# Patient Record
Sex: Female | Born: 1975 | Hispanic: No | Smoking: Former smoker
Health system: Southern US, Community
[De-identification: ages and names within clinical notes are randomized; demographics above are authoritative.]

## PROBLEM LIST (undated history)

## (undated) DIAGNOSIS — R87619 Unspecified abnormal cytological findings in specimens from cervix uteri: Secondary | ICD-10-CM

## (undated) DIAGNOSIS — Q068 Other specified congenital malformations of spinal cord: Secondary | ICD-10-CM

## (undated) DIAGNOSIS — R87611 Atypical squamous cells cannot exclude high grade squamous intraepithelial lesion on cytologic smear of cervix (ASC-H): Secondary | ICD-10-CM | POA: Insufficient documentation

## (undated) DIAGNOSIS — B977 Papillomavirus as the cause of diseases classified elsewhere: Secondary | ICD-10-CM

## (undated) DIAGNOSIS — D649 Anemia, unspecified: Secondary | ICD-10-CM

## (undated) DIAGNOSIS — C539 Malignant neoplasm of cervix uteri, unspecified: Secondary | ICD-10-CM

## (undated) DIAGNOSIS — B009 Herpesviral infection, unspecified: Secondary | ICD-10-CM

## (undated) DIAGNOSIS — Z87440 Personal history of urinary (tract) infections: Secondary | ICD-10-CM

## (undated) DIAGNOSIS — E538 Deficiency of other specified B group vitamins: Secondary | ICD-10-CM

## (undated) DIAGNOSIS — Q057 Lumbar spina bifida without hydrocephalus: Secondary | ICD-10-CM

## (undated) HISTORY — DX: Herpesviral infection, unspecified: B00.9

## (undated) HISTORY — DX: Deficiency of other specified B group vitamins: E53.8

## (undated) HISTORY — DX: Malignant neoplasm of cervix uteri, unspecified: C53.9

## (undated) HISTORY — PX: BLADDER SURGERY: SHX569

## (undated) HISTORY — PX: EXPLORATORY LAPAROTOMY: SUR591

## (undated) HISTORY — PX: LEEP: SHX91

## (undated) HISTORY — DX: Unspecified abnormal cytological findings in specimens from cervix uteri: R87.619

## (undated) HISTORY — DX: Other specified congenital malformations of spinal cord: Q06.8

## (undated) HISTORY — DX: Papillomavirus as the cause of diseases classified elsewhere: B97.7

## (undated) HISTORY — PX: COLOSTOMY: SHX63

## (undated) HISTORY — DX: Personal history of urinary (tract) infections: Z87.440

## (undated) HISTORY — DX: Lumbar spina bifida without hydrocephalus: Q05.7

## (undated) HISTORY — PX: BACK SURGERY: SHX140

## (undated) HISTORY — PX: OTHER SURGICAL HISTORY: SHX169

## (undated) HISTORY — PX: TUBAL LIGATION: SHX77

## (undated) HISTORY — DX: Anemia, unspecified: D64.9

---

## 2001-02-23 ENCOUNTER — Other Ambulatory Visit: Admission: RE | Admit: 2001-02-23 | Discharge: 2001-02-23 | Payer: Self-pay

## 2001-02-23 ENCOUNTER — Other Ambulatory Visit: Admission: RE | Admit: 2001-02-23 | Discharge: 2001-02-23 | Payer: Self-pay | Admitting: *Deleted

## 2007-01-23 ENCOUNTER — Emergency Department: Payer: Self-pay

## 2007-03-15 ENCOUNTER — Encounter: Payer: Self-pay | Admitting: Maternal & Fetal Medicine

## 2007-04-14 ENCOUNTER — Encounter: Payer: Self-pay | Admitting: Maternal & Fetal Medicine

## 2007-04-15 ENCOUNTER — Encounter: Payer: Self-pay | Admitting: Maternal & Fetal Medicine

## 2007-08-09 ENCOUNTER — Encounter: Payer: Self-pay | Admitting: Maternal & Fetal Medicine

## 2007-09-01 ENCOUNTER — Inpatient Hospital Stay: Payer: Self-pay

## 2008-12-17 ENCOUNTER — Emergency Department (HOSPITAL_COMMUNITY): Admission: EM | Admit: 2008-12-17 | Discharge: 2008-12-18 | Payer: Self-pay | Admitting: Emergency Medicine

## 2009-01-10 ENCOUNTER — Other Ambulatory Visit: Admission: RE | Admit: 2009-01-10 | Discharge: 2009-01-10 | Payer: Self-pay | Admitting: Obstetrics and Gynecology

## 2009-05-08 ENCOUNTER — Emergency Department (HOSPITAL_COMMUNITY): Admission: EM | Admit: 2009-05-08 | Discharge: 2009-05-08 | Payer: Self-pay | Admitting: Emergency Medicine

## 2009-07-16 ENCOUNTER — Emergency Department (HOSPITAL_COMMUNITY): Admission: EM | Admit: 2009-07-16 | Discharge: 2009-07-16 | Payer: Self-pay | Admitting: Emergency Medicine

## 2009-07-23 ENCOUNTER — Emergency Department (HOSPITAL_COMMUNITY): Admission: EM | Admit: 2009-07-23 | Discharge: 2009-07-23 | Payer: Self-pay | Admitting: Emergency Medicine

## 2009-08-17 ENCOUNTER — Ambulatory Visit (HOSPITAL_COMMUNITY): Admission: RE | Admit: 2009-08-17 | Discharge: 2009-08-17 | Payer: Self-pay | Admitting: Obstetrics and Gynecology

## 2009-11-12 ENCOUNTER — Emergency Department (HOSPITAL_COMMUNITY): Admission: EM | Admit: 2009-11-12 | Discharge: 2009-11-13 | Payer: Self-pay | Admitting: Emergency Medicine

## 2009-11-13 ENCOUNTER — Ambulatory Visit (HOSPITAL_COMMUNITY): Admission: RE | Admit: 2009-11-13 | Discharge: 2009-11-13 | Payer: Self-pay | Admitting: Emergency Medicine

## 2010-01-28 ENCOUNTER — Ambulatory Visit: Payer: Self-pay | Admitting: Orthopedic Surgery

## 2010-01-28 DIAGNOSIS — M549 Dorsalgia, unspecified: Secondary | ICD-10-CM | POA: Insufficient documentation

## 2010-01-30 ENCOUNTER — Encounter: Payer: Self-pay | Admitting: Orthopedic Surgery

## 2010-02-06 ENCOUNTER — Telehealth: Payer: Self-pay | Admitting: Orthopedic Surgery

## 2010-02-06 ENCOUNTER — Encounter (HOSPITAL_COMMUNITY): Admission: RE | Admit: 2010-02-06 | Discharge: 2010-03-08 | Payer: Self-pay | Admitting: Orthopedic Surgery

## 2010-02-19 ENCOUNTER — Encounter: Payer: Self-pay | Admitting: Orthopedic Surgery

## 2010-03-12 ENCOUNTER — Encounter (HOSPITAL_COMMUNITY)
Admission: RE | Admit: 2010-03-12 | Discharge: 2010-04-11 | Payer: Self-pay | Source: Home / Self Care | Attending: Orthopedic Surgery | Admitting: Orthopedic Surgery

## 2010-03-13 ENCOUNTER — Ambulatory Visit: Payer: Self-pay | Admitting: Orthopedic Surgery

## 2010-03-29 ENCOUNTER — Telehealth: Payer: Self-pay | Admitting: Orthopedic Surgery

## 2010-04-18 ENCOUNTER — Encounter: Payer: Self-pay | Admitting: Orthopedic Surgery

## 2010-04-23 ENCOUNTER — Ambulatory Visit
Admission: RE | Admit: 2010-04-23 | Discharge: 2010-04-23 | Payer: Self-pay | Source: Home / Self Care | Attending: Orthopedic Surgery | Admitting: Orthopedic Surgery

## 2010-04-23 ENCOUNTER — Emergency Department (HOSPITAL_COMMUNITY)
Admission: EM | Admit: 2010-04-23 | Discharge: 2010-04-23 | Payer: Self-pay | Source: Home / Self Care | Admitting: Emergency Medicine

## 2010-04-23 DIAGNOSIS — M5126 Other intervertebral disc displacement, lumbar region: Secondary | ICD-10-CM | POA: Insufficient documentation

## 2010-04-29 ENCOUNTER — Encounter (INDEPENDENT_AMBULATORY_CARE_PROVIDER_SITE_OTHER): Payer: Self-pay | Admitting: *Deleted

## 2010-04-29 LAB — URINALYSIS, ROUTINE W REFLEX MICROSCOPIC
Bilirubin Urine: NEGATIVE
Hgb urine dipstick: NEGATIVE
Ketones, ur: NEGATIVE mg/dL
Nitrite: NEGATIVE
Protein, ur: NEGATIVE mg/dL
Specific Gravity, Urine: 1.01 (ref 1.005–1.030)
Urine Glucose, Fasting: NEGATIVE mg/dL
pH: 6.5 (ref 5.0–8.0)

## 2010-05-02 ENCOUNTER — Ambulatory Visit (HOSPITAL_COMMUNITY)
Admission: RE | Admit: 2010-05-02 | Discharge: 2010-05-02 | Payer: Self-pay | Source: Home / Self Care | Attending: Orthopedic Surgery | Admitting: Orthopedic Surgery

## 2010-05-08 ENCOUNTER — Ambulatory Visit
Admission: RE | Admit: 2010-05-08 | Discharge: 2010-05-08 | Payer: Self-pay | Source: Home / Self Care | Attending: Orthopedic Surgery | Admitting: Orthopedic Surgery

## 2010-05-08 DIAGNOSIS — Q068 Other specified congenital malformations of spinal cord: Secondary | ICD-10-CM | POA: Insufficient documentation

## 2010-05-08 DIAGNOSIS — Q079 Congenital malformation of nervous system, unspecified: Secondary | ICD-10-CM | POA: Insufficient documentation

## 2010-05-13 ENCOUNTER — Encounter: Payer: Self-pay | Admitting: Orthopedic Surgery

## 2010-05-14 NOTE — Letter (Signed)
Summary: History form  History form   Imported By: Jacklynn Ganong 01/30/2010 08:35:55  _____________________________________________________________________  External Attachment:    Type:   Image     Comment:   External Document

## 2010-05-14 NOTE — Medication Information (Signed)
Summary: Tax adviser   Imported By: Cammie Sickle 01/29/2010 18:41:37  _____________________________________________________________________  External Attachment:    Type:   Image     Comment:   External Document

## 2010-05-14 NOTE — Miscellaneous (Signed)
Summary: Physical therapy order  Physical therapy order   Imported By: Cammie Sickle 01/29/2010 18:42:21  _____________________________________________________________________  External Attachment:    Type:   Image     Comment:   External Document

## 2010-05-14 NOTE — Assessment & Plan Note (Signed)
Summary: RE-CK LOW BACK/FOL'G PT/CA MCD/CAF   Visit Type:  Follow-up  CC:  back pain.  History of Present Illness: I saw Melissa Pruitt in the office today for a followup visit.  She is a 35 years old woman with the complaint of:  back pain.  Medications: Naproxen.  Still in PT.  c/o occasional bilateral leg pain with a tingling feeling in the feet   with Neg SLR's and normal reflexes   I will like her to finish her therapy. I saw the notes today. She is making some improvement, but is unclear to me at this time. I should continues to have a tingling feeling with normal neurologic exam.  If no improvement when she comes back next time. We will get an MRI. I did start her on some tramadol again do not feel narcotics necessary  Allergies: No Known Drug Allergies   Medications Added to Medication List This Visit: 1)  Tramadol Hcl 50 Mg Tabs (Tramadol hcl) .Marland Kitchen.. 1 by mouth q 4 as needed pain  Other Orders: Est. Patient Level II (30865)  Patient Instructions: 1)  Finish PT and return in 1 month  2)  Please schedule a follow-up appointment in 1 month. Prescriptions: TRAMADOL HCL 50 MG TABS (TRAMADOL HCL) 1 by mouth q 4 as needed pain  #60 x 5   Entered and Authorized by:   Fuller Canada MD   Signed by:   Fuller Canada MD on 03/13/2010   Method used:   Handwritten   RxID:   [id number]    Orders Added: 1)  Est. Patient Level II [40102]

## 2010-05-14 NOTE — Assessment & Plan Note (Signed)
Summary: [facility] ER FOL/UP/RT SIDE LOW BACK/NO XRAYS @AP /US,CT/MEDICAID/CAF   Visit Type:  new patient  CC:  low back pain.  History of Present Illness: this is the first visit to our office for the above named patient who she presented to the ER complaining of 2-1/2 years of back pain with bilateral leg pain radiating down into her feet.  She was able to tolerate things until she had her child and then things got worse.  During her pregnancy her pain increased but it was not reduced to the pregnancy itself.  However after the pregnancy was over she still had the symptoms and they worsen.  She now complains of 9/10 sharp dull throbbing stabbing burning constant pain which is worse with walking, bending over, lying down, driving sitting or standing.  Her pain is associated with numbness tingling a catching sensation when she flexes her back and occasional swelling.  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    Allergies (verified): No Known Drug Allergies  Past History:  Past Surgical History: Back surgery Bladder Colostomy Tubes tied Removal of cervical cancer  Review of Systems Constitutional:  Denies weight loss, weight gain, fever, chills, and fatigue. Cardiovascular:  Denies chest pain, palpitations, fainting, and murmurs. Respiratory:  Denies short of breath, wheezing, couch, tightness, pain on inspiration, and snoring . Gastrointestinal:  Denies heartburn, nausea, vomiting, diarrhea, constipation, and blood in your stools. Genitourinary:  Complains of flank pain; denies frequency, urgency, difficulty urinating, painful urination, and bleeding  in urine; right side. Neurologic:  Complains of numbness, tingling, and unsteady gait; denies dizziness, tremors, and seizure. Musculoskeletal:  Complains of instability and muscle pain; denies joint pain, swelling, stiffness, redness, and heat. Endocrine:  Denies excessive thirst, exessive urination, and heat or cold intolerance. Psychiatric:  Denies nervousness, depression, anxiety, and hallucinations. Skin:  Denies changes in the skin, poor healing, rash, itching, and redness. HEENT:  Denies blurred or double vision, eye pain, redness, and watering. Immunology:  Complains of seasonal allergies; denies sinus problems and allergic to bee stings; lactose intolerant. Hemoatologic:  Denies easy bleeding and brusing.  Physical Exam  Additional Exam:  GEN: well developed, well nourished, normal grooming and hygiene, no deformity and extremely small body habitus  Vital signs are normal.  CDV: pulses are normal, no edema, no erythema. no tenderness  Lymph: normal lymph nodes   Skin: no rashes, skin lesions or open sores   NEURO: normal coordination, reflexes, sensation.   Psyche: awake, alert and oriented. Mood normal   Gait: normal  She has a transverse incision in the lower back with a tender area where the incision is.  She has normal hip flexion bilaterally knee flexion ankle flexion and she has normal stability to her lower extremities.  Motor exam normal.  I do not feel a step-off or defect at the incision but it is tender.  When we tried to do straight leg raises she had pain in her back but not in her legs and it wasn't radicular   Impression & Recommendations:  Problem # 1:  BACK PAIN (ICD-724.5) Assessment New  patient status  post some type of meningocele type surgery is a 77-month-old child presents with bilateral leg pain numbness catching.  X-rays were taken I do not see a defect.  I think she should see a neurosurgeon so we will do the therapy start her on  anti-inflammatories and then get the MRI per Medicaid protocol.  AP lateral and spot films of the lumbar spine show some abnormality to the sagittal alignment of the spine but nothing that I would say scoliosis there is some suggestion that some type of surgery may have been done at L5-S1 but it does not appear to be a defect or spina bifida.  Strongly advised not to give this patient pain medication with narcotics.  Orders: New Patient Level III (16109) Lumbosacral Spine ,2/3 views (72100)  Patient Instructions: 1)  Physical Therapy  2)  return 6 weeks  3)  Nsaids x 6 weeks    Orders Added: 1)  New Patient Level III [60454] 2)  Lumbosacral Spine ,2/3 views [72100]

## 2010-05-14 NOTE — Progress Notes (Signed)
Summary: wants prescription for mild pain medicine  Phone Note Call from Patient   Summary of Call: Melissa Pruitt (20-Jul-1975) has ben taking Naproxen since  01/28/10 and it has not helped her pain.  Had PT evaluation today and is in alot of pain now.  Can you prescribe a mild pain medicine.  Uses  [facility] in [place]? Her # [phone] Initial call taken by: Jacklynn Ganong,  February 06, 2010 12:31 PM  Follow-up for Phone Call        try tylenol 1000 mg three times a day  Follow-up by: Fuller Canada MD,  February 07, 2010 8:22 AM  Additional Follow-up for Phone Call Additional follow up Details #1::        Advised the patient of doctor's reply Additional Follow-up by: Jacklynn Ganong,  February 07, 2010 8:28 AM

## 2010-05-14 NOTE — Miscellaneous (Signed)
Summary: PT progress note  PT progress note   Imported By: Jacklynn Ganong 03/14/2010 08:41:24  _____________________________________________________________________  External Attachment:    Type:   Image     Comment:   External Document

## 2010-05-14 NOTE — Miscellaneous (Signed)
Summary: PT initial evaluation  PT initial evaluation   Imported By: Jacklynn Ganong 02/19/2010 15:28:46  _____________________________________________________________________  External Attachment:    Type:   Image     Comment:   External Document

## 2010-05-16 NOTE — Assessment & Plan Note (Signed)
Summary: 1 mth follow-up after PT/medicaid/wkj   Visit Type:  Follow-up  CC:  back pain recheck.  History of Present Illness: I saw Melissa Pruitt in the office today for a followup visit.  She is a 35 years old woman with the complaint of:  back pain.  Medications: Naproxen, Robaxin.  Today is a one month recheck after therapy.  Therapy helped her side area, she can stand up straight now.  The muscle relaxer helps some.  Still has pain down her right leg.   Has left leg tingling also in the thigh area.    Allergies (verified): 1)  ! Ultram  Past History:  Past Surgical History: Last updated: 01/28/2010 Back surgery Bladder Colostomy Tubes tied Removal of cervical cancer  Review of Systems      See HPI   Impression & Recommendations:  Problem # 1:  BACK PAIN (ICD-724.5)  Her updated medication list for this problem includes:    Tramadol Hcl 50 Mg Tabs (Tramadol hcl) .Marland Kitchen... 1 by mouth q 4 as needed pain    Robaxin 500 Mg Tabs (Methocarbamol) .Marland Kitchen... 1 by mouth q 6 hrs as needed    Flexeril 10 Mg Tabs (Cyclobenzaprine hcl) .Marland Kitchen... 1 by mouth q 4 as needed pain  Orders: Est. Patient Level III (04540)  Problem # 2:  H N P-LUMBAR (ICD-722.10) Assessment: New  recommend MRI to evaluate persistent tingling and numbness in her legs with history of congenital spinal abnormality and failure of resolution with physical therapy, anti-inflammatories, and muscle relaxers  Orders: Est. Patient Level III (98119)  Medications Added to Medication List This Visit: 1)  Flexeril 10 Mg Tabs (Cyclobenzaprine hcl) .Marland Kitchen.. 1 by mouth q 4 as needed pain  Patient Instructions: 1)  MRI L SPINE  2)  CONTINUE MEDS AS NEEDED  3)  RETURN AFTER MRI  Prescriptions: FLEXERIL 10 MG TABS (CYCLOBENZAPRINE HCL) 1 by mouth Q 4 as needed PAIN  #60 x 1   Entered and Authorized by:   Fuller Canada MD   Signed by:   Fuller Canada MD on 04/23/2010   Method used:   Print then Give to Patient   RxID:   [id number]    Orders Added: 1)  Est. Patient Level III [84696]

## 2010-05-16 NOTE — Miscellaneous (Signed)
Summary: MRI appt 05/02/10  reg 830am aph results appt 05/08/10 3pm  Clinical Lists Changes        Precert number [id number] expires 05/29/10 for [place] medicaid, fu here for results on 05/08/10 at 3pm, advised to bring disc.[email or web address]

## 2010-05-16 NOTE — Miscellaneous (Signed)
Summary: PT discharge summary  PT discharge summary   Imported By: Jacklynn Ganong 04/18/2010 15:20:02  _____________________________________________________________________  External Attachment:    Type:   Image     Comment:   External Document

## 2010-05-16 NOTE — Progress Notes (Signed)
Summary: [facility] to [facility]  Phone Note Call from Patient   Caller: Patient Summary of Call: Patient stopped in office fol'g physical therapy & relayed that therapist mentioned that they need to a myofacial release each time at therapy, and mentioned she may want to request from Dr Romeo Apple a mild muscle relaxer.  Patient said had tried Tramadol and felt it was too strong and caused side effects.  Please advise re: mild muscle relaxer.  Her pharmacy is [facility] in [place].  Her ph # is [phone].  Initial call taken by: Cammie Sickle,  March 29, 2010 9:55 AM  Follow-up for Phone Call        [facility] to [facility] in [place] Follow-up by: Ether Griffins,  March 29, 2010 9:54 AM    New/Updated Medications: ROBAXIN 500 MG TABS (METHOCARBAMOL) 1 by mouth q 6 hrs as needed Prescriptions: ROBAXIN 500 MG TABS (METHOCARBAMOL) 1 by mouth q 6 hrs as needed  #60 x 1   Entered by:   Ether Griffins   Authorized by:   Fuller Canada MD   Signed by:   Ether Griffins on 03/29/2010   Method used:   Faxed to ...       [facility] (retail)       [address]       [place], [place]  [address]       Ph: [phone]       Fax: [phone]   RxID:   [id number]

## 2010-05-16 NOTE — Assessment & Plan Note (Signed)
Summary: mri results L spine [facility]/to bring [email or web address]   Visit Type:  Follow-up  CC:  back pain with tingling in her legs.  History of Present Illness: I saw Melissa Pruitt in the office today for a followup visit.  She is a 35 years old woman with the complaint of:  back pain and tingling in both legs   ROS neuro symptoms in the feet with tingling and spasms   Medications: Tramadol Hcl 50 Mg Tabs (Tramadol hcl) .Marland Kitchen... 1 by mouth q 4 as needed pain    Robaxin 500 Mg Tabs (Methocarbamol) .Marland Kitchen... 1 by mouth q 6 hrs as needed    Flexeril 10 Mg Tabs (Cyclobenzaprine hcl) .Marland Kitchen... 1 by mouth q 4 as needed pain  MRI results:  IMPRESSION:   1.  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 2.  Negative for degenerative disc disease.  Central canal and foramina are widely patent all levels.   Read By:  Charyl Dancer,  M.D.     Complaints: She states that she is still hurting, but the Flexeril helps better the Robaxin.  Allergies: 1)  ! 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   She continues to have symptoms of numbness and tingling in both legs and feet. Despite cord signal normal.  I think at minimum she should have a neurosurgical consult to see if the tethered cord and spinal dysraphism is a cause of her symptoms.  She is advised to continue Naprosyn and Flexeril.  Orders: Est. 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 Patient Level III (60454)  Other Orders: Neurosurgeon Referral (Neurosurgeon)  Patient Instructions: 1)  Neurosurgeon referral   Orders Added: 1)  Neurosurgeon Referral [Neurosurgeon] 2)  Est. Patient Level III [09811]

## 2010-05-22 NOTE — Miscellaneous (Signed)
Summary: [facility] referral  Clinical Lists Changes  Orders: Added new Referral order of Orthopedic Surgeon Referral (Ortho Surgeon) - Signed

## 2010-05-27 ENCOUNTER — Telehealth: Payer: Self-pay | Admitting: Orthopedic Surgery

## 2010-06-05 NOTE — Progress Notes (Signed)
Summary: Referrasl to [facility] for her back.  Phone Note Outgoing Call   Call placed by: Waldon Reining,  May 27, 2010 12:30 PM Call placed to: Specialist Action Taken: Information Sent Summary of Call: I faxed a referral for this patient to [facility] to be seen for her back because [facility] would not see her because of her Spina Bifida.

## 2010-06-12 ENCOUNTER — Other Ambulatory Visit: Payer: Self-pay | Admitting: Obstetrics and Gynecology

## 2010-06-12 ENCOUNTER — Other Ambulatory Visit (HOSPITAL_COMMUNITY)
Admission: RE | Admit: 2010-06-12 | Discharge: 2010-06-12 | Disposition: A | Discharge status: Home / Self Care | Payer: Medicaid Other | Source: Ambulatory Visit | Attending: Obstetrics and Gynecology | Admitting: Obstetrics and Gynecology

## 2010-06-12 DIAGNOSIS — Z01419 Encounter for gynecological examination (general) (routine) without abnormal findings: Secondary | ICD-10-CM | POA: Insufficient documentation

## 2010-06-19 ENCOUNTER — Encounter: Payer: Self-pay | Admitting: Orthopedic Surgery

## 2010-06-27 ENCOUNTER — Encounter: Payer: Self-pay | Admitting: Orthopedic Surgery

## 2010-06-28 LAB — URINALYSIS, ROUTINE W REFLEX MICROSCOPIC
Bilirubin Urine: NEGATIVE
Glucose, UA: NEGATIVE mg/dL
Hgb urine dipstick: NEGATIVE
Ketones, ur: NEGATIVE mg/dL
Nitrite: NEGATIVE
Protein, ur: NEGATIVE mg/dL
Specific Gravity, Urine: 1.01 (ref 1.005–1.030)
Urobilinogen, UA: 0.2 mg/dL (ref 0.0–1.0)
pH: 6 (ref 5.0–8.0)

## 2010-06-28 LAB — CBC
HCT: 36.6 % (ref 36.0–46.0)
Hemoglobin: 12.5 g/dL (ref 12.0–15.0)
MCH: 32.3 pg (ref 26.0–34.0)
MCHC: 34.2 g/dL (ref 30.0–36.0)
MCV: 94.4 fL (ref 78.0–100.0)
Platelets: 179 10*3/uL (ref 150–400)
RBC: 3.88 MIL/uL (ref 3.87–5.11)
RDW: 13.4 % (ref 11.5–15.5)
WBC: 7.7 10*3/uL (ref 4.0–10.5)

## 2010-06-28 LAB — LIPASE, BLOOD: Lipase: 40 U/L (ref 11–59)

## 2010-06-28 LAB — URINE CULTURE
Colony Count: NO GROWTH
Culture  Setup Time: 201108021944
Culture: NO GROWTH

## 2010-06-28 LAB — DIFFERENTIAL
Basophils Absolute: 0.1 10*3/uL (ref 0.0–0.1)
Basophils Relative: 1 % (ref 0–1)
Eosinophils Absolute: 0.1 10*3/uL (ref 0.0–0.7)
Eosinophils Relative: 2 % (ref 0–5)
Lymphocytes Relative: 43 % (ref 12–46)
Lymphs Abs: 3.3 10*3/uL (ref 0.7–4.0)
Monocytes Absolute: 0.4 10*3/uL (ref 0.1–1.0)
Monocytes Relative: 5 % (ref 3–12)
Neutro Abs: 3.8 10*3/uL (ref 1.7–7.7)
Neutrophils Relative %: 49 % (ref 43–77)

## 2010-06-28 LAB — COMPREHENSIVE METABOLIC PANEL
ALT: 12 U/L (ref 0–35)
AST: 14 U/L (ref 0–37)
Albumin: 4.3 g/dL (ref 3.5–5.2)
Alkaline Phosphatase: 31 U/L — ABNORMAL LOW (ref 39–117)
BUN: 6 mg/dL (ref 6–23)
CO2: 29 mEq/L (ref 19–32)
Calcium: 9.3 mg/dL (ref 8.4–10.5)
Chloride: 103 mEq/L (ref 96–112)
Creatinine, Ser: 0.61 mg/dL (ref 0.4–1.2)
GFR calc Af Amer: >60 mL/min (ref >60)
GFR calc non Af Amer: >60 mL/min (ref >60)
Glucose, Bld: 93 mg/dL (ref 70–99)
Potassium: 3.3 mEq/L — ABNORMAL LOW (ref 3.5–5.1)
Sodium: 137 mEq/L (ref 135–145)
Total Bilirubin: 0.8 mg/dL (ref 0.3–1.2)
Total Protein: 6.4 g/dL (ref 6.0–8.3)

## 2010-06-28 LAB — POCT PREGNANCY, URINE: Preg Test, Ur: NEGATIVE

## 2010-06-30 LAB — URINALYSIS, ROUTINE W REFLEX MICROSCOPIC
Bilirubin Urine: NEGATIVE
Glucose, UA: NEGATIVE mg/dL
Hgb urine dipstick: NEGATIVE
Ketones, ur: NEGATIVE mg/dL
Nitrite: POSITIVE — AB
Protein, ur: NEGATIVE mg/dL
Specific Gravity, Urine: 1.01 (ref 1.005–1.030)
Urobilinogen, UA: 0.2 mg/dL (ref 0.0–1.0)
pH: 8 (ref 5.0–8.0)

## 2010-06-30 LAB — URINE MICROSCOPIC-ADD ON

## 2010-06-30 LAB — PREGNANCY, URINE: Preg Test, Ur: NEGATIVE

## 2010-06-30 LAB — RAPID STREP SCREEN (MED CTR MEBANE ONLY): Streptococcus, Group A Screen (Direct): NEGATIVE

## 2010-07-02 LAB — CBC
HCT: 37.2 % (ref 36.0–46.0)
Hemoglobin: 13.1 g/dL (ref 12.0–15.0)
MCHC: 35.1 g/dL (ref 30.0–36.0)
MCV: 92.3 fL (ref 78.0–100.0)
Platelets: 195 10*3/uL (ref 150–400)
RBC: 4.03 MIL/uL (ref 3.87–5.11)
RDW: 13.3 % (ref 11.5–15.5)
WBC: 8.8 10*3/uL (ref 4.0–10.5)

## 2010-07-02 LAB — BASIC METABOLIC PANEL
BUN: 8 mg/dL (ref 6–23)
CO2: 27 mEq/L (ref 19–32)
Calcium: 9.3 mg/dL (ref 8.4–10.5)
Chloride: 101 mEq/L (ref 96–112)
Creatinine, Ser: 0.68 mg/dL (ref 0.4–1.2)
GFR calc Af Amer: >60 mL/min (ref >60)
GFR calc non Af Amer: >60 mL/min (ref >60)
Glucose, Bld: 134 mg/dL — ABNORMAL HIGH (ref 70–99)
Potassium: 4.1 mEq/L (ref 3.5–5.1)
Sodium: 135 mEq/L (ref 135–145)

## 2010-07-02 LAB — HCG, QUANTITATIVE, PREGNANCY: hCG, Beta Chain, Quant, S: <2 m[IU]/mL (ref <5)

## 2010-07-02 NOTE — Letter (Signed)
Summary: Medical records request [facility]  Medical records request [facility]   Imported By: Cammie Sickle 06/26/2010 18:46:36  _____________________________________________________________________  External Attachment:    Type:   Image     Comment:   External Document

## 2010-07-11 NOTE — Letter (Signed)
Summary: Medical record request Disability determination  Medical record request Disability determination   Imported By: Cammie Sickle 07/01/2010 22:08:11  _____________________________________________________________________  External Attachment:    Type:   Image     Comment:   External Document

## 2010-07-19 LAB — CBC
MCHC: 35 g/dL (ref 30.0–36.0)
MCV: 93.8 fL (ref 78.0–100.0)
Platelets: 243 10*3/uL (ref 150–400)
RBC: 3.82 MIL/uL — ABNORMAL LOW (ref 3.87–5.11)
RDW: 13 % (ref 11.5–15.5)

## 2010-07-19 LAB — COMPREHENSIVE METABOLIC PANEL
AST: 15 U/L (ref 0–37)
CO2: 29 mEq/L (ref 19–32)
Calcium: 9 mg/dL (ref 8.4–10.5)
Creatinine, Ser: 0.6 mg/dL (ref 0.4–1.2)
GFR calc Af Amer: >60 mL/min (ref >60)
GFR calc non Af Amer: >60 mL/min (ref >60)
Sodium: 136 mEq/L (ref 135–145)
Total Protein: 6.6 g/dL (ref 6.0–8.3)

## 2010-07-19 LAB — URINALYSIS, ROUTINE W REFLEX MICROSCOPIC
Bilirubin Urine: NEGATIVE
Glucose, UA: NEGATIVE mg/dL
Hgb urine dipstick: NEGATIVE
Nitrite: POSITIVE — AB
Protein, ur: NEGATIVE mg/dL
Specific Gravity, Urine: 1.01 (ref 1.005–1.030)
Urobilinogen, UA: 0.2 mg/dL (ref 0.0–1.0)
pH: 7.5 (ref 5.0–8.0)

## 2010-07-19 LAB — DIFFERENTIAL
Eosinophils Relative: 1 % (ref 0–5)
Lymphocytes Relative: 25 % (ref 12–46)
Lymphs Abs: 2 10*3/uL (ref 0.7–4.0)
Monocytes Relative: 4 % (ref 3–12)
Neutrophils Relative %: 70 % (ref 43–77)

## 2010-07-19 LAB — URINE MICROSCOPIC-ADD ON

## 2010-07-19 LAB — URINE CULTURE: Colony Count: >=100000

## 2010-07-19 LAB — LIPASE, BLOOD: Lipase: 35 U/L (ref 11–59)

## 2010-07-19 LAB — PREGNANCY, URINE: Preg Test, Ur: NEGATIVE

## 2010-07-25 ENCOUNTER — Other Ambulatory Visit (HOSPITAL_COMMUNITY): Payer: Self-pay | Admitting: "Endocrinology

## 2010-07-25 DIAGNOSIS — R52 Pain, unspecified: Secondary | ICD-10-CM

## 2010-07-25 DIAGNOSIS — R51 Headache: Secondary | ICD-10-CM

## 2010-07-25 DIAGNOSIS — C539 Malignant neoplasm of cervix uteri, unspecified: Secondary | ICD-10-CM

## 2010-07-29 ENCOUNTER — Ambulatory Visit (HOSPITAL_COMMUNITY): Admission: RE | Admit: 2010-07-29 | Payer: Medicaid Other | Source: Ambulatory Visit

## 2010-07-29 ENCOUNTER — Ambulatory Visit (HOSPITAL_COMMUNITY): Payer: Medicaid Other

## 2010-07-30 ENCOUNTER — Other Ambulatory Visit (HOSPITAL_COMMUNITY): Payer: Self-pay | Admitting: "Endocrinology

## 2010-07-30 DIAGNOSIS — R51 Headache: Secondary | ICD-10-CM

## 2010-08-01 ENCOUNTER — Ambulatory Visit (HOSPITAL_COMMUNITY): Payer: Medicaid Other

## 2010-08-01 ENCOUNTER — Ambulatory Visit (HOSPITAL_COMMUNITY): Admission: RE | Admit: 2010-08-01 | Payer: Medicaid Other | Source: Ambulatory Visit

## 2010-08-08 ENCOUNTER — Ambulatory Visit (HOSPITAL_COMMUNITY)
Admission: RE | Admit: 2010-08-08 | Discharge: 2010-08-08 | Disposition: A | Discharge status: Home / Self Care | Payer: Medicaid Other | Source: Ambulatory Visit | Attending: "Endocrinology | Admitting: "Endocrinology

## 2010-08-08 ENCOUNTER — Encounter (HOSPITAL_COMMUNITY): Payer: Self-pay

## 2010-08-08 DIAGNOSIS — M542 Cervicalgia: Secondary | ICD-10-CM | POA: Insufficient documentation

## 2010-08-08 DIAGNOSIS — R51 Headache: Secondary | ICD-10-CM

## 2010-08-08 DIAGNOSIS — R1319 Other dysphagia: Secondary | ICD-10-CM | POA: Insufficient documentation

## 2010-08-08 MED ORDER — IOHEXOL 300 MG/ML  SOLN
75.0000 mL | Freq: Once | INTRAMUSCULAR | Status: AC | PRN
  Administered 2010-08-08: 75 mL via INTRAVENOUS

## 2010-12-11 ENCOUNTER — Ambulatory Visit: Payer: Medicaid Other | Admitting: Gastroenterology

## 2010-12-18 ENCOUNTER — Ambulatory Visit (INDEPENDENT_AMBULATORY_CARE_PROVIDER_SITE_OTHER): Payer: Medicaid Other | Admitting: Gastroenterology

## 2010-12-18 ENCOUNTER — Encounter: Payer: Self-pay | Admitting: Gastroenterology

## 2010-12-18 VITALS — BP 107/60 | HR 76 | Temp 97.7°F | Ht 64.0 in | Wt 99.0 lb

## 2010-12-18 DIAGNOSIS — Q057 Lumbar spina bifida without hydrocephalus: Secondary | ICD-10-CM

## 2010-12-18 NOTE — Patient Instructions (Signed)
We will hold off on any invasive procedures for now.  If you have any changes in your condition, please notify our office. If you notice difficulty swallowing, significant change in bowel habits, rectal bleeding, etc., please contact us.  Otherwise, continue with the plan of seeing the specialists at [facility]. It was great to meet you, and we look forward to working with you in the future if needed.

## 2010-12-18 NOTE — Progress Notes (Signed)
Primary Care Physician:  Purcell Nails, MD Primary Gastroenterologist:  Dr. Darrick Penna  Chief Complaint  Patient presents with  . Diarrhea    problems with spine that is causes the diarrhea    HPI:   Ms. Melissa Pruitt is a 35 year old female who is quite aware of her medical history and seems very knowledgeable about her current bowel functioning. 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 She also had a flex sig as a child due to bowel habits, had what sounds like a perforation requiring exploratory laparotomy, colostomy placement, and later colostomy reversal. Due to her history, she has no sphincter control reportedly. She also has difficulty urinating and will need to eventually proceed with self-catheterizations in the future. She has an appointment at [facility] on the 28th of this month. She has been seen at [facility] in the past. She has undergone core training in the past.  She presents today at the request of her PCP due to dysphagia and loose stools. However, after discussion with patient, she only has loose stools if she ingests milk products or greasy foods. Her baseline BMs vary depending on her diet. However, she will drink coffee to facilitate a bowel movement. She denies any melena or brbpr. She denies any reflux symptoms currently; she only had reflux while pregnant. She stated that she ate too fast a few times while getting the girls ready for school and had difficulty swallowing. This has not been a trend, and she denies any odynophagia. She also denies any abdominal pain, nausea or vomiting.   She does state she is OCD and counts steps in her house. She also counts the number of times she chews. She states her PCP was concerned about her weight, but she appears healthy and not cachectic. As mentioned earlier, she is quite knowledgeable about her health and has a large 3-ring-binder with her containing all of her medical records from birth.     In am: smoothie with organic pb, homemade almond milk, cup of coffee. Will drink more coffee if needed, makes go to bathroom.  11am: [employer]ashi strawberry fiber bar  Lunch: can of tuna, strained black beans, tomatoes diced, celery, carrots, raspberry dressing  3pm: bagel  Supper: chicken, [place], or fish               Salad               Bread, alternating vegetables  Past Medical History  Diagnosis Date  . Cervical cancer     removed in July 35  . 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sacro/coccygeal mass excision    Past Surgical History  Procedure Date  . Back surgery   . Bladder surgery   . Colostomy     after flex sig as 35 year old.   . Tubal ligation   . Removal of cervical cancer   . Colostomy reversal   . Exploratory laparotomy     with colostomy placement    Current Outpatient Prescriptions  Medication Sig Dispense Refill  . cyclobenzaprine (FLEXERIL) 10 MG tablet Take 10 mg by mouth every 4 (four) hours as needed. 1 by mouth q4 hours as needed for pain       . diclofenac sodium (VOLTAREN) 1 % GEL Apply 1 application topically.        Marland Kitchen HYDROcodone-acetaminophen (VICODIN) 5-500 MG per tablet Take 1 tablet by mouth every 6 (six) hours as needed.        Marland Kitchen  lidocaine (LIDODERM) 5 % Place 1 patch onto the skin daily. Remove & Discard patch within 12 hours or as directed by MD       . loratadine (CLARITIN) 10 MG tablet Take 10 mg by mouth daily.        . naproxen (NAPROSYN) 500 MG tablet Take 500 mg by mouth 2 (two) times daily with a meal.        . methocarbamol (ROBAXIN) 500 MG tablet Take 500 mg by mouth every 6 (six) hours. 1 by mouth q6 hours as needed       . traMADol (ULTRAM) 50 MG tablet Take 50 mg by mouth every 4 (four) hours. 1 by mouth q4 as needed pain         Allergies as of 12/18/2010 - Review Complete 12/18/2010  Allergen Reaction Noted  . Tramadol hcl      Family History  Problem Relation Age of Onset  . Colon cancer Neg Hx     History    Social History  . Marital Status: Legally Separated    Spouse Name: N/A    Number of Children: 3  . Years of Education: N/A   Occupational History  . disability Other   Social History Main Topics  . Smoking status: Current Some Day Smoker    Types: Cigarettes  . Smokeless tobacco: Not on file   Comment: 1-3 cigarettes  . Alcohol Use: Yes     a beer here and there  . Drug Use: No  . Sexually Active: Not on file   Other Topics Concern  . Not on file   Social History Narrative  . No narrative on file    Review of Systems: Gen: Denies any fever, chills, fatigue, weight loss, lack of appetite.  CV: Denies chest pain, heart palpitations, peripheral edema, syncope.  Resp: Denies shortness of breath at rest or with exertion. Denies wheezing or cough.  GI: Denies dysphagia or odynophagia. Denies jaundice, hematemesis, fecal incontinence. GU : Denies urinary burning, urinary frequency, urinary hesitancy MS: Denies joint pain, muscle weakness, cramps. Does experience limitation of movement in right leg as well as neuropathy. Derm: Denies rash, itching, dry skin Psych: Denies depression, anxiety, memory loss, and confusion Heme: Denies bruising, bleeding, and enlarged lymph nodes.  Physical Exam: BP 107/60  Pulse 76  Temp(Src) 97.7 F (36.5 C) (Temporal)  Ht 5\' 4"  (1.626 m)  Wt 99 lb (44.906 kg)  BMI 16.99 kg/m2 General:   Alert and oriented. Pleasant and cooperative. Well-nourished and well-developed.  Head:  Normocephalic and atraumatic. Eyes:  Without icterus, sclera clear and conjunctiva pink.  Ears:  Normal auditory acuity. Nose:  No deformity, discharge,  or lesions. Mouth:  No deformity or lesions, oral mucosa pink.  Neck:  Supple, without mass or thyromegaly. Lungs:  Clear to auscultation bilaterally. No wheezes, rales, or rhonchi. No distress.  Heart:  S1, S2 present without murmurs appreciated.  Abdomen:  +BS, soft, non-tender and non-distended. No HSM noted.  No guarding or rebound. Small umbilical hernia noted. No masses appreciated.  Rectal:  Deferred  Msk:  Symmetrical without gross deformities. Normal posture. Right leg weakness, uses cane.  Extremities:  Without clubbing or edema. Neurologic:  Alert and  oriented x4;  grossly normal neurologically. Skin:  Intact without significant lesions or rashes. Cervical Nodes:  No significant cervical adenopathy. Psych:  Alert and cooperative. Normal mood and affect.

## 2010-12-23 DIAGNOSIS — Q057 Lumbar spina bifida without hydrocephalus: Secondary | ICD-10-CM | POA: Insufficient documentation

## 2010-12-23 NOTE — Progress Notes (Signed)
Cc to PCP 

## 2010-12-23 NOTE — Assessment & Plan Note (Signed)
34 year old female with remote history of surgery secondary to above. Residual deficits due to nerve dysfunction remain. She will be seeing the spina bifida clinic in [place] on the 28th. This is an excellent option for her at this time. She is quite aware of the foods/diet that causes loose stools or constipation. As mentioned above in the HPI, she is eating healthy foods and following a strict diet to ensure adequate bowel function. She has had no melena or brbpr. Denies abdominal pain, N/V. At this time, no need for colonoscopy. I have encouraged her to contact our office if she experiences any issues such as rectal bleeding, change in bowel habits, etc. As for dysphagia, this seems to be an isolated incidence that occurred with eating too quickly. She has had no further issues, and she denies any odynophagia, reflux, nausea or vomiting. Again, she was informed of the signs and symptoms to report in the future, and we would gladly see her again. In the meantime, she was encouraged to follow-up with Kendell Bane at the spina bifida clinic. This will likely be the best resource for her at this time. No need for endoscopy/colonoscopy as this time unless warranted in the future. Return as needed.

## 2010-12-26 ENCOUNTER — Ambulatory Visit: Payer: Medicaid Other | Admitting: Physical Medicine & Rehabilitation

## 2011-01-06 NOTE — Progress Notes (Signed)
Pt has an eating disorder & OCD. She should see a mental health specialist.

## 2011-01-13 ENCOUNTER — Ambulatory Visit: Payer: Medicaid Other | Admitting: Physical Medicine & Rehabilitation

## 2011-04-29 DIAGNOSIS — S1093XA Contusion of unspecified part of neck, initial encounter: Secondary | ICD-10-CM | POA: Insufficient documentation

## 2011-04-29 DIAGNOSIS — M25519 Pain in unspecified shoulder: Secondary | ICD-10-CM | POA: Insufficient documentation

## 2011-04-29 DIAGNOSIS — W010XXA Fall on same level from slipping, tripping and stumbling without subsequent striking against object, initial encounter: Secondary | ICD-10-CM | POA: Insufficient documentation

## 2011-04-29 DIAGNOSIS — Z8541 Personal history of malignant neoplasm of cervix uteri: Secondary | ICD-10-CM | POA: Insufficient documentation

## 2011-04-29 DIAGNOSIS — Z79899 Other long term (current) drug therapy: Secondary | ICD-10-CM | POA: Insufficient documentation

## 2011-04-29 DIAGNOSIS — S0003XA Contusion of scalp, initial encounter: Secondary | ICD-10-CM | POA: Insufficient documentation

## 2011-04-29 DIAGNOSIS — M542 Cervicalgia: Secondary | ICD-10-CM | POA: Insufficient documentation

## 2011-04-30 ENCOUNTER — Encounter (HOSPITAL_COMMUNITY): Payer: Self-pay | Admitting: Emergency Medicine

## 2011-04-30 ENCOUNTER — Emergency Department (HOSPITAL_COMMUNITY)
Admission: EM | Admit: 2011-04-30 | Discharge: 2011-04-30 | Disposition: A | Discharge status: Home / Self Care | Payer: Medicaid Other | Attending: Emergency Medicine | Admitting: Emergency Medicine

## 2011-04-30 ENCOUNTER — Emergency Department (HOSPITAL_COMMUNITY): Payer: Medicaid Other

## 2011-04-30 DIAGNOSIS — S1093XA Contusion of unspecified part of neck, initial encounter: Secondary | ICD-10-CM

## 2011-04-30 MED ORDER — HYDROCODONE-ACETAMINOPHEN 5-325 MG PO TABS: 1.0000 | ORAL_TABLET | Freq: Four times a day (QID) | ORAL | Status: AC | PRN

## 2011-04-30 MED ORDER — HYDROCODONE-ACETAMINOPHEN 5-325 MG PO TABS
1.0000 | ORAL_TABLET | Freq: Once | ORAL | Status: AC
  Administered 2011-04-30: 1 via ORAL
  Filled 2011-04-30: qty 1

## 2011-04-30 NOTE — ED Notes (Signed)
Pt stable at discharge Neuro WNL. Pt ambulatory with cane at baseline. No numbness or complaints per pt

## 2011-04-30 NOTE — ED Notes (Signed)
Patient states she fell and hit the right side of her neck on desk, hit head on desk and left shoulder on corner of desk.

## 2011-04-30 NOTE — ED Provider Notes (Signed)
History     CSN: [id number]  Arrival date & time 04/29/11  2351   First MD Initiated Contact with Patient 04/30/11 0029      Chief Complaint  Patient presents with  . Fall    (Consider location/radiation/quality/duration/timing/severity/associated sxs/prior treatment) HPI This is a 36 year old white female who was working at her computer yesterday morning about 11 AM. She was leaning on a keyboard on a shelf on the keyboard gave way and she fell. When she fell she struck the anterior and right lateral aspects of her neck against the edge of the desk. She is having moderate to severe pain in her neck, worse with movement or palpation. The pain is primarily  Anterior laterally is noted. She also complains of mild pain in her shoulders anteriorly where she struck them.she was placed in a [place] cervical collar on arrival.  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 She has limited ability to empty her bowel and bladder. She also has chronic laxity of the right shoulder joint. None of these have acutely changed as a result of the fall.  Past Medical History  Diagnosis Date  . Cervical cancer     removed in July 2009  . 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sacro/coccygeal mass excision    Past Surgical History  Procedure Date  . Back surgery   . Bladder surgery   . Colostomy     after flex sig as 36 year old.   . Tubal ligation   . Removal of cervical cancer   . Colostomy reversal   . Exploratory laparotomy     with colostomy placement    Family History  Problem Relation Age of Onset  . Colon cancer Neg Hx     History  Substance Use Topics  . Smoking status: Current Some Day Smoker    Types: Cigarettes  . Smokeless tobacco: Not on file   Comment: 1-3 cigarettes  . Alcohol Use: Yes     a beer here and there    OB History    Grav Para Term Preterm  Abortions TAB SAB Ect Mult Living                  Review of Systems  All other systems reviewed and are negative.    Allergies  Dilaudid; Sulfa antibiotics; and Tramadol hcl  Home Medications   Current Outpatient Rx  Name Route Sig Dispense Refill  . CYCLOBENZAPRINE HCL 10 MG PO TABS Oral Take 10 mg by mouth every 4 (four) hours as needed. 1 by mouth q4 hours as needed for pain     . DICLOFENAC SODIUM 1 % TD GEL Topical Apply 1 application topically.      Marland Kitchen LIDOCAINE 5 % EX PTCH Transdermal Place 1 patch onto the skin daily. Remove & Discard patch within 12 hours or as directed by MD     . LORATADINE 10 MG PO TABS Oral Take 10 mg by mouth daily.      Marland Kitchen HYDROCODONE-ACETAMINOPHEN 5-500 MG PO TABS Oral Take 1 tablet by mouth every 6 (six) hours as needed.      . METHOCARBAMOL 500 MG PO TABS Oral Take 500 mg by mouth every 6 (six) hours. 1 by mouth q6 hours as needed     . NAPROXEN 500 MG PO TABS Oral Take 500 mg by mouth 2 (two) times  daily with a meal.      . TRAMADOL HCL 50 MG PO TABS Oral Take 50 mg by mouth every 4 (four) hours. 1 by mouth q4 as needed pain       BP 124/77  Pulse 88  Temp(Src) 98.7 F (37.1 C) (Oral)  Resp 18  Ht 5\' 4"  (1.626 m)  Wt 100 lb (45.36 kg)  BMI 17.17 kg/m2  SpO2 100%  LMP 04/04/2011  Physical Exam General: Well-developed, well-nourished female in no acute distress; appearance consistent with age of record HENT: normocephalic, atraumatic Eyes: pupils equal round and reactive to light; extraocular muscles intact Neck: immobilized in cervical collar; trachea midline without dysphonia, crepitus or significant tenderness; tenderness of the soft tissue of the right anterior neck and paraspinal regions Heart: regular rate and rhythm Lungs: clear to auscultation bilaterally Abdomen: soft; nondistended Extremities: No deformity; full range of motion; pulses normal; laxity of the right shoulder joint Neurologic: Awake, alert and oriented; motor  function intact in all extremities with the exception of the toes of the right foot Skin: Warm and dry Psychiatric: Normal mood and affect    ED Course  Procedures (including critical care time)    MDM   Nursing notes and vitals signs, including pulse oximetry, reviewed.  Summary of this visit's results, reviewed by myself:   Imaging Studies: Dg Cervical Spine Complete  04/30/2011  *RADIOLOGY REPORT*  Clinical Data: Fall, right neck pain  CERVICAL SPINE - COMPLETE 4+ VIEW  Comparison: 08/08/2010  Findings: Straightened alignment of the cervical spine, similar to the prior study.  No compression fracture, wedge shaped deformity or focal kyphosis.  Preserved vertebral body heights and disc spaces.  Normal prevertebral soft tissues.  Facets aligned. Foramina appear patent.  Intact odontoid.  IMPRESSION: Stable exam.  No acute finding.  Original Report Authenticated By: Judie Petit. Ruel Favors, M.D.     Hanley Seamen, MD 04/30/11 [phone]

## 2011-06-25 DIAGNOSIS — N319 Neuromuscular dysfunction of bladder, unspecified: Secondary | ICD-10-CM | POA: Insufficient documentation

## 2011-07-06 ENCOUNTER — Encounter (HOSPITAL_COMMUNITY): Payer: Self-pay | Admitting: *Deleted

## 2011-07-06 ENCOUNTER — Emergency Department (HOSPITAL_COMMUNITY)
Admission: EM | Admit: 2011-07-06 | Discharge: 2011-07-06 | Disposition: A | Discharge status: Home / Self Care | Payer: Medicaid Other | Attending: Emergency Medicine | Admitting: Emergency Medicine

## 2011-07-06 DIAGNOSIS — H9209 Otalgia, unspecified ear: Secondary | ICD-10-CM | POA: Insufficient documentation

## 2011-07-06 DIAGNOSIS — R29898 Other symptoms and signs involving the musculoskeletal system: Secondary | ICD-10-CM | POA: Insufficient documentation

## 2011-07-06 DIAGNOSIS — R209 Unspecified disturbances of skin sensation: Secondary | ICD-10-CM | POA: Insufficient documentation

## 2011-07-06 DIAGNOSIS — J3489 Other specified disorders of nose and nasal sinuses: Secondary | ICD-10-CM | POA: Insufficient documentation

## 2011-07-06 DIAGNOSIS — R51 Headache: Secondary | ICD-10-CM | POA: Insufficient documentation

## 2011-07-06 DIAGNOSIS — H6691 Otitis media, unspecified, right ear: Secondary | ICD-10-CM

## 2011-07-06 DIAGNOSIS — H6690 Otitis media, unspecified, unspecified ear: Secondary | ICD-10-CM | POA: Insufficient documentation

## 2011-07-06 DIAGNOSIS — R109 Unspecified abdominal pain: Secondary | ICD-10-CM | POA: Insufficient documentation

## 2011-07-06 DIAGNOSIS — N390 Urinary tract infection, site not specified: Secondary | ICD-10-CM

## 2011-07-06 LAB — DIFFERENTIAL
Basophils Absolute: 0 10*3/uL (ref 0.0–0.1)
Eosinophils Relative: 1 % (ref 0–5)
Lymphocytes Relative: 30 % (ref 12–46)
Neutro Abs: 4.8 10*3/uL (ref 1.7–7.7)
Neutrophils Relative %: 63 % (ref 43–77)

## 2011-07-06 LAB — BASIC METABOLIC PANEL
CO2: 29 mEq/L (ref 19–32)
Calcium: 9.1 mg/dL (ref 8.4–10.5)
Chloride: 100 mEq/L (ref 96–112)
Sodium: 135 mEq/L (ref 135–145)

## 2011-07-06 LAB — URINALYSIS, ROUTINE W REFLEX MICROSCOPIC
Glucose, UA: NEGATIVE mg/dL
Ketones, ur: NEGATIVE mg/dL
Protein, ur: NEGATIVE mg/dL

## 2011-07-06 LAB — URINE MICROSCOPIC-ADD ON

## 2011-07-06 LAB — LACTIC ACID, PLASMA: Lactic Acid, Venous: 0.7 mmol/L (ref 0.5–2.2)

## 2011-07-06 LAB — CBC
Platelets: 204 10*3/uL (ref 150–400)
RDW: 12.6 % (ref 11.5–15.5)
WBC: 7.6 10*3/uL (ref 4.0–10.5)

## 2011-07-06 MED ORDER — SODIUM CHLORIDE 0.9 % IV BOLUS (SEPSIS)
1000.0000 mL | Freq: Once | INTRAVENOUS | Status: AC
  Administered 2011-07-06: 1000 mL via INTRAVENOUS

## 2011-07-06 MED ORDER — AMOXICILLIN-POT CLAVULANATE 875-125 MG PO TABS: 1.0000 | ORAL_TABLET | Freq: Two times a day (BID) | ORAL | Status: AC

## 2011-07-06 MED ORDER — DEXTROSE 5 % IV SOLN
1.0000 g | Freq: Once | INTRAVENOUS | Status: AC
  Administered 2011-07-06: 1 g via INTRAVENOUS
  Filled 2011-07-06: qty 10

## 2011-07-06 MED ORDER — HYDROCODONE-ACETAMINOPHEN 5-325 MG PO TABS: 1.0000 | ORAL_TABLET | ORAL | Status: AC | PRN

## 2011-07-06 MED ORDER — IBUPROFEN 400 MG PO TABS
400.0000 mg | ORAL_TABLET | Freq: Once | ORAL | Status: AC
  Administered 2011-07-06: 400 mg via ORAL
  Filled 2011-07-06: qty 1

## 2011-07-06 NOTE — ED Notes (Signed)
Pt presents with UTI symptoms.Rt sided flank pain. Urine has a foul smelling urine per pt. 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 Pt also c/o sinus infection symptoms. Sinus pressure and rt earache.

## 2011-07-06 NOTE — ED Notes (Signed)
Pt. Stated she usually catheterizes self at home, has has repetitive UTI in the past. Verbalized that urine was yellow, cloudy, and had foul smell.

## 2011-07-06 NOTE — ED Provider Notes (Signed)
History     CSN: [id number]  Arrival date & time 07/06/11  1318   First MD Initiated Contact with Patient 07/06/11 1334      Chief Complaint  Patient presents with  . Urinary Tract Infection  . Sinusitis  . Flank Pain    (Consider location/radiation/quality/duration/timing/severity/associated sxs/prior treatment) HPI  Past Medical History  Diagnosis Date  . Cervical cancer     removed in July 2009  . 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sacro/coccygeal mass excision    Past Surgical History  Procedure Date  . Back surgery   . Bladder surgery   . Colostomy     after flex sig as 36 year old.   . Tubal ligation   . Removal of cervical cancer   . Colostomy reversal   . Exploratory laparotomy     with colostomy placement    Family History  Problem Relation Age of Onset  . Colon cancer Neg Hx     History  Substance Use Topics  . Smoking status: Current Some Day Smoker    Types: Cigarettes  . Smokeless tobacco: Not on file   Comment: 1-3 cigarettes  . Alcohol Use: Yes     a beer here and there    OB History    Grav Para Term Preterm Abortions TAB SAB Ect Mult Living                  Review of Systems  Allergies  Dilaudid; Sulfa antibiotics; and Tramadol hcl  Home Medications   Current Outpatient Rx  Name Route Sig Dispense Refill  . CYCLOBENZAPRINE HCL 10 MG PO TABS Oral Take 10 mg by mouth every 4 (four) hours as needed. 1 by mouth q4 hours as needed for pain     . LIDOCAINE 5 % EX PTCH Transdermal Place 1 patch onto the skin daily. Remove & Discard patch within 12 hours or as directed by MD     . LORATADINE 10 MG PO TABS Oral Take 10 mg by mouth daily.      . AMOXICILLIN-POT CLAVULANATE 875-125 MG PO TABS Oral Take 1 tablet by mouth 2 (two) times daily. One po bid x 7 days 14 tablet 0  . DICLOFENAC SODIUM 1 % TD GEL Topical Apply 1 application topically.      Marland Kitchen HYDROCODONE-ACETAMINOPHEN 5-325 MG PO TABS Oral Take 1-2 tablets by mouth every 4  (four) hours as needed for pain. 15 tablet 0    BP 97/45  Pulse 73  Temp(Src) 98.4 F (36.9 C) (Oral)  Resp 18  Ht 5\' 4"  (1.626 m)  Wt 105 lb (47.628 kg)  BMI 18.02 kg/m2  SpO2 98%  LMP 06/09/2011  Physical Exam  ED Course  Procedures (including critical care time)  Labs Reviewed  URINALYSIS, ROUTINE W REFLEX MICROSCOPIC - Abnormal; Notable for the following:    Hgb urine dipstick TRACE (*)    All other components within normal limits  CBC - Abnormal; Notable for the following:    RBC 3.86 (*)    HCT 35.4 (*)    All other components within normal limits  URINE MICROSCOPIC-ADD ON - Abnormal; Notable for the following:    Squamous Epithelial / LPF FEW (*)    Bacteria, UA MANY (*)    All other components within normal limits  DIFFERENTIAL  BASIC METABOLIC PANEL  LACTIC ACID, PLASMA  URINE CULTURE   No results found.   1. Urinary tract infection   2.  Right otitis media       MDM  Patient rechecked at 1745:  Feeling much better. Alert. Good color. Discussed urinary tract infection and right ear infection. Will Rx Augmentin and Vicodin. Patient improved the discharge        Donnetta Hutching, MD 07/06/11 1758

## 2011-07-06 NOTE — ED Provider Notes (Signed)
History   This chart was scribed for Joya Gaskins, MD by Melba Coon. The patient was seen in room APA10/APA10 and the patient's care was started at 1:44PM.    CSN: [id number]  Arrival date & time 07/06/11  1318   First MD Initiated Contact with Patient 07/06/11 1334      Chief Complaint  Patient presents with  . Urinary Tract Infection  . Sinusitis  . Flank Pain     HPI Melissa Pruitt is a 36 y.o. female who presents to the Emergency Department complaining of constant, moderate to severe right-sided flank pain pertaining to UTI symptoms with an onset a month ago. Pt has had a UTI 2 months ago for which she took ampicillin; hasn't taken abx since; pt states present episode is similar to last episode. Urine has a foul smelling, cloudy urine. Pt self-catheterizes  BID due to h/o myelomeningocele at birth (had surgery) also has chronic weakness in right LE that is not new  She also reports sinus HA and pressure, right otalgia, nasal congestion present for several weeks. No vision changes, fever, CP, abd pain or n/v/d. Pt has baseline hypotension and baseliine numbness on the right side of her leg. She feels her course is worsening  Past Medical History  Diagnosis Date  . Cervical cancer     removed in July 2009  . 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sacro/coccygeal mass excision    Past Surgical History  Procedure Date  . Back surgery   . Bladder surgery   . Colostomy     after flex sig as 36 year old.   . Tubal ligation   . Removal of cervical cancer   . Colostomy reversal   . Exploratory laparotomy     with colostomy placement    Family History  Problem Relation Age of Onset  . Colon cancer Neg Hx     History  Substance Use Topics  . Smoking status: Current Some Day Smoker    Types: Cigarettes  . Smokeless tobacco: Not on file   Comment: 1-3 cigarettes  . Alcohol Use: Yes     a beer here and there    OB History    Grav Para Term Preterm  Abortions TAB SAB Ect Mult Living                  Review of Systems 10 Systems reviewed and are negative for acute change except as noted in the HPI.  Allergies  Dilaudid; Sulfa antibiotics; and Tramadol hcl  Home Medications   Current Outpatient Rx  Name Route Sig Dispense Refill  . CYCLOBENZAPRINE HCL 10 MG PO TABS Oral Take 10 mg by mouth every 4 (four) hours as needed. 1 by mouth q4 hours as needed for pain     . DICLOFENAC SODIUM 1 % TD GEL Topical Apply 1 application topically.      Marland Kitchen HYDROCODONE-ACETAMINOPHEN 5-500 MG PO TABS Oral Take 1 tablet by mouth every 6 (six) hours as needed.      Marland Kitchen LIDOCAINE 5 % EX PTCH Transdermal Place 1 patch onto the skin daily. Remove & Discard patch within 12 hours or as directed by MD     . LORATADINE 10 MG PO TABS Oral Take 10 mg by mouth daily.      Marland Kitchen METHOCARBAMOL 500 MG PO TABS Oral Take 500 mg by mouth every 6 (six) hours. 1 by mouth q6 hours as needed     .  NAPROXEN 500 MG PO TABS Oral Take 500 mg by mouth 2 (two) times daily with a meal.      . TRAMADOL HCL 50 MG PO TABS Oral Take 50 mg by mouth every 4 (four) hours. 1 by mouth q4 as needed pain       BP 87/69  Pulse 86  Temp(Src) 98.4 F (36.9 C) (Oral)  Resp 20  Ht 5\' 4"  (1.626 m)  Wt 105 lb (47.628 kg)  BMI 18.02 kg/m2  SpO2 100%  LMP 06/09/2011  Physical Exam CONSTITUTIONAL: Well developed/well nourished HEAD AND FACE: Normocephalic/atraumatic EYES: EOMI/PERRL ENMT: Mucous membranes moist, left TM/right TM normal, ears symmetric, no ear drainage noted NECK: supple no meningeal signs SPINE:entire spine nontender. No bruising noted CV: S1/S2 noted, no murmurs/rubs/gallops noted LUNGS: Lungs are clear to auscultation bilaterally, no apparent distress ABDOMEN: soft, nontender, no rebound or guarding ZO:XWRUE cva tenderness NEURO: Pt is awake/alert, has numbness/weakness with right LE hip flexion (baseline) EXTREMITIES: pulses normal, full ROM SKIN: warm, color  normal PSYCH: no abnormalities of mood noted  ED Course  Procedures   DIAGNOSTIC STUDIES: Oxygen Saturation is 100% on room air, normal by my interpretation.    COORDINATION OF CARE:  1:49PM - EDMD will order abx and urine workup for the pt.  4:16 PM Pt with continued hypotension, but denies weakness. She is afebrile Previous records reveal BP in low 100s and pt reports h/o SBP in 90s, so not far from baseline Will give IV fluids and check lactate.  If normal and BP improved she will be stable for d/c D/w dr Adriana Simas to f/u on results    MDM  Nursing notes reviewed and considered in documentation All labs/vitals reviewed and considered Previous records reviewed and considered   I personally performed the services described in this documentation, which was scribed in my presence. The recorded information has been reviewed and considered.          Joya Gaskins, MD 07/06/11 [phone]

## 2011-07-06 NOTE — Discharge Instructions (Signed)
Antibiotic for urinary tract infection and ear infection.  Also pain medication. Increase fluids. Followup your Dr.

## 2011-07-11 LAB — URINE CULTURE
Colony Count: >=100000
Culture  Setup Time: 201303242310

## 2011-07-12 NOTE — ED Notes (Signed)
+  Urine. Patient given Augmentin. Resistant. Chart sent to EDP office for review. °

## 2011-07-14 NOTE — ED Notes (Signed)
Rx for doxycyline 100 mg bid x 7 days #14

## 2011-07-15 NOTE — ED Notes (Signed)
Pt returned call,  Pt given results of urine culture and pt told us to call in Rx to [facility] in [place] (@ [phone]). Called in Doxycyline 100mg  PO BID x 7 days, quant 14, per PA C. Mayford Knife,

## 2011-07-18 ENCOUNTER — Ambulatory Visit (HOSPITAL_COMMUNITY)
Admission: RE | Admit: 2011-07-18 | Discharge: 2011-07-18 | Disposition: A | Discharge status: Home / Self Care | Payer: Medicaid Other | Source: Ambulatory Visit | Attending: Internal Medicine | Admitting: Internal Medicine

## 2011-07-18 ENCOUNTER — Other Ambulatory Visit (HOSPITAL_COMMUNITY): Payer: Self-pay | Admitting: Internal Medicine

## 2011-07-18 DIAGNOSIS — J329 Chronic sinusitis, unspecified: Secondary | ICD-10-CM | POA: Insufficient documentation

## 2011-09-14 ENCOUNTER — Encounter (HOSPITAL_COMMUNITY): Payer: Self-pay

## 2011-09-14 ENCOUNTER — Emergency Department (HOSPITAL_COMMUNITY): Payer: Medicaid Other

## 2011-09-14 ENCOUNTER — Emergency Department (HOSPITAL_COMMUNITY)
Admission: EM | Admit: 2011-09-14 | Discharge: 2011-09-14 | Disposition: A | Discharge status: Home / Self Care | Payer: Medicaid Other | Attending: Emergency Medicine | Admitting: Emergency Medicine

## 2011-09-14 DIAGNOSIS — R0602 Shortness of breath: Secondary | ICD-10-CM | POA: Insufficient documentation

## 2011-09-14 DIAGNOSIS — M542 Cervicalgia: Secondary | ICD-10-CM | POA: Insufficient documentation

## 2011-09-14 DIAGNOSIS — R079 Chest pain, unspecified: Secondary | ICD-10-CM

## 2011-09-14 LAB — BASIC METABOLIC PANEL
BUN: 10 mg/dL (ref 6–23)
CO2: 25 mEq/L (ref 19–32)
Glucose, Bld: 99 mg/dL (ref 70–99)
Potassium: 3.8 mEq/L (ref 3.5–5.1)
Sodium: 138 mEq/L (ref 135–145)

## 2011-09-14 LAB — CBC
Hemoglobin: 12.1 g/dL (ref 12.0–15.0)
MCV: 90.8 fL (ref 78.0–100.0)
Platelets: 243 10*3/uL (ref 150–400)
RBC: 3.93 MIL/uL (ref 3.87–5.11)
WBC: 8.9 10*3/uL (ref 4.0–10.5)

## 2011-09-14 LAB — DIFFERENTIAL
Lymphocytes Relative: 31 % (ref 12–46)
Lymphs Abs: 2.8 10*3/uL (ref 0.7–4.0)
Monocytes Relative: 6 % (ref 3–12)
Neutrophils Relative %: 61 % (ref 43–77)

## 2011-09-14 MED ORDER — HYDROCODONE-ACETAMINOPHEN 5-325 MG PO TABS: 1.0000 | ORAL_TABLET | Freq: Four times a day (QID) | ORAL | Status: AC | PRN

## 2011-09-14 MED ORDER — HYDROCODONE-ACETAMINOPHEN 5-325 MG PO TABS
1.0000 | ORAL_TABLET | Freq: Once | ORAL | Status: AC
  Administered 2011-09-14: 1 via ORAL
  Filled 2011-09-14: qty 1

## 2011-09-14 NOTE — ED Notes (Signed)
Pt has been having left side and left jaw pain for 2 days off/on, sob that started today. Thinks may be side effect from birth control.  Lightheaded x2 weeks

## 2011-09-14 NOTE — ED Provider Notes (Signed)
History    This chart was scribed for [employer]. Rubin Payor, MD, MD by Smitty Pluck. The patient was seen in room APA08 and the patient's care was started at 6:20PM.   CSN: [id number]  Arrival date & time 09/14/11  1750   First MD Initiated Contact with Patient 09/14/11 1811      Chief Complaint  Patient presents with  . Chest Pain    (Consider location/radiation/quality/duration/timing/severity/associated sxs/prior treatment) Patient is a 36 y.o. female presenting with chest pain. The history is provided by the patient.  Chest Pain Primary symptoms include shortness of breath. Pertinent negatives for primary symptoms include no fever, no nausea and no vomiting.    Melissa Pruitt is a 36 y.o. female who presents to the Emergency Department complaining of moderate ongoing chest pain and left jaw pain onset 2 days ago. Pt reports that she has new birth control and thinks that the symptoms are side effects of the medication. Pt reports that she recently stopped smoking. She states she has had sinus infections and URI within past couple of weeks. She states that she has been in the process of moving and she has noticed that when she was not at home her breathing was better. Reports her home has mold. She states she has noticed increased fluid retention. Pt reports pain in her leg. Symptoms have been intermittent without radiation.    Past Medical History  Diagnosis Date  . Cervical cancer     removed in July 2009  . 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sacro/coccygeal mass excision    Past Surgical History  Procedure Date  . Back surgery   . Bladder surgery   . Colostomy     after flex sig as 36 year old.   . Tubal ligation   . Removal of cervical cancer   . Colostomy reversal   . Exploratory laparotomy     with colostomy placement    Family History  Problem Relation Age of Onset  . Colon cancer Neg Hx     History  Substance Use Topics  . Smoking status: Current Some  Day Smoker    Types: Cigarettes  . Smokeless tobacco: Not on file   Comment: 1-3 cigarettes  . Alcohol Use: Yes     a beer here and there    OB History    Grav Para Term Preterm Abortions TAB SAB Ect Mult Living                  Review of Systems  Constitutional: Negative for fever and chills.  Respiratory: Positive for shortness of breath.   Cardiovascular: Positive for chest pain.  Gastrointestinal: Negative for nausea, vomiting and diarrhea.    Allergies  Dilaudid; Sulfa antibiotics; and Tramadol hcl  Home Medications   Current Outpatient Rx  Name Route Sig Dispense Refill  . CYCLOBENZAPRINE HCL 10 MG PO TABS Oral Take 10 mg by mouth every 4 (four) hours as needed. 1 by mouth q4 hours as needed for pain     . DICLOFENAC SODIUM 1 % TD GEL Topical Apply 1 application topically 4 (four) times daily as needed. arthritis    . LEVONORGEST-ETH ESTRAD 91-DAY 0.15-0.03 MG PO TABS Oral Take 1 tablet by mouth daily.    Marland Kitchen LIDOCAINE 5 % EX PTCH Transdermal Place 1 patch onto the skin daily. Remove & Discard patch within 12 hours or as directed by MD     . LORATADINE 10 MG PO TABS  Oral Take 10 mg by mouth daily.      Marland Kitchen HYDROCODONE-ACETAMINOPHEN 5-325 MG PO TABS Oral Take 1 tablet by mouth every 6 (six) hours as needed for pain. 10 tablet 0    BP 128/68  Pulse 88  Temp(Src) 98.3 F (36.8 C) (Oral)  Resp 20  Ht 5\' 4"  (1.626 m)  Wt 115 lb (52.164 kg)  BMI 19.74 kg/m2  SpO2 98%  LMP 08/24/2011  Physical Exam  Nursing note and vitals reviewed. Constitutional: She is oriented to person, place, and time. She appears well-developed and well-nourished. No distress.  HENT:  Head: Normocephalic and atraumatic.  Eyes: Conjunctivae are normal. Pupils are equal, round, and reactive to light.  Cardiovascular: Normal rate, regular rhythm and normal heart sounds.  Exam reveals no gallop.   No murmur heard. Pulmonary/Chest: Effort normal and breath sounds normal. No respiratory distress.    Musculoskeletal: She exhibits tenderness (right trapezius). She exhibits no edema.  Neurological: She is alert and oriented to person, place, and time.  Skin: Skin is warm and dry.  Psychiatric: She has a normal mood and affect. Her behavior is normal.    ED Course  Procedures (including critical care time) DIAGNOSTIC STUDIES: Oxygen Saturation is 98% on room air, normal by my interpretation.    COORDINATION OF CARE: 6:28PM EDP discusses pt ED treatment with pt  7:32PM EDP rechecks pt and discusses lab results with pt. Pt reports having pain. EDP decides to order medication: Norco 325 mg 1 tablet    Labs Reviewed  CBC - Abnormal; Notable for the following:    HCT 35.7 (*)    All other components within normal limits  DIFFERENTIAL  BASIC METABOLIC PANEL  D-DIMER, QUANTITATIVE   Dg Chest 2 View  09/14/2011  *RADIOLOGY REPORT*  Clinical Data: Left-sided chest and neck pain. Shortness of breath.  CHEST - 2 VIEW  Comparison: No priors.  Findings: Lungs appear slightly hyperexpanded with flattening of the hemidiaphragms, increased retrosternal air space and pruning of the pulmonary vasculature in the periphery, suggesting underlying COPD.  No consolidative airspace disease.  No definite pleural effusion.  Pulmonary vasculature and the cardiomediastinal silhouette are within normal limits.  No suspicious appearing pulmonary nodules or masses.  IMPRESSION: 1.  Appearance of the lungs suggests underlying COPD. Alternatively, this could suggest reactive airway disease. Clinical correlation is recommended.  Original Report Authenticated By: Florencia Reasons, M.D.     1. Chest pain   2. Neck pain     Date: 09/14/2011  Rate: 88  Rhythm: normal sinus rhythm  QRS Axis: normal  Intervals: normal  ST/T Wave abnormalities: normal  Conduction Disutrbances: none  Narrative Interpretation: unremarkable       MDM  Patient with left-sided chest pain radiating to her neck and down her left  side. Patient is worried about pulmonary embolism. She is on birth control. She states she is supposed to see a pulmonologist for some chronic lung problems. No fevers. No cough. She is tender over the trapezius. There is no peripheral edema. EKG is normal. D-dimer was done and was negative. I believe the she is low enough risk to rule out DVT and PE. She'll be discharged home with pain medicines to followup as needed.    I personally performed the services described in this documentation, which was scribed in my presence. The recorded information has been reviewed and considered. Juliet Rude. Rubin Payor, MD   Juliet Rude. Rubin Payor, MD 09/14/11 1955

## 2011-09-24 ENCOUNTER — Other Ambulatory Visit: Payer: Self-pay | Admitting: Adult Health

## 2011-10-06 ENCOUNTER — Other Ambulatory Visit (HOSPITAL_COMMUNITY): Payer: Self-pay | Admitting: Physical Medicine and Rehabilitation

## 2011-10-06 DIAGNOSIS — M5412 Radiculopathy, cervical region: Secondary | ICD-10-CM

## 2011-10-06 DIAGNOSIS — M47812 Spondylosis without myelopathy or radiculopathy, cervical region: Secondary | ICD-10-CM

## 2011-10-06 DIAGNOSIS — M542 Cervicalgia: Secondary | ICD-10-CM

## 2011-10-08 ENCOUNTER — Ambulatory Visit (HOSPITAL_COMMUNITY)
Admission: RE | Admit: 2011-10-08 | Discharge: 2011-10-08 | Disposition: A | Discharge status: Home / Self Care | Payer: Medicaid Other | Source: Ambulatory Visit | Attending: Physical Medicine and Rehabilitation | Admitting: Physical Medicine and Rehabilitation

## 2011-10-08 DIAGNOSIS — M5412 Radiculopathy, cervical region: Secondary | ICD-10-CM

## 2011-10-08 DIAGNOSIS — M47812 Spondylosis without myelopathy or radiculopathy, cervical region: Secondary | ICD-10-CM

## 2011-10-08 DIAGNOSIS — M5020 Other cervical disc displacement, unspecified cervical region: Secondary | ICD-10-CM | POA: Insufficient documentation

## 2011-10-08 DIAGNOSIS — M542 Cervicalgia: Secondary | ICD-10-CM | POA: Insufficient documentation

## 2011-10-08 DIAGNOSIS — Z8541 Personal history of malignant neoplasm of cervix uteri: Secondary | ICD-10-CM | POA: Insufficient documentation

## 2012-01-12 ENCOUNTER — Emergency Department (HOSPITAL_COMMUNITY)
Admission: EM | Admit: 2012-01-12 | Discharge: 2012-01-13 | Disposition: A | Discharge status: Home / Self Care | Payer: Medicaid Other | Attending: Emergency Medicine | Admitting: Emergency Medicine

## 2012-01-12 ENCOUNTER — Encounter (HOSPITAL_COMMUNITY): Payer: Self-pay | Admitting: *Deleted

## 2012-01-12 ENCOUNTER — Emergency Department (HOSPITAL_COMMUNITY): Payer: Medicaid Other

## 2012-01-12 DIAGNOSIS — Z87891 Personal history of nicotine dependence: Secondary | ICD-10-CM | POA: Insufficient documentation

## 2012-01-12 DIAGNOSIS — Z8541 Personal history of malignant neoplasm of cervix uteri: Secondary | ICD-10-CM | POA: Insufficient documentation

## 2012-01-12 DIAGNOSIS — R109 Unspecified abdominal pain: Secondary | ICD-10-CM

## 2012-01-12 DIAGNOSIS — K068 Other specified disorders of gingiva and edentulous alveolar ridge: Secondary | ICD-10-CM

## 2012-01-12 DIAGNOSIS — Z888 Allergy status to other drugs, medicaments and biological substances status: Secondary | ICD-10-CM | POA: Insufficient documentation

## 2012-01-12 DIAGNOSIS — K1370 Unspecified lesions of oral mucosa: Secondary | ICD-10-CM | POA: Insufficient documentation

## 2012-01-12 LAB — URINALYSIS, ROUTINE W REFLEX MICROSCOPIC
Glucose, UA: NEGATIVE mg/dL
Leukocytes, UA: NEGATIVE
Specific Gravity, Urine: 1.015 (ref 1.005–1.030)
pH: 7.5 (ref 5.0–8.0)

## 2012-01-12 MED ORDER — HYDROCODONE-ACETAMINOPHEN 5-325 MG PO TABS
1.0000 | ORAL_TABLET | Freq: Once | ORAL | Status: AC
  Administered 2012-01-13: 1 via ORAL
  Filled 2012-01-12: qty 1

## 2012-01-12 MED ORDER — HYDROCODONE-ACETAMINOPHEN 5-325 MG PO TABS: 1.0000 | ORAL_TABLET | Freq: Four times a day (QID) | ORAL | Status: AC | PRN

## 2012-01-12 MED ORDER — LIDOCAINE VISCOUS 2 % MT SOLN: OROMUCOSAL | Status: DC

## 2012-01-12 NOTE — ED Notes (Addendum)
Fell 2 days ago, pain low back .  Pain lt side of face. , ear "feels funny" pt has to self cath.

## 2012-01-12 NOTE — ED Provider Notes (Signed)
History    This chart was scribed for Hanley Seamen, MD, MD by Smitty Pluck. The patient was seen in room APA11 and the patient's care was started at 11:03PM.   CSN: [id number]  Arrival date & time 01/12/12  1849      Chief Complaint  Patient presents with  . Gum pain     (Consider location/radiation/quality/duration/timing/severity/associated sxs/prior treatment) The history is provided by the patient. No language interpreter was used.   Melissa Pruitt is a 36 y.o. female who presents to the Emergency Department complaining of constant, moderate top left oral pain onset 2 days ago. Pt reports that on 11-25-11 after having tooth next to her wisdom tooth pulled at dentist office. Since the tooth extraction she has had mild soreness but symptoms worsened 2 days ago. She has left ear pain. Pt reports that she fell 2 days ago. 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 She reports that she has lower back pain and had cath herself. She states that her urine was pungent. She has hx of UTI.    Past Medical History  Diagnosis Date  . 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sacro/coccygeal mass excision  . Cervical cancer     removed in July 2009    Past Surgical History  Procedure Date  . Back surgery   . Bladder surgery   . Colostomy     after flex sig as 36 year old.   . Tubal ligation   . Removal of cervical cancer   . Colostomy reversal   . Exploratory laparotomy     with colostomy placement    Family History  Problem Relation Age of Onset  . Colon cancer Neg Hx     History  Substance Use Topics  . Smoking status: Former Smoker    Types: Cigarettes  . Smokeless tobacco: Not on file   Comment: 1-3 cigarettes  . Alcohol Use: Yes     a beer here and there    OB History    Grav Para Term Preterm Abortions TAB SAB Ect Mult Living                  Review of Systems  All other systems reviewed and are negative.  10 Systems reviewed and all are negative for  acute change except as noted in the HPI.   Allergies  Dilaudid; Sulfa antibiotics; and Tramadol hcl  Home Medications   Current Outpatient Rx  Name Route Sig Dispense Refill  . CYCLOBENZAPRINE HCL 10 MG PO TABS Oral Take 10 mg by mouth every 4 (four) hours as needed. 1 by mouth q4 hours as needed for pain     . DICLOFENAC SODIUM 1 % TD GEL Topical Apply 1 application topically 4 (four) times daily as needed. arthritis    . LEVONORGEST-ETH ESTRAD 91-DAY 0.15-0.03 MG PO TABS Oral Take 1 tablet by mouth daily.    Marland Kitchen LIDOCAINE 5 % EX PTCH Transdermal Place 1 patch onto the skin daily. Remove & Discard patch within 12 hours or as directed by MD     . LORATADINE 10 MG PO TABS Oral Take 10 mg by mouth daily.        BP 117/58  Pulse 72  Temp 98.6 F (37 C) (Oral)  Resp 18  Ht 5\' 4"  (1.626 m)  Wt 114 lb (51.71 kg)  BMI 19.57 kg/m2  SpO2 100%  LMP 12/29/2011  Physical Exam  Nursing note and vitals reviewed.  Constitutional: She is oriented to person, place, and time. She appears well-developed and well-nourished.  HENT:  Head: Normocephalic and atraumatic.  Right Ear: External ear normal.  Left Ear: External ear normal.       Extraction socket where tooth was removed is healed but tender to palpation.  Small white bony bumps along gum of edge of left upper 2nd molar.   Eyes: Conjunctivae normal are normal. Pupils are equal, round, and reactive to light.  Cardiovascular: Normal rate, regular rhythm and normal heart sounds.   Pulmonary/Chest: Effort normal. No respiratory distress. She has no wheezes. She has no rales.  Abdominal: Soft. Bowel sounds are normal. She exhibits no distension. There is no tenderness. There is CVA tenderness (right). There is no rebound.  Neurological: She is alert and oriented to person, place, and time.  Skin: Skin is warm and dry.  Psychiatric: She has a normal mood and affect. Her behavior is normal.    ED Course  Procedures (including critical care  time) DIAGNOSTIC STUDIES: Oxygen Saturation is 100% on room air, normal by my interpretation.    COORDINATION OF CARE: 11:15 PM Discussed ED treatment with pt.     MDM   Nursing notes and vitals signs, including pulse oximetry, reviewed.  Summary of this visit's results, reviewed by myself:  Labs:  Results for orders placed during the hospital encounter of January 15, 2012  URINALYSIS, ROUTINE W REFLEX MICROSCOPIC      Component Value Range   Color, Urine YELLOW  YELLOW   APPearance CLEAR  CLEAR   Specific Gravity, Urine 1.015  1.005 - 1.030   pH 7.5  5.0 - 8.0   Glucose, UA NEGATIVE  NEGATIVE mg/dL   Hgb urine dipstick NEGATIVE  NEGATIVE   Bilirubin Urine NEGATIVE  NEGATIVE   Ketones, ur NEGATIVE  NEGATIVE mg/dL   Protein, ur NEGATIVE  NEGATIVE mg/dL   Urobilinogen, UA 0.2  0.0 - 1.0 mg/dL   Nitrite NEGATIVE  NEGATIVE   Leukocytes, UA NEGATIVE  NEGATIVE    Imaging Studies: Ct Maxillofacial Wo Cm  January 15, 2012  *RADIOLOGY REPORT*  Clinical Data: Left upper molar pain.  CT MAXILLOFACIAL WITHOUT CONTRAST  Technique:  Multidetector CT imaging of the maxillofacial structures was performed. Multiplanar CT image reconstructions were also generated.  Comparison: 08/08/2010 neck CT  Findings: Visualized intracranial contents are within normal limits.  Globes are symmetric.  Lenses are located.  No retrobulbar hematoma.  Paranasal sinuses and mastoid air cells are clear. Sinus walls, nasal septum, nasal bones, zygomatic arches, and pterygoid plates are intact.  The mandible is located.  Absent posterior most left maxillary molar.  Upper cervical spine within normal limits.  No prevertebral soft tissue swelling.  IMPRESSION: Absent posterior most left maxillary molar.  Correlate with odontogenic examination.  No associated fluid collection identified.  Otherwise, no acute abnormality identified by unenhanced maxillofacial CT.   Original Report Authenticated By: Waneta Martins, M.D.    We'll  treat pain and refer patient back to her dentist.   I personally performed the services described in this documentation, which was scribed in my presence.  The recorded information has been reviewed and considered.        Hanley Seamen, MD 15-Jan-2012 [phone]

## 2012-01-13 NOTE — ED Notes (Signed)
Pt discharged. Pt stable at time of discharge. Medications reviewed pt has no questions regarding discharge at this time. Pt voiced understanding of discharge instructions.  

## 2012-01-13 NOTE — ED Notes (Signed)
Sudie Grumbling from [facility] pharmacy called and reports they did not fill lidocaine prescription because was advised by the dentist that it would likely cause more irritation.

## 2012-03-17 ENCOUNTER — Other Ambulatory Visit (HOSPITAL_COMMUNITY)
Admission: RE | Admit: 2012-03-17 | Discharge: 2012-03-17 | Disposition: A | Discharge status: Home / Self Care | Payer: Medicaid Other | Source: Ambulatory Visit | Attending: Obstetrics and Gynecology | Admitting: Obstetrics and Gynecology

## 2012-03-17 ENCOUNTER — Other Ambulatory Visit: Payer: Self-pay | Admitting: Obstetrics and Gynecology

## 2012-03-17 DIAGNOSIS — Z1151 Encounter for screening for human papillomavirus (HPV): Secondary | ICD-10-CM | POA: Insufficient documentation

## 2012-03-17 DIAGNOSIS — Z01419 Encounter for gynecological examination (general) (routine) without abnormal findings: Secondary | ICD-10-CM | POA: Insufficient documentation

## 2012-06-11 DIAGNOSIS — D239 Other benign neoplasm of skin, unspecified: Secondary | ICD-10-CM

## 2012-06-11 HISTORY — DX: Other benign neoplasm of skin, unspecified: D23.9

## 2012-06-24 ENCOUNTER — Other Ambulatory Visit (HOSPITAL_COMMUNITY): Payer: Self-pay | Admitting: Neurology

## 2012-06-24 DIAGNOSIS — R519 Headache, unspecified: Secondary | ICD-10-CM

## 2012-06-28 ENCOUNTER — Ambulatory Visit (HOSPITAL_COMMUNITY)
Admission: RE | Admit: 2012-06-28 | Discharge: 2012-06-28 | Disposition: A | Discharge status: Home / Self Care | Payer: Medicaid Other | Source: Ambulatory Visit | Attending: Neurology | Admitting: Neurology

## 2012-06-28 DIAGNOSIS — R51 Headache: Secondary | ICD-10-CM | POA: Insufficient documentation

## 2012-06-28 DIAGNOSIS — R519 Headache, unspecified: Secondary | ICD-10-CM

## 2012-07-14 ENCOUNTER — Ambulatory Visit (HOSPITAL_COMMUNITY)
Admission: RE | Admit: 2012-07-14 | Discharge: 2012-07-14 | Disposition: A | Discharge status: Home / Self Care | Payer: Medicaid Other | Source: Ambulatory Visit | Attending: Internal Medicine | Admitting: Internal Medicine

## 2012-07-14 DIAGNOSIS — M545 Low back pain, unspecified: Secondary | ICD-10-CM | POA: Insufficient documentation

## 2012-07-14 DIAGNOSIS — M549 Dorsalgia, unspecified: Secondary | ICD-10-CM | POA: Diagnosis present

## 2012-07-14 DIAGNOSIS — IMO0001 Reserved for inherently not codable concepts without codable children: Secondary | ICD-10-CM | POA: Insufficient documentation

## 2012-07-14 DIAGNOSIS — M25559 Pain in unspecified hip: Secondary | ICD-10-CM | POA: Insufficient documentation

## 2012-07-14 DIAGNOSIS — M6281 Muscle weakness (generalized): Secondary | ICD-10-CM | POA: Insufficient documentation

## 2012-07-16 DIAGNOSIS — M25559 Pain in unspecified hip: Secondary | ICD-10-CM | POA: Insufficient documentation

## 2012-07-16 DIAGNOSIS — R2689 Other abnormalities of gait and mobility: Secondary | ICD-10-CM | POA: Insufficient documentation

## 2012-07-16 DIAGNOSIS — Q068 Other specified congenital malformations of spinal cord: Secondary | ICD-10-CM | POA: Insufficient documentation

## 2012-07-16 DIAGNOSIS — Z9181 History of falling: Secondary | ICD-10-CM | POA: Insufficient documentation

## 2012-07-16 HISTORY — DX: Other abnormalities of gait and mobility: R26.89

## 2012-07-16 NOTE — Evaluation (Addendum)
Physical Therapy Evaluation/Medicaid Evaluation  Patient Details  Name: Melissa Pruitt MRN: [id number] Date of Birth: 1975-11-20  Today's Date: 07/16/2012 Time: 0850-0930 PT Time Calculation (min): 40 min Charges: 1 evaluation             Visit#: 1 of 8  Re-eval: 08/15/12 Assessment Diagnosis: Pelvic Pain Next MD Visit: Dr. Sherryll Burger Prior Therapy: For pelvic pain last year  Authorization: Medicaid    Authorization Time Period: submitted on 07/16/12  Authorization Visit#: 1 of 4   Past Medical History:  Past Medical History  Diagnosis Date  . 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sacro/coccygeal mass excision  . Cervical cancer     removed in July 2009   Past Surgical History:  Past Surgical History  Procedure Laterality Date  . Back surgery    . Bladder surgery    . Colostomy      after flex sig as 37 year old.   . Tubal ligation    . Removal of cervical cancer    . Colostomy reversal    . Exploratory laparotomy      with colostomy placement    Subjective Symptoms/Limitations Pertinent History: Pt is a 37 year old female referred to PT for pelvic pain.  She has a significant hx of neurogenic bladder due to tethered spinal cord causing increased back and pelvic pain.  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  Due to lack of f/u with her MD (due to ignorance and knowledge of her mother) she did not recieve a spinal cord relase.  She has lived most of her life with back pain and it being related to chronic UTI's.  She has had a cholostomy bag and repeated urethral streching.  She has seen a multitudes of doctors who would treat UTI and d/c her. In 2012 she had seen Dr. Sherryll Burger who performed and MRI and found a tethered spinal cord.  Also MRI of head and neck without significant findings.  Eventually patient was to have tethered spinal cord causing neurogenic bladder (increaseing UTI's and increased back pain from  compensations from emptying her bladder).  She is currently independent with self catherazation. C/o She has increased pain to her hips, thighs and low back and pelvis, pain with intercourse, numbness to RLE below her knee, decreased strength and hx of falls.  Recently she was treated for a 5th metatarsal fracture that she was unaware of due to lack of feeling in her foot.  Patient Stated Goal: "I want to be able to have some relief, like I did when I was treated before.  I would like to get as strong as I can."  Pain Assessment Currently in Pain?: Yes Pain Score:   7 Pain Location: Perineum Pain Orientation: Right;Left Pain Type: Chronic pain Pain Onset: More than a month ago Pain Frequency: Constant Pain Relieving Factors: Has tried a multitude of medications to relieve pain with significant side effects. Currently recieving nerve blocks of neck pain, flexeril and lidoderm patchtaken   Precautions/Restrictions  Precautions Precautions: Fall  Balance Screening Balance Screen Has the patient fallen in the past 6 months: Yes How many times?: "I fall a lot" Has the patient had a decrease in activity level because of a fear of falling? : No Is the patient reluctant to leave their home because of a fear of falling? : No  Prior Functioning  Home Living Lives With: Family Copywriter, advertising  old, 39 year old, 39 year old) Prior Function Driving: Yes Comments: She is a single mother with a 73 and 72 year old at her home.   Cognition/Observation Observation/Other Assessments Observations: Notable pelvic floor contraction.    Sensation/Coordination/Flexibility/Functional Tests  Deep Tendon Reflexes: Right Knee Jerk: 3+, Left Knee Jerk: 3+, Achilles: Absent  Assessment Palpation Palpation: significant pain and tenderness to Rt external perimuem region, bil pudendal region and adductors. Hypotonic to bil perimeum Due to time unable to palpate internal perinum.    Mobility/Balance   Ambulation/Gait Ambulation/Gait: Yes Assistive device: Straight cane Gait Pattern: Lateral hip instability    Physical Therapy Assessment and Plan PT Assessment and Plan Clinical Impression Statement: Melissa Pruitt is a 37 year old female referred to PT for pelvic pain from an extensive PMH.  After evaluation it was found that she has significant pain and tenderness with increased fascial restrictions, decreased tone, impaired strength to the pelvic floor muscles (PFM). Pt will benefit from skilled therapeutic intervention in order to improve on the following deficits: Decreased strength;Impaired flexibility Rehab Potential: Good PT Frequency: Min 2X/week PT Duration: 8 weeks PT Treatment/Interventions: Gait training;Therapeutic exercise;Therapeutic activities;Balance training;Neuromuscular re-education;Patient/family education;Manual techniques;Modalities PT Plan: Continue with internal examination next visit and release techniques for pelvic floor.     Goals Home Exercise Program Pt will Perform Home Exercise Program: Independently PT Short Term Goals Time to Complete Short Term Goals: 4 weeks PT Short Term Goal 1: Pt will report pelvic pain less than 5/10 for 50% of her day.  PT Short Term Goal 2: Pt will present with decreased fascial restrictions to external and internal pelvic floor region for improved QOL.   PT Short Term Goal 3: Pt will improve her static standing balance to demonstrate tandem stance x10 seconds on BLE for improved safety with walking.   PT Long Term Goals Time to Complete Long Term Goals: 8 weeks PT Long Term Goal 1: Pt will be independent with self care and neuro re-ed for her PFM.  PT Long Term Goal 2: Pt will present with minimal fascial restrictions to her PFM in order to report decreased pain with intercourse.  Long Term Goal 3: Pt will score a 45/56 on the berg balance test.   Problem List Patient Active Problem List  Diagnosis  . BACK PAIN  . H N  P-LUMBAR  . OTHER SPECIFIED CONGENITAL ANOMALY SPINAL CORD  . 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. Pain in joint, pelvic region and thigh  . Tethered spinal cord  . Balance disorder  . Personal history of fall    PT Plan of Care PT Patient Instructions: pelvic floor therapy.  POC, answered questions regarding diagnosis/  Consulted and Agree with Plan of Care: Patient  [name], PT 07/16/2012, 3:33 PM  Physician Documentation Your signature is required to indicate approval of the treatment plan as stated above.  Please sign and either send electronically or make a copy of this report for your files and return this physician signed original.   Please mark one 1.__approve of plan  2. ___approve of plan with the following conditions.   ______________________________  _____________________ Physician Signature                                                                                                             Date  [facility] ATTNClaris Gower [address] [place], [place] [address] Phone: [phone] Fax: [phone]     INITIAL EVALUATION  Physical Therapy     Patient Name: Melissa Pruitt Date Of Birth: 1975/10/27  Guardian Name: N/A Treatment ICD-9 Code: 65784  Address: [address] Date of Evaluation: 07/15/2012  [place], [place] [address] Requested Dates of Service: 07/20/2012 - 09/14/2012       Therapy History: Another provider has provided therapy and has discharged recipient  Reason For Referral: Recipient has an ongoing injury, disease or condition  Prior Level of Function: *Required Assistance with ADLs (OT/PT) or Audition, Communication, Voice and/or Swallowing Skills (ST/AUD)  Additional Medical History: Pertinent History: Pt is a 37 year old female referred to PT for pelvic pain. She has a significant hx of neurogenic bladder due  to tethered spinal cord causing increased back and pelvic pain. 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 Due to lack of f/u with her MD (due to ignorance and knowledge of her mother) she did not recieve a spinal cord relase. She has lived most of her life with back pain and it being related to chronic UTI's. She has had a cholostomy bag and repeated urethral streching. She has seen a multitudes of doctors who would treat UTI and d/c her. In 2012 she had seen Dr. Sherryll Burger who performed and MRI and found a tethered spinal cord. Also MRI of head and neck without significant findings. Eventually patient was to have tethered spinal cord causing neurogenic bladder (increaseing UTI's and increased back pain from compensations from emptying her bladder). She is currently independent with self catherazation. C/o She has increased pain to her hips, thighs and low back and pelvis, pain with intercourse, numbness to RLE below her knee, decreased strength and hx of falls. Recently she was treated for a 5th metatarsal fracture that she was unaware of due to lack of feeling in her foot. Patient Stated Goal: "I want to be able to have some relief, like I did when I was treated before. I would like to get as strong as I can." Precautions/Restrictions Precautions Precautions: Fall Balance Screening Balance Screen Has the patient fallen in the past 6 months: Yes How many times?: "I fall a lot" Has the patient had a decrease in activity level because of a fear of falling? : No Is the patient reluctant to leave their home because of a fear of falling? : No Prior Functioning Home Living Lives With: Family (39year old, 39 year old, 39 year old) Prior Function Driving: Yes Comments: She is a single mother with a 49 and 89 year old at her home. Cognition/Observation Obser  Prematurity: N/A  Severity Level: N/A       Treatment Goals:  1. Goal: Pt will Perform Home Exercise Program:  Independently  Baseline: Discussed continuing with core exercises.  Duration: 4 Week(s)  2. Goal: Pt will report pelvic pain less than 5/10 for 50% of her day.  Baseline: Pain 7/10  Duration: 4 Week(s)  3. Goal: Pt will present with decreased fascial restrictions to external and internal pelvic floor region for improved QOL.  Baseline: hypotonic to perinum  Duration: 4 Week(s)  4. Goal: Pt will improve her static standing balance to demonstrate tandem stance x10 seconds on BLE for improved safety with walking.  Baseline: Assistive device: Straight cane Gait Pattern: Lateral hip instability  Duration: 4 Week(s)  5. Goal: Pt will be independent with self care and neuro re-ed for her PFM.  Baseline: notable lift with hyptonic to pernium.  Duration: 8 Week(s)  6. Goal: Pt will present with minimal fascial restrictions to her PFM in order to report decreased pain with intercourse.  Baseline: Extreme pain and tenderss with intercourse. significant pain and tenderness to Rt external perimuem region, bil pudendal region and adductors. Hypotonic to bil perimeum Due to time unable to palpate internal perinum.  Duration: 8 Week(s)  Goal: Pt will score a 45/56 on the berg balance test.  Baseline: Not tested due to time.  Duration: 8 Week(s)         Treatment Frequency/Duration:  2x/week for 8 weeks  Units per visit: N/A    Additional Information: Clinical Impression Statement: Melissa Pruitt is a 37 year old female referred to PT for pelvic pain from an extensive PMH. After evaluation it was found that she has significant pain and tenderness with increased fascial restrictions, decreased tone, impaired strength to the pelvic floor muscles (PFM). Due to time constraints with extensive history, LE strength and balance was not taken today. Will assess next visit. Pt will benefit from skilled therapeutic intervention in order to improve on the following deficits: Decreased strength;Impaired flexibility Rehab  Potential: Good PT Frequency: Min 2X/week PT Duration: 8 weeks PT Treatment/Interventions: Gait training;Therapeutic exercise;Therapeutic activities;Balance training;Neuromuscular re-education;Patient/family education;Manual techniques;Modalities           Therapist Signature  Date Physician Signature  Date    [name]       Therapist Name  Physician Name   Refer to the Review Status page for current case status

## 2012-07-20 ENCOUNTER — Ambulatory Visit (HOSPITAL_COMMUNITY)
Admission: RE | Admit: 2012-07-20 | Discharge: 2012-07-20 | Disposition: A | Discharge status: Home / Self Care | Payer: Medicaid Other | Source: Ambulatory Visit | Attending: Internal Medicine | Admitting: Internal Medicine

## 2012-07-20 NOTE — Progress Notes (Signed)
Physical Therapy Treatment Patient Details  Name: KENNETH LAX MRN: [id number] Date of Birth: Apr 17, 1975  Today's Date: 07/20/2012 Time: 5784-6962 PT Time Calculation (min): 40 min Charges: 40' Manual Visit#: 2 of 8  Re-eval: 08/15/12    Authorization: Medicaid  Authorization Time Period: submitted for 16 visits.   Authorization Visit#: 2 of     Subjective: Symptoms/Limitations Pertinent History: Pt reports that she is a little sore and tired today.  She was doing a lot of house work over the weekend and knew she would be sore.   Precautions/Restrictions     Exercise/Treatments  Manual Therapy Manual Therapy: Myofascial release Myofascial Release: Rt side pevic floor Level 1: 6-9 O'clock, Level 2 6-8 O'clock (illiococcygeous).  Used Strain counter strain (SCS) w/MFR after and Trigger point release (TPR) to decreasee fascial restrictions and improve mobility.  Rt hip flexor SCS.   Physical Therapy Assessment and Plan PT Assessment and Plan Clinical Impression Statement: Pt has significant reduction in Rt pelvic floor pain and able to make it to the 2nd level of pelvic floor and has significant reduction in pain and spasms at end of treatment.  Discussed with pt progressing towards balance activities and strengthening in the following weeks to continue to decrease risk of falls.  PT Plan: Continue with manual techqniues to hip flexors and internal pelvic floor.  Progress to strengthening and balance activities.     Goals    Problem List Patient Active Problem List  Diagnosis  . BACK PAIN  . H N P-LUMBAR  . OTHER SPECIFIED CONGENITAL ANOMALY SPINAL CORD  . 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. Pain in joint, pelvic region and thigh  . Tethered spinal cord  . Balance disorder  . Personal history of fall    PT Plan of Care Consulted and Agree with Plan of Care: Patient  [name], PT 07/20/2012, 12:28 PM

## 2012-07-21 ENCOUNTER — Ambulatory Visit: Payer: Self-pay | Admitting: Pain Medicine

## 2012-07-22 ENCOUNTER — Ambulatory Visit (HOSPITAL_COMMUNITY)
Admission: RE | Admit: 2012-07-22 | Discharge: 2012-07-22 | Disposition: A | Discharge status: Home / Self Care | Payer: Medicaid Other | Source: Ambulatory Visit | Attending: Internal Medicine | Admitting: Internal Medicine

## 2012-07-22 NOTE — Progress Notes (Addendum)
Physical Therapy Treatment Patient Details  Name: Melissa Pruitt MRN: [id number] Date of Birth: [birth year]  Today's Date: 07/22/2012 Time: 4098-1191 PT Time Calculation (min): 38 min Charges: 38' manual  Visit#: 3 of 8  Re-eval: 08/15/12    Authorization: Medicaid  Authorization Time Period:    Authorization Visit#: 3 of 8   Subjective: Symptoms/Limitations Symptoms: I had to drive to [place] yesterday and my back and pelvis are killing me today.  The therapy helped a lot last visit and I had a lot of relief until that evening.  Pain Assessment Currently in Pain?: Yes Pain Score:   8 Pain Location: Back Pain Orientation: Right  Precautions/Restrictions     Exercise/Treatments Manual Therapy Manual Therapy: Myofascial release Myofascial Release: Supine Rt side pevic floor Level 1: SCS to 6-9 O'clock w.MFR after to improve muscle length and decrease pain with diaphragmatic breating.  Prone: R gluteal through proximal hamstring MFR and SCS to Rt piriformis and pudendal nerve glides. All to decrease fascial restrictions and improve mobility to decreae pain and improve function.    Physical Therapy Assessment and Plan PT Assessment and Plan Clinical Impression Statement: Pt has increased difficulty relaxing pelvic floor today even with diaphragmatic breathing, likely due to intense low back pain from yesterdays traveling.  Proceeded with manual techniques to gluteal and hamstring region and added pudendal nerve glides, pt reports increaed feeling to her foot and signfiicant reduction in pain.  PT Plan: Continue with manual techqniues to hip flexors and internal pelvic floor.  Progress to strengthening and balance activities.     Goals    Problem List Patient Active Problem List  Diagnosis  . BACK PAIN  . H N P-LUMBAR  . OTHER SPECIFIED CONGENITAL ANOMALY SPINAL CORD  . 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. Pain in  joint, pelvic region and thigh  . Tethered spinal cord  . Balance disorder  . Personal history of fall    PT Plan of Care PT Patient Instructions: Educated on home therapy using tennis balls for MFR and to use soccer ball for kids to rub on her back to decrease pain.  Discussed posture, answered questiond regarding pudendal neuraglia.   GP    Demarkis Gheen 07/22/2012, 10:22 AM

## 2012-07-27 ENCOUNTER — Ambulatory Visit (HOSPITAL_COMMUNITY)
Admission: RE | Admit: 2012-07-27 | Discharge: 2012-07-27 | Disposition: A | Discharge status: Home / Self Care | Payer: Medicaid Other | Source: Ambulatory Visit | Attending: Internal Medicine | Admitting: Internal Medicine

## 2012-07-27 NOTE — Progress Notes (Signed)
Physical Therapy Treatment Patient Details  Name: Melissa Pruitt MRN: [id number] Date of Birth: May 11, 1975  Today's Date: 07/27/2012 Time: 4098-1191 PT Time Calculation (min): 43 min Charges: 43' manual  Visit#: 4 of 8  Re-eval: 08/15/12    Authorization: Medicaid  Authorization Time Period:    Authorization Visit#: 4 of 8   Subjective: Symptoms/Limitations Symptoms: Pt reports that she had relief until Saturday.  She was able to have intercourse this weekend, however continues to have pain afterwards. She reports that her pain is about a 6/10 these days which is much more tolerable then before.  Pain Assessment Currently in Pain?: Yes Pain Score:   6 Pain Location: Perineum Pain Orientation: Right  Precautions/Restrictions     Exercise/Treatments  Manual Therapy Manual Therapy: Joint mobilization Joint Mobilization: internal and extenal coccyx AP direction to decreae pain and improve mobility. Myofascial Release: Lt sidelying Rt side pevic floor Level 2: SCS to 6-9 O'clock w.MFR after to improve muscle length and decrease pain with diaphragmatic breating. Prone: Rt gluteal, adductors, proximal hamstring  w/pudendal nerve glides following. All to decrease fascial restrictions and improve mobility to decreae pain and improve function  Physical Therapy Assessment and Plan PT Assessment and Plan Clinical Impression Statement: Continues to have significant pain reduction and fascial restrictions after manual techniques. 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  PT Plan: Continue with manual techqniues to hip flexors and internal pelvic floor.  Progress to strengthening and balance activities.     Goals    Problem List Patient Active Problem List  Diagnosis  . BACK PAIN  . H N P-LUMBAR  . OTHER SPECIFIED CONGENITAL ANOMALY SPINAL CORD  . 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. Pain in joint, pelvic region  and thigh  . Tethered spinal cord  . Balance disorder  . Personal history of fall    PT Plan of Care PT Patient Instructions: using tenis balls Consulted and Agree with Plan of Care: Patient  GP    Melissa Pruitt 07/27/2012, 11:06 AM

## 2012-07-29 ENCOUNTER — Ambulatory Visit: Payer: Self-pay | Admitting: Pain Medicine

## 2012-07-29 ENCOUNTER — Ambulatory Visit (HOSPITAL_COMMUNITY)
Admission: RE | Admit: 2012-07-29 | Discharge: 2012-07-29 | Disposition: A | Discharge status: Home / Self Care | Payer: Medicaid Other | Source: Ambulatory Visit | Attending: Internal Medicine | Admitting: Internal Medicine

## 2012-07-29 NOTE — Progress Notes (Signed)
Physical Therapy Treatment Patient Details  Name: Melissa Pruitt MRN: [id number] Date of Birth: [birth year]  Today's Date: 07/29/2012 Time: 4098-1191 PT Time Calculation (min): 50 min Charges: 45' Manual, 5' self care Visit#: 5 of 8  Re-eval: 08/15/12    Authorization: Medicaid  Authorization Time Period:    Authorization Visit#: 4 of 8   Subjective: Symptoms/Limitations Symptoms: Pt reports overall is doing better, but today her entire Rt side is on fire.  Pain Assessment Currently in Pain?: Yes Pain Score:   8 Pain Location: Perineum  Precautions/Restrictions     Exercise/Treatments Manual Therapy Myofascial Release: Supine: External: adductor roll, pudendal nerve glides w/MFR, proximal hamstring. Internal: Layer 2 6-9 O'coloc with sweeping, trigger point release to Layer 2, MFR: Rt Internal hip rotators  w/Strain Counter Strain to decrease fascial restrictions and decrease pain.   Physical Therapy Assessment and Plan PT Assessment and Plan Clinical Impression Statement: Pt continues to have signiifcant reduction in pain after manual techniques.  Educated her and daughter on foam roller activities to help decrease fascial pain.  PT Plan: Continue with manual techqniues to hip flexors and internal pelvic floor.  Progress to strengthening and balance activities.     Goals    Problem List Patient Active Problem List  Diagnosis  . BACK PAIN  . H N P-LUMBAR  . OTHER SPECIFIED CONGENITAL ANOMALY SPINAL CORD  . 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. Pain in joint, pelvic region and thigh  . Tethered spinal cord  . Balance disorder  . Personal history of fall    PT Plan of Care PT Patient Instructions: using tenis balls Consulted and Agree with Plan of Care: Patient  GP    Thien Berka 07/29/2012, 9:43 AM

## 2012-08-03 ENCOUNTER — Ambulatory Visit (HOSPITAL_COMMUNITY)
Admission: RE | Admit: 2012-08-03 | Discharge: 2012-08-03 | Disposition: A | Discharge status: Home / Self Care | Payer: Medicaid Other | Source: Ambulatory Visit | Attending: Internal Medicine | Admitting: Internal Medicine

## 2012-08-03 NOTE — Progress Notes (Signed)
Physical Therapy Treatment Patient Details  Name: Melissa Pruitt MRN: [id number] Date of Birth: [birth year]  Today's Date: 08/03/2012 Time: 4098-1191 PT Time Calculation (min): 35 min Chaqrges: 30' Manual Visit#: 6 of 8  Re-eval: 08/15/12    Authorization: Medicaid used, currently [facility]  Authorization Time Period:    Authorization Visit#:   of     Subjective: Symptoms/Limitations Symptoms: Pt states that she had her occipital injection and is having some pain, reports that due to the stress of the injection and increased driving she is having a lot of pelvic pain.  Feels that the manual pudendal glides are helping feeling to her foot.  Pain Assessment Currently in Pain?: Yes Pain Score:   7 Pain Location: Perineum Pain Orientation: Right Pain Type: Chronic pain  Precautions/Restrictions     Exercise/Treatments Manual Therapy Myofascial Release: Supine: External: adductor roll, pudendal nerve glides w/MFR, proximal hamstring. Internal: Layer 2 6-9 O'coloc with sweeping, trigger point release to Layer 2, MFR: Rt Internal hip rotators w/Strain Counter Strain to decrease fascial restrictions and decrease pain x30 minutes  Physical Therapy Assessment and Plan PT Assessment and Plan Clinical Impression Statement: Pt 10 minutes late today.  Able to complete 30 minutes of manual therapy with significant reduction in internal spasms and decreased 50% of fascial restrictions to adductor and pudendal region with increased feeling to foot at end of session today.  PT Plan: Start with manual therapy and ther-ex to improve cervical strength and decreased occipital pain.     Goals    Problem List Patient Active Problem List  Diagnosis  . BACK PAIN  . H N P-LUMBAR  . OTHER SPECIFIED CONGENITAL ANOMALY SPINAL CORD  . 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. Pain in joint, pelvic region and thigh  . Tethered spinal cord  .  Balance disorder  . Personal history of fall    PT Plan of Care PT Patient Instructions: discussed using tennis balls and her rock to help decrease fascial restrictions.  Discussed colon massage to help with elimiation and constipation.  Consulted and Agree with Plan of Care: Patient  Melissa Pruitt, PT 08/03/2012, 9:41 AM

## 2012-08-06 ENCOUNTER — Ambulatory Visit (HOSPITAL_COMMUNITY)
Admission: RE | Admit: 2012-08-06 | Discharge: 2012-08-06 | Disposition: A | Discharge status: Home / Self Care | Payer: Medicaid Other | Source: Ambulatory Visit | Attending: Internal Medicine | Admitting: Internal Medicine

## 2012-08-06 NOTE — Progress Notes (Signed)
Physical Therapy Treatment Patient Details  Name: Melissa Pruitt MRN: [id number] Date of Birth: 08/24/1975  Today's Date: 08/06/2012 Time: 0810-0910 PT Time Calculation (min): 60 min Visit#: 7 of 8  Re-eval: 08/15/12 Charges:  therex 12', manual 30', MHP 15'    Authorization: Medicaid used, currently [facility]  Authorization Time Period:    Authorization Visit#:   of     Subjective: Symptoms/Limitations Symptoms: Pt. states her neck is bothering her today Pain Assessment Multiple Pain Sites: Yes   Exercise/Treatments Machines for Strengthening UBE (Upper Arm Bike): 4' backward Seated Exercises Neck Retraction: 10 reps;5 secs W Back: 20 reps    Modalities Modalities: Moist Heat Manual Therapy Manual Therapy: Other (comment) Other Manual Therapy: Cervical MFR, gentle traction, massage into upper traps and subocciptal release in supine f/b MHP Moist Heat Therapy Number Minutes Moist Heat: 15 Minutes Moist Heat Location: Other (comment) (cervical in supine)  Physical Therapy Assessment and Plan PT Assessment and Plan Clinical Impression Statement: Pt. 8' late today.  Noted tightness in subocciptial region with extended time required for release.  Multiple spasms in Bilateral Upper traps decreased with STM; noted tightness in bilateral cervical musculature.  Overall pain reduction at end of session with improved cervical mobility. PT Plan: Continue per PT POC.  Pt. requires re-eval next visit.     Problem List Patient Active Problem List  Diagnosis  . BACK PAIN  . H N P-LUMBAR  . OTHER SPECIFIED CONGENITAL ANOMALY SPINAL CORD  . 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. Pain in joint, pelvic region and thigh  . Tethered spinal cord  . Balance disorder  . Personal history of fall    PT Plan of Care PT Patient Instructions: discussed using tennis balls and her rock to help decrease fascial restrictions.   Discussed colon massage to help with elimiation and constipation.  Consulted and Agree with Plan of Care: Patient   Lurena Nida, PTA/CLT 08/06/2012, 11:04 AM

## 2012-08-10 ENCOUNTER — Ambulatory Visit (HOSPITAL_COMMUNITY)
Admission: RE | Admit: 2012-08-10 | Discharge: 2012-08-10 | Disposition: A | Discharge status: Home / Self Care | Payer: Medicaid Other | Source: Ambulatory Visit | Attending: Internal Medicine | Admitting: Internal Medicine

## 2012-08-10 NOTE — Evaluation (Signed)
Physical Therapy Re-Evaluation  Patient Details  Name: Melissa Pruitt MRN: [id number] Date of Birth: 07-20-1975  Today's Date: 08/10/2012 Time: 0802-0855 PT Time Calculation (min): 53 min Charges: 15' TE, 38' Manual             Visit#: 8 of 16  Re-eval: 09/09/12    Authorization: Medicaid used, currently [facility]    Authorization Time Period:    Authorization Visit#:   of     Past Medical History:  Past Medical History  Diagnosis Date  . 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sacro/coccygeal mass excision  . Cervical cancer     removed in July 2009   Past Surgical History:  Past Surgical History  Procedure Laterality Date  . Back surgery    . Bladder surgery    . Colostomy      after flex sig as 37 year old.   . Tubal ligation    . Removal of cervical cancer    . Colostomy reversal    . Exploratory laparotomy      with colostomy placement    Subjective Symptoms/Limitations Symptoms: Pt reports that her neck was feeling greater until she had to go to her drive to [place].  Today she reports that she still has PF pain.   Pain Assessment Currently in Pain?: Yes Pain Score:   5 Pain Location: Perineum Pain Orientation: Right Pain Type: Chronic pain  Precautions/Restrictions     Balance Screening    Prior Functioning     Cognition/Observation    Sensation/Coordination/Flexibility/Functional Tests    Assessment    Exercise/Treatments Mobility/Balance      Stretches   Aerobic   Machines for Strengthening   Standing   Seated   Supine Ab Set: Limitations AB Set Limitations: TrA/PFC 10x10" Clam: 5 reps;5 seconds Sidelying   Prone    Quadruped    Manual Therapy Myofascial Release: external to Rt pudenal region with pudenal nerve glides.  skin rolling to rt adductors. Other Manual Therapy: MET to correct R anterior sacral torsion w/crouch walking after to set alignment Moist Heat Therapy Moist Heat Location: Other  (comment) (cervical in supine)  Physical Therapy Assessment and Plan PT Assessment and Plan Clinical Impression Statement: Melissa Pruitt has attended 8 OP PT visits with the following findings: she has had improvement with decreased fascial restrictions and pain to her pelvic floor and has become independent with home care program, however continues to have significant limitations with sacral and hip alginment dysfunction and weak core muculature.  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  Can only maintain car rides up to 30 minutes before she continues to have pain.  Pt will benefit from skilled therapeutic intervention in order to improve on the following deficits: Abnormal gait;Decreased strength;Pain;Decreased balance;Decreased coordination;Difficulty walking;Increased fascial restricitons Rehab Potential: Good PT Frequency: Min 2X/week PT Duration: 4 weeks PT Plan: Continue with MET to correct spinal and pelvic alignment, manual therapy to adductors and cervical region.  Continue with core strengthening progression.     Goals Home Exercise Program Pt will Perform Home Exercise Program: Independently PT Goal: Perform Home Exercise Program - Progress: Met PT Short Term Goals Time to Complete Short Term Goals: 4 weeks PT Short Term Goal 1: Pt will report pelvic pain less than 5/10 for 50% of her day.  PT Short Term Goal 1 -  Progress: Progressing toward goal PT Short Term Goal 2: Pt will present with decreased fascial restrictions to external and internal pelvic floor region for improved QOL.   PT Short Term Goal 2 - Progress: Progressing toward goal PT Short Term Goal 3: Pt will improve her static standing balance to demonstrate tandem stance x10 seconds on BLE for improved safety with walking.   PT Short Term Goal 3 - Progress: Not met PT Long  Term Goals Time to Complete Long Term Goals: 8 weeks PT Long Term Goal 1: Pt will be independent with self care and neuro re-ed for her PFM.  PT Long Term Goal 1 - Progress: Progressing toward goal PT Long Term Goal 2: Pt will present with minimal fascial restrictions to her PFM in order to report decreased pain with intercourse.  PT Long Term Goal 2 - Progress: Progressing toward goal Long Term Goal 3: Pt will score a 45/56 on the berg balance test.  Long Term Goal 3 Progress: Not met  Problem List Patient Active Problem List   Diagnosis Date Noted  . Pain in joint, pelvic region and thigh 07/16/2012  . 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 Balance disorder 07/16/2012  . Personal history of fall 07/16/2012  . 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 H N P-LUMBAR 04/23/2010  . BACK PAIN 01/28/2010    PT - End of Session Activity Tolerance: Patient tolerated treatment well PT Plan of Care PT Patient Instructions: discussed using tennis balls and her rock to help decrease fascial restrictions.  Discussed colon massage to help with elimiation and constipation.  Consulted and Agree with Plan of Care: Patient  GP    [name], MPT, ATC 08/10/2012, 4:31 PM  Physician Documentation Your signature is required to indicate approval of the treatment plan as stated above.  Please sign and either send electronically or make a copy of this report for your files and return this physician signed original.   Please mark one 1.__approve of plan  2. ___approve of plan with the following conditions.   ______________________________                                                          _____________________ Physician Signature                                                                                                             Date

## 2012-08-11 ENCOUNTER — Ambulatory Visit (HOSPITAL_COMMUNITY)
Admission: RE | Admit: 2012-08-11 | Discharge: 2012-08-11 | Disposition: A | Discharge status: Home / Self Care | Payer: Medicaid Other | Source: Ambulatory Visit | Attending: Internal Medicine | Admitting: Internal Medicine

## 2012-08-11 ENCOUNTER — Other Ambulatory Visit (HOSPITAL_COMMUNITY): Payer: Self-pay | Admitting: Anesthesiology

## 2012-08-11 DIAGNOSIS — M533 Sacrococcygeal disorders, not elsewhere classified: Secondary | ICD-10-CM

## 2012-08-11 NOTE — Progress Notes (Signed)
Physical Therapy Treatment Patient Details  Name: Melissa Pruitt MRN: [id number] Date of Birth: 1975-08-15  Today's Date: 08/11/2012 Time: 4403-4742 PT Time Calculation (min): 42 min Charges: 42' Manual Visit#: 9 of 16  Re-eval: 09/09/12    Authorization: Medicaid used, currently [facility]  Authorization Time Period:    Authorization Visit#:   of     Subjective: Symptoms/Limitations Symptoms: Pt went to a new pain clinic yesterday.  brought in script for LBP.  reports her back and pernieum is sore from sitting in their chairs from 11:30-4:15 yesterday.  How long can you stand comfortably?: 10 minutes before numbness in RLE Pain Assessment Currently in Pain?: Yes Pain Score:   5 Pain Location: Neck Pain Orientation: Left Pain Type: Acute pain;Chronic pain  Precautions/Restrictions     Exercise/Treatments  Manual Therapy Myofascial Release: SUPINE: suboccipital region and Lt cervical region.  MET to correct C3 Lt rotation.  external to Rt pudenal region with pudenal nerve glides. skin rolling to rt adductors Moist Heat Therapy Moist Heat Location:  (cervical in supine)  Physical Therapy Assessment and Plan PT Assessment and Plan Clinical Impression Statement: Manual therapy for Lt cervical occipital region and Rt pudenal region complete today.  Pt has notable rotation to C3 vertabrae with correct 25% w/manual and MET techniques and has signfiicant difficulty with de-recruitment of Lt sternocledomastoid muscle with Lt rotation and Rt sidebend  Has significant reduction in pain to cervical and pudendal region after.   Pt will benefit from skilled therapeutic intervention in order to improve on the following deficits: Abnormal gait;Decreased strength;Pain;Decreased balance;Decreased coordination;Difficulty walking;Increased fascial restricitons PT Plan: Continue with MET to correct spinal and pelvic alignment, manual therapy to adductors and cervical region.  Continue  with core strengthening progression.     Goals    Problem List Patient Active Problem List   Diagnosis Date Noted  . Pain in joint, pelvic region and thigh 07/16/2012  . 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 Balance disorder 07/16/2012  . Personal history of fall 07/16/2012  . 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 H N P-LUMBAR 04/23/2010  . BACK PAIN 01/28/2010    PT - End of Session Activity Tolerance: Patient tolerated treatment well  GP    Zubin Pontillo 08/11/2012, 10:28 AM

## 2012-08-13 ENCOUNTER — Ambulatory Visit (HOSPITAL_COMMUNITY): Payer: Medicaid Other

## 2012-08-17 ENCOUNTER — Ambulatory Visit (HOSPITAL_COMMUNITY)
Admission: RE | Admit: 2012-08-17 | Discharge: 2012-08-17 | Disposition: A | Discharge status: Home / Self Care | Payer: Medicaid Other | Source: Ambulatory Visit | Attending: Internal Medicine | Admitting: Internal Medicine

## 2012-08-17 ENCOUNTER — Ambulatory Visit (HOSPITAL_COMMUNITY)
Admission: RE | Admit: 2012-08-17 | Discharge: 2012-08-17 | Disposition: A | Discharge status: Home / Self Care | Payer: Medicaid Other | Source: Ambulatory Visit | Attending: Anesthesiology | Admitting: Anesthesiology

## 2012-08-17 DIAGNOSIS — IMO0001 Reserved for inherently not codable concepts without codable children: Secondary | ICD-10-CM | POA: Insufficient documentation

## 2012-08-17 DIAGNOSIS — M25559 Pain in unspecified hip: Secondary | ICD-10-CM | POA: Insufficient documentation

## 2012-08-17 DIAGNOSIS — M545 Low back pain, unspecified: Secondary | ICD-10-CM | POA: Insufficient documentation

## 2012-08-17 DIAGNOSIS — M6281 Muscle weakness (generalized): Secondary | ICD-10-CM | POA: Insufficient documentation

## 2012-08-17 NOTE — Progress Notes (Signed)
Physical Therapy Treatment Patient Details  Name: Melissa Pruitt MRN: [id number] Date of Birth: December 16, 1975  Today's Date: 08/17/2012 Time: 3086-5784 PT Time Calculation (min): 50 min Charges: 15' TE, 35' Manual  Visit#: 10 of 16  Re-eval: 09/09/12    Authorization: Medicaid used, currently [facility]  Authorization Time Period:    Authorization Visit#:   of     Subjective:    Precautions/Restrictions     Exercise/Treatments Mobility/Balance        Clinical cytogeneticist: 3 reps;30 seconds Aerobic   Machines for Strengthening   Standing   Seated Other Seated Lumbar Exercises: Quadratus lumborum stretch 3x30 sec holds Supine Ab Set: Limitations AB Set Limitations: TrA/PFC 10x10" Clam: 5 reps;5 seconds Bent Knee Raise: 5 reps;2 seconds (w/ab set) Other Supine Lumbar Exercises: Adductor stretch : 3 x 30 sec holds Sidelying   Prone    Quadruped    Manual Therapy Myofascial Release: MET to correct C3 Lt rotation. Supine: external to Rt pudenal region with pudenal nerve glides. skin rolling to rt adductors, Strain counter strain: Lt and Rt hip flexor region, Seated: right quadratus lumborum Moist Heat Therapy Moist Heat Location:  (cervical in supine)  Physical Therapy Assessment and Plan PT Assessment and Plan Clinical Impression Statement: Treatment focused on educating pt on proper posture and importance of posture to decrease risk of sacral and iliac rotation.  added core stabalization and stretching at end of session to improve sacral alignement.  Pt will benefit from skilled therapeutic intervention in order to improve on the following deficits: Abnormal gait;Decreased strength;Pain;Decreased balance;Decreased coordination;Difficulty walking;Increased fascial restricitons PT Plan: Continue with MET to correct spinal and pelvic alignment, manual therapy to adductors and cervical region.  Continue with core strengthening progression.      Goals    Problem List Patient Active Problem List   Diagnosis Date Noted  . Pain in joint, pelvic region and thigh 07/16/2012  . 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 Balance disorder 07/16/2012  . Personal history of fall 07/16/2012  . 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 H N P-LUMBAR 04/23/2010  . BACK PAIN 01/28/2010    PT - End of Session Activity Tolerance: Patient tolerated treatment well  GP    Greco Gastelum 08/17/2012, 12:07 PM

## 2012-08-18 ENCOUNTER — Ambulatory Visit (HOSPITAL_COMMUNITY)
Admission: RE | Admit: 2012-08-18 | Discharge: 2012-08-18 | Disposition: A | Discharge status: Home / Self Care | Payer: Medicaid Other | Source: Ambulatory Visit | Attending: Anesthesiology | Admitting: Anesthesiology

## 2012-08-18 DIAGNOSIS — M533 Sacrococcygeal disorders, not elsewhere classified: Secondary | ICD-10-CM

## 2012-08-18 DIAGNOSIS — Q057 Lumbar spina bifida without hydrocephalus: Secondary | ICD-10-CM | POA: Insufficient documentation

## 2012-08-18 DIAGNOSIS — M545 Low back pain, unspecified: Secondary | ICD-10-CM | POA: Insufficient documentation

## 2012-08-18 MED ORDER — GADOBENATE DIMEGLUMINE 529 MG/ML IV SOLN
10.0000 mL | Freq: Once | INTRAVENOUS | Status: AC | PRN
  Administered 2012-08-18: 10 mL via INTRAVENOUS

## 2012-08-19 ENCOUNTER — Ambulatory Visit (HOSPITAL_COMMUNITY)
Admission: RE | Admit: 2012-08-19 | Discharge: 2012-08-19 | Disposition: A | Discharge status: Home / Self Care | Payer: Medicaid Other | Source: Ambulatory Visit | Attending: Internal Medicine | Admitting: Internal Medicine

## 2012-08-19 NOTE — Progress Notes (Signed)
Physical Therapy Treatment Patient Details  Name: Melissa Pruitt MRN: [id number] Date of Birth: 05-Jan-1976  Today's Date: 08/19/2012 Time: 6295-2841 PT Time Calculation (min): 55 min Charges: manual x35', TE x 20 min Visit#: 11 of 16  Re-eval: 09/09/12    Authorization: Medicaid used, currently [facility]  Authorization Time Period:    Authorization Visit#:   of     Subjective: Symptoms/Limitations Symptoms: Reports that her back is feeling a lot better.  Has some pain in her Lt side still, but has resolved. Pain Assessment Currently in Pain?: Yes Pain Score:   5 Pain Location: Neck Pain Type: Acute pain;Chronic pain  Exercise/Treatments Stretches Active Hamstring Stretch: 3 reps;30 seconds Hip Flexor Stretch: 3 reps;30 seconds Aerobic Tread Mill: 15 minutes x1.5 mph   Manual Therapy Myofascial Release: Alignment checked without significant findings.  manual therapy to decrease fascial restrictions using hawk grips to adductors and pudenal nerve.   Moist Heat Therapy Moist Heat Location:  (cervical in supine)  Physical Therapy Assessment and Plan PT Assessment and Plan Pt will benefit from skilled therapeutic intervention in order to improve on the following deficits: Abnormal gait;Decreased strength;Pain;Decreased balance;Decreased coordination;Difficulty walking;Increased fascial restricitons PT Plan: Continue with MET to correct spinal and pelvic alignment, manual therapy to adductors and cervical region.  Continue with core strengthening progression.     Goals    Problem List Patient Active Problem List   Diagnosis Date Noted  . Pain in joint, pelvic region and thigh 07/16/2012  . 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 Balance disorder 07/16/2012  . Personal history of fall 07/16/2012  . 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 H N P-LUMBAR 04/23/2010  . BACK PAIN 01/28/2010    PT - End of Session Activity Tolerance: Patient tolerated treatment well  GP    Maylon Sailors, MPT, ATC 08/19/2012, 1:09 PM

## 2012-08-23 ENCOUNTER — Ambulatory Visit: Payer: Self-pay | Admitting: Pain Medicine

## 2012-08-24 ENCOUNTER — Ambulatory Visit (HOSPITAL_COMMUNITY)
Admission: RE | Admit: 2012-08-24 | Discharge: 2012-08-24 | Disposition: A | Discharge status: Home / Self Care | Payer: Medicaid Other | Source: Ambulatory Visit | Attending: Internal Medicine | Admitting: Internal Medicine

## 2012-08-24 NOTE — Progress Notes (Signed)
Physical Therapy Treatment Patient Details  Name: Melissa Pruitt MRN: [id number] Date of Birth: Apr 30, 1975  Today's Date: 08/24/2012 Time: 4098-1191 PT Time Calculation (min): 45 min Charge; Manual 22', Therex 12'  Visit#: 12 of 16  Re-eval: 09/09/12    Authorization: Medicaid used, currently [facility]  Authorization Time Period:    Authorization Visit#:   of     Subjective: Symptoms/Limitations Symptoms: Pt brought in order for greater occipital neuralgia for PT.  Pt stated pain scale 6-7/10 LBP. Pain Assessment Currently in Pain?: Yes Pain Score:   7 Pain Location: Back Pain Orientation: Lower  Objective:  Exercise/Treatments Aerobic Tread Mill: 1.5 mph with incline 5 forward and backwards 5' total following MET   Supine Ab Set: Limitations AB Set Limitations: TrA/PFC 10x10"  Manual Therapy Manual Therapy: Massage Myofascial Release: Manual MFR to Rt quadratus lumborum/lumbar region to decrease fascial restrictions using hawk grips x 12 minutes Other Manual Therapy: MET for Rt anterior rotation f/b TM incline 5 x 5 minutes  Physical Therapy Assessment and Plan PT Assessment and Plan Clinical Impression Statement: Session focus on manual techniques for SI alignment (MET), therex for core strengthening to assure alignment and manual techniques to reduced fascial restrictions for improved hip mobility and pain reduced. PT Plan: Continue with MET to correct spinal and pelvic alignment, manual therapy to adductors and cervical region.  Continue with core strengthening progression.     Goals    Problem List Patient Active Problem List   Diagnosis Date Noted  . Pain in joint, pelvic region and thigh 07/16/2012  . 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 Balance disorder 07/16/2012  . Personal history of fall 07/16/2012  . 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 UNSPEC CONGN ANOMALY BRAIN  SP CORD&NERV SYSTEM 05/08/2010  . H N P-LUMBAR 04/23/2010  . BACK PAIN 01/28/2010    PT - End of Session Activity Tolerance: Patient tolerated treatment well General Behavior During Therapy: WFL for tasks assessed/performed Cognition: WFL for tasks performed  GP    Juel Burrow 08/24/2012, 12:07 PM

## 2012-08-25 ENCOUNTER — Ambulatory Visit (HOSPITAL_COMMUNITY)
Admission: RE | Admit: 2012-08-25 | Discharge: 2012-08-25 | Disposition: A | Discharge status: Home / Self Care | Payer: Medicaid Other | Source: Ambulatory Visit | Attending: Internal Medicine | Admitting: Internal Medicine

## 2012-08-25 DIAGNOSIS — M542 Cervicalgia: Secondary | ICD-10-CM | POA: Insufficient documentation

## 2012-08-25 NOTE — Evaluation (Signed)
Physical Therapy Evaluation  Patient Details  Name: Melissa Pruitt MRN: [id number] Date of Birth: June 16, 1975  Today's Date: 08/25/2012 Time: 4098-1191 PT Time Calculation (min): 56 min Charges: 1 eval, 10' manual, 20' TE             Visit#: 1 of 8  Re-eval: 09/24/12 Assessment Diagnosis: Cervical pain Next MD Visit: Dr. Threasa Beards - June 3rd (facet injections) Prior Therapy: Currently recieving for pelvic floor pain.   Authorization: Medicaid used, currently [facility]    Authorization Time Period:    Authorization Visit#:   of     Past Medical History:  Past Medical History  Diagnosis Date  . 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sacro/coccygeal mass excision  . Cervical cancer     removed in July 2009   Past Surgical History:  Past Surgical History  Procedure Laterality Date  . Back surgery    . Bladder surgery    . Colostomy      after flex sig as 37 year old.   . Tubal ligation    . Removal of cervical cancer    . Colostomy reversal    . Exploratory laparotomy      with colostomy placement    Subjective Symptoms/Limitations Symptoms: Pt is a current patient of this practice recieving physical therapy for pelvic and low back pain.  She has come in with a referral for neck pain.  She is currently recieving injections for occipital nerve blocks for her occipital region.  Feels that it took some of the pain away, but it is not all away.  She reports that somedays are better than others.   Pain Assessment Currently in Pain?: Yes Pain Score:   7 Pain Location: Neck Pain Orientation: Left Pain Type: Acute pain;Chronic pain Pain Onset: More than a month ago Pain Frequency: Constant  Assessment Cervical AROM Cervical Flexion: WNL  Cervical Extension: WNL - pain Cervical - Right Side Bend: 11.5 - increased pain Cervical - Left Side Bend: 11.5 - no pain Cervical - Right Rotation: 12 Cervical - Left Rotation: 16 Palpation Palpation: significant fascial  restrictions throughout Left upper trapezius and scalenes.  Decreased C3-C6 spinous process mobility and C3, C6 transverse process mobility  Exercise/Treatments Theraband Exercises Scapula Retraction: 10 reps;Red Shoulder Extension: 10 reps;Red Shoulder ADduction: 10 reps;Red Aerobic Tread Mill: 1.2-1.5 mph x15 minutes   Manual Therapy Joint Mobilization: grade II-III AP to C3-C6 spinous process with diaphragmatic breathing.  Myofascial Release: Supine: to Lt Upper Trapezius   Physical Therapy Assessment and Plan PT Assessment and Plan Clinical Impression Statement: Melissa Pruitt is a 37 year old female currently being treated for pelvic and low back pain from Dr. Sherryll Burger and is now sent an order to treat cervical pain and dysfunction from the [facility] with the following impairments below.   Pt will benefit from skilled therapeutic intervention in order to improve on the following deficits: Pain;Increased muscle spasms;Decreased range of motion;Decreased strength;Increased fascial restricitons;Decreased mobility PT Frequency: Min 1X/week PT Duration: 8 weeks PT Plan: Continue 1x/week to address cervical pain and continue 2x/week for pelvic and low back pain. Continue with MET to correct spinal and pelvic alignment, manual therapy to adductors and cervical region.  Continue with core strengthening progression.     Goals Home Exercise Program Pt will Perform Home Exercise Program: Independently PT Goal: Perform Home Exercise Program - Progress: Goal set today PT Short Term Goals Time to Complete Short Term Goals: 4 weeks PT  Short Term Goal 1: Pt will report neck pain less than 3/10 for 75% of her day.  PT Short Term Goal 2: Pt will improve her cervical AROM to WNL. PT Short Term Goal 3: Pt will present with mild fascial restrictions to cervical region for improved QOL.  PT Short Term Goal 4: Pt will improve shoulder and cervical stabalizer strength in order to maintain approprriate  cervical posture to decrease risk of further injury.   Problem List Patient Active Problem List   Diagnosis Date Noted  . Cervical pain 08/25/2012  . Pain in joint, pelvic region and thigh 07/16/2012  . 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 Balance disorder 07/16/2012  . Personal history of fall 07/16/2012  . 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 H N P-LUMBAR 04/23/2010  . BACK PAIN 01/28/2010    PT - End of Session Activity Tolerance: Patient tolerated treatment well General Behavior During Therapy: WFL for tasks assessed/performed Cognition: WFL for tasks performed PT Plan of Care PT Home Exercise Plan: provided with red theraband for home and cervical strengthening activities.  PT Patient Instructions: importance of posture and POC for neck and back.  Consulted and Agree with Plan of Care: Patient  GP    [name], MPT, ATC 08/25/2012, 3:00 PM  Physician Documentation Your signature is required to indicate approval of the treatment plan as stated above.  Please sign and either send electronically or make a copy of this report for your files and return this physician signed original.   Please mark one 1.__approve of plan  2. ___approve of plan with the following conditions.   ______________________________                                                          _____________________ Physician Signature                                                                                                             Date

## 2012-08-25 NOTE — Progress Notes (Signed)
Faxed Referral the [facility].

## 2012-08-26 ENCOUNTER — Ambulatory Visit (HOSPITAL_COMMUNITY): Payer: Medicaid Other

## 2012-08-31 ENCOUNTER — Ambulatory Visit (HOSPITAL_COMMUNITY)
Admission: RE | Admit: 2012-08-31 | Discharge: 2012-08-31 | Disposition: A | Discharge status: Home / Self Care | Payer: Medicaid Other | Source: Ambulatory Visit | Attending: Internal Medicine | Admitting: Internal Medicine

## 2012-08-31 DIAGNOSIS — M542 Cervicalgia: Secondary | ICD-10-CM

## 2012-08-31 NOTE — Progress Notes (Signed)
Physical Therapy Treatment Patient Details  Name: Melissa Pruitt MRN: [id number] Date of Birth: 16-Dec-1975  Today's Date: 08/31/2012 Time: 4098-1191 PT Time Calculation (min): 97 min Charge:: Manual 32', Therex 15'  Visit#: 2 of 8  Re-eval: 09/24/12 Assessment Diagnosis: Cervical pain Next MD Visit: Dr. Threasa Beards - June 3rd (facet injections) Prior Therapy: Currently recieving for pelvic floor pain.   Authorization: Medicaid used, currently [facility]  Authorization Time Period: submitted for 16 visits.   Authorization Visit#: 2 of 8   Subjective: Symptoms/Limitations Symptoms: Pt reported cervical pain Lt side 10/10 where they did the occipitialnerve blocking.   Pain Assessment Currently in Pain?: Yes Pain Score:   9 Pain Location: Neck Pain Orientation: Left  Precautions/Restrictions  Precautions Precautions: Fall  Exercise/Treatments Theraband Exercises Scapula Retraction: 10 reps;Red Shoulder Extension: 10 reps;Red Shoulder ADduction: 10 reps;Red  Manual Therapy Manual Therapy: Massage Massage: Lt cervical scales and SCM with trigger point, suboccipital release, and SCS techniques to reduce spasms  Physical Therapy Assessment and Plan PT Assessment and Plan Clinical Impression Statement: Multiple spasms pt.'s Lt SCM and scales, manual techniques including SCS, soft tissue massage, trigger point techniques and suboccipitial release complete to reduce spasms, improve cerivcal ROM and reduce pain.  Pt stated pain reduced by 4 levels following manual.  Min cueing required for correct form and technique with therex this session. PT Plan: Continue 1x/week to address cervical pain and continue 2x/week for pelvic and low back pain. Continue with MET to correct spinal and pelvic alignment, manual therapy to adductors and cervical region.  Continue with core strengthening progression.     Goals    Problem List Patient Active Problem List   Diagnosis Date Noted  .  Cervical pain 08/25/2012  . Pain in joint, pelvic region and thigh 07/16/2012  . 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 Balance disorder 07/16/2012  . Personal history of fall 07/16/2012  . 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 H N P-LUMBAR 04/23/2010  . BACK PAIN 01/28/2010    PT - End of Session Activity Tolerance: Patient tolerated treatment well General Behavior During Therapy: WFL for tasks assessed/performed Cognition: WFL for tasks performed  GP    Juel Burrow 08/31/2012, 6:37 PM

## 2012-08-31 NOTE — Progress Notes (Signed)
Physical Therapy Treatment Patient Details  Name: Melissa Pruitt MRN: [id number] Date of Birth: 03-12-1976  Today's Date: 08/31/2012 Time: 4098-1191 PT Time Calculation (min): 97 min Charge; Manual 38', Therex 12'  Visit#: 13 of 16  Re-eval: 09/09/12    Authorization: Medicaid used, currently [facility]  Authorization Time Period: submitted for 16 visits.   Authorization Visit#: 13 of 16   Subjective: Symptoms/Limitations Symptoms: LBP pain scale 7-8/10 today. Pain Assessment Currently in Pain?: Yes Pain Score:   8 Pain Location: Back Pain Orientation: Left;Lower  Precautions/Restrictions   Fall  Chartered certified accountant: 3 reps;30 seconds Hip Flexor Stretch: 3 reps;30 seconds;Limitations Hip Flexor Stretch Limitations: prone with manual stretch Quad Stretch: 3 reps;30 seconds Aerobic Tread Mill: 1.2-1.5 following MET x 15 minutes with 12'30" forwards and 2'30" backwards with knee flexion for set SI alignment Supine Ab Set: Limitations AB Set Limitations: TrA/PFC 10x10"  Manual Therapy Massage: Prone position STM to lumbar and sacral region and scar tissue MFR Other Manual Therapy: MET for Rt anterior rotation f/b TM inclince 5 forwards x 12'30", backwards 2'30  Physical Therapy Assessment and Plan PT Assessment and Plan Clinical Impression Statement: MET for Rt SI anterior rotation followed by TM to set alignement and session focus on core activation and stretches.  Massage and MFR to lumbar and sacral region to reduce spasms, very tight spasms noted Rt lumbar region, able to reduce but unable to fully resolve.  Pt reported pain reduced at end of session with improved gait mechanics noted,. PT Plan: Continue 1x/week to address cervical pain and continue 2x/week for pelvic and low back pain. Continue with MET to correct spinal and pelvic alignment, manual therapy to adductors and cervical region.  Continue with core strengthening  progression.     Goals    Problem List Patient Active Problem List   Diagnosis Date Noted  . Cervical pain 08/25/2012  . Pain in joint, pelvic region and thigh 07/16/2012  . 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 Balance disorder 07/16/2012  . Personal history of fall 07/16/2012  . 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 H N P-LUMBAR 04/23/2010  . BACK PAIN 01/28/2010    PT - End of Session Activity Tolerance: Patient tolerated treatment well General Behavior During Therapy: WFL for tasks assessed/performed Cognition: WFL for tasks performed  GP    Juel Burrow 08/31/2012, 6:50 PM

## 2012-09-01 ENCOUNTER — Encounter: Payer: Self-pay | Admitting: Obstetrics & Gynecology

## 2012-09-01 ENCOUNTER — Ambulatory Visit (INDEPENDENT_AMBULATORY_CARE_PROVIDER_SITE_OTHER): Payer: Medicaid Other | Admitting: Obstetrics & Gynecology

## 2012-09-01 ENCOUNTER — Telehealth: Payer: Self-pay | Admitting: Obstetrics and Gynecology

## 2012-09-01 VITALS — BP 100/60 | Ht 64.0 in | Wt 118.0 lb

## 2012-09-01 DIAGNOSIS — Z1389 Encounter for screening for other disorder: Secondary | ICD-10-CM

## 2012-09-01 DIAGNOSIS — R109 Unspecified abdominal pain: Secondary | ICD-10-CM

## 2012-09-01 DIAGNOSIS — Z7251 High risk heterosexual behavior: Secondary | ICD-10-CM

## 2012-09-01 LAB — POCT URINALYSIS DIPSTICK
Ketones, UA: NEGATIVE
Leukocytes, UA: NEGATIVE
Nitrite, UA: NEGATIVE
Protein, UA: NEGATIVE

## 2012-09-01 MED ORDER — CIPROFLOXACIN HCL 500 MG PO TABS: 500.0000 mg | ORAL_TABLET | Freq: Two times a day (BID) | ORAL | Status: DC

## 2012-09-01 NOTE — Telephone Encounter (Signed)
Pt requesting results for STD test done in December 2013, results were all negative. Pt wants to be retested for ALL STD's including GC/CHL, Trich. Call transferred to front staff for an appt to be made.

## 2012-09-01 NOTE — Progress Notes (Signed)
Patient ID: [name], female   DOB: 1975-06-30, 37 y.o.   MRN: [id number] Face to face 30 minutes  Sexual Transmitted diseases discussed extensively. UTI  GC Chlamydia Trichomonas HR HPV Hep b Hep C RPR HIV HSVII  EURE,LUTHER H 09/01/2012 3:31 PM

## 2012-09-02 ENCOUNTER — Ambulatory Visit (HOSPITAL_COMMUNITY): Payer: Medicaid Other

## 2012-09-02 LAB — HSV 2 ANTIBODY, IGG: HSV 2 Glycoprotein G Ab, IgG: 5.31 IV — ABNORMAL HIGH

## 2012-09-02 LAB — RPR

## 2012-09-02 LAB — HEPATITIS B SURFACE ANTIGEN: Hepatitis B Surface Ag: NEGATIVE

## 2012-09-03 ENCOUNTER — Ambulatory Visit (HOSPITAL_COMMUNITY)
Admission: RE | Admit: 2012-09-03 | Discharge: 2012-09-03 | Disposition: A | Discharge status: Home / Self Care | Payer: Medicaid Other | Source: Ambulatory Visit | Attending: Internal Medicine | Admitting: Internal Medicine

## 2012-09-03 NOTE — Progress Notes (Signed)
Physical Therapy Treatment Patient Details  Name: Melissa Pruitt MRN: [id number] Date of Birth: [birth year]  Today's Date: 09/03/2012 Time: 5784-6962 PT Time Calculation (min): 48 min Charge: Manual 38', Therex 10'  Visit#: 14 of 16  Re-eval: 09/09/12    Authorization: Medicaid used, currently [facility]  Authorization Time Period: submitted for 16 visits.   Authorization Visit#: 14 of 16   Subjective: Symptoms/Limitations Symptoms: Neck is sore following last session does not feel as bad as prior.  Rt LBP 8/10 dull tight achey pain, Rt buttocks 10/10 sharp pain Pain Assessment Currently in Pain?: Yes Pain Score:   8 Pain Location: Back Pain Orientation: Right;Lower  Objective:   Exercise/Treatments Stretches Active Hamstring Stretch: 3 reps;30 seconds;Limitations Active Hamstring Stretch Limitations: manual Hip Flexor Stretch: 2 reps;30 seconds Aerobic Tread Mill: 7\' 30"  following MET @ 1.0-->1.2 mph 5' forward, 2x30" backwards Standing Other Standing Knee Exercises: Standing adductor stretch 5x 10"  Manual Therapy Manual Therapy: Other (comment) Myofascial Release: manual therapy to decrease fascial restrictions using hawk grips to adductors and pudenal nerve Other Manual Therapy: MET for Lt outflare and Rt inflare f/b TM incline 5 @ 1.0-->1.2 x 7\' 30"  5' forwards, 2'30" backwards  Physical Therapy Assessment and Plan PT Assessment and Plan Clinical Impression Statement: MET for Lt SI outflare and Rt inflare with pubic clearing and gait training following.  Pt reported pain reduced to 5/10 with improved hip mobility with improved hip IR and ER and gait mechanics.  MFR and STM complete to decrease fascial restrictions and scar tissue using hawk grips to adductors and pudenal nerve.  Pt reported increased sensations to plantar surface of Rt foot during manual.  Pt encouraged to continue with core activation to promote SI alingment and to drink extra water to reduce  risk of headaches. PT Plan: Continue 1x/week to address cervical pain and continue 2x/week for pelvic and low back pain. Continue with MET to correct spinal and pelvic alignment, manual therapy to adductors and cervical region.  Continue with core strengthening progression.     Goals    Problem List Patient Active Problem List   Diagnosis Date Noted  . Cervical pain 08/25/2012  . Pain in joint, pelvic region and thigh 07/16/2012  . 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 Balance disorder 07/16/2012  . Personal history of fall 07/16/2012  . 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 H N P-LUMBAR 04/23/2010  . BACK PAIN 01/28/2010    PT - End of Session Activity Tolerance: Patient tolerated treatment well General Behavior During Therapy: WFL for tasks assessed/performed Cognition: WFL for tasks performed  GP    Juel Burrow 09/03/2012, 12:25 PM

## 2012-09-07 ENCOUNTER — Ambulatory Visit (HOSPITAL_COMMUNITY): Payer: Medicaid Other | Admitting: Physical Therapy

## 2012-09-07 ENCOUNTER — Telehealth: Payer: Self-pay | Admitting: Obstetrics & Gynecology

## 2012-09-07 ENCOUNTER — Ambulatory Visit (HOSPITAL_COMMUNITY)
Admission: RE | Admit: 2012-09-07 | Discharge: 2012-09-07 | Disposition: A | Discharge status: Home / Self Care | Payer: Medicaid Other | Source: Ambulatory Visit | Attending: Internal Medicine | Admitting: Internal Medicine

## 2012-09-07 DIAGNOSIS — M542 Cervicalgia: Secondary | ICD-10-CM

## 2012-09-07 NOTE — Progress Notes (Signed)
Physical Therapy Treatment Patient Details  Name: Melissa Pruitt MRN: [id number] Date of Birth: 1975-11-06  Today's Date: 09/07/2012 Time: 7829-5621 PT Time Calculation (min): 49 min Charges: 49' Manual  Visit#: 15 of 16  Re-eval: 09/09/12    Authorization: Medicaid used, currently [facility]  Authorization Time Period: submitted for 16 visits.   Authorization Visit#: 15 of 16   Subjective: Symptoms/Limitations Symptoms: Pt reports that her neck is really sore today, Reports she only has time for one session today.  Pain Assessment Currently in Pain?: Yes Pain Score:   6 (after session, 10/10 before) Pain Location: Neck  Precautions/Restrictions     Exercise/Treatments Manual Therapy Joint Mobilization: grade II-III AP to C3 spinous process with diaphragmatic breathing Myofascial Release: SUPINE w/legs elevated to L cervical and scalene region to decrease fascial restrictions and improve mobility.  Other Manual Therapy: manual cervical traction, attemptemted MET to correct Left C3 rotation  Physical Therapy Assessment and Plan PT Assessment and Plan Clinical Impression Statement: Pt continues to have greatest difficulty with C3 rotation and did not improve with MET technique, had greatest results with MFR and long sustained medial hold to C3 to improve alignment.  Pt reports greater ease opening and closing jaw and decreased pain at end of seesion.  Pt independent with cervical stretching and strengthening and encouraged to continue at her own at home.  PT Plan: Continue 1x/week to address cervical pain and continue 2x/week for pelvic and low back pain. Continue with MET to correct spinal and pelvic alignment, manual therapy to adductors and cervical region.  Continue with core strengthening progression.     Goals    Problem List Patient Active Problem List   Diagnosis Date Noted  . Cervical pain 08/25/2012  . Pain in joint, pelvic region and thigh 07/16/2012  .  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 Balance disorder 07/16/2012  . Personal history of fall 07/16/2012  . 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 H N P-LUMBAR 04/23/2010  . BACK PAIN 01/28/2010    PT - End of Session Activity Tolerance: Patient tolerated treatment well General Behavior During Therapy: WFL for tasks assessed/performed Cognition: WFL for tasks performed  GP    Dino Borntreger, MPT, ATC 09/07/2012, 9:45 AM

## 2012-09-07 NOTE — Telephone Encounter (Signed)
Pt aware of results that we have. And aware no culture was done on urine.  Pt aware that other results are not back yet. Pt also advised to take medication that Dr. Despina Hidden gave her.

## 2012-09-09 ENCOUNTER — Ambulatory Visit (HOSPITAL_COMMUNITY): Payer: Medicaid Other | Admitting: Physical Therapy

## 2012-09-09 ENCOUNTER — Telehealth: Payer: Self-pay | Admitting: Obstetrics and Gynecology

## 2012-09-09 ENCOUNTER — Other Ambulatory Visit: Payer: Medicaid Other

## 2012-09-09 ENCOUNTER — Ambulatory Visit (HOSPITAL_COMMUNITY)
Admission: RE | Admit: 2012-09-09 | Discharge: 2012-09-09 | Disposition: A | Discharge status: Home / Self Care | Payer: Medicaid Other | Source: Ambulatory Visit | Attending: Internal Medicine | Admitting: Internal Medicine

## 2012-09-09 DIAGNOSIS — M542 Cervicalgia: Secondary | ICD-10-CM

## 2012-09-09 DIAGNOSIS — Z1389 Encounter for screening for other disorder: Secondary | ICD-10-CM

## 2012-09-09 NOTE — Telephone Encounter (Signed)
Results not back yet for GC/CHL. Pt notified. JSY

## 2012-09-09 NOTE — Progress Notes (Signed)
Physical Therapy Treatment Patient Details  Name: Melissa Pruitt MRN: [id number] Date of Birth: [birth year]  Today's Date: 09/09/2012 Time: 1478-2956 PT Time Calculation (min): 43 min Charges: 4' TE, 39' Manual  Visit#: 16 of 16  Re-eval: 10/09/12    Authorization: Medicaid used, currently [facility]  Authorization Time Period: submitted for 16 visits.   Authorization Visit#: 16 of 16   Subjective: Symptoms/Limitations Symptoms: Pt reports that her pelvic floor has been doing ok and she has been able to tolerate intercourse, however continues to have cramping to her hip.  Reports she has improved feeling to her foot.  Her neck is feeling great after last session, but mentions soreness to her scapular muscles from the t-band.  Pain Assessment Currently in Pain?: Yes Pain Score:   9 Pain Location: Perineum  Precautions/Restrictions     Exercise/Treatments Stretches  Hip flexor 2x1 minute hold BLE   Manual Therapy Myofascial Release: Supine and Prone to Bil adductors, Bil hamstrings, Rt pudendal and external obturator externus to decrease fascial restrictions and improve mobility.   Physical Therapy Assessment and Plan PT Assessment and Plan Clinical Impression Statement: Pt continues to have significant fascial restrictions to adductors, hamstrings and Rt pudendal region, however continues to have greatest pain to her obturator internus (palpated extrenally today) which was releived with SCS techniques.  Added prone hip flexor stretching at end of session to improve mobility and decrease pain.  PT Plan: Continue to decrease fascial restrictions to pudendal region, hamstrings and gluteal region.  Continue with flexibility activities and core strengthening as needed for proper pelvic alignment.     Goals    Problem List Patient Active Problem List   Diagnosis Date Noted  . Cervical pain 08/25/2012  . Pain in joint, pelvic region and thigh 07/16/2012  . Tethered  spinal cord 07/16/2012  . Balance disorder 07/16/2012  . Personal history of fall 07/16/2012  . 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 H N P-LUMBAR 04/23/2010  . BACK PAIN 01/28/2010    PT - End of Session Activity Tolerance: Patient tolerated treatment well General Behavior During Therapy: WFL for tasks assessed/performed Cognition: WFL for tasks performed  GP    Hunter Bachar 09/09/2012, 10:16 AM

## 2012-09-09 NOTE — Progress Notes (Signed)
Physical Therapy Treatment Patient Details  Name: Melissa Pruitt MRN: [id number] Date of Birth: 07-30-1975  Today's Date: 09/09/2012 Time: 4098-1191 PT Time Calculation (min): 53 min Charges: 35' Manual, 1 traction  Visit#: 17 of 27  Re-eval: 10/09/12    Authorization: [facility]  Authorization Time Period: submitted for 16 visits.   Authorization Visit#: 16 of 16   Subjective: Symptoms/Limitations Symptoms: Pt reports that her pelvic floor has been doing ok and she has been able to tolerate intercourse, however continues to have cramping to her hip.  Reports she has improved feeling to her foot.  Her neck is feeling great after last session, but mentions soreness to her scapular muscles from the t-band.  Pain Assessment Currently in Pain?: Yes Pain Score:   9 Pain Location: Perineum  Precautions/Restrictions     Exercise/Treatments Modalities Modalities: Traction Manual Therapy Joint Mobilization: grade II-III AP to C3 spinous process with diaphragmatic breathing w/MFR Myofascial Release: Supine and Prone to Bil adductors, Bil hamstrings, Rt pudendal and external obturator externus to decrease fascial restrictions and improve mobility.  Other Manual Therapy: static hold with pressure to C3 rt transverse process with Lt cervical rotation Traction Type of Traction: Cervical Min (lbs): 10 Max (lbs): 15 Hold Time: Static Rest Time: Static  Time: 17 minutes  Physical Therapy Assessment and Plan PT Assessment and Plan Clinical Impression Statement: Pt continues to have significant lateral displacement of C3 TP to the Lt.  Added cervical traction at end of session with patient reporting significant reduction in pain, however continues to have a feeling of tingling and feeling blocked.  PT Plan: Add POE activities and prone scapular strengthening for cervical.     Goals Home Exercise Program Pt will Perform Home Exercise Program: Independently PT Goal: Perform Home  Exercise Program - Progress: Met PT Short Term Goals Time to Complete Short Term Goals: 4 weeks PT Short Term Goal 1: Pt will report neck pain less than 3/10 for 75% of her day.  PT Short Term Goal 1 - Progress: Progressing toward goal PT Short Term Goal 2: Pt will improve her cervical AROM to WNL. PT Short Term Goal 2 - Progress: Met PT Short Term Goal 3: Pt will present with mild fascial restrictions to cervical region for improved QOL.  PT Short Term Goal 3 - Progress: Progressing toward goal PT Short Term Goal 4: Pt will improve shoulder and cervical stabalizer strength in order to maintain approprriate cervical posture to decrease risk of further injury.  PT Short Term Goal 4 - Progress: Progressing toward goal PT Long Term Goals PT Long Term Goal 1: Pt will be independent with self care and neuro re-ed for her PFM.  PT Long Term Goal 1 - Progress: Progressing toward goal PT Long Term Goal 2: Pt will present with minimal fascial restrictions to her PFM in order to report decreased pain with intercourse.  PT Long Term Goal 2 - Progress: Progressing toward goal Long Term Goal 3: Pt will score a 45/56 on the berg balance test.  Long Term Goal 3 Progress: Progressing toward goal  Problem List Patient Active Problem List   Diagnosis Date Noted  . Cervical pain 08/25/2012  . Pain in joint, pelvic region and thigh 07/16/2012  . 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 Balance disorder 07/16/2012  . Personal history of fall 07/16/2012  . 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 H N P-LUMBAR 04/23/2010  . BACK PAIN 01/28/2010    PT - End of Session Activity Tolerance: Patient tolerated treatment well General Behavior During Therapy: WFL for tasks assessed/performed Cognition: WFL for tasks performed  GP    Jenisse Vullo, MPT, ATC 09/09/2012, 10:51 AM

## 2012-09-11 LAB — URINE CULTURE: Colony Count: >=100000

## 2012-09-13 NOTE — Telephone Encounter (Signed)
Left message. JSY 

## 2012-09-14 ENCOUNTER — Ambulatory Visit (HOSPITAL_COMMUNITY)
Admission: RE | Admit: 2012-09-14 | Discharge: 2012-09-14 | Disposition: A | Discharge status: Home / Self Care | Payer: Medicaid Other | Source: Ambulatory Visit | Attending: Internal Medicine | Admitting: Internal Medicine

## 2012-09-14 ENCOUNTER — Ambulatory Visit (HOSPITAL_COMMUNITY): Payer: Medicaid Other | Admitting: Physical Therapy

## 2012-09-14 DIAGNOSIS — IMO0001 Reserved for inherently not codable concepts without codable children: Secondary | ICD-10-CM | POA: Insufficient documentation

## 2012-09-14 DIAGNOSIS — M545 Low back pain, unspecified: Secondary | ICD-10-CM | POA: Insufficient documentation

## 2012-09-14 DIAGNOSIS — M25559 Pain in unspecified hip: Secondary | ICD-10-CM | POA: Insufficient documentation

## 2012-09-14 DIAGNOSIS — M6281 Muscle weakness (generalized): Secondary | ICD-10-CM | POA: Insufficient documentation

## 2012-09-14 NOTE — Progress Notes (Addendum)
Physical Therapy Treatment Patient Details  Name: Melissa Pruitt MRN: [id number] Date of Birth: 10-Dec-1975  Today's Date: 09/14/2012 Time: 0865-7846 PT Time Calculation (min): 62 min Charges: TE: [phone] Manual: [phone] Ice: [phone] Visit#: 18 of 27  Re-eval: 10/09/12    Authorization: [facility]  Authorization Time Period:    Authorization Visit#: 16 of     Subjective: Symptoms/Limitations Symptoms: Pt reported difficulty sleeping last night, daughter woke up with sick stomach at 4:00 this morning.  She states that she is not going to recieve injections to her neck as she has to move to [place] at the end of June and has upcoming graduation.  She feels that it will not benefit her.  She will continue with injections from Dr. Sherryll Burger to her lumbar facet joints.  Pain Assessment Currently in Pain?: Yes Pain Score:   7 Pain Location: Back Pain Orientation: Lower;Left;Right  Precautions/Restrictions     Exercise/Treatments Stretches Prone Mid Back Stretch: 1 rep;60 seconds Piriformis Stretch: 2 reps;30 seconds Supine Clam: 5 reps;5 seconds Bent Knee Raise: 10 reps;5 seconds  Modalities Modalities: Cryotherapy Manual Therapy Myofascial Release: Supine External  to Bil adductors, Bil hamstrings, Rt pudendal and external obturator externus to decrease fascial restrictions and improve mobility (completed by PTA CC, [phone]).  Supine: External: Strain Counter Strain to Rt adductor insertion with stretch after holds, nternal: Strain Counter Strain with stretch after to Level 1: 8-9 O'clock, Level 3 Obturator Internus (Completed by PT L.M> 9:22-947) Other Manual Therapy: SI check for alignment Cryotherapy Number Minutes Cryotherapy: 10 Minutes Cryotherapy Location: Back (Prone) Type of Cryotherapy: Ice pack  Physical Therapy Assessment and Plan PT Assessment and Plan Clinical Impression Statement: Pt has significant improvement in overall external fascial mobilty and  has greatest restriction to her Rt adductor insertion. Continues to have restrictions throughout Rt pelvic floor, however has improved overall flexibility and movement.  Has signficant reduction in pain when pressure is applied to Rt sacrotuberous ligament externally. Continues to demonstrate significant reduction in pain after manual therapy and exercise and is able to maintain approprriate pelvic/spinal alignment with improved coordination to core muscles. PT Plan: F/U with [facility] about [facility]. Continue with stretching and strengthening to cervical spine and manual technqiues and traction as neede.d.     Goals Home Exercise Program Pt will Perform Home Exercise Program: Independently PT Goal: Perform Home Exercise Program - Progress: Met PT Short Term Goals Time to Complete Short Term Goals: 4 weeks PT Short Term Goal 1: Pt will report neck pain less than 3/10 for 75% of her day.  PT Short Term Goal 1 - Progress: Progressing toward goal PT Short Term Goal 2: Pt will improve her cervical AROM to WNL. PT Short Term Goal 2 - Progress: Met PT Short Term Goal 3: Pt will present with mild fascial restrictions to cervical region for improved QOL.  PT Short Term Goal 3 - Progress: Progressing toward goal PT Short Term Goal 4: Pt will improve shoulder and cervical stabalizer strength in order to maintain approprriate cervical posture to decrease risk of further injury.  PT Short Term Goal 4 - Progress: Progressing toward goal PT Long Term Goals PT Long Term Goal 1: Pt will be independent with self care and neuro re-ed for her PFM.  PT Long Term Goal 1 - Progress: Progressing toward goal PT Long Term Goal 2: Pt will present with minimal fascial restrictions to her PFM in order to report decreased pain with intercourse.  PT Long  Term Goal 2 - Progress: Progressing toward goal Long Term Goal 3: Pt will score a 45/56 on the berg balance test.  (not being addressed at this time due  to cervical and pelvic ) Long Term Goal 3 Progress: Discontinued (comment)  Problem List Patient Active Problem List   Diagnosis Date Noted  . Cervical pain 08/25/2012  . Pain in joint, pelvic region and thigh 07/16/2012  . 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 Balance disorder 07/16/2012  . Personal history of fall 07/16/2012  . 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 H N P-LUMBAR 04/23/2010  . BACK PAIN 01/28/2010    PT - End of Session Activity Tolerance: Patient tolerated treatment well General Behavior During Therapy: WFL for tasks assessed/performed Cognition: WFL for tasks performed PT Plan of Care PT Patient Instructions: manual techniques for cervical.  Consulted and Agree with Plan of Care: Patient  GP    Nameer Summer, MPT, ATC 09/14/2012, 10:11 AM

## 2012-09-15 NOTE — Telephone Encounter (Signed)
Spoke with pt. GC/CHL was negative. Urine culture showed enterococcus species and Dr. Despina Hidden gave a Rx for Cipro so pt was advised to start that. Pt voiced understanding. JSY

## 2012-09-16 ENCOUNTER — Ambulatory Visit (HOSPITAL_COMMUNITY)
Admission: RE | Admit: 2012-09-16 | Discharge: 2012-09-16 | Disposition: A | Discharge status: Home / Self Care | Payer: Medicaid Other | Source: Ambulatory Visit | Attending: Internal Medicine | Admitting: Internal Medicine

## 2012-09-16 DIAGNOSIS — M542 Cervicalgia: Secondary | ICD-10-CM

## 2012-09-16 NOTE — Progress Notes (Signed)
Physical Therapy Treatment Patient Details  Name: Melissa Pruitt MRN: [id number] Date of Birth: 01-Aug-1975  Today's Date: 09/16/2012 Time: 4098-1191 PT Time Calculation (min): 90 min Charge: Self care x 10' (0850-0900), TE 8' ([phone]) Massage 14'' [phone]), Traction x 1 [phone])  Visit#: 19 of 27  Re-eval: 10/09/12    Authorization: [facility]  Authorization Time Period: submitted for 16 visits.   Authorization Visit#: 19 of 16   Subjective: Symptoms/Limitations Symptoms: Pt reported neck is killing her today, pain scale 10+/10 Lt side cervical. Pain Assessment Currently in Pain?: Yes Pain Score: 10-Worst pain ever Pain Location: Neck Pain Orientation: Left  Objective:   Exercise/Treatments  Prone Exercises W Back: 5 reps Shoulder Extension: 5 reps Rows: 5 reps Other Prone Exercise: POE serratus anterior 5x  Modalities Modalities: Traction Manual Therapy Manual Therapy: Massage Myofascial Release: Supine STM and MFR to cervical region focus on Lt side, SCM and mastoid proceses region Moist Heat Therapy Number Minutes Moist Heat: 15 Minutes Moist Heat Location: Other (comment) (neck) Traction Type of Traction: Cervical Max (lbs): 15 Hold Time: Static Time: 17 minutes  Physical Therapy Assessment and Plan PT Assessment and Plan Clinical Impression Statement: Discussed changes with insurance and financial benefits pt signed statement indicaing she is responsible for all therapy payments. Began prone scapular strengthening exercises with min tactile cueing for serratus anterior movements. Pt able to demonstrate appropriate technique following cueing and given HEP worksheet to add to HEP. Manual soft tissue massage and MFR complete to cervical reduce spasms, decrease pain and improve cervical ROM. Continued with static cervical traction, add MHP for musculature relaxation. Pt reported pain reduced to 7/10 at end of session PT Plan: Continue with current  POC if pt able to afford treatments.  Continue with stretching and strengthening to cervical spine and manual technqiues and traction as neede.d.     Goals    Problem List Patient Active Problem List   Diagnosis Date Noted  . Cervical pain 08/25/2012  . Pain in joint, pelvic region and thigh 07/16/2012  . 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 Balance disorder 07/16/2012  . Personal history of fall 07/16/2012  . 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 H N P-LUMBAR 04/23/2010  . BACK PAIN 01/28/2010    PT - End of Session Activity Tolerance: Patient tolerated treatment well General Behavior During Therapy: WFL for tasks assessed/performed Cognition: WFL for tasks performed PT Plan of Care PT Patient Instructions: Informed new change with insurance and [place], pt signed agreement for fully responsible for all treatments following 3 sessions  GP    Melissa Pruitt 09/16/2012, 1:14 PM

## 2012-09-21 ENCOUNTER — Ambulatory Visit (HOSPITAL_COMMUNITY)
Admission: RE | Admit: 2012-09-21 | Discharge: 2012-09-21 | Disposition: A | Discharge status: Home / Self Care | Payer: Medicaid Other | Source: Ambulatory Visit | Attending: Internal Medicine | Admitting: Internal Medicine

## 2012-09-21 DIAGNOSIS — M542 Cervicalgia: Secondary | ICD-10-CM

## 2012-09-21 NOTE — Progress Notes (Addendum)
Physical Therapy Treatment Patient Details  Name: Melissa Pruitt MRN: [id number] Date of Birth: January 24, 1976  Today's Date: 09/21/2012 Time: 0900-0932 PT Time Calculation (min): 32 min Charge Manual 40 [phone])  Visit#: 21 of 27  Re-eval: 10/09/12 Assessment Diagnosis: Pelvic pain Prior Therapy: Currently recieving for pelvic floor pain.   Authorization: [facility]  Authorization Time Period: submitted for 16 visits.   Authorization Visit#: 21 of 16   Subjective: Symptoms/Limitations Symptoms: Pt reported productive weekend, painted new studio and has increased pain to Rt flank, LBP and swollen LE. Pain Assessment Currently in Pain?: Yes Pain Score:   9 Pain Location: Back Pain Orientation: Right;Lower;Medial  Precautions/Restrictions  Precautions Precautions: Fall  Exercise/Treatments    Manual: Supine suboccipital release, manual cervical traction, STM and MFR to cervical region with focus on Lt side, SCM, and mastoid progress region   Physical Therapy Assessment and Plan PT Assessment and Plan Clinical Impression Statement: MET for Rt SI anterior rotation f/b gait for SI alignment with improved hip mobility, improved gait mechanics and pain reduced by 3 levels (9/10 to 6/10).  Manual techniques to reduce fascial restricitons following gait and stretches. PT Plan: Continue with current POC.  Continue with stretching and strengthening to cervical spine and manual technqiues and traction as needed.    Goals    Problem List Patient Active Problem List   Diagnosis Date Noted  . Cervical pain 08/25/2012  . Pain in joint, pelvic region and thigh 07/16/2012  . 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 Balance disorder 07/16/2012  . Personal history of fall 07/16/2012  . 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 H N P-LUMBAR  04/23/2010  . BACK PAIN 01/28/2010    PT - End of Session Activity Tolerance: Patient tolerated treatment well General Behavior During Therapy: WFL for tasks assessed/performed Cognition: WFL for tasks performed  GP    Juel Burrow 09/21/2012, 3:10 PM

## 2012-09-21 NOTE — Progress Notes (Addendum)
Physical Therapy Treatment Patient Details  Name: Melissa Pruitt MRN: [id number] Date of Birth: 02-May-1975  Today's Date: 09/21/2012 Time: 0900-0932 PT Time Calculation (min): 32 min Charge ;Manual 20 0900-0920, TE 0920-0932  Visit#: 21 of 27  Re-eval: 10/09/12 Assessment Diagnosis: Pelvic pain Prior Therapy: Currently recieving for pelvic floor pain.   Authorization: [facility]  Authorization Time Period: submitted for 16 visits.   Authorization Visit#: 21 of 16   Subjective: Symptoms/Limitations Symptoms: Pt reported productive weekend, painted new studio and has increased pain to Rt flank, LBP and swollen LE. Pain Assessment Currently in Pain?: Yes Pain Score:   9 Pain Location: Back Pain Orientation: Right;Lower;Medial  Precautions/Restrictions  Precautions Precautions: Fall  Exercise/Treatments Stretches Prone Mid Back Stretch: 1 rep;60 seconds;Limitations Prone Mid Back Stretch Limitations: child's pose Piriformis Stretch: 2 reps;30 seconds Aerobic Tread Mill: 1.2-1.90mph incline 5 following MET x 7 minutes with knees bent for SI alignement    Physical Therapy Assessment and Plan PT Assessment and Plan Clinical Impression Statement: MET for Rt SI anterior rotation f/b gait for SI alignment with improved hip mobility, improved gait mechanics and pain reduced by 3 levels (9/10 to 6/10).  Manual techniques to reduce fascial restricitons following gait and stretches. PT Plan: Continue with current POC.  Continue with stretching and strengthening to cervical spine and manual technqiues and traction as needed.    Goals    Problem List Patient Active Problem List   Diagnosis Date Noted  . Cervical pain 08/25/2012  . Pain in joint, pelvic region and thigh 07/16/2012  . 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 Balance disorder 07/16/2012  . Personal history of fall 07/16/2012  . 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 OTHER SPECIFIED CONGENITAL  ANOMALY SPINAL CORD 05/08/2010  . 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 H N P-LUMBAR 04/23/2010  . BACK PAIN 01/28/2010    PT - End of Session Activity Tolerance: Patient tolerated treatment well General Behavior During Therapy: WFL for tasks assessed/performed Cognition: WFL for tasks performed  GP    Juel Burrow 09/21/2012, 3:09 PM

## 2012-09-23 ENCOUNTER — Ambulatory Visit (HOSPITAL_COMMUNITY)
Admission: RE | Admit: 2012-09-23 | Discharge: 2012-09-23 | Disposition: A | Discharge status: Home / Self Care | Payer: Medicaid Other | Source: Ambulatory Visit | Attending: Internal Medicine | Admitting: Internal Medicine

## 2012-09-23 NOTE — Progress Notes (Signed)
Physical Therapy Treatment Patient Details  Name: Melissa Pruitt MRN: [id number] Date of Birth: Dec 13, 1975  Today's Date: 09/23/2012 Time: 7829-5621 PT Time Calculation (min): 24 min  Visit#: 22 of 27  Re-eval: 10/09/12 Authorization: [facility]  Authorization Time Period: submitted for 16 visits.   Charges:  Manual therapy 23' [phone])  Subjective: Symptoms/Limitations Symptoms: Pt states she has most pain in her RT pudendal nerve region.  Pt. states she is in the process of moving and this is not helping her symptoms. Pain Assessment Currently in Pain?: Yes Pain Score:   6   Objective: Manual Therapy Manual Therapy: Myofascial release Myofascial Release: supine Rt. flank, Rt pudendal and adductor to reduce fascial restrictions; PROM for Rt piriformis and hip adductors in supine [phone]  Physical Therapy Assessment and Plan PT Assessment and Plan Clinical Impression Statement: No MET required today as SI in good alignment.  Most pain into RT pudendal region/hip adductor.  Focused on these regions with manual techniques.  Pt. is compliant with HEP.  Pain overall decreased at end of session with noted improvement in gait.  PT Plan: Continue POC; check SI alignment prior to treatment next visit and continue manual techniques as needed.     Problem List Patient Active Problem List   Diagnosis Date Noted  . Cervical pain 08/25/2012  . Pain in joint, pelvic region and thigh 07/16/2012  . 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 Balance disorder 07/16/2012  . Personal history of fall 07/16/2012  . 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 H N P-LUMBAR 04/23/2010  . BACK PAIN 01/28/2010    PT - End of Session Activity Tolerance: Patient tolerated treatment well General Behavior During Therapy: [facility] for tasks  assessed/performed Cognition: WFL for tasks performed   Lurena Nida, PTA/CLT 09/23/2012, 11:09 AM

## 2012-09-23 NOTE — Progress Notes (Signed)
Physical Therapy Treatment Patient Details  Name: Melissa Pruitt MRN: [id number] Date of Birth: Dec 20, 1975  Today's Date: 09/23/2012 Time: 4098-1191 PT Time Calculation (min): 23 min  Visit#: 22 of 27  Re-eval: 10/09/12 Authorization: [facility]  Authorization Time Period: submitted for 16 visits.   Charges:  Manual 23'   Subjective: Symptoms/Limitations Symptoms: Pt reports her cervical region is overall improving; mostly having pain on Lt side, sternocleidomastoid mm. Pain Assessment Currently in Pain?: Yes Pain Score:   2 Pain Location: Neck Pain Orientation: Left   Objective: Manual Therapy Other Manual Therapy: Supine suboccipital release, manual traction and myofascial techniques to Lt upper trap and origin of sternocleidomastoid muscle.  Physical Therapy Assessment and Plan PT Assessment and Plan Clinical Impression Statement: Overaall tightness in Lt cervical musculature, expecially the origin of SCM muscle.  Able to decrease adhesions/spasms however unable to completely resolve.  Overall reduction of pain with increased mobility at end of session. PT Plan: Continue per POC.     Problem List Patient Active Problem List   Diagnosis Date Noted  . Cervical pain 08/25/2012  . Pain in joint, pelvic region and thigh 07/16/2012  . 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 Balance disorder 07/16/2012  . Personal history of fall 07/16/2012  . 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 H N P-LUMBAR 04/23/2010  . BACK PAIN 01/28/2010    PT - End of Session Activity Tolerance: Patient tolerated treatment well General Behavior During Therapy: [facility] for tasks assessed/performed Cognition: WFL for tasks performed   Lurena Nida, PTA/CLT 09/23/2012, 12:48 PM

## 2012-09-28 ENCOUNTER — Ambulatory Visit (HOSPITAL_COMMUNITY)
Admission: RE | Admit: 2012-09-28 | Discharge: 2012-09-28 | Disposition: A | Discharge status: Home / Self Care | Payer: Medicaid Other | Source: Ambulatory Visit | Attending: Internal Medicine | Admitting: Internal Medicine

## 2012-09-28 DIAGNOSIS — M542 Cervicalgia: Secondary | ICD-10-CM

## 2012-09-28 NOTE — Progress Notes (Signed)
Physical Therapy Treatment Patient Details  Name: Melissa Pruitt MRN: [id number] Date of Birth: [birth year]  Today's Date: 09/28/2012 Time: 8295-6213 PT Time Calculation (min): 30 min Charge: Manual x 30'  Visit#: 24 of 27  Re-eval: 10/09/12    Authorization: [facility]  Authorization Time Period: submitted for 16 visits.   Authorization Visit#:   of     Subjective: Symptoms/Limitations Symptoms: Pt reported increased cervical and LBP following weekend of packing and moving.   Pain Assessment Currently in Pain?: Yes Pain Score:   7 Pain Location: Neck Pain Orientation: Left  Objective:  Exercise/Treatments Manual Therapy Myofascial Release: supine Rt. flank, Rt pudendal and adductor to reduce fascial restrictions; PROM for Rt piriformis and hip adductors in supine  Other Manual Therapy: Supine suboccipital release, manual traction and myofascial techniques to Lt upper trap and origin of sternocleidomastoid muscle  Physical Therapy Assessment and Plan PT Assessment and Plan Clinical Impression Statement: Noted significant reduction in overall tightness to Lt SCM, improved cervical ROM and pain reduced following manual techniques to cervical region.  Pt reported pain reduced to 5-6/10 at end of session.   PT Plan: Continue per POC.    Goals    Problem List Patient Active Problem List   Diagnosis Date Noted  . Cervical pain 08/25/2012  . Pain in joint, pelvic region and thigh 07/16/2012  . 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 Balance disorder 07/16/2012  . Personal history of fall 07/16/2012  . 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 H N P-LUMBAR 04/23/2010  . BACK PAIN 01/28/2010    PT - End of Session Activity Tolerance: Patient tolerated treatment well General Behavior During Therapy: WFL for tasks  assessed/performed Cognition: WFL for tasks performed  GP    Juel Burrow 09/28/2012, 7:46 PM

## 2012-09-28 NOTE — Progress Notes (Signed)
Physical Therapy Treatment Patient Details  Name: Melissa Pruitt MRN: [id number] Date of Birth: Jul 17, 1975  Today's Date: 09/28/2012 Time: 1478-2956 PT Time Calculation (min): 50 min Charge: Manual 38' [phone]), TE x 12' (0928-0940)  Visit#: 23 of 27  Re-eval: 10/09/12    Authorization: [facility]  Authorization Time Period: submitted for 16 visits.   Authorization Visit#:   of     Subjective: Symptoms/Limitations Symptoms: Increased Rt LE 10+/10 pain scale, Pt reported pain relief following manual for multiple days following.   Pain Assessment Currently in Pain?: Yes Pain Score: 10-Worst pain ever Pain Location: Back Pain Orientation: Right  Objective:   Exercise/Treatments Stretches Active Hamstring Stretch: 3 reps;30 seconds;Limitations Active Hamstring Stretch Limitations: manual Hip Flexor Stretch: 2 reps;60 seconds;Limitations Hip Flexor Stretch Limitations: BLE Piriformis Stretch: 3 reps;30 seconds  Manual Therapy Myofascial Release: supine Rt. flank, Rt pudendal and adductor to reduce fascial restrictions; PROM for Rt piriformis and hip adductors in supine   Physical Therapy Assessment and Plan PT Assessment and Plan Clinical Impression Statement: Continues manual techniques to reduce fascial restrictions to Rt flank, Rt pudendal nerve and adductors in supine, Lt sidelying and prone positions.  Pt with improve hip mobilty and pain reduced following manual and stretches at the end.  Reported pain reduced by 4 levels at end of session. PT Plan: Continue per POC.    Goals    Problem List Patient Active Problem List   Diagnosis Date Noted  . Cervical pain 08/25/2012  . Pain in joint, pelvic region and thigh 07/16/2012  . 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 Balance disorder 07/16/2012  . Personal history of fall 07/16/2012  . 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 UNSPEC  CONGN ANOMALY BRAIN SP CORD&NERV SYSTEM 05/08/2010  . H N P-LUMBAR 04/23/2010  . BACK PAIN 01/28/2010    PT - End of Session Activity Tolerance: Patient tolerated treatment well General Behavior During Therapy: WFL for tasks assessed/performed Cognition: WFL for tasks performed  GP    Juel Burrow 09/28/2012, 7:41 PM

## 2012-09-30 ENCOUNTER — Ambulatory Visit (HOSPITAL_COMMUNITY)
Admission: RE | Admit: 2012-09-30 | Discharge: 2012-09-30 | Disposition: A | Discharge status: Home / Self Care | Payer: Medicaid Other | Source: Ambulatory Visit | Attending: Internal Medicine | Admitting: Internal Medicine

## 2012-09-30 NOTE — Progress Notes (Signed)
Physical Therapy Treatment Patient Details  Name: Melissa Pruitt MRN: [id number] Date of Birth: [birth year]  Today's Date: 09/30/2012 Time: 4098-1191 PT Time Calculation (min): 42 min Charge:  Manual 23' [phone] and 0915-0930), Therex 17' [phone])  Visit#: 25 of 27  Re-eval: 10/09/12    Authorization: [facility]  Authorization Time Period:    Authorization Visit#:   of     Subjective: Symptoms/Limitations Symptoms: Reported having to get on her hands and knees cleaning up new house, sttated inreased LBP today rated 9/10.  Pain Assessment Currently in Pain?: Yes Pain Score:   9 Pain Location: Back Pain Orientation: Right  Objective:   Exercise/Treatments Stretches Quadruped Mid Back Stretch: 2 reps;60 seconds;Limitations Quadruped Mid Back Stretch Limitations: child's pose Piriformis Stretch: 2 reps;30 seconds Aerobic Tread Mill: 1.5 x 2'30" forwards with knees bent, 1.2 x 2'30" backwards knees bent following MET  Manual Therapy Myofascial Release: Prone Rt flank, Rt pudendal and adductor to reduce fascial restrictions (manual [phone]) Other Manual Therapy: MET for Lt anterior rotation f/b gait training on TM x 5 minutes (manual from [phone])  Physical Therapy Assessment and Plan PT Assessment and Plan Clinical Impression Statement: MET for Lt anterior rotation f/b gait training to set alignment with noted improved hip mobility, gait mechanics and pain reduced to 6/10 following manual.  Pt continues to have significant fascial restrictions, able to reduce 20% on Rt flank and adductors.   PT Plan: Continue per POC.    Goals    Problem List Patient Active Problem List   Diagnosis Date Noted  . Cervical pain 08/25/2012  . Pain in joint, pelvic region and thigh 07/16/2012  . 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 Balance disorder 07/16/2012  . Personal history of fall 07/16/2012  . 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 OTHER SPECIFIED  CONGENITAL ANOMALY SPINAL CORD 05/08/2010  . 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 H N P-LUMBAR 04/23/2010  . BACK PAIN 01/28/2010    PT - End of Session Activity Tolerance: Patient tolerated treatment well General Behavior During Therapy: WFL for tasks assessed/performed Cognition: WFL for tasks performed  GP    Juel Burrow 09/30/2012, 12:10 PM

## 2012-10-05 ENCOUNTER — Ambulatory Visit (HOSPITAL_COMMUNITY)
Admission: RE | Admit: 2012-10-05 | Discharge: 2012-10-05 | Disposition: A | Discharge status: Home / Self Care | Payer: Medicaid Other | Source: Ambulatory Visit | Attending: Internal Medicine | Admitting: Internal Medicine

## 2012-10-05 NOTE — Progress Notes (Signed)
Physical Therapy Treatment Patient Details  Name: Melissa Pruitt MRN: [id number] Date of Birth: 01/23/1976  Today's Date: 10/05/2012 Time: 4098-1191 PT Time Calculation (min): 47 min Charges: Self Care: 843-900 Manual: 900-930 Visit#: 26 of 27  Re-eval: 10/09/12    Authorization: [facility]  Authorization Time Period: submitted for 16 visits.   Authorization Visit#:   of     Subjective: Symptoms/Limitations Symptoms: Reports that her neck is feeling better and is working on it a little better.  My back and legs have been sore from all the traveling and moving.   Precautions/Restrictions     Exercise/Treatments Manual Therapy Myofascial Release: PRONE: Rt side: gastroc to Rt flank with connective tissue release and soft tissue massage to decrease fascial restictions  Physical Therapy Assessment and Plan PT Assessment and Plan Clinical Impression Statement: Looked up proper seating cushions for pt to purchase to help with pelvic pain.  Discussed f/u with Dr. Sherryll Burger in July to discuss pelvic pain conservative treatments. Pt continues to demonstrate mild-moderate fascial restrictions to Rt lumbar and adductor/gluteal region.  Pt has decreased pain at end of session.  PT Plan: Continue to address cervical and lumbar/pelvic pain.     Goals    Problem List Patient Active Problem List   Diagnosis Date Noted  . Cervical pain 08/25/2012  . Pain in joint, pelvic region and thigh 07/16/2012  . 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 Balance disorder 07/16/2012  . Personal history of fall 07/16/2012  . 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 H N P-LUMBAR 04/23/2010  . BACK PAIN 01/28/2010    PT - End of Session Activity Tolerance: Patient tolerated treatment well General Behavior During Therapy: WFL for tasks assessed/performed Cognition:  WFL for tasks performed PT Plan of Care PT Patient Instructions: discussed pelvic pain treatment options to discuss with MD, researched cushions.  Consulted and Agree with Plan of Care: Patient  GP    Marchelle Rinella 10/05/2012, 9:36 AM

## 2012-10-07 ENCOUNTER — Ambulatory Visit (HOSPITAL_COMMUNITY)
Admission: RE | Admit: 2012-10-07 | Discharge: 2012-10-07 | Disposition: A | Discharge status: Home / Self Care | Payer: Medicaid Other | Source: Ambulatory Visit | Attending: Internal Medicine | Admitting: Internal Medicine

## 2012-10-07 NOTE — Progress Notes (Signed)
Physical Therapy Treatment Patient Details  Name: Melissa Pruitt MRN: [id number] Date of Birth: May 17, 1975  Today's Date: 10/07/2012 Time: 4696-2952 PT Time Calculation (min): 27 min  Visit#: 27 of 27  Re-eval: 10/09/12 Charges: Manual x 23' (0907-0930)  Authorization: [facility]  Authorization Time Period: submitted for 16 visits.     Subjective: Symptoms/Limitations Symptoms: Pt states that her neck is really hurting today. Pain Assessment Currently in Pain?: Yes Pain Score: 10-Worst pain ever Pain Location: Neck Pain Orientation: Left   Manual Therapy Massage: Supine suboccipital release, STM and MFR to cervical region with focus on Lt side, SCM, and mastoid progress region  Physical Therapy Assessment and Plan PT Assessment and Plan Clinical Impression Statement: Tx focus on decreasing cervical pain. Manual techniques completed to left cervical area to decrease pain and tightness. Pt presents with significant tightness in sternocleidomastoid and sub occipitals. Tightness decreased with manual techniques. Pt reports pain decrease to 7/10. PT Plan: Reassess next session.     Problem List Patient Active Problem List   Diagnosis Date Noted  . Cervical pain 08/25/2012  . Pain in joint, pelvic region and thigh 07/16/2012  . 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 Balance disorder 07/16/2012  . Personal history of fall 07/16/2012  . 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 H N P-LUMBAR 04/23/2010  . BACK PAIN 01/28/2010    PT - End of Session Activity Tolerance: Patient tolerated treatment well General Behavior During Therapy: [facility] for tasks assessed/performed Cognition: WFL for tasks performed  Seth Bake, PTA  10/07/2012, 11:59 AM

## 2012-10-12 ENCOUNTER — Ambulatory Visit (HOSPITAL_COMMUNITY)
Admission: RE | Admit: 2012-10-12 | Discharge: 2012-10-12 | Disposition: A | Discharge status: Home / Self Care | Payer: Medicaid Other | Source: Ambulatory Visit | Attending: Internal Medicine | Admitting: Internal Medicine

## 2012-10-12 DIAGNOSIS — M6281 Muscle weakness (generalized): Secondary | ICD-10-CM | POA: Insufficient documentation

## 2012-10-12 DIAGNOSIS — M545 Low back pain, unspecified: Secondary | ICD-10-CM | POA: Insufficient documentation

## 2012-10-12 DIAGNOSIS — M542 Cervicalgia: Secondary | ICD-10-CM

## 2012-10-12 DIAGNOSIS — M25559 Pain in unspecified hip: Secondary | ICD-10-CM | POA: Insufficient documentation

## 2012-10-12 DIAGNOSIS — IMO0001 Reserved for inherently not codable concepts without codable children: Secondary | ICD-10-CM | POA: Insufficient documentation

## 2012-10-12 NOTE — Progress Notes (Signed)
Physical Therapy Treatment Patient Details  Name: Melissa Pruitt MRN: [id number] Date of Birth: [birth year]  Today's Date: 10/12/2012 Time: 4098-1191 PT Time Calculation (min): 37 min Charge: Manual 4782-9562; MHP 0930-0940  Visit#: 29 of 27  Re-eval: 10/09/12 (Re-eval next session with evaluated PT) Assessment Diagnosis: Pelvic pain Next MD Visit: Dr. Threasa Beards -  Prior Therapy: Currently recieving for pelvic floor pain.   Authorization: [facility]  Authorization Time Period:    Authorization Visit#:   of     Subjective: Symptoms/Limitations Symptoms: Pt reported increased Lt cervical pain following move over weekend. Pain Assessment Currently in Pain?: Yes Pain Score:   7 Pain Location: Neck Pain Orientation: Left  Precautions/Restrictions  Precautions Precautions: Fall  Exercise/Treatments Manual Therapy Manual Therapy: Massage Massage: Supine suboccipital release, STM and MFR to cervical region with focus on Lt side, SCM, and mastoid progress region 1308-6578 Myofascial Release: PRONE:  Pressure points to Rt Pudendal nerve, MFR to Rt flank to gastroc with MFR, connective tissue release and soft tissue massage to decrease fascial restrictions. 4696-2952 Other Manual Therapy: MET for Lt anterior rotation f/b gait training on TM x 8 minutes (8413-2440) Moist Heat Therapy Number Minutes Moist Heat: 10 Minutes Moist Heat Location: Other (comment) (neck)  Physical Therapy Assessment and Plan PT Assessment and Plan Clinical Impression Statement: Manual techniques to reduce pain, fascial restrictions and spasms to cervical region with focus on Lt side SCM, mastoid process and suboccipitial region.  Ended session with MHP for pain control.  Pt encourage to drink extra water following session to reduce risk of headaches.  Pt reported pain reduced at end of session. PT Plan: Reassess next session.    Goals    Problem List Patient Active Problem List   Diagnosis Date  Noted  . Cervical pain 08/25/2012  . Pain in joint, pelvic region and thigh 07/16/2012  . 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 Balance disorder 07/16/2012  . Personal history of fall 07/16/2012  . 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 H N P-LUMBAR 04/23/2010  . BACK PAIN 01/28/2010    PT - End of Session Activity Tolerance: Patient tolerated treatment well General Behavior During Therapy: WFL for tasks assessed/performed Cognition: WFL for tasks performed  GP    Juel Burrow 10/12/2012, 10:49 AM

## 2012-10-12 NOTE — Progress Notes (Signed)
Physical Therapy Treatment Patient Details  Name: Melissa Pruitt MRN: [id number] Date of Birth: [birth year]  Today's Date: 10/12/2012 Time: 0808-0900 PT Time Calculation (min): 52 min Charge: TE 9: 4098-1191; Manual 43 [id number], 0835-0900  Visit#: 28 of 27  Re-eval: 10/09/12 (Next session with evaluated PT)    Authorization: [facility]  Authorization Time Period:    Authorization Visit#:   of     Subjective: Symptoms/Limitations Symptoms: Pt reported increased LBP following move, been sitting alot Pain Assessment Currently in Pain?: Yes Pain Score: 10-Worst pain ever Pain Location: Back Pain Orientation: Lower  Objective:  Exercise/Treatments Stretches Hip Flexor Stretch: 1 rep;30 seconds;Limitations;3 reps Hip Flexor Stretch Limitations: prone with manual stretch x1 Bil LE, supine edge of mat x 2 Bil LE; standing x 1 Bil LE Piriformis Stretch: 2 reps;30 seconds Aerobic Tread Mill: 1.5 x 4' forwards and backwards with knees bent following MET  Manual Therapy Myofascial Release: PRONE:  Pressure points to Rt Pudendal nerve, MFR to Rt flank to gastroc with MFR, connective tissue release and soft tissue massage to decrease fascial restrictions. 1308-6578 Other Manual Therapy: MET for Lt anterior rotation f/b gait training on TM x 8 minutes (4696-2952)  Physical Therapy Assessment and Plan PT Assessment and Plan Clinical Impression Statement: MET for Lt anterior rotation with pain reduced, improved gait mechanics and improved pelvic mobility.  Manual techniques to reduce pain Rt flank, gluteal and pudendal nerve with pain relief stated following.   PT Plan: Reassess next session.    Goals    Problem List Patient Active Problem List   Diagnosis Date Noted  . Cervical pain 08/25/2012  . Pain in joint, pelvic region and thigh 07/16/2012  . 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 Balance disorder 07/16/2012  . Personal history of fall 07/16/2012  .  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 H N P-LUMBAR 04/23/2010  . BACK PAIN 01/28/2010    PT - End of Session Activity Tolerance: Patient tolerated treatment well General Behavior During Therapy: WFL for tasks assessed/performed Cognition: WFL for tasks performed  GP    Juel Burrow 10/12/2012, 10:39 AM

## 2012-10-13 ENCOUNTER — Ambulatory Visit (HOSPITAL_COMMUNITY)
Admission: RE | Admit: 2012-10-13 | Discharge: 2012-10-13 | Disposition: A | Discharge status: Home / Self Care | Payer: Medicaid Other | Source: Ambulatory Visit | Attending: Internal Medicine | Admitting: Internal Medicine

## 2012-10-13 NOTE — Evaluation (Signed)
Physical Therapy Evaluation  Patient Details  Name: Melissa Pruitt MRN: [id number] Date of Birth: [birth year]  Today's Date: 10/13/2012 Time: 2130-8657 PT Time Calculation (min): 43 min Charges: 1 ROM Self Care: 848-918 Manual: 846-962             Visit#: 30 of 38  Re-eval: 11/12/12 (Re-eval next session with evaluated PT) Assessment Diagnosis: Pelvic pain Next MD Visit: Dr. Darlin Priestly and Dr. Sherryll Burger  Authorization: [facility]    Authorization Time Period:    Authorization Visit#:   of     Past Medical History:  Past Medical History  Diagnosis Date  . 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sacro/coccygeal mass excision  . Cervical cancer     removed in July 2009   Past Surgical History:  Past Surgical History  Procedure Laterality Date  . Back surgery    . Bladder surgery    . Colostomy      after flex sig as 37 year old.   . Tubal ligation    . Removal of cervical cancer    . Colostomy reversal    . Exploratory laparotomy      with colostomy placement    Subjective Symptoms/Limitations Symptoms: Pt reports that she has improved greatly with her flexibility to her legs and back.  Continues to have signfiicant pain and tenderness to her neck.  Precautions/Restrictions  Precautions Precautions: Fall  Assessment Cervical AROM Cervical - Right Side Bend: 13  (was 11.5) Cervical - Left Side Bend: 13 (was 11.5) Cervical - Right Rotation: 15 (was 12) Cervical - Left Rotation: 16 (was 16) Palpation Palpation: C2 Lt transverese process at level of mastoid process without significant gains of re-alignment, continues to have difficulty with her L SCM causing increased pain.  Has minimal restrictions to adductors, hamstrings, and gluteal region, remains moderate with scar tissue to lumbar region  Exercise/Treatments Mobility/Balance  Ambulation/Gait Ambulation/Gait: Yes Assistive device: Straight cane Gait Pattern: Lateral hip instability   Physical Therapy  Assessment and Plan PT Assessment and Plan Clinical Impression Statement: Ms. Owen has attended 30 OP PT visits to address pelvic floor pain and cervical pain with the following findings: have not made gains with cervical pain or ROM.  Pt may benefit from an open mouth x-ray and PA x-ray to determine level of C2-3 Lt transverse process.  have utilized multiple manual tecniques and exercises to decreae pain without significant results to cervical region.  Pt has improved signficiant with decreased fascial restrictions, improved flexibility, improved core strength, LE flexibility, and has been educated on proper techniques to help mainitain her spinal/pelvic alignment and is performing self massage where able.  At this time will continue to address lumbar pain 1x/week x4 weeks.  Pt has did have benefits from occipital nerve blocks in the past.  Continues to f/u with Dr. Laury Axon for facet injections which are currently postponed due to difficulty with transportation.  PT Plan: Continue with lumbar manual therapy and scar tissue release.  F/u with MD apt.     Goals Home Exercise Program Pt/caregiver will Perform Home Exercise Program: independently PT Short Term Goals Time to Complete Short Term Goals: 4 weeks PT Short Term Goal 1: Pt will report neck pain less than 3/10 for 75% of her day.  (7/10) PT Short Term Goal 1 - Progress: Not met PT Short Term Goal 2: Pt will improve her cervical AROM to WNL. PT Short Term Goal 2 - Progress: Not met (decreased rotation compared to  inital evaluation) PT Short Term Goal 3: Pt will present with mild fascial restrictions to cervical region for improved QOL.  PT Short Term Goal 3 - Progress: Not met PT Short Term Goal 4: Pt will improve shoulder and cervical stabalizer strength in order to maintain approprriate cervical posture to decrease risk of further injury.  PT Short Term Goal 4 - Progress: Progressing toward goal PT Long Term Goals Time to Complete Long  Term Goals: 8 weeks PT Long Term Goal 1: Pt will be independent with self care and neuro re-ed for her PFM.  PT Long Term Goal 1 - Progress: Met PT Long Term Goal 2: Pt will present with minimal fascial restrictions to her PFM in order to report decreased pain with intercourse.  PT Long Term Goal 2 - Progress: Met Long Term Goal 3:  (not being addressed at this time due to cervical and pelvic )  Problem List Patient Active Problem List   Diagnosis Date Noted  . Cervical pain 08/25/2012  . Pain in joint, pelvic region and thigh 07/16/2012  . 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 Balance disorder 07/16/2012  . Personal history of fall 07/16/2012  . 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 H N P-LUMBAR 04/23/2010  . BACK PAIN 01/28/2010    PT - End of Session Activity Tolerance: Patient tolerated treatment well General Behavior During Therapy: WFL for tasks assessed/performed Cognition: WFL for tasks performed PT Plan of Care PT Patient Instructions: educated pt MET self corrections using doorway.   GP    Elizeth Weinrich 10/13/2012, 5:08 PM  Physician Documentation Your signature is required to indicate approval of the treatment plan as stated above.  Please sign and either send electronically or make a copy of this report for your files and return this physician signed original.   Please mark one 1.__approve of plan  2. ___approve of plan with the following conditions.   ______________________________                                                          _____________________ Physician Signature                                                                                                             Date

## 2012-10-14 ENCOUNTER — Ambulatory Visit (HOSPITAL_COMMUNITY): Payer: Medicaid Other | Admitting: Physical Therapy

## 2012-10-19 ENCOUNTER — Ambulatory Visit (HOSPITAL_COMMUNITY): Payer: Medicaid Other

## 2012-10-21 ENCOUNTER — Ambulatory Visit (HOSPITAL_COMMUNITY)
Admission: RE | Admit: 2012-10-21 | Discharge: 2012-10-21 | Disposition: A | Discharge status: Home / Self Care | Payer: Medicaid Other | Source: Ambulatory Visit | Attending: Internal Medicine | Admitting: Internal Medicine

## 2012-10-21 ENCOUNTER — Ambulatory Visit (HOSPITAL_COMMUNITY): Payer: Medicaid Other

## 2012-10-21 NOTE — Progress Notes (Signed)
Physical Therapy Treatment Patient Details  Name: Melissa Pruitt MRN: [id number] Date of Birth: 05/26/1975  Today's Date: 10/21/2012 Time: 8413-2440 PT Time Calculation (min): 49 min Charge: Manual 29' 0845-0904, 0920-0930, Estim [phone], MHP x 1  Visit#: 31 of 38  Re-eval: 11/12/12 Assessment Diagnosis: Pelvic pain Next MD Visit: Dr. Darlin Priestly and Dr. Sherryll Burger  Authorization: [facility]  Authorization Time Period:    Authorization Visit#:   of     Subjective: Symptoms/Limitations Symptoms: Pt reported LBP and Rt LE pain scale 10+/10 today.  Pt reported neurologist found several cysts on sscral area on last MRI. Pain Assessment Currently in Pain?: Yes Pain Score: 10-Worst pain ever Pain Location: Back  Precautions/Restrictions  Precautions Precautions: Fall  Exercise/Treatments Modalities Modalities: Electrical Stimulation;Moist Heat Manual Therapy Manual Therapy: Myofascial release Myofascial Release: Sidelying [id number], PRONE [id number]  to lumbar scar tissue to decrease fascial restrictions and improve ROM: Total 29' Other Manual Therapy: Check for SI alignment, no MET required this session. Moist Heat Therapy Number Minutes Moist Heat: 10 Minutes Moist Heat Location: Other (comment) (Back) Electrical Stimulation Electrical Stimulation Location: Rt flank, lumbar and Rt gluteal area Electrical Stimulation Action: reduce pain Electrical Stimulation Parameters: Lt sidelying with pillow between knees; interfertial Hi/lo sweep  Electrical Stimulation Goals: Pain  Physical Therapy Assessment and Plan PT Assessment and Plan Clinical Impression Statement: SI within alignment this session, no MET required.  Continued manual techniques to reduce fascial restrictions.  Added estim with MHP to POC for pain control.  Pt reported reduction to 6-7/10 following new modality (decrease by 3 levels.) PT Plan: Continue with lumbar manual therapy and scar tissue release.  F/u with  MD apt and estim.    Goals    Problem List Patient Active Problem List   Diagnosis Date Noted  . Cervical pain 08/25/2012  . Pain in joint, pelvic region and thigh 07/16/2012  . 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 Balance disorder 07/16/2012  . Personal history of fall 07/16/2012  . 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 H N P-LUMBAR 04/23/2010  . BACK PAIN 01/28/2010    PT - End of Session Activity Tolerance: Patient tolerated treatment well General Behavior During Therapy: WFL for tasks assessed/performed Cognition: WFL for tasks performed  GP    Juel Burrow 10/21/2012, 12:51 PM

## 2012-10-26 ENCOUNTER — Ambulatory Visit (HOSPITAL_COMMUNITY): Payer: Medicaid Other

## 2012-10-28 ENCOUNTER — Ambulatory Visit (HOSPITAL_COMMUNITY)
Admission: RE | Admit: 2012-10-28 | Discharge: 2012-10-28 | Disposition: A | Discharge status: Home / Self Care | Payer: Medicaid Other | Source: Ambulatory Visit | Attending: Internal Medicine | Admitting: Internal Medicine

## 2012-10-28 NOTE — Progress Notes (Addendum)
Physical Therapy Treatment Patient Details  Name: Melissa Pruitt MRN: [id number] Date of Birth: Sep 06, 1975  Today's Date: 10/28/2012 Time: 0900-0945 PT Time Calculation (min): 45 min Charges Manual: 900-930 Estim/Heat: 930-945 Visit#: 32 of 38  Re-eval: 11/12/12    Authorization: [facility]  Authorization Time Period:    Authorization Visit#:   of     Subjective: Symptoms/Limitations Symptoms: Pt reports that Dr. Sherryll Burger is happy with her progress to her low back.  MD wishes for her to follow up with Dr. Alben Deeds to discuss injection to occipital and SCM. She has been performing self SI corrections, continuing with stretching and yoga and is using devices to help to control her pain.  States pain from the weekend when she was unpacking from the move. Reports that the e-stim helped a lot last visit.   Reports that she is getting a lot of relief with therapy and feels she is able to function at a higher level because her pain has been relieved long enough to function.  She plans on applying for disability this month.  Pain Assessment Currently in Pain?: Yes  Precautions/Restrictions     Exercise/Treatments Modalities Modalities: Electrical Stimulation;Moist Heat Manual Therapy Manual Therapy: Myofascial release Myofascial Release: Supine: Rt pudenal and external obturator internus, adductors and quadriceps, Prone: hamstrings, gluteal region, thoracic region.  To decrease fascial restrictions and decrease pain  Moist Heat Therapy Number Minutes Moist Heat: 15 Minutes Moist Heat Location: Other (comment) (Back) Electrical Stimulation Electrical Stimulation Location: Rt flank, lumbar and Rt gluteal area Electrical Stimulation Action: reduce pain Electrical Stimulation Parameters: Prone, interferential with heat, Hi/low sweep x15 minutes Electrical Stimulation Goals: Pain  Physical Therapy Assessment and Plan PT Assessment and Plan Clinical Impression Statement: Pt 15  minutes late due to traffic.  Discussed with pt about visit with MD and about continuing PT to help with pain relief.  had increased restrictions to Rt adductor, hamstring and pudenal region which decreased after manual therapy.  Pt continues to have decreased pain at end of session.  PT Plan: Continue with lumbar manual therapy and scar tissue release to Rt LE and gluteal to thoracic region.     Goals    Problem List Patient Active Problem List   Diagnosis Date Noted  . Cervical pain 08/25/2012  . Pain in joint, pelvic region and thigh 07/16/2012  . 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 Balance disorder 07/16/2012  . Personal history of fall 07/16/2012  . 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 H N P-LUMBAR 04/23/2010  . BACK PAIN 01/28/2010    PT - End of Session Activity Tolerance: Patient tolerated treatment well General Behavior During Therapy: WFL for tasks assessed/performed Cognition: WFL for tasks performed  GP    Jadesola Poynter, MPT ATC 10/28/2012, 9:48 AM

## 2012-11-02 ENCOUNTER — Ambulatory Visit (HOSPITAL_COMMUNITY): Payer: Medicaid Other | Admitting: Physical Therapy

## 2012-11-04 ENCOUNTER — Ambulatory Visit (HOSPITAL_COMMUNITY)
Admission: RE | Admit: 2012-11-04 | Discharge: 2012-11-04 | Disposition: A | Discharge status: Home / Self Care | Payer: Medicaid Other | Source: Ambulatory Visit | Attending: Internal Medicine | Admitting: Internal Medicine

## 2012-11-04 NOTE — Progress Notes (Signed)
Physical Therapy Treatment Patient Details  Name: Melissa Pruitt MRN: [id number] Date of Birth: 1976/03/08  Today's Date: 11/04/2012 Time: 4098-1191 PT Time Calculation (min): 46 min Visit#: 33 of 38  Re-eval: 11/12/12 Authorization: [facility]  Charges:  Manual [phone] (28'), IFES/MHP [phone] (15')  Subjective: Symptoms/Limitations Symptoms: Pt states her pain is still the same as last visit, however reports it felt much better after last session.  Pt states she is going to get a new hot pack to use at home as heat is also helping. Pain Assessment Currently in Pain?: Yes Pain Score: 8  Pain Location: Back Pain Orientation: Left   Objective: Modalities Modalities: Electrical Stimulation;Moist Heat Manual Therapy Manual Therapy: Myofascial release Myofascial Release: Supine: Rt pudenal and external obturator internus, adductors and quadriceps, Prone: hamstrings, gluteal region, thoracic region. To decrease fascial restrictions and decrease pain  Moist Heat Therapy Number Minutes Moist Heat: 15 Minutes Moist Heat Location: Other (comment) (Back) Electrical Stimulation Electrical Stimulation Location: Rt flank, lumbar and Rt gluteal area in prone postition with MHP Electrical Stimulation Action: pain reduction Electrical Stimulation Parameters: Prone, interferential with heat, Hi/low sweep x15 minutes Electrical Stimulation Goals: Pain  Physical Therapy Assessment and Plan PT Assessment and Plan Clinical Impression Statement: Noted tightness in Rt pudendal region and adductor that was decreased after manual techniques.  Much improved pain at end of session. PT Plan: Continue with lumbar manual therapy and scar tissue release to Rt LE and gluteal to thoracic region.   Check on signed order from Dr. Sherryll Burger for TENS unit.     Problem List Patient Active Problem List   Diagnosis Date Noted  . Cervical pain 08/25/2012  . Pain in joint, pelvic region and thigh 07/16/2012   . 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 Balance disorder 07/16/2012  . Personal history of fall 07/16/2012  . 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 H N P-LUMBAR 04/23/2010  . BACK PAIN 01/28/2010    PT - End of Session Activity Tolerance: Patient tolerated treatment well General Behavior During Therapy: [facility] for tasks assessed/performed Cognition: WFL for tasks performed   Lurena Nida, PTA/CLT 11/04/2012, 10:32 AM

## 2012-11-09 ENCOUNTER — Ambulatory Visit (HOSPITAL_COMMUNITY): Payer: Medicaid Other | Admitting: Physical Therapy

## 2012-11-11 ENCOUNTER — Ambulatory Visit (HOSPITAL_COMMUNITY)
Admission: RE | Admit: 2012-11-11 | Discharge: 2012-11-11 | Disposition: A | Discharge status: Home / Self Care | Payer: Medicaid Other | Source: Ambulatory Visit | Attending: Internal Medicine | Admitting: Internal Medicine

## 2012-11-11 NOTE — Progress Notes (Signed)
Physical Therapy Treatment Patient Details  Name: Melissa Pruitt MRN: [id number] Date of Birth: 02/28/1976  Today's Date: 11/11/2012 Time: 2841-3244 PT Time Calculation (min): 45 min Charge Manual [phone], Self care [phone]  Visit#: 34 of 38  Re-eval: 11/12/12    Authorization: [facility]  Authorization Time Period:    Authorization Visit#:   of     Subjective: Symptoms/Limitations Symptoms: Pt reports her pain is still the same as before. Pain Assessment Currently in Pain?: Yes Pain Score: 8  Pain Location: Back  Objective:   Exercise/Treatments Pt instructed use and issued TENS unit [phone]   Manual Therapy Myofascial Release: Prone: Rt pudenal, adductors and hamstrings, gluteal region and Rt flank, Supine Rt hip flexor to reduce fascial restrictions and decrease pain.  [phone]  Physical Therapy Assessment and Plan PT Assessment and Plan Clinical Impression Statement: Continued manual techniques to Rt pudendal region, adductors, gluteal and Rt flank to reduce fascial restrictions with pain relief stated following.  Pt instructed and issued TENS per MD order.   PT Plan: Re-eval next session.      Goals    Problem List Patient Active Problem List   Diagnosis Date Noted  . Cervical pain 08/25/2012  . Pain in joint, pelvic region and thigh 07/16/2012  . 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 Balance disorder 07/16/2012  . Personal history of fall 07/16/2012  . 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 H N P-LUMBAR 04/23/2010  . BACK PAIN 01/28/2010    PT - End of Session Activity Tolerance: Patient tolerated treatment well General Behavior During Therapy: WFL for tasks assessed/performed Cognition: WFL for tasks performed  GP    Juel Burrow 11/11/2012, 1:20 PM

## 2012-11-17 ENCOUNTER — Ambulatory Visit (HOSPITAL_COMMUNITY): Payer: Medicaid Other | Admitting: Physical Therapy

## 2012-11-18 ENCOUNTER — Ambulatory Visit (HOSPITAL_COMMUNITY)
Admission: RE | Admit: 2012-11-18 | Discharge: 2012-11-18 | Disposition: A | Discharge status: Home / Self Care | Payer: Medicaid Other | Source: Ambulatory Visit | Attending: Internal Medicine | Admitting: Internal Medicine

## 2012-11-18 DIAGNOSIS — M545 Low back pain, unspecified: Secondary | ICD-10-CM | POA: Insufficient documentation

## 2012-11-18 DIAGNOSIS — M25559 Pain in unspecified hip: Secondary | ICD-10-CM | POA: Insufficient documentation

## 2012-11-18 DIAGNOSIS — M6281 Muscle weakness (generalized): Secondary | ICD-10-CM | POA: Insufficient documentation

## 2012-11-18 DIAGNOSIS — IMO0001 Reserved for inherently not codable concepts without codable children: Secondary | ICD-10-CM | POA: Insufficient documentation

## 2012-11-18 NOTE — Progress Notes (Signed)
Physical Therapy Re-Evaluation  Patient Details  Name: Melissa Pruitt MRN: [id number] Date of Birth: [birth year]  Today's Date: 11/18/2012 Time: 0931-1015 PT Time Calculation (min): 44 min              Visit#: 35 of 39  Re-eval: 12/18/12    Authorization: [facility]    Authorization Time Period:    Authorization Visit#:   of     Subjective Symptoms/Limitations Symptoms: Reports she is very pleased with the TENS unit, Continues to work on her stretching and home manual.  Has questions about a home vaginal dilator to manage vaginal pain on her own.  Pain Assessment Currently in Pain?: Yes Pain Location: Back  Physical Therapy Assessment and Plan PT Assessment and Plan Clinical Impression Statement: Discussed with pt about continuing with therapy if she is still covered by [facility] discount x1 month to continue with manual therapy to low back region for pain control.  At this time she is very independent with all of her exercises and manual therapy using a softball and therapy wand.  Educated pt on home vaginal dilators, different ropes for stretching and strengthing for purchase and pro's vs cons of each.  Pt reports that she will call [employer] to find out if she is still covered for August.  If not she will continue on her own and will purchase home dilator.  She will likely continue next year at an outpatient facility closer to her home in [facility].   PT Frequency: Min 1X/week PT Duration: 4 weeks PT Treatment/Interventions: Manual techniques PT Plan: Continue with manual techniques to address low back pain, hip and adductor pain.     Goals   Home Exercise Program  Pt will Perform Home Exercise Program: Independently - Met PT Short Term Goals  Time to Complete Short Term Goals: 4 weeks  PT Short Term Goal 1: Pt will report pelvic pain less than 5/10 for 50% of her day. - Progressing towards PT Short Term Goal 2: Pt will present with decreased fascial restrictions to  external and internal pelvic floor region for improved QOL. - Met PT Long Term Goals  Time to Complete Long Term Goals: 8 weeks  PT Long Term Goal 1: Pt will be independent with self care and neuro re-ed for her PFM. Met PT Long Term Goal 2: Pt will present with minimal fascial restrictions to her PFM in order to report decreased pain with intercourse. Partly met  Problem List Patient Active Problem List   Diagnosis Date Noted  . Cervical pain 08/25/2012  . Pain in joint, pelvic region and thigh 07/16/2012  . 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 Balance disorder 07/16/2012  . Personal history of fall 07/16/2012  . 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 H N P-LUMBAR 04/23/2010  . BACK PAIN 01/28/2010    Maleigha Colvard, MPT, ATC 11/18/2012, 10:58 AM  Physician Documentation Your signature is required to indicate approval of the treatment plan as stated above.  Please sign and either send electronically or make a copy of this report for your files and return this physician signed original.   Please mark one 1.__approve of plan  2. ___approve of plan with the following conditions.   ______________________________  _____________________ Physician Signature                                                                                                             Date

## 2012-11-22 ENCOUNTER — Other Ambulatory Visit: Payer: Self-pay | Admitting: Obstetrics and Gynecology

## 2012-12-03 ENCOUNTER — Ambulatory Visit (HOSPITAL_COMMUNITY)
Admission: RE | Admit: 2012-12-03 | Discharge: 2012-12-03 | Disposition: A | Discharge status: Home / Self Care | Payer: Medicaid Other | Source: Ambulatory Visit | Attending: Internal Medicine | Admitting: Internal Medicine

## 2012-12-03 NOTE — Progress Notes (Signed)
Physical Therapy Treatment Patient Details  Name: Melissa Pruitt MRN: [id number] Date of Birth: May 08, 1975  Today's Date: 12/03/2012 Time: 1355-1430 PT Time Calculation (min): 35 min Charges: Self Care:1355-1400 Manual: 1400-1430 Visit#: 36 of 39  Re-eval: 12/18/12    Authorization: [facility]  Authorization Time Period:    Authorization Visit#:   of     Subjective: Symptoms/Limitations Symptoms: Pt reports that she continues to have a lot of pain to her pudendal region. She is using a TENS unit for her neck pain and has been using a mouth gaurd for pain.  Pain Assessment Currently in Pain?: Yes Pain Score: 6   Exercise/Treatments Manual Therapy Myofascial Release: Supine: Rt pudenal, adductors, hip flexors and quadricep  Physical Therapy Assessment and Plan PT Assessment and Plan Clinical Impression Statement: Provided education on websites to help assist with her pain and explaining pain.  Manual techniques to decrease fascial restrictions decreased 50% during this session.  Pt continues to be complaint with her HEP to include yoga for flexibility and strengthening and has utilized techniques given to her from her PT sessions to improve core strength and stability.  PT Frequency: Min 1X/week PT Duration: 4 weeks PT Treatment/Interventions: Manual techniques PT Plan: Continue with internal pelvic floor therapy as needed.     Goals    Problem List Patient Active Problem List   Diagnosis Date Noted  . Cervical pain 08/25/2012  . Pain in joint, pelvic region and thigh 07/16/2012  . 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 Balance disorder 07/16/2012  . Personal history of fall 07/16/2012  . 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 H N P-LUMBAR 04/23/2010  . BACK PAIN 01/28/2010       GP    Shanzay Hepworth, MPT, ATC 12/03/2012,  3:18 PM

## 2012-12-09 ENCOUNTER — Ambulatory Visit (HOSPITAL_COMMUNITY): Payer: Medicaid Other | Admitting: Physical Therapy

## 2012-12-10 ENCOUNTER — Ambulatory Visit (HOSPITAL_COMMUNITY)
Admission: RE | Admit: 2012-12-10 | Discharge: 2012-12-10 | Disposition: A | Discharge status: Home / Self Care | Payer: Medicaid Other | Source: Ambulatory Visit | Attending: Internal Medicine | Admitting: Internal Medicine

## 2012-12-10 NOTE — Progress Notes (Signed)
Physical Therapy Treatment Patient Details  Name: Melissa Pruitt MRN: [id number] Date of Birth: 29-Jun-1975  Today's Date: 12/10/2012 Time: 4098-1191 PT Time Calculation (min): 42 min Charges: Self Care: [phone] Manual: [phone] Visit#: 37 of 39  Re-eval: 12/24/12    Subjective: Symptoms/Limitations Symptoms: Pt reports that she continues with stretching and strengtheing,  Worries that she may be doing too much stretching because she is sore.  Reports chronic hx of burning knee pain to posterior region.  Pain Assessment Currently in Pain?: Yes Pain Score: 7  Pain Location:  (Pelvic floor)  Precautions/Restrictions     Exercise/Treatments  Manual Therapy Myofascial Release: Supine: Rt pudenal, adductors, hip flexors and quadricep.  Internal PF muscle OI and Layer 3 musculature w/ Strain counter strain to decrease pain.   Physical Therapy Assessment and Plan PT Assessment and Plan Clinical Impression Statement: Pt has significant reduction in pain at end of treatment today.  Discussed [employer] vaginal sensors for TENS unti, I will call on this.  Also provided handouts of pelvic floor musculature.  Pt has 100% reduction in PF spasms and 75% decrease in fascial restrictions after manual therapist.  PT Plan: Continue with internal pelvic floor therapy as needed.     Goals Home Exercise Program Pt/caregiver will Perform Home Exercise Program: Independently PT Goal: Perform Home Exercise Program - Progress: Met PT Short Term Goals Time to Complete Short Term Goals: 4 weeks PT Short Term Goal 1: Pt will report pelvic pain less than 5/10 for 50% of her day.  PT Short Term Goal 1 - Progress: Progressing toward goal PT Short Term Goal 2: Pt will present with decreased fascial restrictions to external and internal pelvic floor region for improved QOL.   PT Short Term Goal 2 - Progress: Progressing toward goal PT Long Term Goals Time to Complete Long Term Goals: 8 weeks PT Long Term Goal  1: Pt will be independent with self care and neuro re-ed for her PFM.  PT Long Term Goal 1 - Progress: Met PT Long Term Goal 2: Pt will present with minimal fascial restrictions to her PFM in order to report decreased pain with intercourse.  PT Long Term Goal 2 - Progress: Progressing toward goal  Problem List Patient Active Problem List   Diagnosis Date Noted  . Cervical pain 08/25/2012  . Pain in joint, pelvic region and thigh 07/16/2012  . 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 Balance disorder 07/16/2012  . Personal history of fall 07/16/2012  . 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 H N P-LUMBAR 04/23/2010  . BACK PAIN 01/28/2010       GP    Averie Meiner, MPT, ATC 12/10/2012, 10:28 AM

## 2012-12-23 ENCOUNTER — Ambulatory Visit (HOSPITAL_COMMUNITY)
Admission: RE | Admit: 2012-12-23 | Discharge: 2012-12-23 | Disposition: A | Discharge status: Home / Self Care | Payer: Medicaid Other | Source: Ambulatory Visit | Attending: Internal Medicine | Admitting: Internal Medicine

## 2012-12-23 DIAGNOSIS — M6281 Muscle weakness (generalized): Secondary | ICD-10-CM | POA: Insufficient documentation

## 2012-12-23 DIAGNOSIS — IMO0001 Reserved for inherently not codable concepts without codable children: Secondary | ICD-10-CM | POA: Insufficient documentation

## 2012-12-23 DIAGNOSIS — M545 Low back pain, unspecified: Secondary | ICD-10-CM | POA: Insufficient documentation

## 2012-12-23 DIAGNOSIS — M25559 Pain in unspecified hip: Secondary | ICD-10-CM | POA: Insufficient documentation

## 2012-12-23 NOTE — Progress Notes (Signed)
Physical Therapy Treatment Patient Details  Name: Melissa Pruitt MRN: [id number] Date of Birth: 1976-01-31  Today's Date: 12/23/2012 Time: 1350-1430 PT Time Calculation (min): 40 min Charges: Manual: 1350-1430 Visit#: 38 of 39  Re-eval: 12/24/12  Subjective: Symptoms/Limitations Symptoms: Pt reports that she has been ill for the last week with laryengitis and is feeling it everywhere.  States that her pelvic floor and low back and rt hip have been very painful. She is using her TENS unit which helps with her pain control.  She is talking with [employer] rep to recieve a home TENS for pelvic floor.   Precautions/Restrictions     Exercise/Treatments Manual Therapy Manual Therapy: Myofascial release Myofascial Release: Supine: to pelvic floor Layer 3 Rt obturator internus and external to Rt hip flexor musculature  Physical Therapy Assessment and Plan PT Assessment and Plan Clinical Impression Statement: Pt was able to independently demonstrate deep breathing and relaxation techniques to decrease pelvic floor pain during manual treatment and reports 2/10 pain at end of session and improved mobility.  Provided pt with a information for pelvic pain support ground in [place]. PT Plan: Continue with internal pelvic floor therapy as needed.     Goals Home Exercise Program Pt/caregiver will Perform Home Exercise Program: Independently PT Goal: Perform Home Exercise Program - Progress: Met PT Short Term Goals Time to Complete Short Term Goals: 4 weeks PT Short Term Goal 1: Pt will report pelvic pain less than 5/10 for 50% of her day.  PT Short Term Goal 1 - Progress: Progressing toward goal PT Short Term Goal 2: Pt will present with decreased fascial restrictions to external and internal pelvic floor region for improved QOL.   PT Short Term Goal 2 - Progress: Met PT Long Term Goals Time to Complete Long Term Goals: 8 weeks PT Long Term Goal 1: Pt will be independent with self care and neuro  re-ed for her PFM.  PT Long Term Goal 1 - Progress: Met PT Long Term Goal 2: Pt will present with minimal fascial restrictions to her PFM in order to report decreased pain with intercourse.  PT Long Term Goal 2 - Progress: Progressing toward goal  Problem List Patient Active Problem List   Diagnosis Date Noted  . Cervical pain 08/25/2012  . Pain in joint, pelvic region and thigh 07/16/2012  . 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 Balance disorder 07/16/2012  . Personal history of fall 07/16/2012  . 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 H N P-LUMBAR 04/23/2010  . BACK PAIN 01/28/2010    PT - End of Session Activity Tolerance: Patient tolerated treatment well PT Plan of Care PT Patient Instructions: discussed pelvic pain support group, TENS unit, pt used guided breathing by [name] and provided her with website.  Consulted and Agree with Plan of Care: Patient  GP    Swannie Milius, MPT, ATC 12/24/2012, 8:37 AM

## 2012-12-30 ENCOUNTER — Ambulatory Visit (HOSPITAL_COMMUNITY)
Admission: RE | Admit: 2012-12-30 | Discharge: 2012-12-30 | Disposition: A | Discharge status: Home / Self Care | Payer: Medicaid Other | Source: Ambulatory Visit | Attending: Internal Medicine | Admitting: Internal Medicine

## 2012-12-30 NOTE — Progress Notes (Signed)
Physical Therapy Treatment Patient Details  Name: Melissa Pruitt MRN: [id number] Date of Birth: 21-Aug-1975  Today's Date: 12/30/2012 Time: 4098-1191 PT Time Calculation (min): 42 min  Visit#: 39 of 42  Re-eval: 01/11/13    Authorization:    Authorization Time Period:    Authorization Visit#:   of     Subjective: Symptoms/Limitations Symptoms: Pt reports that she is now walking without her cane since Tuesday due to her increased foot pain and wants to r/o cane as being the primary reason for her foot pain. reports since she has been ambulating indepdnently she is more sore to her hips and low back and reports it is more that she is using muscles she hasnt had to use for walking.  Statets she continues with her ball, core and leg exercises.   Pain Assessment Currently in Pain?: Yes Pain Score: 5  Pain Location: Back  Precautions/Restrictions     Exercise/Treatments  Manual Therapy Manual Therapy: Myofascial release Myofascial Release: Supine: BLE: thighs and hip flexors, prone to lumbar and gluteal region to decrease fascial restrictions   Physical Therapy Assessment and Plan PT Assessment and Plan Clinical Impression Statement: ambulated indepednently today with moderate gait deviations.  decreased fascial restrictions after manual techniques to all areas listed and has improved tone to BLE and gluteal region.  PT Plan: Continue with internal pelvic floor therapy as needed.     Goals    Problem List Patient Active Problem List   Diagnosis Date Noted  . Cervical pain 08/25/2012  . Pain in joint, pelvic region and thigh 07/16/2012  . 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 Balance disorder 07/16/2012  . Personal history of fall 07/16/2012  . 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 H N P-LUMBAR 04/23/2010  . BACK PAIN 01/28/2010    PT -  End of Session Activity Tolerance: Patient tolerated treatment well PT Plan of Care PT Patient Instructions: discussed pelvic pain support group, TENS unit, pt used guided breathing by [name] and provided her with website.  Consulted and Agree with Plan of Care: Patient  GP    Melissa Pruitt 12/30/2012, 10:24 AM

## 2013-01-03 ENCOUNTER — Ambulatory Visit (HOSPITAL_COMMUNITY)
Admission: RE | Admit: 2013-01-03 | Discharge: 2013-01-03 | Disposition: A | Discharge status: Home / Self Care | Payer: Medicaid Other | Source: Ambulatory Visit | Attending: Internal Medicine | Admitting: Internal Medicine

## 2013-01-03 NOTE — Progress Notes (Signed)
Physical Therapy Treatment Patient Details  Name: Melissa Pruitt MRN: [id number] Date of Birth: 1976/03/03  Today's Date: 01/03/2013 Time: 4098-1191 PT Time Calculation (min): 49 min Charges: Manual: 20' Visit#: 40 of 42  Re-eval: 01/11/13   Subjective: Symptoms/Limitations Symptoms: Pt states she is using her SPC again due to significant pain without it.  She is having increased pain to her Rt pudenal region.  Pain Assessment Currently in Pain?: Yes Pain Score: 8  Pain Location: Pelvis Pain Orientation: Right  Precautions/Restrictions     Exercise/Treatments Manual Therapy Myofascial Release: Supine: RLE: thighs and hip flexors, pudenal region and internal pelvic floor Layer 3 SCS for OI release.   Physical Therapy Assessment and Plan PT Assessment and Plan Clinical Impression Statement: Pt had a 100% decrease in muscle spasms to pelvic floor and 50% to her Rt hip. PT Plan: Continue with internal pelvic floor therapy as needed.     Goals Home Exercise Program Pt/caregiver will Perform Home Exercise Program: Independently PT Goal: Perform Home Exercise Program - Progress: Met PT Short Term Goals Time to Complete Short Term Goals: 4 weeks PT Short Term Goal 1: Pt will report pelvic pain less than 5/10 for 50% of her day.  PT Short Term Goal 1 - Progress: Progressing toward goal PT Short Term Goal 2: Pt will present with decreased fascial restrictions to external and internal pelvic floor region for improved QOL.   PT Short Term Goal 2 - Progress: Progressing toward goal PT Long Term Goals Time to Complete Long Term Goals: 8 weeks PT Long Term Goal 1: Pt will be independent with self care and neuro re-ed for her PFM.  PT Long Term Goal 1 - Progress: Met PT Long Term Goal 2: Pt will present with minimal fascial restrictions to her PFM in order to report decreased pain with intercourse.  PT Long Term Goal 2 - Progress: Progressing toward goal  Problem List Patient  Active Problem List   Diagnosis Date Noted  . Cervical pain 08/25/2012  . Pain in joint, pelvic region and thigh 07/16/2012  . 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 Balance disorder 07/16/2012  . Personal history of fall 07/16/2012  . 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 H N P-LUMBAR 04/23/2010  . BACK PAIN 01/28/2010    PT - End of Session Activity Tolerance: Patient tolerated treatment well PT Plan of Care PT Patient Instructions: discussed pelvic pain support group, TENS unit, pt used guided breathing by [name] and provided her with website.  Consulted and Agree with Plan of Care: Patient  GP    Bryse Blanchette 01/03/2013, 9:32 AM

## 2013-01-07 ENCOUNTER — Ambulatory Visit (HOSPITAL_COMMUNITY)
Admission: RE | Admit: 2013-01-07 | Discharge: 2013-01-07 | Disposition: A | Discharge status: Home / Self Care | Payer: Medicaid Other | Source: Ambulatory Visit | Attending: Internal Medicine | Admitting: Internal Medicine

## 2013-01-07 NOTE — Progress Notes (Signed)
Physical Therapy Discharge Summary Patient Details  Name: Melissa Pruitt MRN: [id number] Date of Birth: 07/02/1975  Today's Date: 01/07/2013 Time: 1110-1145 PT Time Calculation (min): 35 min Charges: Manual: 35 Visit#: 41 of    Re-eval: 01/11/13   Subjective: Symptoms/Limitations Symptoms: Pt reports that she feels much better after last session and is now using her SPC.  She is ready for D/C from PT due t lack of coverage and plans to hopefully continue with therapy in January.  Discussed with pt improtance to find a massage therapist at this time.  She reports she would like her medical records sent to her.  She has filled out appropriate paper work.  Pain Assessment Currently in Pain?: Yes Pain Score: 7  Pain Location: Pelvis Pain Orientation: Right Pain Type: Chronic pain  Precautions/Restrictions     Exercise/Treatments  Manual Therapy Manual Therapy: Myofascial release Myofascial Release: Supine: RLE: thighs and hip flexors, pudenal region and internal pelvic floor Layer 3 SCS for OI release  Physical Therapy Assessment and Plan PT Assessment and Plan Clinical Impression Statement: Melissa Pruitt has had 41 OP PT visits to address pelvic pain and low back pain with the following findings: she has a chronic pelvic pain and low back pain due to tethered spinal cord. Pt always reports improvement after manual therapy and continues to be diligent on her HEP including core strengthening, yoga and pilates.  At this time recommend pt find a massage therapist to help her manage her low back and hip pain and if she is able to return to OP PT to focus on improving her pelvic pain.  At this time will d/c from PT due to coverage limiations.  PT Plan: D/C    Goals Home Exercise Program Pt/caregiver will Perform Home Exercise Program: Independently PT Goal: Perform Home Exercise Program - Progress: Met PT Short Term Goals Time to Complete Short Term Goals: 4 weeks PT Short Term Goal 1:  Pt will report pelvic pain less than 5/10 for 50% of her day.  PT Short Term Goal 1 - Progress: Progressing toward goal PT Short Term Goal 2: Pt will present with decreased fascial restrictions to external and internal pelvic floor region for improved QOL.   PT Short Term Goal 2 - Progress: Progressing toward goal PT Long Term Goals Time to Complete Long Term Goals: 8 weeks PT Long Term Goal 1: Pt will be independent with self care and neuro re-ed for her PFM.  PT Long Term Goal 1 - Progress: Met PT Long Term Goal 2: Pt will present with minimal fascial restrictions to her PFM in order to report decreased pain with intercourse.  PT Long Term Goal 2 - Progress: Progressing toward goal  Problem List Patient Active Problem List   Diagnosis Date Noted  . Cervical pain 08/25/2012  . Pain in joint, pelvic region and thigh 07/16/2012  . 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 Balance disorder 07/16/2012  . Personal history of fall 07/16/2012  . 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 H N P-LUMBAR 04/23/2010  . BACK PAIN 01/28/2010    PT - End of Session Activity Tolerance: Patient tolerated treatment well PT Plan of Care PT Patient Instructions: finding a massage therapist and d/c Consulted and Agree with Plan of Care: Patient  GP    Melissa Pruitt, MPT, ATC 01/07/2013, 12:12 PM

## 2013-01-21 NOTE — Addendum Note (Signed)
Encounter addended by: Matilde Haymaker, PT on: 01/21/2013  2:18 PM<BR>     Documentation filed: Clinical Notes

## 2013-04-01 ENCOUNTER — Emergency Department: Payer: Self-pay | Admitting: Emergency Medicine

## 2013-04-01 LAB — URINALYSIS, COMPLETE
Bacteria: NONE SEEN
Blood: NEGATIVE
Nitrite: NEGATIVE
Ph: 6 (ref 4.5–8.0)
Protein: NEGATIVE
RBC,UR: NONE SEEN /HPF (ref 0–5)
Squamous Epithelial: 1
WBC UR: <1 /HPF (ref 0–5)

## 2013-04-01 LAB — COMPREHENSIVE METABOLIC PANEL
Alkaline Phosphatase: 31 U/L — ABNORMAL LOW
Bilirubin,Total: 0.4 mg/dL (ref 0.2–1.0)
Calcium, Total: 8.8 mg/dL (ref 8.5–10.1)
Co2: 28 mmol/L (ref 21–32)
EGFR (Non-African Amer.): >60
Osmolality: 271 (ref 275–301)
Sodium: 136 mmol/L (ref 136–145)
Total Protein: 6.7 g/dL (ref 6.4–8.2)

## 2013-04-01 LAB — CBC
HGB: 12.5 g/dL (ref 12.0–16.0)
MCH: 29 pg (ref 26.0–34.0)
MCHC: 32.6 g/dL (ref 32.0–36.0)
MCV: 89 fL (ref 80–100)
RDW: 13.1 % (ref 11.5–14.5)

## 2013-04-03 LAB — URINE CULTURE

## 2013-05-09 DIAGNOSIS — N879 Dysplasia of cervix uteri, unspecified: Secondary | ICD-10-CM | POA: Insufficient documentation

## 2013-05-09 DIAGNOSIS — M26629 Arthralgia of temporomandibular joint, unspecified side: Secondary | ICD-10-CM | POA: Insufficient documentation

## 2013-05-09 DIAGNOSIS — G4489 Other headache syndrome: Secondary | ICD-10-CM | POA: Insufficient documentation

## 2013-06-17 ENCOUNTER — Other Ambulatory Visit: Payer: Self-pay | Admitting: Obstetrics and Gynecology

## 2013-07-21 ENCOUNTER — Other Ambulatory Visit: Payer: Self-pay | Admitting: Adult Health

## 2013-08-09 DIAGNOSIS — G8929 Other chronic pain: Secondary | ICD-10-CM | POA: Insufficient documentation

## 2013-09-02 ENCOUNTER — Ambulatory Visit: Payer: Self-pay

## 2013-11-14 ENCOUNTER — Ambulatory Visit: Payer: Self-pay | Admitting: Nurse Practitioner

## 2013-11-24 ENCOUNTER — Emergency Department: Payer: Self-pay | Admitting: Emergency Medicine

## 2013-11-24 LAB — URINALYSIS, COMPLETE
BLOOD: NEGATIVE
Bacteria: NONE SEEN
Bilirubin,UR: NEGATIVE
GLUCOSE, UR: NEGATIVE mg/dL (ref 0–75)
Ketone: NEGATIVE
Nitrite: NEGATIVE
Ph: 7 (ref 4.5–8.0)
Protein: NEGATIVE
RBC,UR: <1 /HPF (ref 0–5)
Specific Gravity: 1.003 (ref 1.003–1.030)

## 2013-11-26 LAB — URINE CULTURE

## 2014-01-09 DIAGNOSIS — A630 Anogenital (venereal) warts: Secondary | ICD-10-CM | POA: Insufficient documentation

## 2014-01-19 DIAGNOSIS — J309 Allergic rhinitis, unspecified: Secondary | ICD-10-CM | POA: Insufficient documentation

## 2014-01-20 ENCOUNTER — Ambulatory Visit (INDEPENDENT_AMBULATORY_CARE_PROVIDER_SITE_OTHER): Payer: Medicaid Other

## 2014-01-20 ENCOUNTER — Ambulatory Visit (INDEPENDENT_AMBULATORY_CARE_PROVIDER_SITE_OTHER): Payer: Medicaid Other | Admitting: Podiatry

## 2014-01-20 ENCOUNTER — Encounter: Payer: Self-pay | Admitting: Podiatry

## 2014-01-20 VITALS — BP 100/64 | HR 71 | Resp 16 | Ht 64.0 in | Wt 105.0 lb

## 2014-01-20 DIAGNOSIS — M779 Enthesopathy, unspecified: Secondary | ICD-10-CM

## 2014-01-20 DIAGNOSIS — M2041 Other hammer toe(s) (acquired), right foot: Secondary | ICD-10-CM

## 2014-01-20 DIAGNOSIS — M2040 Other hammer toe(s) (acquired), unspecified foot: Secondary | ICD-10-CM

## 2014-01-20 DIAGNOSIS — B351 Tinea unguium: Secondary | ICD-10-CM

## 2014-01-20 DIAGNOSIS — M7750 Other enthesopathy of unspecified foot: Secondary | ICD-10-CM

## 2014-01-20 MED ORDER — TRIAMCINOLONE ACETONIDE 10 MG/ML IJ SUSP
10.0000 mg | Freq: Once | INTRAMUSCULAR | Status: AC
  Administered 2014-01-20: 10 mg

## 2014-01-20 NOTE — Progress Notes (Signed)
   Subjective:    Patient ID: Melissa Pruitt, female    DOB: 02/13/1976, 38 y.o.   MRN: [id number]  HPI Comments: My right foot hurts all over. This has been going off and on for a while. The foot is getting worse. It hurts horribly. i seen a podiatrist in [place] a couple of years ago and they said i broke my middle toe into my foot. It hurts to walk and stand. i rolled my foot the first of September. i elevate and put ice on it. i had x-rays taken at [facility]. It showed hammer toes, degenerative changes and arthritis.   Foot Pain Associated symptoms include fatigue and numbness.      Review of Systems  Constitutional: Positive for fatigue.  HENT: Positive for sinus pressure and sneezing.        Ringing in ears Ear pain  Respiratory: Positive for shortness of breath.   Cardiovascular:       Calf pain  Gastrointestinal: Positive for diarrhea and constipation.  Endocrine: Positive for cold intolerance and heat intolerance.  Musculoskeletal: Positive for back pain.       Joint pain Difficulty walking Muscle pain  Allergic/Immunologic: Positive for environmental allergies and food allergies.  Neurological: Positive for numbness.  Hematological: Bruises/bleeds easily.  All other systems reviewed and are negative.      Objective:   Physical Exam        Assessment & Plan:

## 2014-01-20 NOTE — Progress Notes (Signed)
Subjective:     Patient ID: Melissa Pruitt, female   DOB: [birth year], 38 y.o.   MRN: [id number]  Foot Pain   patient presents stating my foot hurts right and I've had a history of significant back issues which has lead to loss of feeling in the foot loss of strength and muscle groups on her right side with discomfort around the posterior tibial insertion and in the forefoot  Review of Systems  All other systems reviewed and are negative.      Objective:   Physical Exam  Nursing note and vitals reviewed. Constitutional: She is oriented to person, place, and time.  Cardiovascular: Intact distal pulses.   Musculoskeletal: Normal range of motion.  Neurological: She is oriented to person, place, and time.  Skin: Skin is warm.   vascular status found to be intact with muscle strength adequate and range of motion subtalar and midtarsal joint within normal limits. Patient is noted to have good digital perfusion and does have diminishment of sharp dull and vibratory and also has diminished muscle strength in the forefoot right.   patient does have discomfort mostly at the posterior tibial insertion right and also across the right arch and lateral foot elevation of the toes are noted Assessment:     Significant structural issues right foot secondary to back issues creating abnormal gait pattern and leading to the issue she is experiencing    Plan:     H&P and x-ray reviewed and today I injected the posterior tibial insertion 3 mg Kenalog 5 mg Xylocaine dispensed fascially brace and gave instructions if symptoms persist we will have to consider some type of a lower leg brace for this patient. Reappoint if symptoms persist

## 2014-02-09 ENCOUNTER — Emergency Department: Payer: Self-pay | Admitting: Emergency Medicine

## 2014-02-09 LAB — COMPREHENSIVE METABOLIC PANEL
ANION GAP: 10 (ref 7–16)
AST: 9 U/L — AB (ref 15–37)
Albumin: 3.4 g/dL (ref 3.4–5.0)
Alkaline Phosphatase: 30 U/L — ABNORMAL LOW
BILIRUBIN TOTAL: 0.3 mg/dL (ref 0.2–1.0)
BUN: 8 mg/dL (ref 7–18)
CHLORIDE: 105 mmol/L (ref 98–107)
CO2: 27 mmol/L (ref 21–32)
Calcium, Total: 8.3 mg/dL — ABNORMAL LOW (ref 8.5–10.1)
Creatinine: 0.62 mg/dL (ref 0.60–1.30)
GLUCOSE: 130 mg/dL — AB (ref 65–99)
Osmolality: 283 (ref 275–301)
POTASSIUM: 3.5 mmol/L (ref 3.5–5.1)
SGPT (ALT): 15 U/L
Sodium: 142 mmol/L (ref 136–145)
TOTAL PROTEIN: 6.6 g/dL (ref 6.4–8.2)

## 2014-02-09 LAB — URINALYSIS, COMPLETE
BILIRUBIN, UR: NEGATIVE
Bacteria: NONE SEEN
Blood: NEGATIVE
GLUCOSE, UR: NEGATIVE mg/dL (ref 0–75)
Ketone: NEGATIVE
Leukocyte Esterase: NEGATIVE
Nitrite: NEGATIVE
PH: 7 (ref 4.5–8.0)
PROTEIN: NEGATIVE
SPECIFIC GRAVITY: 1.006 (ref 1.003–1.030)

## 2014-02-09 LAB — CBC WITH DIFFERENTIAL/PLATELET
BASOS ABS: 0 10*3/uL (ref 0.0–0.1)
BASOS PCT: 0.4 %
Eosinophil #: 0 10*3/uL (ref 0.0–0.7)
Eosinophil %: 0.6 %
HCT: 37.4 % (ref 35.0–47.0)
HGB: 12.3 g/dL (ref 12.0–16.0)
LYMPHS ABS: 2 10*3/uL (ref 1.0–3.6)
Lymphocyte %: 26.9 %
MCH: 30.7 pg (ref 26.0–34.0)
MCHC: 33 g/dL (ref 32.0–36.0)
MCV: 93 fL (ref 80–100)
MONO ABS: 0.3 x10 3/mm (ref 0.2–0.9)
Monocyte %: 3.8 %
NEUTROS ABS: 5.1 10*3/uL (ref 1.4–6.5)
NEUTROS PCT: 68.3 %
Platelet: 238 10*3/uL (ref 150–440)
RBC: 4.02 10*6/uL (ref 3.80–5.20)
RDW: 13.3 % (ref 11.5–14.5)
WBC: 7.5 10*3/uL (ref 3.6–11.0)

## 2014-02-09 LAB — PREGNANCY, URINE: PREGNANCY TEST, URINE: NEGATIVE m[IU]/mL

## 2014-02-09 LAB — LIPASE, BLOOD: Lipase: 181 U/L (ref 73–393)

## 2014-02-24 ENCOUNTER — Ambulatory Visit: Payer: Medicaid Other | Admitting: Podiatry

## 2014-03-03 ENCOUNTER — Encounter: Payer: Self-pay | Admitting: Podiatry

## 2014-03-03 ENCOUNTER — Ambulatory Visit (INDEPENDENT_AMBULATORY_CARE_PROVIDER_SITE_OTHER): Payer: Medicaid Other | Admitting: Podiatry

## 2014-03-03 DIAGNOSIS — M7751 Other enthesopathy of right foot: Secondary | ICD-10-CM | POA: Diagnosis not present

## 2014-03-03 DIAGNOSIS — M779 Enthesopathy, unspecified: Secondary | ICD-10-CM

## 2014-03-03 MED ORDER — TRIAMCINOLONE ACETONIDE 10 MG/ML IJ SUSP
10.0000 mg | Freq: Once | INTRAMUSCULAR | Status: AC
  Administered 2014-03-03: 10 mg

## 2014-03-04 NOTE — Progress Notes (Signed)
Subjective:     Patient ID: Melissa Pruitt, female   DOB: [birth year], 38 y.o.   MRN: [id number]  HPI patient presents stating that my right tendon is doing better still tender but I'm getting a lot of pain in my forefoot and I still have terrible back problems   Review of Systems     Objective:   Physical Exam Vascular status intact with moderate discomfort around the right fourth MPJ and around the posterior tibial tendon improvement is noted with mild discomfort    Assessment:     Tendinitis capsulitis situation    Plan:     Very careful periarticular injection around the fourth MPJ with 2 mg dexamethasone Kenalog 5 mg Xylocaine and then applied thick plantar pad to reduce stress against the joint surface and also discussed well supportive-type shoe gear

## 2014-04-27 ENCOUNTER — Ambulatory Visit: Payer: Self-pay | Admitting: Internal Medicine

## 2014-04-27 LAB — IRON AND TIBC
IRON BIND. CAP.(TOTAL): 408 ug/dL (ref 250–450)
IRON: 145 ug/dL (ref 50–170)
Iron Saturation: 36 %
Unbound Iron-Bind.Cap.: 263 ug/dL

## 2014-04-27 LAB — FOLATE: FOLIC ACID: 17.3 ng/mL (ref 3.1–17.5)

## 2014-04-27 LAB — CBC CANCER CENTER
BASOS ABS: 1 %
Bands: 2 %
Comment - H1-Com4: NORMAL
Comment - H1-Com5: NORMAL
HCT: 38.8 % (ref 35.0–47.0)
HGB: 12.7 g/dL (ref 12.0–16.0)
Lymphocytes: 36 %
MCH: 30.8 pg (ref 26.0–34.0)
MCHC: 32.8 g/dL (ref 32.0–36.0)
MCV: 94 fL (ref 80–100)
MONOS PCT: 3 %
Platelet: 255 x10 3/mm (ref 150–440)
RBC: 4.13 10*6/uL (ref 3.80–5.20)
RDW: 13.3 % (ref 11.5–14.5)
Segmented Neutrophils: 58 %
WBC: 8.4 x10 3/mm (ref 3.6–11.0)

## 2014-04-27 LAB — FERRITIN: Ferritin (ARMC): 38 ng/mL (ref 8–388)

## 2014-05-01 ENCOUNTER — Emergency Department: Payer: Self-pay | Admitting: Emergency Medicine

## 2014-05-15 ENCOUNTER — Ambulatory Visit: Payer: Self-pay | Admitting: Internal Medicine

## 2014-06-13 ENCOUNTER — Ambulatory Visit
Admit: 2014-06-13 | Disposition: A | Discharge status: Home / Self Care | Payer: Self-pay | Attending: Internal Medicine | Admitting: Internal Medicine

## 2014-07-07 ENCOUNTER — Emergency Department: Payer: Self-pay | Admitting: Student

## 2014-07-14 ENCOUNTER — Ambulatory Visit
Admit: 2014-07-14 | Disposition: A | Discharge status: Home / Self Care | Payer: Self-pay | Attending: Internal Medicine | Admitting: Internal Medicine

## 2014-08-21 ENCOUNTER — Other Ambulatory Visit: Payer: Self-pay | Admitting: *Deleted

## 2014-08-21 ENCOUNTER — Encounter: Payer: Self-pay | Admitting: *Deleted

## 2014-08-21 DIAGNOSIS — D649 Anemia, unspecified: Secondary | ICD-10-CM | POA: Insufficient documentation

## 2014-08-21 DIAGNOSIS — E538 Deficiency of other specified B group vitamins: Secondary | ICD-10-CM

## 2014-08-21 HISTORY — DX: Anemia, unspecified: D64.9

## 2014-08-21 HISTORY — DX: Deficiency of other specified B group vitamins: E53.8

## 2014-08-24 ENCOUNTER — Inpatient Hospital Stay: Payer: Medicaid Other | Attending: Internal Medicine

## 2014-08-24 VITALS — BP 97/60 | HR 79 | Temp 97.8°F | Resp 20

## 2014-08-24 DIAGNOSIS — E538 Deficiency of other specified B group vitamins: Secondary | ICD-10-CM

## 2014-08-24 DIAGNOSIS — D649 Anemia, unspecified: Secondary | ICD-10-CM | POA: Diagnosis not present

## 2014-08-24 MED ORDER — CYANOCOBALAMIN 1000 MCG/ML IJ SOLN
1000.0000 ug | Freq: Once | INTRAMUSCULAR | Status: AC
  Administered 2014-08-24: 1000 ug via INTRAMUSCULAR
  Filled 2014-08-24: qty 1

## 2014-09-21 ENCOUNTER — Inpatient Hospital Stay: Payer: Medicaid Other | Attending: Internal Medicine

## 2014-09-21 VITALS — BP 96/48 | HR 73 | Temp 96.8°F | Resp 19

## 2014-09-21 DIAGNOSIS — E538 Deficiency of other specified B group vitamins: Secondary | ICD-10-CM

## 2014-09-21 DIAGNOSIS — Z79899 Other long term (current) drug therapy: Secondary | ICD-10-CM | POA: Insufficient documentation

## 2014-09-21 MED ORDER — CYANOCOBALAMIN 1000 MCG/ML IJ SOLN
1000.0000 ug | Freq: Once | INTRAMUSCULAR | Status: AC
  Administered 2014-09-21: 1000 ug via INTRAMUSCULAR
  Filled 2014-09-21: qty 1

## 2014-10-19 ENCOUNTER — Inpatient Hospital Stay: Payer: Medicaid Other | Attending: Internal Medicine

## 2014-10-19 VITALS — BP 95/55 | HR 70 | Temp 97.0°F | Resp 18

## 2014-10-19 DIAGNOSIS — Z79899 Other long term (current) drug therapy: Secondary | ICD-10-CM | POA: Diagnosis not present

## 2014-10-19 DIAGNOSIS — E538 Deficiency of other specified B group vitamins: Secondary | ICD-10-CM

## 2014-10-19 MED ORDER — CYANOCOBALAMIN 1000 MCG/ML IJ SOLN
1000.0000 ug | Freq: Once | INTRAMUSCULAR | Status: AC
  Administered 2014-10-19: 1000 ug via INTRAMUSCULAR

## 2014-11-08 ENCOUNTER — Emergency Department
Admission: EM | Admit: 2014-11-08 | Discharge: 2014-11-08 | Disposition: A | Discharge status: Home / Self Care | Payer: Medicaid Other | Attending: Emergency Medicine | Admitting: Emergency Medicine

## 2014-11-08 DIAGNOSIS — Z87891 Personal history of nicotine dependence: Secondary | ICD-10-CM | POA: Diagnosis not present

## 2014-11-08 DIAGNOSIS — R1031 Right lower quadrant pain: Secondary | ICD-10-CM | POA: Diagnosis present

## 2014-11-08 DIAGNOSIS — N72 Inflammatory disease of cervix uteri: Secondary | ICD-10-CM

## 2014-11-08 DIAGNOSIS — N3090 Cystitis, unspecified without hematuria: Secondary | ICD-10-CM | POA: Insufficient documentation

## 2014-11-08 DIAGNOSIS — N390 Urinary tract infection, site not specified: Secondary | ICD-10-CM

## 2014-11-08 LAB — WET PREP, GENITAL
Clue Cells Wet Prep HPF POC: NONE SEEN
TRICH WET PREP: NONE SEEN
YEAST WET PREP: NONE SEEN

## 2014-11-08 LAB — URINALYSIS COMPLETE WITH MICROSCOPIC (ARMC ONLY)
BILIRUBIN URINE: NEGATIVE
Glucose, UA: NEGATIVE mg/dL
Hgb urine dipstick: NEGATIVE
Ketones, ur: NEGATIVE mg/dL
NITRITE: NEGATIVE
Protein, ur: NEGATIVE mg/dL
Specific Gravity, Urine: 1.011 (ref 1.005–1.030)
pH: 6 (ref 5.0–8.0)

## 2014-11-08 MED ORDER — OXYCODONE-ACETAMINOPHEN 5-325 MG PO TABS
ORAL_TABLET | ORAL | Status: AC
  Filled 2014-11-08: qty 2

## 2014-11-08 MED ORDER — ONDANSETRON HCL 4 MG PO TABS: 4.0000 mg | ORAL_TABLET | Freq: Once | ORAL | Status: AC

## 2014-11-08 MED ORDER — ONDANSETRON 4 MG PO TBDP
ORAL_TABLET | ORAL | Status: AC
  Administered 2014-11-08: 4 mg
  Filled 2014-11-08: qty 1

## 2014-11-08 MED ORDER — CEFTRIAXONE SODIUM 250 MG IJ SOLR
250.0000 mg | INTRAMUSCULAR | Status: DC
  Administered 2014-11-08: 250 mg via INTRAMUSCULAR
  Filled 2014-11-08: qty 250

## 2014-11-08 MED ORDER — LIDOCAINE HCL (PF) 1 % IJ SOLN
INTRAMUSCULAR | Status: AC
  Administered 2014-11-08: 0.9 mL
  Filled 2014-11-08: qty 5

## 2014-11-08 MED ORDER — OXYCODONE-ACETAMINOPHEN 5-325 MG PO TABS
2.0000 | ORAL_TABLET | Freq: Once | ORAL | Status: AC
  Administered 2014-11-08: 2 via ORAL

## 2014-11-08 MED ORDER — LIDOCAINE HCL (PF) 1 % IJ SOLN
0.9000 mL | Freq: Once | INTRAMUSCULAR | Status: AC
  Administered 2014-11-08: 0.9 mL

## 2014-11-08 MED ORDER — DOXYCYCLINE HYCLATE 100 MG PO CAPS: 100.0000 mg | ORAL_CAPSULE | Freq: Two times a day (BID) | ORAL | Status: DC

## 2014-11-08 MED ORDER — ONDANSETRON HCL 4 MG PO TABS: 4.0000 mg | ORAL_TABLET | Freq: Every day | ORAL | Status: DC | PRN

## 2014-11-08 MED ORDER — OXYCODONE-ACETAMINOPHEN 5-325 MG PO TABS: 1.0000 | ORAL_TABLET | Freq: Four times a day (QID) | ORAL | Status: DC | PRN

## 2014-11-08 MED ORDER — METRONIDAZOLE 500 MG PO TABS: 500.0000 mg | ORAL_TABLET | Freq: Two times a day (BID) | ORAL | Status: DC

## 2014-11-08 NOTE — Discharge Instructions (Signed)
Cervicitis Cervicitis is a soreness and swelling (inflammation) of the cervix. Your cervix is located at the bottom of your uterus. It opens up to the vagina. CAUSES   Sexually transmitted infections (STIs).   Allergic reaction.   Medicines or birth control devices that are put in the vagina.   Injury to the cervix.   Bacterial infections.  RISK FACTORS You are at greater risk if you:  Have unprotected sexual intercourse.  Have sexual intercourse with many partners.  Began sexual intercourse at an early age.  Have a history of STIs. SYMPTOMS  There may be no symptoms. If symptoms occur, they may include:   Gray, white, yellow, or bad-smelling vaginal discharge.   Pain or itching of the area outside the vagina.   Painful sexual intercourse.   Lower abdominal or lower back pain, especially during intercourse.   Frequent urination.   Abnormal vaginal bleeding between periods, after sexual intercourse, or after menopause.   Pressure or a heavy feeling in the pelvis.  DIAGNOSIS  Diagnosis is made after a pelvic exam. Other tests may include:   Examination of any discharge under a microscope (wet prep).   A Pap test.  TREATMENT  Treatment will depend on the cause of cervicitis. If it is caused by an STI, both you and your partner will need to be treated. Antibiotic medicines will be given.  HOME CARE INSTRUCTIONS   Do not have sexual intercourse until your health care provider says it is okay.   Do not have sexual intercourse until your partner has been treated, if your cervicitis is caused by an STI.   Take your antibiotics as directed. Finish them even if you start to feel better.  SEEK MEDICAL CARE IF:  Your symptoms come back.   You have a fever.  MAKE SURE YOU:   Understand these instructions.  Will watch your condition.  Will get help right away if you are not doing well or get worse. Document Released: 03/31/2005 Document Revised:  04/05/2013 Document Reviewed: 09/22/2012 [facility] Patient Information 2015 [place], [place]. This information is not intended to replace advice given to you by your health care provider. Make sure you discuss any questions you have with your health care provider.  Urinary Tract Infection Urinary tract infections (UTIs) can develop anywhere along your urinary tract. Your urinary tract is your body's drainage system for removing wastes and extra water. Your urinary tract includes two kidneys, two ureters, a bladder, and a urethra. Your kidneys are a pair of bean-shaped organs. Each kidney is about the size of your fist. They are located below your ribs, one on each side of your spine. CAUSES Infections are caused by microbes, which are microscopic organisms, including fungi, viruses, and bacteria. These organisms are so small that they can only be seen through a microscope. Bacteria are the microbes that most commonly cause UTIs. SYMPTOMS  Symptoms of UTIs may vary by age and gender of the patient and by the location of the infection. Symptoms in young women typically include a frequent and intense urge to urinate and a painful, burning feeling in the bladder or urethra during urination. Older women and men are more likely to be tired, shaky, and weak and have muscle aches and abdominal pain. A fever may mean the infection is in your kidneys. Other symptoms of a kidney infection include pain in your back or sides below the ribs, nausea, and vomiting. DIAGNOSIS To diagnose a UTI, your caregiver will ask you about your  symptoms. Your caregiver also will ask to provide a urine sample. The urine sample will be tested for bacteria and white blood cells. White blood cells are made by your body to help fight infection. TREATMENT  Typically, UTIs can be treated with medication. Because most UTIs are caused by a bacterial infection, they usually can be treated with the use of antibiotics. The choice of antibiotic  and length of treatment depend on your symptoms and the type of bacteria causing your infection. HOME CARE INSTRUCTIONS  If you were prescribed antibiotics, take them exactly as your caregiver instructs you. Finish the medication even if you feel better after you have only taken some of the medication.  Drink enough water and fluids to keep your urine clear or pale yellow.  Avoid caffeine, tea, and carbonated beverages. They tend to irritate your bladder.  Empty your bladder often. Avoid holding urine for long periods of time.  Empty your bladder before and after sexual intercourse.  After a bowel movement, women should cleanse from front to back. Use each tissue only once. SEEK MEDICAL CARE IF:   You have back pain.  You develop a fever.  Your symptoms do not begin to resolve within 3 days. SEEK IMMEDIATE MEDICAL CARE IF:   You have severe back pain or lower abdominal pain.  You develop chills.  You have nausea or vomiting.  You have continued burning or discomfort with urination. MAKE SURE YOU:   Understand these instructions.  Will watch your condition.  Will get help right away if you are not doing well or get worse. Document Released: 01/08/2005 Document Revised: 09/30/2011 Document Reviewed: 05/09/2011 [facility] Patient Information 2015 [place], [place]. This information is not intended to replace advice given to you by your health care provider. Make sure you discuss any questions you have with your health care provider.

## 2014-11-08 NOTE — ED Notes (Signed)
Pt here with c/o urinary burning and frequent UTI.  Pt just completed antibiotics for same.  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

## 2014-11-08 NOTE — ED Provider Notes (Signed)
[facility] Emergency Department Provider Note     Time seen: ----------------------------------------- 6:08 PM on 11/08/2014 -----------------------------------------    I have reviewed the triage vital signs and the nursing notes.   HISTORY  Chief Complaint Urinary Tract Infection    HPI Melissa Pruitt is a 39 y.o. female who presents ER with urinary burning and frequent UTI. Also complains of right flank and low back pain. Patient states she just completed antibiotics a little over week ago for the same thing. 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 Denies fevers or chills, is nauseous.   Past Medical History  Diagnosis Date  . 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sacro/coccygeal mass excision  . Cervical cancer     removed in July 2009  . Normocytic anemia 08/21/2014  . Vitamin B 12 deficiency 08/21/2014    Patient Active Problem List   Diagnosis Date Noted  . Normocytic anemia 08/21/2014  . Vitamin B 12 deficiency 08/21/2014  . Cervical pain 08/25/2012  . Pain in joint, pelvic region and thigh 07/16/2012  . 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 Balance disorder 07/16/2012  . Personal history of fall 07/16/2012  . 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 [place] SP CORD&NERV SYSTEM 05/08/2010  . H N P-LUMBAR 04/23/2010  . BACK PAIN 01/28/2010    Past Surgical History  Procedure Laterality Date  . Back surgery    . Bladder surgery    . Colostomy      after flex sig as 39 year old.   . Tubal ligation    . Removal of cervical cancer    . Colostomy reversal    . Exploratory laparotomy      with colostomy placement    Allergies Ciprofloxacin; Azithromycin; Dilaudid; Sulfa antibiotics; and Tramadol hcl  Social History History  Substance Use Topics  . Smoking status: Former Smoker     Types: Cigarettes  . Smokeless tobacco: Not on file     Comment: 1-3 cigarettes  . Alcohol Use: Yes     Comment: a beer here and there    Review of Systems Constitutional: Negative for fever. Eyes: Negative for visual changes. ENT: Negative for sore throat. Cardiovascular: Negative for chest pain. Respiratory: Negative for shortness of breath. Gastrointestinal: Positive for right lower abdominal pain Genitourinary: Positive for urinary burning Musculoskeletal positive for right-sided low back pain Skin: Negative for rash. Neurological: Negative for headaches, focal weakness or numbness.  10-point ROS otherwise negative.  ____________________________________________   PHYSICAL EXAM:  VITAL SIGNS: ED Triage Vitals  Enc Vitals Group     BP 11/08/14 1738 106/71 mmHg     Pulse Rate 11/08/14 1738 75     Resp 11/08/14 1738 18     Temp 11/08/14 1738 98.5 F (36.9 C)     Temp Source 11/08/14 1738 Oral     SpO2 11/08/14 1738 100 %     Weight 11/08/14 1738 117 lb (53.071 kg)     Height 11/08/14 1738 5\' 4"  (1.626 m)     Head Cir --      Peak Flow --      Pain Score 11/08/14 1740 9     Pain Loc --      Pain Edu? --      Excl. in [place]? --     Constitutional: Alert and oriented. Well  appearing and in no distress. Eyes: Conjunctivae are normal. PERRL. Normal extraocular movements. ENT   Head: Normocephalic and atraumatic.   Nose: No congestion/rhinnorhea.   Mouth/Throat: Mucous membranes are moist.   Neck: No stridor. Cardiovascular: Normal rate, regular rhythm. Normal and symmetric distal pulses are present in all extremities. No murmurs, rubs, or gallops. Respiratory: Normal respiratory effort without tachypnea nor retractions. Breath sounds are clear and equal bilaterally. No wheezes/rales/rhonchi. Gastrointestinal: Mild right-sided abdominal tenderness, no rebound or guarding. Questionable right CVA tenderness Genitourinary: There is cervical motion  tenderness, vaginal discharge and erythema of the cervix. Musculoskeletal: Nontender with normal range of motion in all extremities. No joint effusions.  No lower extremity tenderness nor edema. Neurologic:  Normal speech and language. No gross focal neurologic deficits are appreciated. Speech is normal. No gait instability. Skin:  Skin is warm, dry and intact. No rash noted. Psychiatric: Mood and affect are normal. Speech and behavior are normal. Patient exhibits appropriate insight and judgment. ____________________________________________  ED COURSE:  Pertinent labs & imaging results that were available during my care of the patient were reviewed by me and considered in my medical decision making (see chart for details). We'll check urinalysis, give by mouth pain meds and Zofran. Patient also need pelvic examination ____________________________________________    LABS (pertinent positives/negatives)  Labs Reviewed  WET PREP, GENITAL - Abnormal; Notable for the following:    WBC, Wet Prep HPF POC MODERATE (*)    All other components within normal limits  URINALYSIS COMPLETEWITH MICROSCOPIC ([facility] ONLY) - Abnormal; Notable for the following:    Color, Urine YELLOW (*)    APPearance HAZY (*)    Leukocytes, UA 3+ (*)    Bacteria, UA RARE (*)    Squamous Epithelial / LPF 0-5 (*)    All other components within normal limits  CHLAMYDIA/NGC RT PCR ([facility] ONLY)  URINE CULTURE    ____________________________________________  FINAL ASSESSMENT AND PLAN  Cystitis, vaginal discharge  Plan: Patient with labs and imaging as dictated above. Patient was given Rocephin 250 mg IM. She'll be discharged with doxycycline and Flagyl. Stable for outpatient follow-up   Earleen Newport, MD   Earleen Newport, MD 11/08/14 2018

## 2014-11-08 NOTE — ED Notes (Signed)
Pt states she self caths d/t tethered spinal cord, pt states she is having pain in her right lower abdomen, back, nausea, and burning with catherization. Pt completed dose of Macrobid about 1.5 weeks ago for UTI prescribed by neuro-urologist.

## 2014-11-09 ENCOUNTER — Telehealth: Payer: Self-pay | Admitting: Emergency Medicine

## 2014-11-09 LAB — CHLAMYDIA/NGC RT PCR (ARMC ONLY)
CHLAMYDIA TR: NOT DETECTED
N GONORRHOEAE: NOT DETECTED

## 2014-11-10 LAB — URINE CULTURE: Culture: >=100000

## 2014-11-11 NOTE — Progress Notes (Signed)
ED Culture Report follow-up:    Patient was discharged 7/27 with Doxycycline and Metronidazole for UTI.  Cultures revealed E.Coli sensitive to Cefazolin and Ceftriaxone.  Called Brandi in ED with recommendation for Keflex 500mg  PO Q12H for 7-14 days.  She will contact MD for medication changes and then contact the patient with further instructions.

## 2014-11-16 ENCOUNTER — Inpatient Hospital Stay: Payer: Medicaid Other | Attending: Internal Medicine

## 2014-11-16 DIAGNOSIS — Z79899 Other long term (current) drug therapy: Secondary | ICD-10-CM | POA: Insufficient documentation

## 2014-11-16 DIAGNOSIS — E538 Deficiency of other specified B group vitamins: Secondary | ICD-10-CM | POA: Insufficient documentation

## 2014-11-16 MED ORDER — CYANOCOBALAMIN 1000 MCG/ML IJ SOLN
1000.0000 ug | Freq: Once | INTRAMUSCULAR | Status: AC
  Administered 2014-11-16: 1000 ug via INTRAMUSCULAR
  Filled 2014-11-16: qty 1

## 2014-12-14 ENCOUNTER — Inpatient Hospital Stay: Payer: Medicaid Other

## 2014-12-14 ENCOUNTER — Inpatient Hospital Stay: Payer: Medicaid Other | Attending: Internal Medicine

## 2014-12-14 DIAGNOSIS — E538 Deficiency of other specified B group vitamins: Secondary | ICD-10-CM | POA: Insufficient documentation

## 2014-12-14 DIAGNOSIS — Z79899 Other long term (current) drug therapy: Secondary | ICD-10-CM | POA: Insufficient documentation

## 2014-12-20 ENCOUNTER — Inpatient Hospital Stay: Payer: Medicaid Other

## 2014-12-20 VITALS — BP 94/57 | HR 81 | Resp 16

## 2014-12-20 DIAGNOSIS — D649 Anemia, unspecified: Secondary | ICD-10-CM

## 2014-12-20 DIAGNOSIS — Z79899 Other long term (current) drug therapy: Secondary | ICD-10-CM | POA: Diagnosis not present

## 2014-12-20 DIAGNOSIS — E538 Deficiency of other specified B group vitamins: Secondary | ICD-10-CM | POA: Diagnosis not present

## 2014-12-20 LAB — IRON AND TIBC
Iron: 185 ug/dL — ABNORMAL HIGH (ref 28–170)
Saturation Ratios: 44 % — ABNORMAL HIGH (ref 10.4–31.8)
TIBC: 422 ug/dL (ref 250–450)
UIBC: 237 ug/dL

## 2014-12-20 LAB — FERRITIN: Ferritin: 37 ng/mL (ref 11–307)

## 2014-12-20 LAB — HEMOGLOBIN: HEMOGLOBIN: 13 g/dL (ref 12.0–16.0)

## 2014-12-20 MED ORDER — CYANOCOBALAMIN 1000 MCG/ML IJ SOLN
1000.0000 ug | Freq: Once | INTRAMUSCULAR | Status: AC
  Administered 2014-12-20: 1000 ug via INTRAMUSCULAR
  Filled 2014-12-20: qty 1

## 2014-12-29 DIAGNOSIS — G588 Other specified mononeuropathies: Secondary | ICD-10-CM | POA: Insufficient documentation

## 2015-01-11 ENCOUNTER — Inpatient Hospital Stay: Payer: Medicaid Other

## 2015-01-11 VITALS — BP 95/46 | HR 61 | Temp 96.6°F | Resp 17

## 2015-01-11 DIAGNOSIS — E538 Deficiency of other specified B group vitamins: Secondary | ICD-10-CM

## 2015-01-11 MED ORDER — CYANOCOBALAMIN 1000 MCG/ML IJ SOLN
1000.0000 ug | Freq: Once | INTRAMUSCULAR | Status: AC
  Administered 2015-01-11: 1000 ug via INTRAMUSCULAR
  Filled 2015-01-11: qty 1

## 2015-02-08 ENCOUNTER — Inpatient Hospital Stay: Payer: Medicaid Other | Attending: Internal Medicine

## 2015-02-08 DIAGNOSIS — Z79899 Other long term (current) drug therapy: Secondary | ICD-10-CM | POA: Diagnosis not present

## 2015-02-08 DIAGNOSIS — E538 Deficiency of other specified B group vitamins: Secondary | ICD-10-CM | POA: Diagnosis not present

## 2015-02-08 MED ORDER — CYANOCOBALAMIN 1000 MCG/ML IJ SOLN
1000.0000 ug | Freq: Once | INTRAMUSCULAR | Status: AC
  Administered 2015-02-08: 1000 ug via INTRAMUSCULAR
  Filled 2015-02-08: qty 1

## 2015-03-07 ENCOUNTER — Other Ambulatory Visit: Payer: Self-pay | Admitting: Hematology and Oncology

## 2015-03-07 ENCOUNTER — Inpatient Hospital Stay: Payer: Medicaid Other | Attending: Internal Medicine

## 2015-03-07 VITALS — BP 109/51 | HR 75 | Temp 97.0°F | Resp 16

## 2015-03-07 DIAGNOSIS — Z79899 Other long term (current) drug therapy: Secondary | ICD-10-CM | POA: Insufficient documentation

## 2015-03-07 DIAGNOSIS — E538 Deficiency of other specified B group vitamins: Secondary | ICD-10-CM | POA: Diagnosis not present

## 2015-03-07 MED ORDER — CYANOCOBALAMIN 1000 MCG/ML IJ SOLN
1000.0000 ug | Freq: Once | INTRAMUSCULAR | Status: AC
  Administered 2015-03-07: 1000 ug via INTRAMUSCULAR
  Filled 2015-03-07: qty 1

## 2015-03-09 ENCOUNTER — Inpatient Hospital Stay: Payer: Medicaid Other

## 2015-03-16 ENCOUNTER — Emergency Department: Payer: Medicaid Other

## 2015-03-16 ENCOUNTER — Encounter: Payer: Self-pay | Admitting: Emergency Medicine

## 2015-03-16 ENCOUNTER — Emergency Department
Admission: EM | Admit: 2015-03-16 | Discharge: 2015-03-16 | Disposition: A | Discharge status: Home / Self Care | Payer: Medicaid Other | Attending: Emergency Medicine | Admitting: Emergency Medicine

## 2015-03-16 DIAGNOSIS — S60212A Contusion of left wrist, initial encounter: Secondary | ICD-10-CM

## 2015-03-16 DIAGNOSIS — Z87891 Personal history of nicotine dependence: Secondary | ICD-10-CM | POA: Diagnosis not present

## 2015-03-16 DIAGNOSIS — Y998 Other external cause status: Secondary | ICD-10-CM | POA: Insufficient documentation

## 2015-03-16 DIAGNOSIS — Y9389 Activity, other specified: Secondary | ICD-10-CM | POA: Insufficient documentation

## 2015-03-16 DIAGNOSIS — W108XXA Fall (on) (from) other stairs and steps, initial encounter: Secondary | ICD-10-CM | POA: Insufficient documentation

## 2015-03-16 DIAGNOSIS — S300XXA Contusion of lower back and pelvis, initial encounter: Secondary | ICD-10-CM | POA: Diagnosis not present

## 2015-03-16 DIAGNOSIS — Z79899 Other long term (current) drug therapy: Secondary | ICD-10-CM | POA: Diagnosis not present

## 2015-03-16 DIAGNOSIS — Z792 Long term (current) use of antibiotics: Secondary | ICD-10-CM | POA: Diagnosis not present

## 2015-03-16 DIAGNOSIS — R200 Anesthesia of skin: Secondary | ICD-10-CM | POA: Diagnosis not present

## 2015-03-16 DIAGNOSIS — S60211A Contusion of right wrist, initial encounter: Secondary | ICD-10-CM | POA: Diagnosis not present

## 2015-03-16 DIAGNOSIS — R202 Paresthesia of skin: Secondary | ICD-10-CM | POA: Diagnosis not present

## 2015-03-16 DIAGNOSIS — Y9289 Other specified places as the place of occurrence of the external cause: Secondary | ICD-10-CM | POA: Diagnosis not present

## 2015-03-16 DIAGNOSIS — S3992XA Unspecified injury of lower back, initial encounter: Secondary | ICD-10-CM | POA: Diagnosis present

## 2015-03-16 DIAGNOSIS — Z7951 Long term (current) use of inhaled steroids: Secondary | ICD-10-CM | POA: Insufficient documentation

## 2015-03-16 MED ORDER — ONDANSETRON 8 MG PO TBDP
8.0000 mg | ORAL_TABLET | Freq: Once | ORAL | Status: AC
  Administered 2015-03-16: 8 mg via ORAL
  Filled 2015-03-16: qty 1

## 2015-03-16 MED ORDER — HYDROCODONE-ACETAMINOPHEN 5-325 MG PO TABS
1.0000 | ORAL_TABLET | Freq: Once | ORAL | Status: AC
  Administered 2015-03-16: 1 via ORAL
  Filled 2015-03-16: qty 1

## 2015-03-16 MED ORDER — MELOXICAM 15 MG PO TABS: 15.0000 mg | ORAL_TABLET | Freq: Every day | ORAL | Status: DC

## 2015-03-16 MED ORDER — HYDROCODONE-ACETAMINOPHEN 5-325 MG PO TABS: 1.0000 | ORAL_TABLET | ORAL | Status: DC | PRN

## 2015-03-16 MED ORDER — ONDANSETRON 4 MG PO TBDP: 4.0000 mg | ORAL_TABLET | Freq: Three times a day (TID) | ORAL | Status: DC | PRN

## 2015-03-16 NOTE — Discharge Instructions (Signed)
Contusion A contusion is a deep bruise. Contusions are the result of a blunt injury to tissues and muscle fibers under the skin. The injury causes bleeding under the skin. The skin overlying the contusion may turn blue, purple, or yellow. Minor injuries will give you a painless contusion, but more severe contusions may stay painful and swollen for a few weeks.  CAUSES  This condition is usually caused by a blow, trauma, or direct force to an area of the body. SYMPTOMS  Symptoms of this condition include:  Swelling of the injured area.  Pain and tenderness in the injured area.  Discoloration. The area may have redness and then turn blue, purple, or yellow. DIAGNOSIS  This condition is diagnosed based on a physical exam and medical history. An X-ray, CT scan, or MRI may be needed to determine if there are any associated injuries, such as broken bones (fractures). TREATMENT  Specific treatment for this condition depends on what area of the body was injured. In general, the best treatment for a contusion is resting, icing, applying pressure to (compression), and elevating the injured area. This is often called the RICE strategy. Over-the-counter anti-inflammatory medicines may also be recommended for pain control.  HOME CARE INSTRUCTIONS   Rest the injured area.  If directed, apply ice to the injured area:  Put ice in a plastic bag.  Place a towel between your skin and the bag.  Leave the ice on for 20 minutes, 2-3 times per day.  If directed, apply light compression to the injured area using an elastic bandage. Make sure the bandage is not wrapped too tightly. Remove and reapply the bandage as directed by your health care provider.  If possible, raise (elevate) the injured area above the level of your heart while you are sitting or lying down.  Take over-the-counter and prescription medicines only as told by your health care provider. SEEK MEDICAL CARE IF:  Your symptoms do not  improve after several days of treatment.  Your symptoms get worse.  You have difficulty moving the injured area. SEEK IMMEDIATE MEDICAL CARE IF:   You have severe pain.  You have numbness in a hand or foot.  Your hand or foot turns pale or cold.   This information is not intended to replace advice given to you by your health care provider. Make sure you discuss any questions you have with your health care provider.   Document Released: 01/08/2005 Document Revised: 12/20/2014 Document Reviewed: 08/16/2014 Elsevier Interactive Patient Education 2016 [place] Injury The tailbone (coccyx) is the small bone at the lower end of the spine. A tailbone injury may involve stretched ligaments, bruising, or a broken bone (fracture). Tailbone injuries can be painful, and some may take a long time to heal. CAUSES This condition is often caused by falling and landing on the tailbone. Other causes include:  Repeated strain or friction from actions such as rowing and bicycling.  Childbirth. In some cases, the cause may not be known. RISK FACTORS This condition is more common in women than in men. SYMPTOMS Symptoms of this condition include:  Pain in the lower back, especially when sitting.  Pain or difficulty when standing up from a sitting position.  Bruising in the tailbone area.  Painful bowel movements.  In women, pain during intercourse. DIAGNOSIS This condition may be diagnosed based on your symptoms and a physical exam. X-rays may be taken if a fracture is suspected. You may also have other tests, such as  a CT scan or MRI. TREATMENT This condition may be treated with medicines to help relieve your pain. Most tailbone injuries heal on their own in 4-6 weeks. However, recovery time may be longer if the injury involves a fracture. HOME CARE INSTRUCTIONS  Take medicines only as directed by your health care provider.  If directed, apply ice to the injured  area:  Put ice in a plastic bag.  Place a towel between your skin and the bag.  Leave the ice on for 20 minutes, 2-3 times per day for the first 1-2 days.  Sit on a large, rubber or inflated ring or cushion to ease your pain. Lean forward when you are sitting to help decrease discomfort.  Avoid sitting for long periods of time.  Increase your activity as the pain allows. Perform any exercises that are recommended by your health care provider or physical therapist.  If you have pain during bowel movements, use stool softeners as directed by your health care provider.  Eat a diet that includes plenty of fiber to help prevent constipation.  Keep all follow-up visits as directed by your health care provider. This is important. PREVENTION Wear appropriate padding and sports gear when bicycling and rowing. This can help to prevent developing an injury that is caused by repeated strain or friction. SEEK MEDICAL CARE IF:  Your pain becomes worse.  Your bowel movements cause a great deal of discomfort.  You are unable to have a bowel movement.  You have uncontrolled urine loss (urinary incontinence).  You have a fever.   This information is not intended to replace advice given to you by your health care provider. Make sure you discuss any questions you have with your health care provider.   Document Released: 03/28/2000 Document Revised: 08/15/2014 Document Reviewed: 03/27/2014 Elsevier Interactive Patient Education 2016 [employer]  Periosteal Hematoma Periosteal hematoma (bone bruise) is a localized, tender, raised area close to the bone. It can occur from a small hidden fracture of the bone, following surgery, or from other trauma to the area. It typically occurs in bones located close to the surface of the skin, such as the shin, knee, and heel bone. Although it may take 2 or more weeks to completely heal, bone bruises typically are not associated with permanent or serious damage  to the bone. If you are taking blood thinners, you may be at greater risk for such injuries.  CAUSES  A bone bruise is usually caused by high-impact trauma to the bone, but it can be caused by sports injuries or twisting injuries. SIGNS AND SYMPTOMS   Severe pain around the injured area that typically lasts longer than a normal bruise.  Difficulty using the bruised area.  Tender, raised area close to the bone.  Discoloration or swelling of the bruised area. DIAGNOSIS  You may need an MRI of the injured area to confirm a bone bruise if your health care provider feels it is necessary. A regular X-ray will not detect a bone bruise, but it will detect a broken bone (fracture). An X-ray may be taken to rule out any fractures. TREATMENT  Often, the best treatment for a bone bruise is resting, icing, and applying cold compresses to the injured area. Over-the-counter medicines may also be recommended for pain control. HOME CARE INSTRUCTIONS  Some things you can do to improve the condition are:   Rest and elevate the area of injury as long as it is very tender or swollen.  Apply ice to  the injured area:  Put ice in a plastic bag.  Place a towel between your skin and the bag.  Leave the ice on for 20 minutes, 2-3 times a day.  Use an elastic wrap to reduce swelling and protect the injured area. Make sure it is not applied too tightly. If the area around the wrap becomes cold or blue, the wrap is too tight. Wrap it more loosely.  For activity:  Follow your health care provider's instructions about whether walking with crutches is required. This will depend on how serious your condition is.  Start weight bearing gradually on the bruised part.  Continue to use crutches or a cane until you can stand without causing pain, or as instructed.  If a plaster splint was applied:  Wear the splint until you are seen for a follow-up exam.  Rest it on nothing harder than a pillow the first 24  hours.  Do not put weight on it.  Do not get it wet. You may take it off to take a shower or bath.  You may have been given an elastic bandage to use with or without the plaster splint. The splint is too tight if you have numbness or tingling, or if the skin around the bandage becomes cold and blue. Adjust the bandage to make it comfortable.  If an air splint was applied:  You may alter the amount of air in the splint as needed for comfort.  You may take it off at night and to take a shower or bath.  If the injury was in either leg, wiggle your toes in the splint several times per day if you are able.  Only take over-the-counter or prescription medicines for pain, discomfort, or fever as directed by your health care provider.  Keep all follow-up visits with your health care provider. This includes any orthopedic referrals, physical therapy, and rehabilitation. Any delay in getting necessary care could result in a delay or failure of the bones to heal. SEEK MEDICAL CARE IF:   You have an increase in bruising, swelling, tenderness, heat, or pain over your injury.  You notice coldness of your toes that does not improve after removing a splint or bandage.  Your pain is not lessened after you take medicine.  You have increased difficulty bearing weight on the injured leg, if the injury is in either leg. SEEK IMMEDIATE MEDICAL CARE IF:   You have severe pain near the injured area or severe pain with stretching.  You have increased swelling that resulted in a tense, hard area or a loss of sensation in the area of the injury.  You have pale, cool skin below the area of the injury (in an extremity) that does not go away after removing a splint or bandage. MAKE SURE YOU:   Understand these instructions.  Will watch your condition.  Will get help right away if you are not doing well or get worse.   This information is not intended to replace advice given to you by your health care  provider. Make sure you discuss any questions you have with your health care provider.   Document Released: 05/08/2004 Document Revised: 01/19/2013 Document Reviewed: 09/17/2012 Elsevier Interactive Patient Education [employer].

## 2015-03-16 NOTE — ED Notes (Signed)
States she fell down step about 1 week ago   conts to have pain top back and both wrists

## 2015-03-16 NOTE — ED Provider Notes (Signed)
[facility] Emergency Department Provider Note  ____________________________________________  Time seen: Approximately 12:23 PM  I have reviewed the triage vital signs and the nursing notes.   HISTORY  Chief Complaint Fall    HPI YALEXA Pruitt is a 39 y.o. female who presents to emergency department for bilateral wrist pain and coccyx pain. She states that she fell down a flight of steps about a week ago and is having continued pain to the areas. 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 She states that she has chronic scar tissue, sciatica, and numbness and tingling in lower extremities. She states that the pain is "different" than normal. She was seen by her physical therapist yesterday and was "adjusted". She states that has helped however, the physical therapist states that she must be assessed by medical provider prior to returning for physical therapy. Patient denies any bowel or bladder dysfunction from baseline. She denies any increased symptoms in lower extremities. She just endorses more pain in the region. She is also complaining of bilateral wrist pain. She states that the pain in the right hand is more distal radial and the pain in left wrist is more medial carpal bones. She denies any numbness or tingling in her fingers. She denies any loss of function. Patient states the pain is sharp in both wrists as well as coccyx. It is constant, it is severe, it is unresolved with Tylenol and ibuprofen and muscle relaxers.   Past Medical History  Diagnosis Date  . 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 Cervical cancer [facility])     removed in July 2009  . Normocytic anemia 08/21/2014  . Vitamin B 12 deficiency 08/21/2014    Patient Active Problem List   Diagnosis Date Noted  . Normocytic anemia 08/21/2014  . Vitamin B 12 deficiency 08/21/2014  . Cervical pain  08/25/2012  . Pain in joint, pelvic region and thigh 07/16/2012  . 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 Balance disorder 07/16/2012  . Personal history of fall 07/16/2012  . 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([place]) 12/23/2010  . 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 [place] SP CORD&NERV SYSTEM 05/08/2010  . H N P-LUMBAR 04/23/2010  . BACK PAIN 01/28/2010    Past Surgical History  Procedure Laterality Date  . Back surgery    . Bladder surgery    . Colostomy      after flex sig as 39 year old.   . Tubal ligation    . Removal of cervical cancer    . Colostomy reversal    . Exploratory laparotomy      with colostomy placement    Current Outpatient Rx  Name  Route  Sig  Dispense  Refill  . diclofenac sodium (VOLTAREN) 1 % GEL   Topical   Apply 1 application topically 4 (four) times daily as needed. arthritis         . doxycycline (VIBRAMYCIN) 100 MG capsule   Oral   Take 1 capsule (100 mg total) by mouth 2 (two) times daily.   14 capsule   0   . fluticasone (FLONASE) 50 MCG/ACT nasal spray   Nasal   Place 2 sprays into the nose daily.         Marland Kitchen HYDROcodone-acetaminophen (NORCO/VICODIN) 5-325 MG tablet   Oral   Take 1 tablet by mouth every 4 (four) hours as needed  for moderate pain.   20 tablet   0   . levonorgestrel-ethinyl estradiol (SEASONALE,INTROVALE,JOLESSA) 0.15-0.03 MG tablet   Oral   Take 1 tablet by mouth daily.         Marland Kitchen lidocaine (LIDODERM) 5 %   Transdermal   Place 1 patch onto the skin daily. Remove & Discard patch within 12 hours or as directed by MD          . loratadine (CLARITIN) 10 MG tablet   Oral   Take 10 mg by mouth daily.           . meloxicam (MOBIC) 15 MG tablet   Oral   Take 1 tablet (15 mg total) by mouth daily.   30 tablet   0   . methocarbamol (ROBAXIN) 750 MG tablet   Oral   Take 750 mg by mouth 3 (three) times daily.         . metroNIDAZOLE (FLAGYL) 500 MG  tablet   Oral   Take 1 tablet (500 mg total) by mouth 2 (two) times daily.   14 tablet   0   . montelukast (SINGULAIR) 10 MG tablet   Oral   Take 10 mg by mouth at bedtime.         . ondansetron (ZOFRAN) 4 MG tablet   Oral   Take 1 tablet (4 mg total) by mouth daily as needed for nausea or vomiting.   20 tablet   1   . ondansetron (ZOFRAN-ODT) 4 MG disintegrating tablet   Oral   Take 1 tablet (4 mg total) by mouth every 8 (eight) hours as needed for nausea or vomiting.   20 tablet   0   . oxyCODONE-acetaminophen (ROXICET) 5-325 MG per tablet   Oral   Take 1 tablet by mouth every 6 (six) hours as needed.   20 tablet   0   . PIRMELLA 7/7/7 0.5/0.75/1-35 MG-MCG tablet      TAKE ONE TABLET BY MOUTH ONCE DAILY   28 tablet   1     Needs appt     Allergies Ciprofloxacin; Azithromycin; Dilaudid; Sulfa antibiotics; and Tramadol hcl  Family History  Problem Relation Age of Onset  . Colon cancer Neg Hx     Social History Social History  Substance Use Topics  . Smoking status: Former Smoker    Types: Cigarettes  . Smokeless tobacco: None     Comment: 1-3 cigarettes  . Alcohol Use: Yes     Comment: a beer here and there    Review of Systems Constitutional: No fever/chills Eyes: No visual changes. ENT: No sore throat. Cardiovascular: Denies chest pain. Respiratory: Denies shortness of breath. Gastrointestinal: No abdominal pain.  No nausea, no vomiting.  No diarrhea.  No constipation. Genitourinary: Negative for dysuria. Musculoskeletal: Endorses coccyx pain. Endorses bilateral wrist pain. Skin: Negative for rash. Neurological: Negative for headaches, focal weakness or numbness.  10-point ROS otherwise negative.  ____________________________________________   PHYSICAL EXAM:  VITAL SIGNS: ED Triage Vitals  Enc Vitals Group     BP 03/16/15 1031 112/62 mmHg     Pulse Rate 03/16/15 1031 82     Resp 03/16/15 1031 18     Temp 03/16/15 1031 98.1 F (36.7  C)     Temp src --      SpO2 03/16/15 1031 99 %     Weight 03/16/15 1031 112 lb (50.803 kg)     Height 03/16/15 1031 5\' 4"  (1.626 m)  Head Cir --      Peak Flow --      Pain Score 03/16/15 1007 9     Pain Loc --      Pain Edu? --      Excl. in [place]? --     Constitutional: Alert and oriented. Well appearing and in no acute distress. Eyes: Conjunctivae are normal. PERRL. EOMI. Head: Atraumatic. Nose: No congestion/rhinnorhea. Mouth/Throat: Mucous membranes are moist.  Oropharynx non-erythematous. Neck: No stridor.  No cervical spine tenderness to palpation. Cardiovascular: Normal rate, regular rhythm. Grossly normal heart sounds.  Good peripheral circulation. Respiratory: Normal respiratory effort.  No retractions. Lungs CTAB. Gastrointestinal: Soft and nontender. No distention. No abdominal bruits. No CVA tenderness. Musculoskeletal: No lower extremity tenderness nor edema.  No joint effusions. No visible deformity to lower spine. No ecchymosis, contusion, abrasion, laceration. She noticed tender to palpation midline sacrum area. Patient is tender to palpation bilateral sciatic notches. Pulses intact distally. Patient has reduced sensation in bilateral lower extremities and per patient there is no change from baseline. No visible deformity to bilateral wrists. No edema noted. Patient is diffusely tender to palpation over the distal radius and right wrist. No tenderness to palpation over carpal bones or metacarpal bones. Sensation and function intact. Brisk cap refill. Patient is diffusely tender to palpation over the medial carpal bones left wrist. No palpable abnormality. Sensation, cap refill, function intact left phalanges. Neurologic:  Normal speech and language. No gross focal neurologic deficits are appreciated. No gait instability. Skin:  Skin is warm, dry and intact. No rash noted. Psychiatric: Mood and affect are normal. Speech and behavior are  normal.  ____________________________________________   LABS (all labs ordered are listed, but only abnormal results are displayed)  Labs Reviewed - No data to display ____________________________________________  EKG   ____________________________________________  RADIOLOGY  Right wrist x-ray Impression: No fracture or dislocation.  Left wrist x-ray Impression: No fracture or dislocation.  Sacrum/coccyx x-ray Impression: No fracture or diastases. No appreciable arthropathy. ____________________________________________   PROCEDURES  Procedure(s) performed: None  Critical Care performed: No  ____________________________________________   INITIAL IMPRESSION / ASSESSMENT AND PLAN / ED COURSE  Pertinent labs & imaging results that were available during my care of the patient were reviewed by me and considered in my medical decision making (see chart for details).  Agents history, symptoms, physical exam are taken and the consideration for diagnosis. Patient's sinuses consistent with contusion to sacrum/coccyx area as well as contusions to bilateral wrists. Advised patient of findings and diagnosis and she verbalizes understanding same. I'll provide patient symptomatic medications for improvement. She verbalizes understanding of the diagnosis and treatment plan and verbalizes compliance with same.  Patient may return to physical therapy with her therapist on her regular schedule. Patient is to offer follow-up with her neurologist to inform him of injury, as well as ED visit and treatment plan. ____________________________________________   FINAL CLINICAL IMPRESSION(S) / ED DIAGNOSES  Final diagnoses:  Coccyx contusion, initial encounter  Wrist contusion, right, initial encounter  Wrist contusion, left, initial encounter      Darletta Moll, PA-C 03/16/15 [place], MD 03/16/15 1506

## 2015-04-03 ENCOUNTER — Inpatient Hospital Stay: Payer: Medicaid Other | Attending: Internal Medicine

## 2015-04-03 DIAGNOSIS — Z79899 Other long term (current) drug therapy: Secondary | ICD-10-CM | POA: Insufficient documentation

## 2015-04-03 DIAGNOSIS — E538 Deficiency of other specified B group vitamins: Secondary | ICD-10-CM

## 2015-04-03 MED ORDER — CYANOCOBALAMIN 1000 MCG/ML IJ SOLN
1000.0000 ug | Freq: Once | INTRAMUSCULAR | Status: AC
  Administered 2015-04-03: 1000 ug via INTRAMUSCULAR
  Filled 2015-04-03: qty 1

## 2015-04-05 ENCOUNTER — Inpatient Hospital Stay: Payer: Medicaid Other

## 2015-05-03 ENCOUNTER — Inpatient Hospital Stay: Payer: Medicaid Other | Attending: Internal Medicine

## 2015-05-03 DIAGNOSIS — E538 Deficiency of other specified B group vitamins: Secondary | ICD-10-CM | POA: Insufficient documentation

## 2015-05-03 DIAGNOSIS — Z79899 Other long term (current) drug therapy: Secondary | ICD-10-CM | POA: Insufficient documentation

## 2015-05-07 ENCOUNTER — Other Ambulatory Visit: Payer: Self-pay | Admitting: Neurology

## 2015-05-07 DIAGNOSIS — R200 Anesthesia of skin: Secondary | ICD-10-CM

## 2015-05-07 DIAGNOSIS — Z9181 History of falling: Secondary | ICD-10-CM

## 2015-05-11 ENCOUNTER — Inpatient Hospital Stay: Payer: Medicaid Other

## 2015-05-11 DIAGNOSIS — E538 Deficiency of other specified B group vitamins: Secondary | ICD-10-CM

## 2015-05-11 DIAGNOSIS — Z79899 Other long term (current) drug therapy: Secondary | ICD-10-CM | POA: Diagnosis not present

## 2015-05-11 MED ORDER — CYANOCOBALAMIN 1000 MCG/ML IJ SOLN
1000.0000 ug | Freq: Once | INTRAMUSCULAR | Status: AC
  Administered 2015-05-11: 1000 ug via INTRAMUSCULAR
  Filled 2015-05-11: qty 1

## 2015-05-25 ENCOUNTER — Ambulatory Visit
Admission: RE | Admit: 2015-05-25 | Discharge: 2015-05-25 | Disposition: A | Discharge status: Home / Self Care | Payer: Medicaid Other | Source: Ambulatory Visit | Attending: Neurology | Admitting: Neurology

## 2015-05-25 DIAGNOSIS — Q059 Spina bifida, unspecified: Secondary | ICD-10-CM | POA: Insufficient documentation

## 2015-05-25 DIAGNOSIS — Q068 Other specified congenital malformations of spinal cord: Secondary | ICD-10-CM | POA: Diagnosis present

## 2015-05-25 DIAGNOSIS — M5127 Other intervertebral disc displacement, lumbosacral region: Secondary | ICD-10-CM | POA: Insufficient documentation

## 2015-05-25 DIAGNOSIS — Z9181 History of falling: Secondary | ICD-10-CM

## 2015-05-25 DIAGNOSIS — R200 Anesthesia of skin: Secondary | ICD-10-CM | POA: Diagnosis present

## 2015-05-25 MED ORDER — GADOBENATE DIMEGLUMINE 529 MG/ML IV SOLN
10.0000 mL | Freq: Once | INTRAVENOUS | Status: AC | PRN
  Administered 2015-05-25: 10 mL via INTRAVENOUS

## 2015-05-30 ENCOUNTER — Other Ambulatory Visit: Payer: Self-pay | Admitting: Nurse Practitioner

## 2015-05-30 DIAGNOSIS — Z1231 Encounter for screening mammogram for malignant neoplasm of breast: Secondary | ICD-10-CM

## 2015-05-30 DIAGNOSIS — E785 Hyperlipidemia, unspecified: Secondary | ICD-10-CM | POA: Insufficient documentation

## 2015-05-31 ENCOUNTER — Inpatient Hospital Stay: Payer: Medicaid Other | Attending: Internal Medicine

## 2015-05-31 DIAGNOSIS — Z79899 Other long term (current) drug therapy: Secondary | ICD-10-CM | POA: Insufficient documentation

## 2015-05-31 DIAGNOSIS — E538 Deficiency of other specified B group vitamins: Secondary | ICD-10-CM | POA: Diagnosis not present

## 2015-05-31 MED ORDER — CYANOCOBALAMIN 1000 MCG/ML IJ SOLN
1000.0000 ug | Freq: Once | INTRAMUSCULAR | Status: AC
  Administered 2015-05-31: 1000 ug via INTRAMUSCULAR

## 2015-06-13 ENCOUNTER — Ambulatory Visit
Admission: RE | Admit: 2015-06-13 | Discharge: 2015-06-13 | Disposition: A | Discharge status: Home / Self Care | Payer: Medicaid Other | Source: Ambulatory Visit | Attending: Nurse Practitioner | Admitting: Nurse Practitioner

## 2015-06-13 DIAGNOSIS — Z1231 Encounter for screening mammogram for malignant neoplasm of breast: Secondary | ICD-10-CM | POA: Insufficient documentation

## 2015-06-22 ENCOUNTER — Other Ambulatory Visit: Payer: Self-pay | Admitting: *Deleted

## 2015-06-22 DIAGNOSIS — D649 Anemia, unspecified: Secondary | ICD-10-CM

## 2015-06-27 DIAGNOSIS — B009 Herpesviral infection, unspecified: Secondary | ICD-10-CM | POA: Insufficient documentation

## 2015-06-28 ENCOUNTER — Inpatient Hospital Stay: Payer: Medicaid Other

## 2015-06-28 ENCOUNTER — Inpatient Hospital Stay: Payer: Medicaid Other | Attending: Internal Medicine

## 2015-06-28 ENCOUNTER — Inpatient Hospital Stay: Payer: Medicaid Other | Admitting: Internal Medicine

## 2015-06-28 DIAGNOSIS — D649 Anemia, unspecified: Secondary | ICD-10-CM | POA: Insufficient documentation

## 2015-06-28 DIAGNOSIS — R531 Weakness: Secondary | ICD-10-CM | POA: Insufficient documentation

## 2015-06-28 DIAGNOSIS — Z8541 Personal history of malignant neoplasm of cervix uteri: Secondary | ICD-10-CM | POA: Insufficient documentation

## 2015-06-28 DIAGNOSIS — Z87891 Personal history of nicotine dependence: Secondary | ICD-10-CM | POA: Insufficient documentation

## 2015-06-28 DIAGNOSIS — R5383 Other fatigue: Secondary | ICD-10-CM | POA: Insufficient documentation

## 2015-06-28 DIAGNOSIS — E538 Deficiency of other specified B group vitamins: Secondary | ICD-10-CM | POA: Insufficient documentation

## 2015-06-28 DIAGNOSIS — Z79899 Other long term (current) drug therapy: Secondary | ICD-10-CM | POA: Insufficient documentation

## 2015-07-05 ENCOUNTER — Inpatient Hospital Stay: Payer: Medicaid Other

## 2015-07-05 ENCOUNTER — Inpatient Hospital Stay (HOSPITAL_BASED_OUTPATIENT_CLINIC_OR_DEPARTMENT_OTHER): Payer: Medicaid Other | Admitting: Family Medicine

## 2015-07-05 VITALS — BP 98/65 | HR 90 | Temp 97.3°F

## 2015-07-05 DIAGNOSIS — Z87891 Personal history of nicotine dependence: Secondary | ICD-10-CM

## 2015-07-05 DIAGNOSIS — E538 Deficiency of other specified B group vitamins: Secondary | ICD-10-CM | POA: Diagnosis not present

## 2015-07-05 DIAGNOSIS — Z8541 Personal history of malignant neoplasm of cervix uteri: Secondary | ICD-10-CM

## 2015-07-05 DIAGNOSIS — R531 Weakness: Secondary | ICD-10-CM | POA: Diagnosis not present

## 2015-07-05 DIAGNOSIS — Z79899 Other long term (current) drug therapy: Secondary | ICD-10-CM

## 2015-07-05 DIAGNOSIS — D649 Anemia, unspecified: Secondary | ICD-10-CM

## 2015-07-05 DIAGNOSIS — R5383 Other fatigue: Secondary | ICD-10-CM

## 2015-07-05 LAB — CBC WITH DIFFERENTIAL/PLATELET
BASOS PCT: 2 %
Basophils Absolute: 0.1 10*3/uL (ref 0–0.1)
EOS ABS: 0.1 10*3/uL (ref 0–0.7)
Eosinophils Relative: 2 %
HCT: 38.2 % (ref 35.0–47.0)
Hemoglobin: 12.9 g/dL (ref 12.0–16.0)
Lymphocytes Relative: 29 %
Lymphs Abs: 2.1 10*3/uL (ref 1.0–3.6)
MCH: 30.5 pg (ref 26.0–34.0)
MCHC: 33.8 g/dL (ref 32.0–36.0)
MCV: 90.3 fL (ref 80.0–100.0)
MONO ABS: 0.4 10*3/uL (ref 0.2–0.9)
MONOS PCT: 5 %
Neutro Abs: 4.5 10*3/uL (ref 1.4–6.5)
Neutrophils Relative %: 62 %
Platelets: 268 10*3/uL (ref 150–440)
RBC: 4.23 MIL/uL (ref 3.80–5.20)
RDW: 13.7 % (ref 11.5–14.5)
WBC: 7.3 10*3/uL (ref 3.6–11.0)

## 2015-07-05 LAB — FERRITIN: Ferritin: 26 ng/mL (ref 11–307)

## 2015-07-05 LAB — FOLATE: FOLATE: 33 ng/mL (ref >5.9)

## 2015-07-05 LAB — IRON AND TIBC
IRON: 161 ug/dL (ref 28–170)
Saturation Ratios: 36 % — ABNORMAL HIGH (ref 10.4–31.8)
TIBC: 448 ug/dL (ref 250–450)
UIBC: 287 ug/dL

## 2015-07-05 MED ORDER — CYANOCOBALAMIN 1000 MCG/ML IJ SOLN
1000.0000 ug | Freq: Once | INTRAMUSCULAR | Status: AC
  Administered 2015-07-05: 1000 ug via INTRAMUSCULAR

## 2015-07-05 MED ORDER — CYANOCOBALAMIN 1000 MCG PO TABS: 1000.0000 ug | ORAL_TABLET | Freq: Every day | ORAL | Status: DC

## 2015-07-05 NOTE — Progress Notes (Signed)
[facility]  [phone]  [phone]     Melissa Pruitt DOB: [birth year]  MR#: [id number]  [id number]  Patient Care Team: [facility] as PCP - General  CHIEF COMPLAINT:  Chief Complaint  Patient presents with  . Follow-up    anemia, B12 deficiency  Normocytic anemia, recent progressive fatigability. Diagnosed with B12 deficiency on 04/27/14 (despite taking oral B12). Prior labs done on 03/16/14 reported B-12 level at 212 with reference range 211-911, hemoglobin 12.7, MCV 92, WBC 5500, platelets 329. In August 2015, Cr 0.7, LFT unremarkable, Hb is 11.9.  Workup done on 04/27/14 - Serum B12 low at 175, methylmalonic acid level normal at 376, iron study and folate normal, intrinsic factor antibody negative at 1.1 (reference range 0.0 to 1.1), hemoglobin 12.7, platelets 255, WBC 8400 with unremarkable differential. Patient has been started on parenteral B12 therapy on 05/01/14.   INTERVAL HISTORY:  Patient returns for continued hematology followup. She has been getting B12 injections since May 01, 2014. States that this has made her overall feel better, she has noticed some change in the chronic sensory symptoms in her lower extremities. Denies any dyspnea or orthopnea, chest pain or palpitation. No bleeding issues. She reports feeling more fatigued than usual but is unsure if it is related to other medical and social issues.   REVIEW OF SYSTEMS:   Review of Systems  Constitutional: Positive for malaise/fatigue. Negative for fever, chills, weight loss and diaphoresis.  HENT: Positive for congestion.   Eyes: Negative.   Respiratory: Negative for cough, hemoptysis, sputum production, shortness of breath and wheezing.   Cardiovascular: Negative for chest pain, palpitations, orthopnea, claudication, leg swelling and PND.  Gastrointestinal: Negative for heartburn, nausea, vomiting, abdominal pain, diarrhea, constipation, blood in stool and melena.    Genitourinary: Negative.   Musculoskeletal: Negative.   Skin: Negative.   Neurological: Positive for focal weakness. Negative for dizziness, tingling, seizures and weakness.  Endo/Heme/Allergies: Does not bruise/bleed easily.  Psychiatric/Behavioral: Negative for depression. The patient is not nervous/anxious and does not have insomnia.     As per HPI. Otherwise, a complete review of systems is negatve.    PAST MEDICAL HISTORY: Past Medical History  Diagnosis Date  . 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[facility])     sacro/coccygeal mass excision  . Cervical cancer [facility])     removed in July 2009  . Normocytic anemia 08/21/2014  . Vitamin B 12 deficiency 08/21/2014    PAST SURGICAL HISTORY: Past Surgical History  Procedure Laterality Date  . Back surgery    . Bladder surgery    . Colostomy      after flex sig as 41 year old.   . Tubal ligation    . Removal of cervical cancer    . Colostomy reversal    . Exploratory laparotomy      with colostomy placement    FAMILY HISTORY Family History  Problem Relation Age of Onset  . Colon cancer Neg Hx   . Breast cancer Neg Hx     GYNECOLOGIC HISTORY:  Patient's last menstrual period was 05/18/2015.     ADVANCED DIRECTIVES:    HEALTH MAINTENANCE: Social History  Substance Use Topics  . Smoking status: Former Smoker    Types: Cigarettes  . Smokeless tobacco: Not on file     Comment: 1-3 cigarettes  . Alcohol Use: Yes     Comment: a beer here and there     Colonoscopy:  PAP:  Bone density:  Mammogram:  05/2015  Allergies  Allergen Reactions  . Ciprofloxacin Anaphylaxis    Heart and nerve issues  . Azithromycin Nausea And Vomiting  . Dilaudid [Hydromorphone Hcl] Other (See Comments)    Makes patient feel like she out of it  . Sulfa Antibiotics Nausea And Vomiting  . Tramadol Hcl     REACTION: HEART PALPITATIONS /SWELLING /SOB    Current Outpatient Prescriptions  Medication Sig Dispense Refill  . diclofenac  sodium (VOLTAREN) 1 % GEL Apply 1 application topically 4 (four) times daily as needed. arthritis    . fluticasone (FLONASE) 50 MCG/ACT nasal spray Place 2 sprays into the nose daily.    Marland Kitchen levonorgestrel-ethinyl estradiol (SEASONALE,INTROVALE,JOLESSA) 0.15-0.03 MG tablet Take 1 tablet by mouth daily.    Marland Kitchen lidocaine (LIDODERM) 5 % Place 1 patch onto the skin daily. Remove & Discard patch within 12 hours or as directed by MD     . loratadine (CLARITIN) 10 MG tablet Take 10 mg by mouth daily.      . methocarbamol (ROBAXIN) 750 MG tablet Take 750 mg by mouth 3 (three) times daily.    . montelukast (SINGULAIR) 10 MG tablet Take 10 mg by mouth at bedtime.    . nitrofurantoin, macrocrystal-monohydrate, (MACROBID) 100 MG capsule Take 100 mg by mouth at bedtime.    . ondansetron (ZOFRAN) 4 MG tablet Take 1 tablet (4 mg total) by mouth daily as needed for nausea or vomiting. 20 tablet 1  . vitamin B-12 1000 MCG tablet Take 1 tablet (1,000 mcg total) by mouth daily. 30 tablet 5   No current facility-administered medications for this visit.   Facility-Administered Medications Ordered in Other Visits  Medication Dose Route Frequency Provider Last Rate Last Dose  . cyanocobalamin ((VITAMIN B-12)) injection 1,000 mcg  1,000 mcg Intramuscular Once Lequita Asal, MD        OBJECTIVE: BP 98/65 mmHg  Pulse 90  Temp(Src) 97.3 F (36.3 C) (Tympanic)  LMP 05/18/2015   There is no weight on file to calculate BMI.    ECOG FS:0 - Asymptomatic  General: Well-developed, well-nourished, no acute distress. Eyes: Pink conjunctiva, anicteric sclera. HEENT: Normocephalic, moist mucous membranes, clear oropharnyx. Lungs: Clear to auscultation bilaterally. Heart: Regular rate and rhythm. No rubs, murmurs, or gallops. Musculoskeletal: No edema, cyanosis, or clubbing. Neuro: Alert, answering all questions appropriately. Cranial nerves grossly intact. Skin: No rashes or petechiae noted. Psych: Normal  affect.   LAB RESULTS:  Appointment on 07/05/2015  Component Date Value Ref Range Status  . WBC 07/05/2015 7.3  3.6 - 11.0 K/uL Final  . RBC 07/05/2015 4.23  3.80 - 5.20 MIL/uL Final  . Hemoglobin 07/05/2015 12.9  12.0 - 16.0 g/dL Final  . HCT 07/05/2015 38.2  35.0 - 47.0 % Final  . MCV 07/05/2015 90.3  80.0 - 100.0 fL Final  . MCH 07/05/2015 30.5  26.0 - 34.0 pg Final  . MCHC 07/05/2015 33.8  32.0 - 36.0 g/dL Final  . RDW 07/05/2015 13.7  11.5 - 14.5 % Final  . Platelets 07/05/2015 268  150 - 440 K/uL Final  . Neutrophils Relative % 07/05/2015 62   Final  . Neutro Abs 07/05/2015 4.5  1.4 - 6.5 K/uL Final  . Lymphocytes Relative 07/05/2015 29   Final  . Lymphs Abs 07/05/2015 2.1  1.0 - 3.6 K/uL Final  . Monocytes Relative 07/05/2015 5   Final  . Monocytes Absolute 07/05/2015 0.4  0.2 - 0.9 K/uL Final  . Eosinophils Relative 07/05/2015 2  Final  . Eosinophils Absolute 07/05/2015 0.1  0 - 0.7 K/uL Final  . Basophils Relative 07/05/2015 2   Final  . Basophils Absolute 07/05/2015 0.1  0 - 0.1 K/uL Final    STUDIES: No results found.  ASSESSMENT:  Normocytic Anemia.  PLAN:   1. Normocytic anemia. With progressive fatigue. Diagnosed with B12 deficiency on 04/27/14 despite taking oral B12, no evidence of pernicious anemia given intrinsic factor antibody in the normal reference range. Patient clinically is feeling better after starting B12 parenteral therapy, she is concerned about trying oral B12 since she was found to have deficiency despite taking oral B12 in the past. Plan is to continue on parenteral B12 therapy at this time. We will continue on B12 1000 mcg IM injection once every 4 weeks and monitor. Also encouraged patient to trial sublingual B12 as well. Will await ferritin, iron/ TIBC, and folate levels as well.   Will recheck labs in 6 months, continue with monthly B12 injections, and next provider visit in 1 year. Will notify patient if there is any change in treatment plan  based on pending labs from today.  Patient expressed understanding and was in agreement with this plan. She also understands that She can call clinic at any time with any questions, concerns, or complaints.   Dr. Oliva Bustard was available for consultation and review of plan of care for this patient.   Evlyn Kanner, NP   07/05/2015 11:14 AM

## 2015-07-18 ENCOUNTER — Telehealth: Payer: Self-pay | Admitting: Family Medicine

## 2015-07-18 NOTE — Telephone Encounter (Signed)
After discussing with L Herring, AGNP-C, pt advised she does not require an iron infusion

## 2015-07-18 NOTE — Telephone Encounter (Signed)
She called back to inquire about what iron and B12 was before she got her injection. She also wants to know what Saturation Rate is.

## 2015-07-23 ENCOUNTER — Other Ambulatory Visit: Payer: Self-pay | Admitting: Nurse Practitioner

## 2015-07-23 DIAGNOSIS — N921 Excessive and frequent menstruation with irregular cycle: Secondary | ICD-10-CM

## 2015-07-23 DIAGNOSIS — N83209 Unspecified ovarian cyst, unspecified side: Secondary | ICD-10-CM

## 2015-07-26 ENCOUNTER — Ambulatory Visit
Admission: RE | Admit: 2015-07-26 | Discharge: 2015-07-26 | Disposition: A | Discharge status: Home / Self Care | Payer: Medicaid Other | Source: Ambulatory Visit | Attending: Nurse Practitioner | Admitting: Nurse Practitioner

## 2015-07-26 DIAGNOSIS — N921 Excessive and frequent menstruation with irregular cycle: Secondary | ICD-10-CM | POA: Diagnosis present

## 2015-07-26 DIAGNOSIS — N83209 Unspecified ovarian cyst, unspecified side: Secondary | ICD-10-CM

## 2015-07-26 DIAGNOSIS — D259 Leiomyoma of uterus, unspecified: Secondary | ICD-10-CM | POA: Diagnosis not present

## 2015-08-02 ENCOUNTER — Inpatient Hospital Stay: Payer: Medicaid Other | Attending: Internal Medicine

## 2015-08-02 VITALS — BP 85/48 | HR 76 | Temp 97.1°F | Resp 16

## 2015-08-02 DIAGNOSIS — Z79899 Other long term (current) drug therapy: Secondary | ICD-10-CM | POA: Insufficient documentation

## 2015-08-02 DIAGNOSIS — E538 Deficiency of other specified B group vitamins: Secondary | ICD-10-CM | POA: Diagnosis present

## 2015-08-02 MED ORDER — CYANOCOBALAMIN 1000 MCG/ML IJ SOLN
1000.0000 ug | Freq: Once | INTRAMUSCULAR | Status: AC
  Administered 2015-08-02: 1000 ug via INTRAMUSCULAR
  Filled 2015-08-02: qty 1

## 2015-08-29 ENCOUNTER — Inpatient Hospital Stay: Payer: Medicaid Other | Attending: Internal Medicine

## 2015-08-29 VITALS — Resp 16

## 2015-08-29 DIAGNOSIS — Z79899 Other long term (current) drug therapy: Secondary | ICD-10-CM | POA: Diagnosis not present

## 2015-08-29 DIAGNOSIS — E538 Deficiency of other specified B group vitamins: Secondary | ICD-10-CM | POA: Insufficient documentation

## 2015-08-29 MED ORDER — CYANOCOBALAMIN 1000 MCG/ML IJ SOLN
1000.0000 ug | Freq: Once | INTRAMUSCULAR | Status: AC
  Administered 2015-08-29: 1000 ug via INTRAMUSCULAR
  Filled 2015-08-29: qty 1

## 2015-08-30 ENCOUNTER — Inpatient Hospital Stay: Payer: Medicaid Other

## 2015-09-27 ENCOUNTER — Inpatient Hospital Stay: Payer: Medicaid Other | Attending: Internal Medicine

## 2015-09-27 VITALS — BP 94/54 | HR 70 | Temp 97.1°F | Resp 16

## 2015-09-27 DIAGNOSIS — Z79899 Other long term (current) drug therapy: Secondary | ICD-10-CM | POA: Insufficient documentation

## 2015-09-27 DIAGNOSIS — E538 Deficiency of other specified B group vitamins: Secondary | ICD-10-CM | POA: Diagnosis present

## 2015-09-27 MED ORDER — CYANOCOBALAMIN 1000 MCG/ML IJ SOLN
1000.0000 ug | Freq: Once | INTRAMUSCULAR | Status: AC
  Administered 2015-09-27: 1000 ug via INTRAMUSCULAR
  Filled 2015-09-27: qty 1

## 2015-10-12 DIAGNOSIS — N859 Noninflammatory disorder of uterus, unspecified: Secondary | ICD-10-CM | POA: Insufficient documentation

## 2015-10-20 ENCOUNTER — Emergency Department
Admission: EM | Admit: 2015-10-20 | Discharge: 2015-10-21 | Disposition: A | Discharge status: Home / Self Care | Payer: Medicaid Other | Attending: Emergency Medicine | Admitting: Emergency Medicine

## 2015-10-20 ENCOUNTER — Encounter: Payer: Self-pay | Admitting: Emergency Medicine

## 2015-10-20 DIAGNOSIS — Z87891 Personal history of nicotine dependence: Secondary | ICD-10-CM | POA: Insufficient documentation

## 2015-10-20 DIAGNOSIS — R109 Unspecified abdominal pain: Secondary | ICD-10-CM | POA: Diagnosis present

## 2015-10-20 DIAGNOSIS — R197 Diarrhea, unspecified: Secondary | ICD-10-CM | POA: Diagnosis not present

## 2015-10-20 DIAGNOSIS — Z8541 Personal history of malignant neoplasm of cervix uteri: Secondary | ICD-10-CM | POA: Diagnosis not present

## 2015-10-20 DIAGNOSIS — R110 Nausea: Secondary | ICD-10-CM | POA: Diagnosis not present

## 2015-10-20 LAB — COMPREHENSIVE METABOLIC PANEL
ALBUMIN: 4.9 g/dL (ref 3.5–5.0)
ALT: 13 U/L — ABNORMAL LOW (ref 14–54)
ANION GAP: 6 (ref 5–15)
AST: 17 U/L (ref 15–41)
Alkaline Phosphatase: 35 U/L — ABNORMAL LOW (ref 38–126)
BILIRUBIN TOTAL: 1.1 mg/dL (ref 0.3–1.2)
BUN: 12 mg/dL (ref 6–20)
CO2: 26 mmol/L (ref 22–32)
Calcium: 9.7 mg/dL (ref 8.9–10.3)
Chloride: 103 mmol/L (ref 101–111)
Creatinine, Ser: 0.54 mg/dL (ref 0.44–1.00)
Glucose, Bld: 89 mg/dL (ref 65–99)
POTASSIUM: 3.7 mmol/L (ref 3.5–5.1)
Sodium: 135 mmol/L (ref 135–145)
TOTAL PROTEIN: 7.5 g/dL (ref 6.5–8.1)

## 2015-10-20 LAB — URINALYSIS COMPLETE WITH MICROSCOPIC (ARMC ONLY)
BACTERIA UA: NONE SEEN
Bilirubin Urine: NEGATIVE
Glucose, UA: NEGATIVE mg/dL
HGB URINE DIPSTICK: NEGATIVE
LEUKOCYTES UA: NEGATIVE
NITRITE: NEGATIVE
PH: 6 (ref 5.0–8.0)
Protein, ur: NEGATIVE mg/dL
SPECIFIC GRAVITY, URINE: 1.002 — AB (ref 1.005–1.030)

## 2015-10-20 LAB — CBC
HEMATOCRIT: 40.4 % (ref 35.0–47.0)
Hemoglobin: 13.8 g/dL (ref 12.0–16.0)
MCH: 30.5 pg (ref 26.0–34.0)
MCHC: 34.1 g/dL (ref 32.0–36.0)
MCV: 89.7 fL (ref 80.0–100.0)
Platelets: 210 10*3/uL (ref 150–440)
RBC: 4.51 MIL/uL (ref 3.80–5.20)
RDW: 13.2 % (ref 11.5–14.5)
WBC: 8.8 10*3/uL (ref 3.6–11.0)

## 2015-10-20 LAB — LIPASE, BLOOD: Lipase: 31 U/L (ref 11–51)

## 2015-10-20 MED ORDER — ONDANSETRON 4 MG PO TBDP
4.0000 mg | ORAL_TABLET | Freq: Once | ORAL | Status: AC | PRN
  Administered 2015-10-20: 4 mg via ORAL

## 2015-10-20 MED ORDER — ONDANSETRON 4 MG PO TBDP
ORAL_TABLET | ORAL | Status: AC
  Filled 2015-10-20: qty 1

## 2015-10-20 NOTE — ED Notes (Signed)
Pt ambulatory to triage with c/o of diarrhea and nausea since July 4.  Pt was dx with chlamydia rx'd doxy and penicillin  But has only been taking the doxy and believes that the s/sx are from the ABX.  Pt also c/o of sharp burning right flank pain (10/10), pt had UA and culture Friday.  Pt self caths, d/t to complications from a type on congenital spina bifida and has a hx of kidney stones.  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  Pt is on nitrofurantion  Prophylactic d/t to self cathing.   Pt denies HA and 1 x vomiting on thursday.

## 2015-10-21 ENCOUNTER — Emergency Department: Payer: Medicaid Other

## 2015-10-21 MED ORDER — ONDANSETRON HCL 4 MG/2ML IJ SOLN
4.0000 mg | Freq: Once | INTRAMUSCULAR | Status: AC
Start: 2015-10-21 — End: 2015-10-21
  Administered 2015-10-21: 4 mg via INTRAVENOUS
  Filled 2015-10-21: qty 2

## 2015-10-21 MED ORDER — HYDROCODONE-ACETAMINOPHEN 5-325 MG PO TABS: 1.0000 | ORAL_TABLET | Freq: Four times a day (QID) | ORAL | Status: DC | PRN

## 2015-10-21 MED ORDER — SODIUM CHLORIDE 0.9 % IV BOLUS (SEPSIS)
1000.0000 mL | Freq: Once | INTRAVENOUS | Status: AC
  Administered 2015-10-21: 1000 mL via INTRAVENOUS

## 2015-10-21 MED ORDER — DIPHENHYDRAMINE HCL 50 MG/ML IJ SOLN
12.5000 mg | Freq: Once | INTRAMUSCULAR | Status: AC
  Administered 2015-10-21: 12.5 mg via INTRAVENOUS

## 2015-10-21 MED ORDER — MORPHINE SULFATE (PF) 2 MG/ML IV SOLN
2.0000 mg | Freq: Once | INTRAVENOUS | Status: AC
  Administered 2015-10-21: 2 mg via INTRAVENOUS
  Filled 2015-10-21: qty 1

## 2015-10-21 MED ORDER — DIPHENHYDRAMINE HCL 50 MG/ML IJ SOLN
INTRAMUSCULAR | Status: AC
  Administered 2015-10-21: 12.5 mg via INTRAVENOUS
  Filled 2015-10-21: qty 1

## 2015-10-21 MED ORDER — ONDANSETRON 4 MG PO TBDP: 4.0000 mg | ORAL_TABLET | Freq: Three times a day (TID) | ORAL | Status: DC | PRN

## 2015-10-21 MED ORDER — HYDROCODONE-ACETAMINOPHEN 5-325 MG PO TABS
1.0000 | ORAL_TABLET | Freq: Once | ORAL | Status: AC
  Administered 2015-10-21: 1 via ORAL

## 2015-10-21 MED ORDER — HYDROCODONE-ACETAMINOPHEN 5-325 MG PO TABS
ORAL_TABLET | ORAL | Status: AC
  Administered 2015-10-21: 1 via ORAL
  Filled 2015-10-21: qty 1

## 2015-10-21 MED ORDER — DEXTROSE 5 % IV SOLN
1.0000 g | INTRAVENOUS | Status: DC
  Administered 2015-10-21: 1 g via INTRAVENOUS
  Filled 2015-10-21: qty 10

## 2015-10-21 NOTE — ED Notes (Signed)
MD Sung at bedside. 

## 2015-10-21 NOTE — ED Provider Notes (Signed)
[facility] Emergency Department Provider Note   ____________________________________________  Time seen: Approximately 12:40 AM  I have reviewed the triage vital signs and the nursing notes.   HISTORY  Chief Complaint Diarrhea; Flank Pain; and Nausea    HPI Melissa Pruitt is a 40 y.o. female who presents to the ED from home with a chief complaint of diarrhea, nausea and right flank pain. Patient is currently being treated with doxycycline and penicillin for chlamydia and strep B UTI. She is only currently taking the doxycycline and believes that her symptoms of nausea and diarrhea are secondary to doxycycline. Complains of sharp burning right flank pain which is nonradiating. Patient self catheters secondary to spina bifida. Denies history of kidney stones. Denies associated fever, chills, chest pain, shortness of breath, abdominal pain, vomiting. Denies recent travel or trauma. Nothing makes her symptoms better or worse.   Past Medical History  Diagnosis Date  . 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 Cervical cancer [facility])     removed in July 2009  . Normocytic anemia 08/21/2014  . Vitamin B 12 deficiency 08/21/2014    Patient Active Problem List   Diagnosis Date Noted  . Normocytic anemia 08/21/2014  . Vitamin B 12 deficiency 08/21/2014  . Cervical pain 08/25/2012  . Pain in joint, pelvic region and thigh 07/16/2012  . Tethered spinal cord ([place]) 07/16/2012  . Balance disorder 07/16/2012  . Personal history of fall 07/16/2012  . 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([place]) 12/23/2010  . 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 [place] SP CORD&NERV SYSTEM 05/08/2010  . H N P-LUMBAR 04/23/2010  . BACK PAIN 01/28/2010    Past Surgical History  Procedure Laterality Date  . Back surgery    . Bladder surgery    . Colostomy      after flex sig as 40 year old.   . Tubal  ligation    . Removal of cervical cancer    . Colostomy reversal    . Exploratory laparotomy      with colostomy placement    Current Outpatient Rx  Name  Route  Sig  Dispense  Refill  . diclofenac sodium (VOLTAREN) 1 % GEL   Topical   Apply 1 application topically 4 (four) times daily as needed. arthritis         . fluticasone (FLONASE) 50 MCG/ACT nasal spray   Nasal   Place 2 sprays into the nose daily.         Marland Kitchen HYDROcodone-acetaminophen (NORCO) 5-325 MG tablet   Oral   Take 1 tablet by mouth every 6 (six) hours as needed for moderate pain.   15 tablet   0   . levonorgestrel-ethinyl estradiol (SEASONALE,INTROVALE,JOLESSA) 0.15-0.03 MG tablet   Oral   Take 1 tablet by mouth daily.         Marland Kitchen lidocaine (LIDODERM) 5 %   Transdermal   Place 1 patch onto the skin daily. Remove & Discard patch within 12 hours or as directed by MD          . loratadine (CLARITIN) 10 MG tablet   Oral   Take 10 mg by mouth daily.           . methocarbamol (ROBAXIN) 750 MG tablet   Oral   Take 750 mg by mouth 3 (three) times daily.         . montelukast (SINGULAIR) 10 MG tablet  Oral   Take 10 mg by mouth at bedtime.         . nitrofurantoin, macrocrystal-monohydrate, (MACROBID) 100 MG capsule   Oral   Take 100 mg by mouth at bedtime.         . ondansetron (ZOFRAN ODT) 4 MG disintegrating tablet   Oral   Take 1 tablet (4 mg total) by mouth every 8 (eight) hours as needed for nausea or vomiting.   15 tablet   0   . ondansetron (ZOFRAN) 4 MG tablet   Oral   Take 1 tablet (4 mg total) by mouth daily as needed for nausea or vomiting.   20 tablet   1   . vitamin B-12 1000 MCG tablet   Oral   Take 1 tablet (1,000 mcg total) by mouth daily.   30 tablet   5     Allergies Ciprofloxacin; Azithromycin; Dilaudid; Morphine and related; Sulfa antibiotics; and Tramadol hcl  Family History  Problem Relation Age of Onset  . Colon cancer Neg Hx   . Breast cancer Neg Hx      Social History Social History  Substance Use Topics  . Smoking status: Former Smoker    Types: Cigarettes  . Smokeless tobacco: None     Comment: 1-3 cigarettes  . Alcohol Use: Yes     Comment: a beer here and there    Review of Systems  Constitutional: No fever/chills. Eyes: No visual changes. ENT: No sore throat. Cardiovascular: Denies chest pain. Respiratory: Denies shortness of breath. Gastrointestinal: No abdominal pain.  Positive for nausea, no vomiting.  Positive for diarrhea.  No constipation. Genitourinary: Negative for dysuria. Musculoskeletal: Positive for back pain. Skin: Negative for rash. Neurological: Negative for headaches, focal weakness or numbness.  10-point ROS otherwise negative.  ____________________________________________   PHYSICAL EXAM:  VITAL SIGNS: ED Triage Vitals  Enc Vitals Group     BP 10/20/15 2143 118/81 mmHg     Pulse Rate 10/20/15 2143 81     Resp 10/20/15 2143 20     Temp 10/20/15 2143 98.1 F (36.7 C)     Temp Source 10/20/15 2143 Oral     SpO2 10/20/15 2143 100 %     Weight 10/20/15 2143 115 lb (52.164 kg)     Height 10/20/15 2143 '5\' 4"'  (1.626 m)     Head Cir --      Peak Flow --      Pain Score 10/20/15 2144 10     Pain Loc --      Pain Edu? --      Excl. in [place]? --     Constitutional: Alert and oriented. Well appearing and in no acute distress. Eyes: Conjunctivae are normal. PERRL. EOMI. Head: Atraumatic. Nose: No congestion/rhinnorhea. Mouth/Throat: Mucous membranes are moist.  Oropharynx non-erythematous. Neck: No stridor.   Cardiovascular: Normal rate, regular rhythm. Grossly normal heart sounds.  Good peripheral circulation. Respiratory: Normal respiratory effort.  No retractions. Lungs CTAB. Gastrointestinal: Soft and nontender. No distention. No abdominal bruits. No CVA tenderness. Musculoskeletal: No lower extremity tenderness nor edema.  No joint effusions. Neurologic:  Normal speech and language. No  gross focal neurologic deficits are appreciated. No gait instability. Skin:  Skin is warm, dry and intact. No rash noted. Psychiatric: Mood and affect are normal. Speech and behavior are normal.  ____________________________________________   LABS (all labs ordered are listed, but only abnormal results are displayed)  Labs Reviewed  COMPREHENSIVE METABOLIC PANEL - Abnormal; Notable for the following:  ALT 13 (*)    Alkaline Phosphatase 35 (*)    All other components within normal limits  URINALYSIS COMPLETEWITH MICROSCOPIC ([facility] ONLY) - Abnormal; Notable for the following:    Color, Urine COLORLESS (*)    APPearance CLEAR (*)    Ketones, ur TRACE (*)    Specific Gravity, Urine 1.002 (*)    Squamous Epithelial / LPF 0-5 (*)    All other components within normal limits  LIPASE, BLOOD  CBC   ____________________________________________  EKG  None ____________________________________________  RADIOLOGY  CT renal stone study interpreted per Dr. Radene Knee: Unremarkable noncontrast CT of the abdomen and pelvis. ____________________________________________   PROCEDURES  Procedure(s) performed: None  Procedures  Critical Care performed: No  ____________________________________________   INITIAL IMPRESSION / ASSESSMENT AND PLAN / ED COURSE  Pertinent labs & imaging results that were available during my care of the patient were reviewed by me and considered in my medical decision making (see chart for details).  40 year old female who presents with nausea and diarrhea after starting doxycycline. She is requesting IV dose of penicillin to treat her strep B urine. She is hesitant to fill and take her prescription for penicillin since she is having nausea and diarrhea from the doxycycline. Will administer IV antibiotics, analgesia, IV fluid resuscitation and obtain CT renal colic study.  ----------------------------------------- 3:50 AM on  10/21/2015 -----------------------------------------  Patient improved. Updated patient of CT imaging results. Encourage her to continue to finish doxycycline and to follow up with her doctor next week to recheck urinalysis to see if she still requires penicillin. Patient was unable to produce stool specimen for analysis while in the ED. She will be sent home with a stool collection kit and instructed to return specimen to outpatient laboratory. Strict return precautions given. Patient verbalizes understanding and agrees with plan of care. ____________________________________________   FINAL CLINICAL IMPRESSION(S) / ED DIAGNOSES  Final diagnoses:  Flank pain  Nausea  Diarrhea, unspecified type      NEW MEDICATIONS STARTED DURING THIS VISIT:  New Prescriptions   HYDROCODONE-ACETAMINOPHEN (NORCO) 5-325 MG TABLET    Take 1 tablet by mouth every 6 (six) hours as needed for moderate pain.   ONDANSETRON (ZOFRAN ODT) 4 MG DISINTEGRATING TABLET    Take 1 tablet (4 mg total) by mouth every 8 (eight) hours as needed for nausea or vomiting.     Note:  This document was prepared using Dragon voice recognition software and may include unintentional dictation errors.      Paulette Blanch, MD 10/21/15 [phone]

## 2015-10-21 NOTE — ED Notes (Signed)
  Reviewed d/c instructions, follow-up care, and prescriptions with pt. Pt verbalized understanding 

## 2015-10-21 NOTE — Discharge Instructions (Signed)
1. Although your symptoms of nausea and diarrhea are likely related to doxycycline use, it is important that you finish your course of doxycycline. 2. Please follow up with your doctor next week to retest your urine to see if you still need a penicillin prescription. 3. Return stool sample to outpatient laboratory when convenient. 4. He may take pain and nausea medicines as needed (Norco/Zofran #15). 5. Return to the ER for worsening symptoms, persistent vomiting, difficulty breathing or other concerns.  Flank Pain Flank pain refers to pain that is located on the side of the body between the upper abdomen and the back. The pain may occur over a short period of time (acute) or may be long-term or reoccurring (chronic). It may be mild or severe. Flank pain can be caused by many things. CAUSES  Some of the more common causes of flank pain include:  Muscle strains.   Muscle spasms.   A disease of your spine (vertebral disk disease).   A lung infection (pneumonia).   Fluid around your lungs (pulmonary edema).   A kidney infection.   Kidney stones.   A very painful skin rash caused by the chickenpox virus (shingles).   Gallbladder disease.  Russell care will depend on the cause of your pain. In general,  Rest as directed by your caregiver.  Drink enough fluids to keep your urine clear or pale yellow.  Only take over-the-counter or prescription medicines as directed by your caregiver. Some medicines may help relieve the pain.  Tell your caregiver about any changes in your pain.  Follow up with your caregiver as directed. SEEK IMMEDIATE MEDICAL CARE IF:   Your pain is not controlled with medicine.   You have new or worsening symptoms.  Your pain increases.   You have abdominal pain.   You have shortness of breath.   You have persistent nausea or vomiting.   You have swelling in your abdomen.   You feel faint or pass out.   You  have blood in your urine.  You have a fever or persistent symptoms for more than 2-3 days.  You have a fever and your symptoms suddenly get worse. MAKE SURE YOU:   Understand these instructions.  Will watch your condition.  Will get help right away if you are not doing well or get worse.   This information is not intended to replace advice given to you by your health care provider. Make sure you discuss any questions you have with your health care provider.   Document Released: 05/22/2005 Document Revised: 12/24/2011 Document Reviewed: 11/13/2011 Elsevier Interactive Patient Education 2016 [place].  Diarrhea Diarrhea is frequent loose and watery bowel movements. It can cause you to feel weak and dehydrated. Dehydration can cause you to become tired and thirsty, have a dry mouth, and have decreased urination that often is dark yellow. Diarrhea is a sign of another problem, most often an infection that will not last long. In most cases, diarrhea typically lasts 2-3 days. However, it can last longer if it is a sign of something more serious. It is important to treat your diarrhea as directed by your caregiver to lessen or prevent future episodes of diarrhea. CAUSES  Some common causes include:  Gastrointestinal infections caused by viruses, bacteria, or parasites.  Food poisoning or food allergies.  Certain medicines, such as antibiotics, chemotherapy, and laxatives.  Artificial sweeteners and fructose.  Digestive disorders. HOME CARE INSTRUCTIONS  Ensure adequate fluid intake (hydration): Have  1 cup (8 oz) of fluid for each diarrhea episode. Avoid fluids that contain simple sugars or sports drinks, fruit juices, whole milk products, and sodas. Your urine should be clear or pale yellow if you are drinking enough fluids. Hydrate with an oral rehydration solution that you can purchase at pharmacies, retail stores, and online. You can prepare an oral rehydration solution at home by  mixing the following ingredients together:   - tsp table salt.   tsp baking soda.   tsp salt substitute containing potassium chloride.  1  tablespoons sugar.  1 L (34 oz) of water.  Certain foods and beverages may increase the speed at which food moves through the gastrointestinal (GI) tract. These foods and beverages should be avoided and include:  Caffeinated and alcoholic beverages.  High-fiber foods, such as raw fruits and vegetables, nuts, seeds, and whole grain breads and cereals.  Foods and beverages sweetened with sugar alcohols, such as xylitol, sorbitol, and mannitol.  Some foods may be well tolerated and may help thicken stool including:  Starchy foods, such as rice, toast, pasta, low-sugar cereal, oatmeal, grits, baked potatoes, crackers, and bagels.  Bananas.  Applesauce.  Add probiotic-rich foods to help increase healthy bacteria in the GI tract, such as yogurt and fermented milk products.  Wash your hands well after each diarrhea episode.  Only take over-the-counter or prescription medicines as directed by your caregiver.  Take a warm bath to relieve any burning or pain from frequent diarrhea episodes. SEEK IMMEDIATE MEDICAL CARE IF:   You are unable to keep fluids down.  You have persistent vomiting.  You have blood in your stool, or your stools are black and tarry.  You do not urinate in 6-8 hours, or there is only a small amount of very dark urine.  You have abdominal pain that increases or localizes.  You have weakness, dizziness, confusion, or light-headedness.  You have a severe headache.  Your diarrhea gets worse or does not get better.  You have a fever or persistent symptoms for more than 2-3 days.  You have a fever and your symptoms suddenly get worse. MAKE SURE YOU:   Understand these instructions.  Will watch your condition.  Will get help right away if you are not doing well or get worse.   This information is not intended  to replace advice given to you by your health care provider. Make sure you discuss any questions you have with your health care provider.   Document Released: 03/21/2002 Document Revised: 04/21/2014 Document Reviewed: 12/07/2011 Elsevier Interactive Patient Education 2016 [employer]  Nausea, Adult Nausea is the feeling that you have an upset stomach or have to vomit. Nausea by itself is not likely a serious concern, but it may be an early sign of more serious medical problems. As nausea gets worse, it can lead to vomiting. If vomiting develops, there is the risk of dehydration.  CAUSES   Viral infections.  Food poisoning.  Medicines.  Pregnancy.  Motion sickness.  Migraine headaches.  Emotional distress.  Severe pain from any source.  Alcohol intoxication. HOME CARE INSTRUCTIONS  Get plenty of rest.  Ask your caregiver about specific rehydration instructions.  Eat small amounts of food and sip liquids more often.  Take all medicines as told by your caregiver. SEEK MEDICAL CARE IF:  You have not improved after 2 days, or you get worse.  You have a headache. SEEK IMMEDIATE MEDICAL CARE IF:   You have a fever.  You  faint.  You keep vomiting or have blood in your vomit.  You are extremely weak or dehydrated.  You have dark or bloody stools.  You have severe chest or abdominal pain. MAKE SURE YOU:  Understand these instructions.  Will watch your condition.  Will get help right away if you are not doing well or get worse.   This information is not intended to replace advice given to you by your health care provider. Make sure you discuss any questions you have with your health care provider.   Document Released: 05/08/2004 Document Revised: 04/21/2014 Document Reviewed: 12/11/2010 Elsevier Interactive Patient Education [employer].

## 2015-11-01 ENCOUNTER — Inpatient Hospital Stay: Payer: Medicaid Other | Attending: Internal Medicine

## 2015-11-01 ENCOUNTER — Other Ambulatory Visit
Admission: RE | Admit: 2015-11-01 | Discharge: 2015-11-01 | Disposition: A | Discharge status: Home / Self Care | Payer: Medicaid Other | Source: Ambulatory Visit | Attending: Emergency Medicine | Admitting: Emergency Medicine

## 2015-11-01 VITALS — BP 98/65 | HR 64 | Temp 96.7°F | Resp 16

## 2015-11-01 DIAGNOSIS — Z79899 Other long term (current) drug therapy: Secondary | ICD-10-CM | POA: Diagnosis not present

## 2015-11-01 DIAGNOSIS — R197 Diarrhea, unspecified: Secondary | ICD-10-CM | POA: Diagnosis not present

## 2015-11-01 DIAGNOSIS — E538 Deficiency of other specified B group vitamins: Secondary | ICD-10-CM

## 2015-11-01 LAB — C DIFFICILE QUICK SCREEN W PCR REFLEX
C DIFFICILE (CDIFF) INTERP: NOT DETECTED
C Diff antigen: NEGATIVE
C Diff toxin: NEGATIVE

## 2015-11-01 LAB — GASTROINTESTINAL PANEL BY PCR, STOOL (REPLACES STOOL CULTURE)
ADENOVIRUS F40/41: NOT DETECTED
Astrovirus: NOT DETECTED
CAMPYLOBACTER SPECIES: NOT DETECTED
Cryptosporidium: NOT DETECTED
Cyclospora cayetanensis: NOT DETECTED
E. coli O157: NOT DETECTED
ENTEROAGGREGATIVE E COLI (EAEC): NOT DETECTED
ENTEROPATHOGENIC E COLI (EPEC): NOT DETECTED
Entamoeba histolytica: NOT DETECTED
Enterotoxigenic E coli (ETEC): NOT DETECTED
GIARDIA LAMBLIA: NOT DETECTED
NOROVIRUS GI/GII: NOT DETECTED
PLESIMONAS SHIGELLOIDES: NOT DETECTED
ROTAVIRUS A: NOT DETECTED
SALMONELLA SPECIES: NOT DETECTED
SHIGA LIKE TOXIN PRODUCING E COLI (STEC): NOT DETECTED
SHIGELLA/ENTEROINVASIVE E COLI (EIEC): NOT DETECTED
Sapovirus (I, II, IV, and V): NOT DETECTED
Vibrio cholerae: NOT DETECTED
Vibrio species: NOT DETECTED
Yersinia enterocolitica: NOT DETECTED

## 2015-11-01 MED ORDER — CYANOCOBALAMIN 1000 MCG/ML IJ SOLN
1000.0000 ug | Freq: Once | INTRAMUSCULAR | Status: AC
  Administered 2015-11-01: 1000 ug via INTRAMUSCULAR
  Filled 2015-11-01: qty 1

## 2015-11-29 ENCOUNTER — Inpatient Hospital Stay: Payer: Medicaid Other | Attending: Internal Medicine

## 2015-11-29 VITALS — BP 95/60 | HR 60 | Temp 97.0°F | Resp 17

## 2015-11-29 DIAGNOSIS — Z8541 Personal history of malignant neoplasm of cervix uteri: Secondary | ICD-10-CM | POA: Diagnosis not present

## 2015-11-29 DIAGNOSIS — N329 Bladder disorder, unspecified: Secondary | ICD-10-CM | POA: Insufficient documentation

## 2015-11-29 DIAGNOSIS — D1779 Benign lipomatous neoplasm of other sites: Secondary | ICD-10-CM | POA: Diagnosis not present

## 2015-11-29 DIAGNOSIS — Z79899 Other long term (current) drug therapy: Secondary | ICD-10-CM | POA: Diagnosis not present

## 2015-11-29 DIAGNOSIS — R5383 Other fatigue: Secondary | ICD-10-CM | POA: Insufficient documentation

## 2015-11-29 DIAGNOSIS — Z87891 Personal history of nicotine dependence: Secondary | ICD-10-CM | POA: Diagnosis not present

## 2015-11-29 DIAGNOSIS — Q068 Other specified congenital malformations of spinal cord: Secondary | ICD-10-CM | POA: Diagnosis not present

## 2015-11-29 DIAGNOSIS — D649 Anemia, unspecified: Secondary | ICD-10-CM | POA: Diagnosis not present

## 2015-11-29 DIAGNOSIS — M549 Dorsalgia, unspecified: Secondary | ICD-10-CM | POA: Diagnosis not present

## 2015-11-29 DIAGNOSIS — E538 Deficiency of other specified B group vitamins: Secondary | ICD-10-CM | POA: Diagnosis not present

## 2015-11-29 DIAGNOSIS — Q057 Lumbar spina bifida without hydrocephalus: Secondary | ICD-10-CM | POA: Diagnosis not present

## 2015-11-29 MED ORDER — CYANOCOBALAMIN 1000 MCG/ML IJ SOLN
1000.0000 ug | Freq: Once | INTRAMUSCULAR | Status: AC
  Administered 2015-11-29: 1000 ug via INTRAMUSCULAR

## 2015-12-04 ENCOUNTER — Inpatient Hospital Stay: Payer: Medicaid Other | Admitting: Internal Medicine

## 2015-12-10 ENCOUNTER — Other Ambulatory Visit: Payer: Self-pay | Admitting: *Deleted

## 2015-12-10 DIAGNOSIS — E538 Deficiency of other specified B group vitamins: Secondary | ICD-10-CM

## 2015-12-11 ENCOUNTER — Inpatient Hospital Stay (HOSPITAL_BASED_OUTPATIENT_CLINIC_OR_DEPARTMENT_OTHER): Payer: Medicaid Other | Admitting: Internal Medicine

## 2015-12-11 VITALS — BP 108/73 | HR 69 | Temp 97.6°F | Resp 18 | Wt 112.7 lb

## 2015-12-11 DIAGNOSIS — Z87891 Personal history of nicotine dependence: Secondary | ICD-10-CM

## 2015-12-11 DIAGNOSIS — Q057 Lumbar spina bifida without hydrocephalus: Secondary | ICD-10-CM

## 2015-12-11 DIAGNOSIS — D649 Anemia, unspecified: Secondary | ICD-10-CM

## 2015-12-11 DIAGNOSIS — Z79899 Other long term (current) drug therapy: Secondary | ICD-10-CM | POA: Diagnosis not present

## 2015-12-11 DIAGNOSIS — Q068 Other specified congenital malformations of spinal cord: Secondary | ICD-10-CM

## 2015-12-11 DIAGNOSIS — M549 Dorsalgia, unspecified: Secondary | ICD-10-CM

## 2015-12-11 DIAGNOSIS — R5383 Other fatigue: Secondary | ICD-10-CM

## 2015-12-11 DIAGNOSIS — E538 Deficiency of other specified B group vitamins: Secondary | ICD-10-CM

## 2015-12-11 DIAGNOSIS — D1779 Benign lipomatous neoplasm of other sites: Secondary | ICD-10-CM

## 2015-12-11 DIAGNOSIS — Z8541 Personal history of malignant neoplasm of cervix uteri: Secondary | ICD-10-CM

## 2015-12-11 DIAGNOSIS — N329 Bladder disorder, unspecified: Secondary | ICD-10-CM

## 2015-12-11 NOTE — Progress Notes (Signed)
[place] OFFICE PROGRESS NOTE  Patient Care Team: [facility] as PCP - General  No matching staging information was found for the patient.    No history exists.    # B12 deficiency [Dr.Pandit] ; Normocytic anemia, recent progressive fatigability. Diagnosed with B12 deficiency on 04/27/14 (despite taking oral B12). Prior labs done on 03/16/14 reported B-12 level at 212 with reference range 211-911, hemoglobin 12.7, MCV 92, WBC 5500, platelets 329. In August 2015, Cr 0.7, LFT unremarkable, Hb is 11.9; Workup done on 04/27/14 - Serum B12 low at 175, methylmalonic acid level normal at 376, iron study and folate normal, intrinsic factor antibody negative at 1.1 (reference range 0.0 to 1.1), hemoglobin 12.7, platelets 255, WBC 8400 with unremarkable differential. Patient has been started on parenteral B12 therapy on 05/01/14.   # Left back pain/ spinal bifida/ Left leg weakness/ teethered spinal cord; bladder spasticity/ on st cath.   This is my first interaction with the patient.  I reviewed the patient's prior charts/pertinent labs/imaging in detail; findings are summarized above.     INTERVAL HISTORY:  Melissa Pruitt 40 y.o.  female pleasant patient above history of B12 deficiency unclear etiology is here for B-12 shot.  Patient states that she does not respond to by mouth B12. Denies any unusual fatigue. Patient has chronic back pain/chronic tingling and numbness of the right lower extremity secondary to neurologic problems this is not any new.  REVIEW OF SYSTEMS:  A complete 10 point review of system is done which is negative except mentioned above/history of present illness.   PAST MEDICAL HISTORY :  Past Medical History:  Diagnosis Date  . Cervical cancer [facility])    removed in July 2009  . 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 Normocytic anemia 08/21/2014  . Vitamin B 12 deficiency 08/21/2014    PAST SURGICAL HISTORY :    Past Surgical History:  Procedure Laterality Date  . BACK SURGERY    . BLADDER SURGERY    . COLOSTOMY     after flex sig as 40 year old.   . colostomy reversal    . EXPLORATORY LAPAROTOMY     with colostomy placement  . removal of cervical cancer    . TUBAL LIGATION      FAMILY HISTORY :   Family History  Problem Relation Age of Onset  . Colon cancer Neg Hx   . Breast cancer Neg Hx     SOCIAL HISTORY:   Social History  Substance Use Topics  . Smoking status: Former Smoker    Types: Cigarettes  . Smokeless tobacco: Not on file     Comment: 1-3 cigarettes  . Alcohol use Yes     Comment: a beer here and there    ALLERGIES:  is allergic to ciprofloxacin; azithromycin; dilaudid [hydromorphone hcl]; lactose; morphine and related; sulfa antibiotics; tramadol hcl; buprenorphine hcl; oxcarbazepine; and pork-derived products.  MEDICATIONS:  Current Outpatient Prescriptions  Medication Sig Dispense Refill  . diclofenac sodium (VOLTAREN) 1 % GEL Apply 1 application topically 4 (four) times daily as needed. arthritis    . fluticasone (FLONASE) 50 MCG/ACT nasal spray Place 2 sprays into the nose daily.    Marland Kitchen HYDROcodone-acetaminophen (NORCO) 5-325 MG tablet Take 1 tablet by mouth every 6 (six) hours as needed for moderate pain. 15 tablet 0  . levonorgestrel-ethinyl estradiol (SEASONALE,INTROVALE,JOLESSA) 0.15-0.03 MG tablet Take 1 tablet by mouth daily.    Marland Kitchen lidocaine (LIDODERM)  5 % Place 1 patch onto the skin daily. Remove & Discard patch within 12 hours or as directed by MD     . loratadine (CLARITIN) 10 MG tablet Take 10 mg by mouth daily.      . methocarbamol (ROBAXIN) 750 MG tablet Take 750 mg by mouth 3 (three) times daily.    . montelukast (SINGULAIR) 10 MG tablet Take 10 mg by mouth at bedtime.    . nitrofurantoin, macrocrystal-monohydrate, (MACROBID) 100 MG capsule Take 100 mg by mouth at bedtime.    . norethindrone (MICRONOR,CAMILA,ERRIN) 0.35 MG tablet Take 1 tablet by  mouth daily.    . ondansetron (ZOFRAN) 4 MG tablet Take 1 tablet (4 mg total) by mouth daily as needed for nausea or vomiting. 20 tablet 1  . vitamin B-12 (CYANOCOBALAMIN) 1000 MCG tablet Inject as directed every thirty (30) days.     No current facility-administered medications for this visit.     PHYSICAL EXAMINATION:   BP 108/73 (BP Location: Left Arm, Patient Position: Sitting)   Pulse 69   Temp 97.6 F (36.4 C) (Tympanic)   Resp 18   Wt 112 lb 10.5 oz (51.1 kg)   BMI 19.34 kg/m   Filed Weights   12/11/15 1003  Weight: 112 lb 10.5 oz (51.1 kg)    GENERAL: Well-nourished well-developed; Alert, no distress and comfortable.   Alone.  EYES: no pallor or icterus OROPHARYNX: no thrush or ulceration; good dentition  NECK: supple, no masses felt LYMPH:  no palpable lymphadenopathy in the cervical, axillary or inguinal regions LUNGS: clear to auscultation and  No wheeze or crackles HEART/CVS: regular rate & rhythm and no murmurs; No lower extremity edema; right lower extremity brace/ ABDOMEN:abdomen soft, non-tender and normal bowel sounds Musculoskeletal:no cyanosis of digits and no clubbing  PSYCH: alert & oriented x 3 with fluent speech NEURO: no focal motor/sensory deficits; chronic weakness of the right lower extremity. SKIN:  no rashes or significant lesions  LABORATORY DATA:  I have reviewed the data as listed    Component Value Date/Time   NA 135 10/20/2015 2142   NA 142 02/09/2014 1437   K 3.7 10/20/2015 2142   K 3.5 02/09/2014 1437   CL 103 10/20/2015 2142   CL 105 02/09/2014 1437   CO2 26 10/20/2015 2142   CO2 27 02/09/2014 1437   GLUCOSE 89 10/20/2015 2142   GLUCOSE 130 (H) 02/09/2014 1437   BUN 12 10/20/2015 2142   BUN 8 02/09/2014 1437   CREATININE 0.54 10/20/2015 2142   CREATININE 0.62 02/09/2014 1437   CALCIUM 9.7 10/20/2015 2142   CALCIUM 8.3 (L) 02/09/2014 1437   PROT 7.5 10/20/2015 2142   PROT 6.6 02/09/2014 1437   ALBUMIN 4.9 10/20/2015 2142    ALBUMIN 3.4 02/09/2014 1437   AST 17 10/20/2015 2142   AST 9 (L) 02/09/2014 1437   ALT 13 (L) 10/20/2015 2142   ALT 15 02/09/2014 1437   ALKPHOS 35 (L) 10/20/2015 2142   ALKPHOS 30 (L) 02/09/2014 1437   BILITOT 1.1 10/20/2015 2142   BILITOT 0.3 02/09/2014 1437   GFRNONAA >60 10/20/2015 2142   GFRNONAA >60 02/09/2014 1437   GFRNONAA >60 04/01/2013 1007   GFRAA >60 10/20/2015 2142   GFRAA >60 02/09/2014 1437   GFRAA >60 04/01/2013 1007    No results found for: SPEP, UPEP  Lab Results  Component Value Date   WBC 8.8 10/20/2015   NEUTROABS 4.5 07/05/2015   HGB 13.8 10/20/2015   HCT 40.4 10/20/2015  MCV 89.7 10/20/2015   PLT 210 10/20/2015      Chemistry      Component Value Date/Time   NA 135 10/20/2015 2142   NA 142 02/09/2014 1437   K 3.7 10/20/2015 2142   K 3.5 02/09/2014 1437   CL 103 10/20/2015 2142   CL 105 02/09/2014 1437   CO2 26 10/20/2015 2142   CO2 27 02/09/2014 1437   BUN 12 10/20/2015 2142   BUN 8 02/09/2014 1437   CREATININE 0.54 10/20/2015 2142   CREATININE 0.62 02/09/2014 1437      Component Value Date/Time   CALCIUM 9.7 10/20/2015 2142   CALCIUM 8.3 (L) 02/09/2014 1437   ALKPHOS 35 (L) 10/20/2015 2142   ALKPHOS 30 (L) 02/09/2014 1437   AST 17 10/20/2015 2142   AST 9 (L) 02/09/2014 1437   ALT 13 (L) 10/20/2015 2142   ALT 15 02/09/2014 1437   BILITOT 1.1 10/20/2015 2142   BILITOT 0.3 02/09/2014 1437       RADIOGRAPHIC STUDIES: I have personally reviewed the radiological images as listed and agreed with the findings in the report. No results found.   ASSESSMENT & PLAN:  Vitamin B 12 deficiency B12 deficiency unclear etiology; on monthly injections IM. CBC CMP normal.  # Continue B12 injections on a monthly basis follow-up with M.D. labs one year.   Orders Placed This Encounter  Procedures  . CBC with Differential    Standing Status:   Future    Standing Expiration Date:   06/11/2017  . Basic metabolic panel    Standing Status:    Future    Standing Expiration Date:   06/11/2017   All questions were answered. The patient knows to call the clinic with any problems, questions or concerns.      Cammie Sickle, MD 12/11/2015 1:03 PM

## 2015-12-11 NOTE — Assessment & Plan Note (Signed)
B12 deficiency unclear etiology; on monthly injections IM. CBC CMP normal.  # Continue B12 injections on a monthly basis follow-up with M.D. labs one year. 

## 2015-12-25 ENCOUNTER — Telehealth: Payer: Self-pay | Admitting: Emergency Medicine

## 2015-12-25 NOTE — Telephone Encounter (Signed)
Patient called asking for stool culture results.  These are from July.  I gave her negative results.  [facility] does have [facility].

## 2016-01-03 ENCOUNTER — Inpatient Hospital Stay: Payer: Medicaid Other

## 2016-01-03 ENCOUNTER — Inpatient Hospital Stay: Payer: Medicaid Other | Attending: Internal Medicine

## 2016-01-03 VITALS — BP 98/63 | HR 61 | Temp 97.0°F | Resp 16

## 2016-01-03 DIAGNOSIS — E538 Deficiency of other specified B group vitamins: Secondary | ICD-10-CM | POA: Insufficient documentation

## 2016-01-03 DIAGNOSIS — Z79899 Other long term (current) drug therapy: Secondary | ICD-10-CM | POA: Insufficient documentation

## 2016-01-03 LAB — BASIC METABOLIC PANEL
ANION GAP: 7 (ref 5–15)
BUN: 14 mg/dL (ref 6–20)
CALCIUM: 9.1 mg/dL (ref 8.9–10.3)
CO2: 26 mmol/L (ref 22–32)
Chloride: 104 mmol/L (ref 101–111)
Creatinine, Ser: 0.6 mg/dL (ref 0.44–1.00)
Glucose, Bld: 96 mg/dL (ref 65–99)
POTASSIUM: 3.9 mmol/L (ref 3.5–5.1)
SODIUM: 137 mmol/L (ref 135–145)

## 2016-01-03 LAB — CBC WITH DIFFERENTIAL/PLATELET
BASOS ABS: 0 10*3/uL (ref 0–0.1)
Basophils Relative: 0 %
Eosinophils Absolute: 0.1 10*3/uL (ref 0–0.7)
Eosinophils Relative: 2 %
HEMATOCRIT: 39.4 % (ref 35.0–47.0)
Hemoglobin: 13.2 g/dL (ref 12.0–16.0)
LYMPHS ABS: 1.6 10*3/uL (ref 1.0–3.6)
LYMPHS PCT: 34 %
MCH: 30.2 pg (ref 26.0–34.0)
MCHC: 33.4 g/dL (ref 32.0–36.0)
MCV: 90.2 fL (ref 80.0–100.0)
MONO ABS: 0.4 10*3/uL (ref 0.2–0.9)
MONOS PCT: 8 %
NEUTROS ABS: 2.6 10*3/uL (ref 1.4–6.5)
Neutrophils Relative %: 56 %
Platelets: 198 10*3/uL (ref 150–440)
RBC: 4.37 MIL/uL (ref 3.80–5.20)
RDW: 13.1 % (ref 11.5–14.5)
WBC: 4.7 10*3/uL (ref 3.6–11.0)

## 2016-01-03 MED ORDER — CYANOCOBALAMIN 1000 MCG/ML IJ SOLN
1000.0000 ug | Freq: Once | INTRAMUSCULAR | Status: AC
  Administered 2016-01-03: 1000 ug via INTRAMUSCULAR

## 2016-01-04 LAB — IRON AND TIBC
IRON: 134 ug/dL (ref 28–170)
Saturation Ratios: 44 % — ABNORMAL HIGH (ref 10.4–31.8)
TIBC: 303 ug/dL (ref 250–450)
UIBC: 169 ug/dL

## 2016-01-04 LAB — FOLATE: Folate: 15.6 ng/mL (ref >5.9)

## 2016-01-04 LAB — FERRITIN: Ferritin: 28 ng/mL (ref 11–307)

## 2016-01-31 ENCOUNTER — Inpatient Hospital Stay: Payer: Medicaid Other | Attending: Internal Medicine

## 2016-01-31 VITALS — BP 100/68 | HR 62 | Temp 97.0°F | Resp 16

## 2016-01-31 DIAGNOSIS — E538 Deficiency of other specified B group vitamins: Secondary | ICD-10-CM

## 2016-01-31 DIAGNOSIS — Z79899 Other long term (current) drug therapy: Secondary | ICD-10-CM | POA: Diagnosis not present

## 2016-01-31 MED ORDER — CYANOCOBALAMIN 1000 MCG/ML IJ SOLN
1000.0000 ug | Freq: Once | INTRAMUSCULAR | Status: AC
Start: 2016-01-31 — End: 2016-01-31
  Administered 2016-01-31: 1000 ug via INTRAMUSCULAR
  Filled 2016-01-31: qty 1

## 2016-02-28 ENCOUNTER — Inpatient Hospital Stay: Payer: Medicaid Other | Attending: Internal Medicine

## 2016-02-28 VITALS — BP 90/60 | HR 65 | Temp 97.5°F | Resp 18

## 2016-02-28 DIAGNOSIS — E538 Deficiency of other specified B group vitamins: Secondary | ICD-10-CM | POA: Diagnosis not present

## 2016-02-28 DIAGNOSIS — Z79899 Other long term (current) drug therapy: Secondary | ICD-10-CM | POA: Diagnosis not present

## 2016-02-28 MED ORDER — CYANOCOBALAMIN 1000 MCG/ML IJ SOLN
1000.0000 ug | Freq: Once | INTRAMUSCULAR | Status: AC
  Administered 2016-02-28: 1000 ug via INTRAMUSCULAR

## 2016-04-03 ENCOUNTER — Inpatient Hospital Stay: Payer: Medicaid Other

## 2016-04-03 ENCOUNTER — Inpatient Hospital Stay: Payer: Medicaid Other | Attending: Internal Medicine

## 2016-04-03 DIAGNOSIS — E538 Deficiency of other specified B group vitamins: Secondary | ICD-10-CM | POA: Diagnosis not present

## 2016-04-03 DIAGNOSIS — Z79899 Other long term (current) drug therapy: Secondary | ICD-10-CM | POA: Diagnosis not present

## 2016-04-03 MED ORDER — CYANOCOBALAMIN 1000 MCG/ML IJ SOLN
1000.0000 ug | Freq: Once | INTRAMUSCULAR | Status: AC
  Administered 2016-04-03: 1000 ug via INTRAMUSCULAR
  Filled 2016-04-03: qty 1

## 2016-04-03 NOTE — Patient Instructions (Signed)
Cyanocobalamin, Vitamin B12 injection What is this medicine? CYANOCOBALAMIN (sye an oh koe BAL a min) is a man made form of vitamin B12. Vitamin B12 is used in the growth of healthy blood cells, nerve cells, and proteins in the body. It also helps with the metabolism of fats and carbohydrates. This medicine is used to treat people who can not absorb vitamin B12. This medicine may be used for other purposes; ask your health care provider or pharmacist if you have questions. COMMON BRAND NAME(S): B-12 Compliance Kit, B-12 Injection Kit, Cyomin, LA-12, Nutri-Twelve, Physicians EZ Use B-12, Primabalt What should I tell my health care provider before I take this medicine? They need to know if you have any of these conditions: -kidney disease -Leber's disease -megaloblastic anemia -an unusual or allergic reaction to cyanocobalamin, cobalt, other medicines, foods, dyes, or preservatives -pregnant or trying to get pregnant -breast-feeding How should I use this medicine? This medicine is injected into a muscle or deeply under the skin. It is usually given by a health care professional in a clinic or doctor's office. However, your doctor may teach you how to inject yourself. Follow all instructions. Talk to your pediatrician regarding the use of this medicine in children. Special care may be needed. Overdosage: If you think you have taken too much of this medicine contact a poison control center or emergency room at once. NOTE: This medicine is only for you. Do not share this medicine with others. What if I miss a dose? If you are given your dose at a clinic or doctor's office, call to reschedule your appointment. If you give your own injections and you miss a dose, take it as soon as you can. If it is almost time for your next dose, take only that dose. Do not take double or extra doses. What may interact with this medicine? -colchicine -heavy alcohol intake This list may not describe all possible  interactions. Give your health care provider a list of all the medicines, herbs, non-prescription drugs, or dietary supplements you use. Also tell them if you smoke, drink alcohol, or use illegal drugs. Some items may interact with your medicine. What should I watch for while using this medicine? Visit your doctor or health care professional regularly. You may need blood work done while you are taking this medicine. You may need to follow a special diet. Talk to your doctor. Limit your alcohol intake and avoid smoking to get the best benefit. What side effects may I notice from receiving this medicine? Side effects that you should report to your doctor or health care professional as soon as possible: -allergic reactions like skin rash, itching or hives, swelling of the face, lips, or tongue -blue tint to skin -chest tightness, pain -difficulty breathing, wheezing -dizziness -red, swollen painful area on the leg Side effects that usually do not require medical attention (report to your doctor or health care professional if they continue or are bothersome): -diarrhea -headache This list may not describe all possible side effects. Call your doctor for medical advice about side effects. You may report side effects to FDA at 1-800-FDA-1088. Where should I keep my medicine? Keep out of the reach of children. Store at room temperature between 15 and 30 degrees C (59 and 85 degrees F). Protect from light. Throw away any unused medicine after the expiration date. NOTE: This sheet is a summary. It may not cover all possible information. If you have questions about this medicine, talk to your doctor, pharmacist, or   health care provider.  2017 Elsevier/Gold Standard (2007-07-12 22:10:20)  

## 2016-04-04 ENCOUNTER — Inpatient Hospital Stay: Payer: Medicaid Other

## 2016-05-01 ENCOUNTER — Inpatient Hospital Stay: Payer: Medicaid Other | Attending: Internal Medicine

## 2016-05-01 DIAGNOSIS — Z79899 Other long term (current) drug therapy: Secondary | ICD-10-CM | POA: Insufficient documentation

## 2016-05-01 DIAGNOSIS — E538 Deficiency of other specified B group vitamins: Secondary | ICD-10-CM | POA: Insufficient documentation

## 2016-05-08 ENCOUNTER — Inpatient Hospital Stay: Payer: Medicaid Other

## 2016-05-08 VITALS — BP 109/68 | HR 71 | Temp 97.2°F | Resp 18

## 2016-05-08 DIAGNOSIS — E538 Deficiency of other specified B group vitamins: Secondary | ICD-10-CM | POA: Diagnosis present

## 2016-05-08 DIAGNOSIS — Z79899 Other long term (current) drug therapy: Secondary | ICD-10-CM | POA: Diagnosis not present

## 2016-05-08 MED ORDER — CYANOCOBALAMIN 1000 MCG/ML IJ SOLN
1000.0000 ug | Freq: Once | INTRAMUSCULAR | Status: AC
  Administered 2016-05-08: 1000 ug via INTRAMUSCULAR
  Filled 2016-05-08: qty 1

## 2016-05-27 ENCOUNTER — Ambulatory Visit (INDEPENDENT_AMBULATORY_CARE_PROVIDER_SITE_OTHER): Payer: Medicaid Other | Admitting: Obstetrics and Gynecology

## 2016-05-27 ENCOUNTER — Encounter: Payer: Self-pay | Admitting: Obstetrics and Gynecology

## 2016-05-27 VITALS — BP 91/57 | HR 83 | Ht 64.0 in | Wt 115.0 lb

## 2016-05-27 DIAGNOSIS — D259 Leiomyoma of uterus, unspecified: Secondary | ICD-10-CM | POA: Diagnosis not present

## 2016-05-27 DIAGNOSIS — Z113 Encounter for screening for infections with a predominantly sexual mode of transmission: Secondary | ICD-10-CM

## 2016-05-27 DIAGNOSIS — N810 Urethrocele: Secondary | ICD-10-CM | POA: Diagnosis not present

## 2016-05-27 DIAGNOSIS — N939 Abnormal uterine and vaginal bleeding, unspecified: Secondary | ICD-10-CM | POA: Diagnosis not present

## 2016-05-27 NOTE — Progress Notes (Signed)
Subjective:    Melissa Pruitt is a 41 y.o. female  With h/o spina bifida and neurogenic bladder who presents for second opinion on DUB with uterine fibroids. Periods are regular every 28-30 days, lasting 3-5 days. Dysmenorrhea:mild, occurring first 1-2 days of flow.  Patient initially complained about post-coital bleeding ~ 1 year ago.  Was seen by [facility] clinic, who recommended trial of doxycyline.  Patient states that bleeding initially improved.  Several months later, bleedng was noted to occur intermentstrually.  States that she has also been seen by [place] in the past, who recommended hysterectomy.  Patient did not want to proceed at that time.  Notes that over the past few months, her bleeding has normalized after she stopped taking her Micronor, and she has had no further problems.  Wonders if it is still advised for a hysterectomy.   Patient also desires to have STD testing performed today.  Notes that she has had some vaginal irritation and just "wants to be sure".   Current contraception: none History of abnormal Pap smear: yes, also with h/o LEEP procedure in the past.  Family history of uterine or ovarian cancer: no Regular self breast exam: yes History of abnormal mammogram: no Family history of breast cancer: no   Menstrual History: OB History  Gravida Para Term Preterm AB Living  3 3       3   SAB TAB Ectopic Multiple Live Births          3    # Outcome Date GA Lbr Len/2nd Weight Sex Delivery Anes PTL Lv  3 Para      Vag-Spont   LIV  2 Para      Vag-Spont   LIV  1 Para      Vag-Spont   LIV      Menarche age: 17 Patient's last menstrual period was 06/25/2015.      Past Medical History:  Diagnosis Date  . Abnormal Pap smear of cervix   . Cervical cancer [facility])    removed in July 2009  . 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 Normocytic anemia 08/21/2014  . Tethered spinal cord ([place])   . Vitamin B 12 deficiency  08/21/2014   Family History  Problem Relation Age of Onset  . Hypertension Mother   . Cervical cancer Maternal Grandmother   . Colon cancer Neg Hx   . Breast cancer Neg Hx     Past Surgical History:  Procedure Laterality Date  . BACK SURGERY    . BLADDER SURGERY    . COLOSTOMY     after flex sig as 41 year old.   . colostomy reversal    . EXPLORATORY LAPAROTOMY     with colostomy placement  . LEEP    . removal of cervical cancer    . TUBAL LIGATION      Social History   Social History  . Marital status: Legally Separated    Spouse name: N/A  . Number of children: 3  . Years of education: N/A   Occupational History  . disability Other   Social History Main Topics  . Smoking status: Former Smoker    Types: Cigarettes    Quit date: 07/14/2011  . Smokeless tobacco: Never Used     Comment: 1-3 cigarettes  . Alcohol use Yes     Comment: a beer here and there  . Drug use: No  . Sexual activity: Yes  Other Topics Concern  . Not on file   Social History Narrative  . No narrative on file    Current Outpatient Prescriptions on File Prior to Visit  Medication Sig Dispense Refill  . diclofenac sodium (VOLTAREN) 1 % GEL Apply 1 application topically 4 (four) times daily as needed. arthritis    . fluticasone (FLONASE) 50 MCG/ACT nasal spray Place 2 sprays into the nose daily.    Marland Kitchen HYDROcodone-acetaminophen (NORCO) 5-325 MG tablet Take 1 tablet by mouth every 6 (six) hours as needed for moderate pain. 15 tablet 0  . lidocaine (LIDODERM) 5 % Place 1 patch onto the skin daily. Remove & Discard patch within 12 hours or as directed by MD     . loratadine (CLARITIN) 10 MG tablet Take 10 mg by mouth daily.      . methocarbamol (ROBAXIN) 750 MG tablet Take 750 mg by mouth 3 (three) times daily.    . montelukast (SINGULAIR) 10 MG tablet Take 10 mg by mouth at bedtime.    . nitrofurantoin, macrocrystal-monohydrate, (MACROBID) 100 MG capsule Take 100 mg by mouth at bedtime.    .  norethindrone (MICRONOR,CAMILA,ERRIN) 0.35 MG tablet Take 1 tablet by mouth daily.    . ondansetron (ZOFRAN) 4 MG tablet Take 1 tablet (4 mg total) by mouth daily as needed for nausea or vomiting. 20 tablet 1  . vitamin B-12 (CYANOCOBALAMIN) 1000 MCG tablet Inject as directed every thirty (30) days.     No current facility-administered medications on file prior to visit.     Allergies  Allergen Reactions  . Ciprofloxacin Anaphylaxis    Heart and nerve issues  . Azithromycin Nausea And Vomiting  . Dilaudid [Hydromorphone Hcl] Other (See Comments)    Makes patient feel like she out of it  . Lactose     Other reaction(s): Other (See Comments) Digestive issues/pain  . Morphine And Related Itching  . Sulfa Antibiotics Nausea And Vomiting  . Tramadol Hcl     REACTION: HEART PALPITATIONS /SWELLING /SOB  . Buprenorphine Hcl Itching  . Oxcarbazepine Rash  . Pork-Derived Products Nausea And Vomiting     Review of Systems Pertinent items noted in HPI and remainder of comprehensive ROS otherwise negative.    Objective:     BP (!) 91/57 (BP Location: Left Arm, Patient Position: Sitting, Cuff Size: Normal)   Pulse 83   Ht 5\' 4"  (1.626 m)   Wt 115 lb (52.2 kg)   LMP 06/25/2015   BMI 19.74 kg/m   General appearance: alert and no distress Neck: no adenopathy, no carotid bruit, no JVD, supple, symmetrical, trachea midline and thyroid not enlarged, symmetric, no tenderness/mass/nodules Abdomen: soft, non-tender; bowel sounds normal; no masses,  no organomegaly Pelvic: external genitalia normal, rectovaginal septum normal.  Urethral cyst (~ 2 cm) present, visible at introitus. Vagina with scant thin white vaginal discharge.  Cervix normal appearing, no lesions and no motion tenderness.  Uterus mobile, nontender, normal shape and size.  Adnexae non-palpable, nontender bilaterally.  Extremities: extremities normal, atraumatic, no cyanosis or edema    Assessment:    Symptomatic uterine  fibroids.   Desires STD testing H/o abnormal uterine bleeding Urethral cyst  Plan:   - H/o uterine fibroids.   Paitent denies any pressure or pain symptoms.  Patient notes having an ultrasound performed at another clinic within the past year.  Notes fibroids were small.  Pelvic exam notes normal uterine size.  Discussed that as long as fibroids weren't bothersome (abnormal uterine bleeding,  pelvic pressure/pain symptoms), she did not require a hysterectomy. Will attempt to get records of last records (patient thinks it was performed at [facility] with PCP)  - GC/Cl and HIV testing ordered at patient's request. Counseled on safe sex practices.  - H/o abnormal uterine bleeding - patient notes that abnormal bleeding has resolved since stopping her Micronor.  Advised that if bleeding picks back up, she may need to consider hormonal suppression again.  Notes that she has been thinking about a Mirena IUD, as most people in her family went through menopause early (early 13s), however she's not sure if it will occur with her.  Thinks that if she were able to get something to last for 5 years, this might carry her through menopause.  Given handout.   - Patient notes that she has been worked up in the past as it was thought to be a diverticulum.  This was ruled out.  Is not bothersome, except occasionally having to splint to have a BM (however this may be due to other causes, based on patient's medical and surgical history).   To f/u as needed, or if symptoms return.    Rubie Maid, MD [facility]

## 2016-05-27 NOTE — Patient Instructions (Signed)

## 2016-05-28 LAB — HIV ANTIBODY (ROUTINE TESTING W REFLEX): HIV SCREEN 4TH GENERATION: NONREACTIVE

## 2016-05-29 ENCOUNTER — Inpatient Hospital Stay: Payer: Medicaid Other

## 2016-05-29 ENCOUNTER — Telehealth: Payer: Self-pay | Admitting: Obstetrics and Gynecology

## 2016-05-29 NOTE — Telephone Encounter (Signed)
PT CALLED AND HAD SOME QUESTIONS ABOUT HER LAB REUSLTS FROM 2/13 AND WOULD LIKE FOR YOU TO CALL HER BACK.

## 2016-05-29 NOTE — Telephone Encounter (Signed)
Called pt no answer. Unable to leave message as VM is not set up

## 2016-05-30 ENCOUNTER — Telehealth: Payer: Self-pay

## 2016-05-30 ENCOUNTER — Telehealth: Payer: Self-pay | Admitting: Obstetrics and Gynecology

## 2016-05-30 DIAGNOSIS — B9689 Other specified bacterial agents as the cause of diseases classified elsewhere: Secondary | ICD-10-CM

## 2016-05-30 DIAGNOSIS — N760 Acute vaginitis: Principal | ICD-10-CM

## 2016-05-30 LAB — NUSWAB VAGINITIS PLUS (VG+)
Atopobium vaginae: HIGH Score — AB
CANDIDA ALBICANS, NAA: NEGATIVE
CANDIDA GLABRATA, NAA: NEGATIVE
CHLAMYDIA TRACHOMATIS, NAA: NEGATIVE
Neisseria gonorrhoeae, NAA: NEGATIVE
Trich vag by NAA: NEGATIVE

## 2016-05-30 MED ORDER — METRONIDAZOLE 500 MG PO TABS: 500.0000 mg | ORAL_TABLET | Freq: Two times a day (BID) | ORAL | 0 refills | Status: DC

## 2016-05-30 NOTE — Telephone Encounter (Signed)
See my chart message

## 2016-05-30 NOTE — Telephone Encounter (Signed)
-----   Message from Rubie Maid, MD sent at 05/28/2016  3:37 PM EST ----- Please inform of negative HIV screen.

## 2016-05-30 NOTE — Telephone Encounter (Signed)
Called pt informed her of negative results and the need for treatment of BV.

## 2016-05-30 NOTE — Telephone Encounter (Signed)
RETURNING YOUR CALL, WAS UNABLE TO ATTACH TO PREVIOUS MESSAGE

## 2016-06-05 ENCOUNTER — Inpatient Hospital Stay: Payer: Medicaid Other

## 2016-06-05 ENCOUNTER — Inpatient Hospital Stay: Payer: Medicaid Other | Attending: Internal Medicine

## 2016-06-05 VITALS — BP 90/60 | HR 69 | Temp 97.5°F | Resp 18

## 2016-06-05 DIAGNOSIS — Z79899 Other long term (current) drug therapy: Secondary | ICD-10-CM | POA: Diagnosis not present

## 2016-06-05 DIAGNOSIS — E538 Deficiency of other specified B group vitamins: Secondary | ICD-10-CM | POA: Diagnosis present

## 2016-06-05 MED ORDER — CYANOCOBALAMIN 1000 MCG/ML IJ SOLN
1000.0000 ug | Freq: Once | INTRAMUSCULAR | Status: AC
  Administered 2016-06-05: 1000 ug via INTRAMUSCULAR

## 2016-06-06 ENCOUNTER — Other Ambulatory Visit: Payer: Self-pay | Admitting: Nurse Practitioner

## 2016-06-06 ENCOUNTER — Telehealth: Payer: Self-pay | Admitting: Obstetrics and Gynecology

## 2016-06-06 DIAGNOSIS — Z1231 Encounter for screening mammogram for malignant neoplasm of breast: Secondary | ICD-10-CM

## 2016-06-06 NOTE — Telephone Encounter (Signed)
pls advise

## 2016-06-06 NOTE — Telephone Encounter (Signed)
Patient called asking if Melody has reviewed her ultrasound from 07/2015.

## 2016-06-07 NOTE — Telephone Encounter (Signed)
Please inform patient that we still have not received her ultrasound from [place] (I can't remember which one she said).  We may need to send another request.

## 2016-06-26 ENCOUNTER — Inpatient Hospital Stay: Payer: Medicaid Other

## 2016-06-30 ENCOUNTER — Other Ambulatory Visit: Payer: Medicaid Other

## 2016-07-03 ENCOUNTER — Inpatient Hospital Stay: Payer: Medicaid Other | Attending: Internal Medicine

## 2016-07-03 VITALS — BP 106/77 | HR 71 | Temp 97.6°F | Resp 18

## 2016-07-03 DIAGNOSIS — Z79899 Other long term (current) drug therapy: Secondary | ICD-10-CM | POA: Diagnosis not present

## 2016-07-03 DIAGNOSIS — E538 Deficiency of other specified B group vitamins: Secondary | ICD-10-CM | POA: Insufficient documentation

## 2016-07-03 MED ORDER — CYANOCOBALAMIN 1000 MCG/ML IJ SOLN
1000.0000 ug | Freq: Once | INTRAMUSCULAR | Status: AC
  Administered 2016-07-03: 1000 ug via INTRAMUSCULAR

## 2016-07-07 ENCOUNTER — Ambulatory Visit: Payer: Medicaid Other | Admitting: Family Medicine

## 2016-07-08 ENCOUNTER — Ambulatory Visit: Payer: Medicaid Other | Admitting: Internal Medicine

## 2016-07-10 DIAGNOSIS — G96191 Perineural cyst: Secondary | ICD-10-CM | POA: Insufficient documentation

## 2016-07-17 ENCOUNTER — Emergency Department
Admission: EM | Admit: 2016-07-17 | Discharge: 2016-07-17 | Disposition: A | Discharge status: Home / Self Care | Payer: Medicaid Other | Attending: Emergency Medicine | Admitting: Emergency Medicine

## 2016-07-17 ENCOUNTER — Encounter: Payer: Self-pay | Admitting: Emergency Medicine

## 2016-07-17 DIAGNOSIS — Z79899 Other long term (current) drug therapy: Secondary | ICD-10-CM | POA: Diagnosis not present

## 2016-07-17 DIAGNOSIS — Z87891 Personal history of nicotine dependence: Secondary | ICD-10-CM | POA: Insufficient documentation

## 2016-07-17 DIAGNOSIS — T7840XA Allergy, unspecified, initial encounter: Secondary | ICD-10-CM | POA: Insufficient documentation

## 2016-07-17 MED ORDER — HYDROCODONE-ACETAMINOPHEN 5-325 MG PO TABS
0.5000 | ORAL_TABLET | Freq: Once | ORAL | Status: AC
  Administered 2016-07-17: 0.5 via ORAL
  Filled 2016-07-17: qty 1

## 2016-07-17 MED ORDER — METHYLPREDNISOLONE SODIUM SUCC 125 MG IJ SOLR
125.0000 mg | Freq: Once | INTRAMUSCULAR | Status: AC
  Administered 2016-07-17: 125 mg via INTRAVENOUS

## 2016-07-17 MED ORDER — DIPHENHYDRAMINE HCL 50 MG/ML IJ SOLN
INTRAMUSCULAR | Status: AC
  Administered 2016-07-17: 25 mg via INTRAVENOUS
  Filled 2016-07-17: qty 1

## 2016-07-17 MED ORDER — PREDNISONE 50 MG PO TABS: 50.0000 mg | ORAL_TABLET | Freq: Every day | ORAL | 0 refills | Status: DC

## 2016-07-17 MED ORDER — METHYLPREDNISOLONE SODIUM SUCC 125 MG IJ SOLR
INTRAMUSCULAR | Status: AC
  Administered 2016-07-17: 125 mg via INTRAVENOUS
  Filled 2016-07-17: qty 2

## 2016-07-17 MED ORDER — FAMOTIDINE IN NACL 20-0.9 MG/50ML-% IV SOLN
20.0000 mg | Freq: Once | INTRAVENOUS | Status: AC
  Administered 2016-07-17: 20 mg via INTRAVENOUS

## 2016-07-17 MED ORDER — FAMOTIDINE IN NACL 20-0.9 MG/50ML-% IV SOLN
INTRAVENOUS | Status: AC
  Administered 2016-07-17: 20 mg via INTRAVENOUS
  Filled 2016-07-17: qty 50

## 2016-07-17 MED ORDER — DIPHENHYDRAMINE HCL 50 MG/ML IJ SOLN
25.0000 mg | Freq: Once | INTRAMUSCULAR | Status: AC
  Administered 2016-07-17: 25 mg via INTRAVENOUS

## 2016-07-17 MED ORDER — CEPHALEXIN 500 MG PO CAPS: 500.0000 mg | ORAL_CAPSULE | Freq: Two times a day (BID) | ORAL | 0 refills | Status: DC

## 2016-07-17 MED ORDER — DEXTROSE 5 % IV SOLN: 1.0000 g | Freq: Once | INTRAVENOUS | Status: DC

## 2016-07-17 MED ORDER — CEFTRIAXONE SODIUM-DEXTROSE 1-3.74 GM-% IV SOLR
1.0000 g | Freq: Once | INTRAVENOUS | Status: AC
  Administered 2016-07-17: 1 g via INTRAVENOUS
  Filled 2016-07-17: qty 50

## 2016-07-17 NOTE — ED Triage Notes (Signed)
Pt in via POV with complaints of hives, shortness of breath, tongue swelling upon waking this morning.  Pt reports starting "a different type of Macrobid" yesterday around lunch.  Pt speaking in clear, full sentences, ambulatory to room at this time.

## 2016-07-17 NOTE — ED Provider Notes (Signed)
[facility] Emergency Department Provider Note   ____________________________________________    I have reviewed the triage vital signs and the nursing notes.   HISTORY  Chief Complaint Allergic Reaction     HPI Melissa Pruitt is a 41 y.o. female with a history of neurogenic bladder with frequent urinary tract infections, she saw her PCP yesterday to obtain culture for possible UTI. Started Macrobid as she has many times in the past. Today woke up with intense itching diffusely and rash. Feels that her tongue is slightly larger than usual. No difficulty breathing. No history of anaphylaxis. She has never had this reaction before. No nausea or vomiting. No fevers or chills   Past Medical History:  Diagnosis Date  . Abnormal Pap smear of cervix   . Cervical cancer [facility])    removed in July 2009  . 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 Normocytic anemia 08/21/2014  . 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 Vitamin B 12 deficiency 08/21/2014    Patient Active Problem List   Diagnosis Date Noted  . Normocytic anemia 08/21/2014  . Vitamin B 12 deficiency 08/21/2014  . Cervical pain 08/25/2012  . Pain in joint, pelvic region and thigh 07/16/2012  . 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 Balance disorder 07/16/2012  . Personal history of fall 07/16/2012  . 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 [place] SP CORD&NERV SYSTEM 05/08/2010  . H N P-LUMBAR 04/23/2010  . BACK PAIN 01/28/2010    Past Surgical History:  Procedure Laterality Date  . BACK SURGERY    . BLADDER SURGERY    . COLOSTOMY     after flex sig as 41 year old.   . colostomy reversal    . EXPLORATORY LAPAROTOMY     with colostomy placement  . LEEP    . removal of cervical cancer    . TUBAL LIGATION      Prior to Admission medications   Medication  Sig Start Date End Date Taking? Authorizing Provider  diclofenac sodium (VOLTAREN) 1 % GEL Apply 1 application topically 4 (four) times daily as needed. arthritis   Yes Historical Provider, MD  fluticasone (FLONASE) 50 MCG/ACT nasal spray Place 2 sprays into the nose daily.   Yes Historical Provider, MD  HYDROcodone-acetaminophen (NORCO) 5-325 MG tablet Take 1 tablet by mouth every 6 (six) hours as needed for moderate pain. 10/21/15  Yes Paulette Blanch, MD  lidocaine (LIDODERM) 5 % Place 1 patch onto the skin daily. Remove & Discard patch within 12 hours or as directed by MD    Yes Historical Provider, MD  loratadine (CLARITIN) 10 MG tablet Take 10 mg by mouth daily.     Yes Historical Provider, MD  methocarbamol (ROBAXIN) 750 MG tablet Take 750 mg by mouth 3 (three) times daily.   Yes Historical Provider, MD  montelukast (SINGULAIR) 10 MG tablet Take 10 mg by mouth at bedtime.   Yes Historical Provider, MD  norethindrone (MICRONOR,CAMILA,ERRIN) 0.35 MG tablet Take 1 tablet by mouth daily.   Yes Historical Provider, MD  ondansetron (ZOFRAN) 4 MG tablet Take 1 tablet (4 mg total) by mouth daily as needed for nausea or vomiting. 11/08/14  Yes Earleen Newport, MD  vitamin B-12 (CYANOCOBALAMIN) 1000 MCG tablet Inject as directed every thirty (30) days.   Yes Historical Provider, MD  Allergies Ciprofloxacin; Macrobid [nitrofurantoin monohyd macro]; Morphine and related; Azithromycin; Dilaudid [hydromorphone hcl]; Lactose; Sulfa antibiotics; Tramadol hcl; Buprenorphine hcl; Oxcarbazepine; and Pork-derived products  Family History  Problem Relation Age of Onset  . Hypertension Mother   . Cervical cancer Maternal Grandmother   . Colon cancer Neg Hx   . Breast cancer Neg Hx     Social History Social History  Substance Use Topics  . Smoking status: Former Smoker    Types: Cigarettes    Quit date: 07/14/2011  . Smokeless tobacco: Never Used     Comment: 1-3 cigarettes  . Alcohol use Yes      Comment: a beer here and there    Review of Systems  Constitutional: No fever/chills Eyes: No visual changes.  ENT: Question of tongue swelling Cardiovascular: Denies chest pain. Respiratory: Denies shortness of breath. Gastrointestinal: No abdominal pain.  No nausea, no vomiting.   Genitourinary: As above Musculoskeletal: Negative for back pain. Skin: As above Neurological: Negative for headaches  10-point ROS otherwise negative.  ____________________________________________   PHYSICAL EXAM:  VITAL SIGNS: ED Triage Vitals  Enc Vitals Group     BP 07/17/16 0835 130/60     Pulse Rate 07/17/16 0835 99     Resp 07/17/16 0835 18     Temp 07/17/16 0835 97.9 F (36.6 C)     Temp Source 07/17/16 0835 Oral     SpO2 07/17/16 0835 99 %     Weight 07/17/16 0836 115 lb (52.2 kg)     Height 07/17/16 0836 5\' 4"  (1.626 m)     Head Circumference --      Peak Flow --      Pain Score 07/17/16 0839 0     Pain Loc --      Pain Edu? --      Excl. in [place]? --     Constitutional: Alert and oriented. No acute distress. Pleasant and interactive Eyes: Conjunctivae are normal.   Nose: No congestion/rhinnorhea. Mouth/Throat: Mucous membranes are moist.  Tongue appears normal, no pharyngeal swelling  Cardiovascular: Normal rate, regular rhythm. Grossly normal heart sounds.  Good peripheral circulation. Respiratory: Normal respiratory effort.  No retractions. Lungs CTAB. Gastrointestinal: Soft and nontender. No distention.  No CVA tenderness. Genitourinary: deferred Musculoskeletal: No lower extremity tenderness nor edema.  Warm and well perfused Neurologic:  Normal speech and language. No gross focal neurologic deficits are appreciated.  Skin:  Skin is warm, dry and intact. Diffuse rash noted consistent with urticaria Psychiatric: Mood and affect are normal. Speech and behavior are normal.  ____________________________________________   LABS (all labs ordered are listed, but only  abnormal results are displayed)  Labs Reviewed - No data to display ____________________________________________  EKG  None ____________________________________________  RADIOLOGY  None ____________________________________________   PROCEDURES  Procedure(s) performed: No    Critical Care performed: No ____________________________________________   INITIAL IMPRESSION / ASSESSMENT AND PLAN / ED COURSE  Pertinent labs & imaging results that were available during my care of the patient were reviewed by me and considered in my medical decision making (see chart for details).  Patient with allergic reaction, possibly related to [place]. We will give IV Benadryl, IV Solu-Medrol, IV Pepcid and monitor closely.   ----------------------------------------- 12:52 PM on 07/17/2016 -----------------------------------------  Patient's symptoms have almost entirely abated. She requests IV Rocephin given her history of allergies and the need to treat her UTI. I think this is reasonable. We will then discharge her with prednisone and Benadryl when necessary  ____________________________________________   FINAL CLINICAL IMPRESSION(S) / ED DIAGNOSES  Final diagnoses:  Allergic reaction, initial encounter      NEW MEDICATIONS STARTED DURING THIS VISIT:  New Prescriptions   No medications on file     Note:  This document was prepared using Dragon voice recognition software and may include unintentional dictation errors.    Lavonia Drafts, MD 07/17/16 [phone]

## 2016-07-17 NOTE — ED Notes (Signed)
Pt resting in bed, lowered for her comfort, lights dimmed, resp even and unlabored

## 2016-07-17 NOTE — ED Notes (Signed)
Pt resting in bed, resp even and unlabored, denies any needs at this time

## 2016-07-17 NOTE — ED Notes (Signed)
Pt states she began taking Macrobid yesterday for hx of neurogenic bladder, states this AM she woke up with itching and hives with SOB, pt speaking in full clear sentences, lungs clear, hives noticed on abd and legs and chest, pt did not take any OTC meds before arrival

## 2016-07-17 NOTE — ED Notes (Signed)
Pt given coffee per okay by Dr. Corky Downs, pt resting in bed, pt states feeling less itching and less SOB, pt appears in no distress, texting on cellphone, less hives visible on body upon assessment

## 2016-07-17 NOTE — ED Notes (Signed)
EDP at bedside  

## 2016-07-19 ENCOUNTER — Emergency Department
Admission: EM | Admit: 2016-07-19 | Discharge: 2016-07-20 | Disposition: A | Discharge status: Home / Self Care | Payer: Medicaid Other | Attending: Emergency Medicine | Admitting: Emergency Medicine

## 2016-07-19 ENCOUNTER — Emergency Department: Payer: Medicaid Other

## 2016-07-19 DIAGNOSIS — R079 Chest pain, unspecified: Secondary | ICD-10-CM | POA: Insufficient documentation

## 2016-07-19 DIAGNOSIS — Z87891 Personal history of nicotine dependence: Secondary | ICD-10-CM | POA: Insufficient documentation

## 2016-07-19 DIAGNOSIS — R202 Paresthesia of skin: Secondary | ICD-10-CM | POA: Diagnosis not present

## 2016-07-19 LAB — COMPREHENSIVE METABOLIC PANEL
ALK PHOS: 28 U/L — AB (ref 38–126)
ALT: 9 U/L — ABNORMAL LOW (ref 14–54)
ANION GAP: 7 (ref 5–15)
AST: 15 U/L (ref 15–41)
Albumin: 4.4 g/dL (ref 3.5–5.0)
BILIRUBIN TOTAL: 0.7 mg/dL (ref 0.3–1.2)
BUN: 11 mg/dL (ref 6–20)
CALCIUM: 9.5 mg/dL (ref 8.9–10.3)
CO2: 27 mmol/L (ref 22–32)
Chloride: 104 mmol/L (ref 101–111)
Creatinine, Ser: 0.67 mg/dL (ref 0.44–1.00)
Glucose, Bld: 88 mg/dL (ref 65–99)
Potassium: 3.3 mmol/L — ABNORMAL LOW (ref 3.5–5.1)
SODIUM: 138 mmol/L (ref 135–145)
TOTAL PROTEIN: 7 g/dL (ref 6.5–8.1)

## 2016-07-19 LAB — CBC WITH DIFFERENTIAL/PLATELET
BASOS ABS: 0.1 10*3/uL (ref 0–0.1)
Basophils Relative: 1 %
EOS ABS: 0 10*3/uL (ref 0–0.7)
EOS PCT: 1 %
HCT: 39.4 % (ref 35.0–47.0)
Hemoglobin: 13.3 g/dL (ref 12.0–16.0)
Lymphocytes Relative: 51 %
Lymphs Abs: 3.4 10*3/uL (ref 1.0–3.6)
MCH: 30.2 pg (ref 26.0–34.0)
MCHC: 33.9 g/dL (ref 32.0–36.0)
MCV: 89 fL (ref 80.0–100.0)
Monocytes Absolute: 0.5 10*3/uL (ref 0.2–0.9)
Monocytes Relative: 7 %
Neutro Abs: 2.6 10*3/uL (ref 1.4–6.5)
Neutrophils Relative %: 40 %
Platelets: 225 10*3/uL (ref 150–440)
RBC: 4.43 MIL/uL (ref 3.80–5.20)
RDW: 13.7 % (ref 11.5–14.5)
WBC: 6.5 10*3/uL (ref 3.6–11.0)

## 2016-07-19 LAB — TROPONIN I: Troponin I: <0.03 ng/mL (ref <0.03)

## 2016-07-19 NOTE — ED Notes (Signed)
Pt was in ED 36 hours ago for reaction to macrobid secondary to UTI, pt was treated with steroids, benadryl, pt now reports splotchy left arm and chest pain worse with deep breathing, pt reports tingling and numbness to left arm.  Weakness to left hand grip.

## 2016-07-19 NOTE — ED Provider Notes (Signed)
[facility] Emergency Department Provider Note  ____________________________________________   First MD Initiated Contact with Patient 07/19/16 2151     (approximate)  I have reviewed the triage vital signs and the nursing notes.   HISTORY  Chief Complaint Chest Pain (From the Prednisone); Numbness (From the Prednisone); Weakness (From the Prednisone ); Diarrhea (Decreased urinary output); Shortness of Breath (Insomnia ); and Rash   HPI Melissa Pruitt is a 41 y.o. female who took prednisone yesterday afternoon for an allergic reaction to Macrobid was presenting to the emergency department today with left-sided chest aching as well as paresthesia to the left upper extremity. She says that her left upper extremity feels like it is "asleep." She says that she has had this sensation since this morning. Says that she is no longer having any rash from the [place]. Denies any abdominal pain or nausea or vomiting. Does not report any shortness of breath May but is reported on her chief complaint list. Also with blurred vision to the left eye which is now resolved. However, she does have mild headache to the left temple. Patient also saying that she is having pain to the left trapezius muscle.   Past Medical History:  Diagnosis Date  . Abnormal Pap smear of cervix   . Cervical cancer [facility])    removed in July 2009  . 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 Normocytic anemia 08/21/2014  . 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 Vitamin B 12 deficiency 08/21/2014    Patient Active Problem List   Diagnosis Date Noted  . Normocytic anemia 08/21/2014  . Vitamin B 12 deficiency 08/21/2014  . Cervical pain 08/25/2012  . Pain in joint, pelvic region and thigh 07/16/2012  . 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 Balance disorder 07/16/2012  . Personal history of fall 07/16/2012  . 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([place]) 12/23/2010    . 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 [place] SP CORD&NERV SYSTEM 05/08/2010  . H N P-LUMBAR 04/23/2010  . BACK PAIN 01/28/2010    Past Surgical History:  Procedure Laterality Date  . BACK SURGERY    . BLADDER SURGERY    . COLOSTOMY     after flex sig as 41 year old.   . colostomy reversal    . EXPLORATORY LAPAROTOMY     with colostomy placement  . LEEP    . removal of cervical cancer    . TUBAL LIGATION      Prior to Admission medications   Medication Sig Start Date End Date Taking? Authorizing Provider  cephALEXin (KEFLEX) 500 MG capsule Take 1 capsule (500 mg total) by mouth 2 (two) times daily. 07/17/16   Lavonia Drafts, MD  diclofenac sodium (VOLTAREN) 1 % GEL Apply 1 application topically 4 (four) times daily as needed. arthritis    Historical Provider, MD  fluticasone (FLONASE) 50 MCG/ACT nasal spray Place 2 sprays into the nose daily.    Historical Provider, MD  HYDROcodone-acetaminophen (NORCO) 5-325 MG tablet Take 1 tablet by mouth every 6 (six) hours as needed for moderate pain. 10/21/15   Paulette Blanch, MD  lidocaine (LIDODERM) 5 % Place 1 patch onto the skin daily. Remove & Discard patch within 12 hours or as directed by MD     Historical Provider, MD  loratadine (CLARITIN) 10 MG tablet Take 10 mg by mouth daily.      Historical Provider, MD  methocarbamol (ROBAXIN) 750 MG tablet Take 750 mg by mouth 3 (three) times daily.    Historical Provider, MD  montelukast (SINGULAIR) 10 MG tablet Take 10 mg by mouth at bedtime.    Historical Provider, MD  norethindrone (MICRONOR,CAMILA,ERRIN) 0.35 MG tablet Take 1 tablet by mouth daily.    Historical Provider, MD  ondansetron (ZOFRAN) 4 MG tablet Take 1 tablet (4 mg total) by mouth daily as needed for nausea or vomiting. 11/08/14   Earleen Newport, MD  predniSONE (DELTASONE) 50 MG tablet Take 1 tablet (50 mg total) by mouth daily with breakfast. 07/17/16   Lavonia Drafts, MD  vitamin B-12  (CYANOCOBALAMIN) 1000 MCG tablet Inject as directed every thirty (30) days.    Historical Provider, MD    Allergies Ciprofloxacin; Macrobid [nitrofurantoin monohyd macro]; Morphine and related; Azithromycin; Dilaudid [hydromorphone hcl]; Lactose; Sulfa antibiotics; Tramadol hcl; Oxcarbazepine; and Pork-derived products  Family History  Problem Relation Age of Onset  . Hypertension Mother   . Cervical cancer Maternal Grandmother   . Colon cancer Neg Hx   . Breast cancer Neg Hx     Social History Social History  Substance Use Topics  . Smoking status: Former Smoker    Types: Cigarettes    Quit date: 07/14/2011  . Smokeless tobacco: Never Used     Comment: 1-3 cigarettes  . Alcohol use Yes     Comment: a beer here and there    Review of Systems Constitutional: No fever/chills Eyes: No visual changes. ENT: No sore throat. Cardiovascular: as above Respiratory: Denies shortness of breath. Gastrointestinal: No abdominal pain.  No nausea, no vomiting.  No diarrhea.  No constipation. Genitourinary: Negative for dysuria. Musculoskeletal: Negative for back pain. Skin: Negative for rash. Neurological: Negative for  focal weakness   10-point ROS otherwise negative.  ____________________________________________   PHYSICAL EXAM:  VITAL SIGNS: ED Triage Vitals  Enc Vitals Group     BP 07/19/16 2138 (!) 84/69     Pulse Rate 07/19/16 2138 69     Resp 07/19/16 2138 18     Temp 07/19/16 2138 98.8 F (37.1 C)     Temp Source 07/19/16 2138 Oral     SpO2 07/19/16 2138 99 %     Weight 07/19/16 2140 112 lb (50.8 kg)     Height 07/19/16 2140 5\' 4"  (1.626 m)     Head Circumference --      Peak Flow --      Pain Score 07/19/16 2137 8     Pain Loc --      Pain Edu? --      Excl. in [place]? --     Constitutional: Alert and oriented. Well appearing and in no acute distress. Eyes: Conjunctivae are normal. PERRL. EOMI. Head: Atraumatic. Nose: No congestion/rhinnorhea. Mouth/Throat:  Mucous membranes are moist.   Neck: No stridor.   Cardiovascular: Normal rate, regular rhythm. Grossly normal heart sounds.  Good peripheral circulation With intact, equal and bilateral radial as well as dorsalis pedis pulses. Respiratory: Normal respiratory effort.  No retractions. Lungs CTAB. Gastrointestinal: Soft and nontender. No distention.No CVA tenderness. Musculoskeletal: No lower extremity tenderness nor edema.  No joint effusions. Neurologic:  Normal speech and language. No gross focal neurologic deficits are appreciated.  Sensation is intact to light touch. Skin:  Skin is warm, dry and intact. No rash noted. Psychiatric: Mood and affect are normal. Speech and behavior are normal.  ____________________________________________   LABS (all labs ordered are listed, but only abnormal results  are displayed)  Labs Reviewed  COMPREHENSIVE METABOLIC PANEL - Abnormal; Notable for the following:       Result Value   Potassium 3.3 (*)    ALT 9 (*)    Alkaline Phosphatase 28 (*)    All other components within normal limits  CBC WITH DIFFERENTIAL/PLATELET  TROPONIN I   ____________________________________________  EKG  ED ECG REPORT I, [place],  Youlanda Roys, the attending physician, personally viewed and interpreted this ECG.   Date: 07/19/2016  EKG Time: 2202  Rate: 58  Rhythm: sinus bradycardia  Axis: Normal axis  Intervals:Short PR interval.  ST&T Change: No ST segment elevation or depression. No abnormal T-wave inversion.  ____________________________________________  [id number]  CT Head Wo Contrast (Final result)  Result time 07/19/16 23:06:56  Final result by Massie Kluver, MD (07/19/16 23:06:56)           Narrative:   CLINICAL DATA: Right-sided headache and right arm numbness. Left-sided chest pain. Reaction to macrobid medication.  EXAM: CT HEAD WITHOUT CONTRAST  TECHNIQUE: Contiguous axial images were obtained from the base of the skull through the  vertex without intravenous contrast.  COMPARISON: None.  FINDINGS: BRAIN: The ventricles and sulci are normal. No intraparenchymal hemorrhage, mass effect nor midline shift. No acute large vascular territory infarcts. Grey-white matter distinction is maintained. The basal ganglia are unremarkable. No abnormal extra-axial fluid collections. Basal cisterns are not effaced and midline. The brainstem and cerebellar hemispheres are without acute abnormalities.  VASCULAR: Unremarkable.  SKULL/SOFT TISSUES: No skull fracture. No significant soft tissue swelling.  ORBITS/SINUSES: The included ocular globes and orbital contents are normal.The mastoid air cells are clear. The included paranasal sinuses are well-aerated.  OTHER: None.  IMPRESSION: No acute intracranial abnormality.   Electronically Signed By: Ashley Royalty M.D. On: 07/19/2016 23:06            DG Chest 2 View (Final result)  Result time 07/19/16 23:03:49  Final result by Massie Kluver, MD (07/19/16 23:03:49)           Narrative:   CLINICAL DATA: Reaction to macrobid secondary to urinary tract infection. Chest pain with deep breaths.  EXAM: CHEST 2 VIEW  COMPARISON: None.  FINDINGS: The heart size and mediastinal contours are within normal limits. No pulmonary edema. Hyperinflated appearance of the lungs without pneumonic consolidation or pneumothorax. No effusion. The visualized skeletal structures are unremarkable.  IMPRESSION: Hyperinflated lungs without acute pulmonary consolidation, effusion or pneumothorax.   Electronically Signed By: Ashley Royalty M.D. On: 07/19/2016 23:03          ____________________________________________   PROCEDURES  Procedure(s) performed:   Procedures  Critical Care performed:   ____________________________________________   INITIAL IMPRESSION / ASSESSMENT AND PLAN / ED COURSE  Pertinent labs & imaging results that were available  during my care of the patient were reviewed by me and considered in my medical decision making (see chart for details).  ----------------------------------------- 11:55 PM on 07/19/2016 -----------------------------------------  With very reassuring workup. Likely reaction to prednisone. Possible radiculopathy as well. She says that she has diclofenac gel at home. She says she has no more rash from the reaction for the Macrobid. Will be discharged home. Will follow-up with her primary care doctor at her scheduled appointment this Tuesday.      ____________________________________________   FINAL CLINICAL IMPRESSION(S) / ED DIAGNOSES  Paresthesia. Chest pain.    NEW MEDICATIONS STARTED DURING THIS VISIT:  New Prescriptions   No medications on file     Note:  This document was prepared using Dragon voice recognition software and may include unintentional dictation errors.    Orbie Pyo, MD 07/19/16 [id number]

## 2016-07-19 NOTE — ED Triage Notes (Signed)
Patient presents to ED with complaints of "pain all over that I know the Prednisone caused." She states she has the feeling that her left eye is "gonna blow out" and "my left arm is tingling and my chest hurts and my shoulder hurts, I didn't sleep at all last night, I can't pee, I don't feel like myself, I just think I don't ever want to take this Prednisone ever again."

## 2016-07-31 ENCOUNTER — Inpatient Hospital Stay: Payer: Medicaid Other

## 2016-07-31 ENCOUNTER — Inpatient Hospital Stay: Payer: Medicaid Other | Attending: Internal Medicine

## 2016-07-31 DIAGNOSIS — Z79899 Other long term (current) drug therapy: Secondary | ICD-10-CM | POA: Insufficient documentation

## 2016-07-31 DIAGNOSIS — E538 Deficiency of other specified B group vitamins: Secondary | ICD-10-CM | POA: Diagnosis not present

## 2016-07-31 MED ORDER — CYANOCOBALAMIN 1000 MCG/ML IJ SOLN
1000.0000 ug | Freq: Once | INTRAMUSCULAR | Status: AC
  Administered 2016-07-31: 1000 ug via INTRAMUSCULAR
  Filled 2016-07-31: qty 1

## 2016-08-09 ENCOUNTER — Emergency Department: Payer: Medicaid Other

## 2016-08-09 ENCOUNTER — Emergency Department
Admission: EM | Admit: 2016-08-09 | Discharge: 2016-08-09 | Disposition: A | Discharge status: Home / Self Care | Payer: Medicaid Other | Attending: Emergency Medicine | Admitting: Emergency Medicine

## 2016-08-09 DIAGNOSIS — Y999 Unspecified external cause status: Secondary | ICD-10-CM | POA: Diagnosis not present

## 2016-08-09 DIAGNOSIS — S0011XA Contusion of right eyelid and periocular area, initial encounter: Secondary | ICD-10-CM | POA: Diagnosis not present

## 2016-08-09 DIAGNOSIS — Z8541 Personal history of malignant neoplasm of cervix uteri: Secondary | ICD-10-CM | POA: Insufficient documentation

## 2016-08-09 DIAGNOSIS — W1809XA Striking against other object with subsequent fall, initial encounter: Secondary | ICD-10-CM | POA: Insufficient documentation

## 2016-08-09 DIAGNOSIS — S060X0A Concussion without loss of consciousness, initial encounter: Secondary | ICD-10-CM | POA: Diagnosis not present

## 2016-08-09 DIAGNOSIS — Z79899 Other long term (current) drug therapy: Secondary | ICD-10-CM | POA: Diagnosis not present

## 2016-08-09 DIAGNOSIS — Z87891 Personal history of nicotine dependence: Secondary | ICD-10-CM | POA: Insufficient documentation

## 2016-08-09 DIAGNOSIS — Y92002 Bathroom of unspecified non-institutional (private) residence single-family (private) house as the place of occurrence of the external cause: Secondary | ICD-10-CM | POA: Insufficient documentation

## 2016-08-09 DIAGNOSIS — Y9389 Activity, other specified: Secondary | ICD-10-CM | POA: Insufficient documentation

## 2016-08-09 DIAGNOSIS — S0990XA Unspecified injury of head, initial encounter: Secondary | ICD-10-CM | POA: Diagnosis present

## 2016-08-09 MED ORDER — IOPAMIDOL (ISOVUE-300) INJECTION 61%: 30.0000 mL | Freq: Once | INTRAVENOUS | Status: DC | PRN

## 2016-08-09 MED ORDER — ACETAMINOPHEN 325 MG PO TABS
650.0000 mg | ORAL_TABLET | Freq: Once | ORAL | Status: AC
  Administered 2016-08-09: 650 mg via ORAL
  Filled 2016-08-09: qty 2

## 2016-08-09 MED ORDER — MECLIZINE HCL 12.5 MG PO TABS: 12.5000 mg | ORAL_TABLET | Freq: Three times a day (TID) | ORAL | 0 refills | Status: DC | PRN

## 2016-08-09 NOTE — ED Notes (Addendum)
Patient reports hitting her head twice, approx. 1 week apart and states this second time-last night-pain was much more severe and she immediately had a large bruise to her forehead.  Patient is photophobic at this time.

## 2016-08-09 NOTE — ED Notes (Signed)
Spoke with dr. Cinda Quest regarding pt's presentation and complaints of dizziness post striking forehead. Order for ct received.

## 2016-08-09 NOTE — ED Triage Notes (Signed)
Pt states she struck her right forehead on corner of cabinet prior to arrival. Pt with small hematoma noted to forehead. Pt was ambulatory in lobby prior to triage. Pt states she now feels dizzy. Pt denies loc.

## 2016-08-09 NOTE — ED Provider Notes (Signed)
[facility] Emergency Department Provider Note  ____________________________________________  Time seen: Approximately 7:23 AM  I have reviewed the triage vital signs and the nursing notes.   HISTORY  Chief Complaint Head Injury    HPI Melissa Pruitt is a 41 y.o. female that presents to the emergency department after falling and hitting her head on the wall 5 hours ago. Patient states that she was getting up and going to the bathroom when she hit her head. She states that she has spina bifida and took her brace off and it was dark so she cannot see where she was going. She states that one week ago she hit her head on the dresser in the exact same spot. After hitting her head the first time, she had a headache on and off for a couple days. Symptoms improved. After hitting her head this morning, she had blurry vision for a couple minutes, and she is still having occasional episodes of dizziness and light sensitivity. She is having a throbbing headache in the front currently. She has been applying ice all morning. She has not taken anything for pain. She denies shortness of breath, chest pain, nausea, vomiting, abdominal pain.  Past Medical History:  Diagnosis Date  . Abnormal Pap smear of cervix   . Cervical cancer [facility])    removed in July 2009  . 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 Normocytic anemia 08/21/2014  . 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 Vitamin B 12 deficiency 08/21/2014    Patient Active Problem List   Diagnosis Date Noted  . Normocytic anemia 08/21/2014  . Vitamin B 12 deficiency 08/21/2014  . Cervical pain 08/25/2012  . Pain in joint, pelvic region and thigh 07/16/2012  . 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 Balance disorder 07/16/2012  . Personal history of fall 07/16/2012  . 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 [place] SP CORD&NERV SYSTEM 05/08/2010  . H N P-LUMBAR 04/23/2010  . BACK PAIN 01/28/2010    Past Surgical History:  Procedure Laterality Date  . BACK SURGERY    . BLADDER SURGERY    . COLOSTOMY     after flex sig as 41 year old.   . colostomy reversal    . EXPLORATORY LAPAROTOMY     with colostomy placement  . LEEP    . removal of cervical cancer    . TUBAL LIGATION      Prior to Admission medications   Medication Sig Start Date End Date Taking? Authorizing Provider  cephALEXin (KEFLEX) 500 MG capsule Take 1 capsule (500 mg total) by mouth 2 (two) times daily. 07/17/16   Lavonia Drafts, MD  diclofenac sodium (VOLTAREN) 1 % GEL Apply 1 application topically 4 (four) times daily as needed. arthritis    Historical Provider, MD  fluticasone (FLONASE) 50 MCG/ACT nasal spray Place 2 sprays into the nose daily.    Historical Provider, MD  HYDROcodone-acetaminophen (NORCO) 5-325 MG tablet Take 1 tablet by mouth every 6 (six) hours as needed for moderate pain. 10/21/15   Paulette Blanch, MD  lidocaine (LIDODERM) 5 % Place 1 patch onto the skin daily. Remove & Discard patch within 12 hours or as directed by MD     Historical Provider, MD  loratadine (CLARITIN) 10 MG tablet Take 10 mg by mouth daily.      Historical Provider, MD  meclizine (ANTIVERT) 12.5 MG tablet Take 1 tablet (12.5 mg total) by mouth 3 (three) times daily as needed for dizziness. 08/09/16   Laban Emperor, PA-C  methocarbamol (ROBAXIN) 750 MG tablet Take 750 mg by mouth 3 (three) times daily.    Historical Provider, MD  montelukast (SINGULAIR) 10 MG tablet Take 10 mg by mouth at bedtime.    Historical Provider, MD  norethindrone (MICRONOR,CAMILA,ERRIN) 0.35 MG tablet Take 1 tablet by mouth daily.    Historical Provider, MD  ondansetron (ZOFRAN) 4 MG tablet Take 1 tablet (4 mg total) by mouth daily as needed for nausea or vomiting. 11/08/14   Earleen Newport, MD  predniSONE (DELTASONE) 50 MG tablet Take 1 tablet (50 mg  total) by mouth daily with breakfast. 07/17/16   Lavonia Drafts, MD  vitamin B-12 (CYANOCOBALAMIN) 1000 MCG tablet Inject as directed every thirty (30) days.    Historical Provider, MD    Allergies Ciprofloxacin; Macrobid [nitrofurantoin monohyd macro]; Morphine and related; Azithromycin; Dilaudid [hydromorphone hcl]; Lactose; Prednisolone; Sulfa antibiotics; Tramadol hcl; Oxcarbazepine; and Pork-derived products  Family History  Problem Relation Age of Onset  . Hypertension Mother   . Cervical cancer Maternal Grandmother   . Colon cancer Neg Hx   . Breast cancer Neg Hx     Social History Social History  Substance Use Topics  . Smoking status: Former Smoker    Types: Cigarettes    Quit date: 07/14/2011  . Smokeless tobacco: Never Used     Comment: 1-3 cigarettes  . Alcohol use Yes     Comment: a beer here and there     Review of Systems  Cardiovascular: No chest pain. Respiratory: No SOB. Gastrointestinal: No abdominal pain.  No nausea, no vomiting.  Musculoskeletal: Negative for musculoskeletal pain. Skin: Negative for rash, abrasions, lacerations. Positive for ecchymosis to forehead. Neurological: Negative for numbness or tingling. Positive for headache.   ____________________________________________   PHYSICAL EXAM:  VITAL SIGNS: ED Triage Vitals  Enc Vitals Group     BP 08/09/16 0407 (!) 130/53     Pulse Rate 08/09/16 0407 94     Resp 08/09/16 0407 16     Temp 08/09/16 0407 98.3 F (36.8 C)     Temp Source 08/09/16 0407 Oral     SpO2 08/09/16 0407 100 %     Weight 08/09/16 0408 110 lb (49.9 kg)     Height 08/09/16 0408 5\' 4"  (1.626 m)     Head Circumference --      Peak Flow --      Pain Score 08/09/16 0407 8     Pain Loc --      Pain Edu? --      Excl. in [place]? --      Constitutional: Alert and oriented. Well appearing and in no acute distress. Eyes: Conjunctivae are normal. PERRL. EOMI. Head:  ENT:      Ears: Tympanic membranes were gray with good  landmark.      Nose: No congestion/rhinnorhea.      Mouth/Throat: Mucous membranes are moist. Oropharynx nonerythematous. Neck: No stridor.  No cervical spine tenderness to palpation. Cardiovascular: Normal rate, regular rhythm.  Good peripheral circulation. Respiratory: Normal respiratory effort without tachypnea or retractions. Lungs CTAB. Good air entry to the bases with no decreased or absent breath sounds. Gastrointestinal: Bowel sounds 4 quadrants. Soft and nontender to palpation. No guarding or rigidity. No palpable masses. No distention.  Musculoskeletal: Full range of motion to all extremities. No gross deformities appreciated.  Neurologic:  Normal speech and language. No gross focal neurologic deficits are appreciated.  Skin:  Skin is warm, dry. 1 cm area of ecchymosis above right eyebrow. Psychiatric: Mood and affect are normal. Speech and behavior are normal. Patient exhibits appropriate insight and judgement.   ____________________________________________   LABS (all labs ordered are listed, but only abnormal results are displayed)  Labs Reviewed - No data to display ____________________________________________  EKG   ____________________________________________  RADIOLOGY  Ct Head Wo Contrast  Result Date: 08/09/2016 CLINICAL DATA:  41 year old female with dizziness and head injury. EXAM: CT HEAD WITHOUT CONTRAST TECHNIQUE: Contiguous axial images were obtained from the base of the skull through the vertex without intravenous contrast. COMPARISON:  Head CT dated 07/19/2016 FINDINGS: Brain: No evidence of acute infarction, hemorrhage, hydrocephalus, extra-axial collection or mass lesion/mass effect. Vascular: No hyperdense vessel or unexpected calcification. Skull: Normal. Negative for fracture or focal lesion. Sinuses/Orbits: There is opacification of an ethmoid air cell. No air-fluid level. The remainder of the visualized paranasal sinuses and mastoid air cells are clear.  Other: None IMPRESSION: Normal unenhanced CT of the brain. Electronically Signed   By: Anner Crete M.D.   On: 08/09/2016 04:51    ____________________________________________    PROCEDURES  Procedure(s) performed:    Procedures    Medications  acetaminophen (TYLENOL) tablet 650 mg (650 mg Oral Given 08/09/16 0744)     ____________________________________________   INITIAL IMPRESSION / ASSESSMENT AND PLAN / ED COURSE  Pertinent labs & imaging results that were available during my care of the patient were reviewed by me and considered in my medical decision making (see chart for details).  Review of the [place] CSRS was performed in accordance of the [place] prior to dispensing any controlled drugs.   Patient's diagnosis is consistent with concussion. Vital signs and exam are reassuring. Head CT negative for acute processes. Patient denies any dizziness currently. Education about concussions was provided. Patient's daughter also has a concussion so she is familiar with the symptoms. Patient will be discharged home with prescriptions for meclizine. Patient is to follow up with PCP as directed. Patient is given ED precautions to return to the ED for any worsening or new symptoms.   ____________________________________________  FINAL CLINICAL IMPRESSION(S) / ED DIAGNOSES  Final diagnoses:  Concussion without loss of consciousness, initial encounter      NEW MEDICATIONS STARTED DURING THIS VISIT:  Discharge Medication List as of 08/09/2016  7:39 AM    START taking these medications   Details  meclizine (ANTIVERT) 12.5 MG tablet Take 1 tablet (12.5 mg total) by mouth 3 (three) times daily as needed for dizziness., Starting Sat 08/09/2016, Print            This chart was dictated using voice recognition software/Dragon. Despite best efforts to proofread, errors can occur which can change the meaning. Any change was purely unintentional.    Laban Emperor, PA-C 08/09/16  0811    Laban Emperor, PA-C 08/09/16 0086    Schuyler Amor, MD 08/10/16 [phone]

## 2016-08-09 NOTE — ED Notes (Signed)
Pt provided with ice pack for hematoma and warm blankets. Explanation of ct process and delay provided to pt who verbalizes understanding. Pt in wheelchair out to lobby with spouse.

## 2016-08-27 ENCOUNTER — Ambulatory Visit (INDEPENDENT_AMBULATORY_CARE_PROVIDER_SITE_OTHER): Payer: Medicaid Other | Admitting: Obstetrics and Gynecology

## 2016-08-27 ENCOUNTER — Encounter: Payer: Self-pay | Admitting: Obstetrics and Gynecology

## 2016-08-27 VITALS — BP 103/69 | HR 76 | Ht 64.0 in | Wt 112.5 lb

## 2016-08-27 DIAGNOSIS — D229 Melanocytic nevi, unspecified: Secondary | ICD-10-CM

## 2016-08-27 DIAGNOSIS — L709 Acne, unspecified: Secondary | ICD-10-CM

## 2016-08-27 DIAGNOSIS — Z87898 Personal history of other specified conditions: Secondary | ICD-10-CM | POA: Diagnosis not present

## 2016-08-27 DIAGNOSIS — Z113 Encounter for screening for infections with a predominantly sexual mode of transmission: Secondary | ICD-10-CM | POA: Diagnosis not present

## 2016-08-27 DIAGNOSIS — Z01419 Encounter for gynecological examination (general) (routine) without abnormal findings: Secondary | ICD-10-CM

## 2016-08-27 DIAGNOSIS — Z87440 Personal history of urinary (tract) infections: Secondary | ICD-10-CM | POA: Diagnosis not present

## 2016-08-27 DIAGNOSIS — Z8742 Personal history of other diseases of the female genital tract: Secondary | ICD-10-CM

## 2016-08-27 DIAGNOSIS — Z124 Encounter for screening for malignant neoplasm of cervix: Secondary | ICD-10-CM

## 2016-08-27 MED ORDER — NORGESTIM-ETH ESTRAD TRIPHASIC 0.18/0.215/0.25 MG-25 MCG PO TABS: 1.0000 | ORAL_TABLET | Freq: Every day | ORAL | 11 refills | Status: DC

## 2016-08-27 NOTE — Progress Notes (Addendum)
Subjective:    Melissa Pruitt is a 41 y.o. female who presents for an annual exam.  The patient is sexually a ctive. The patient wears seatbelts: yes. The patient participates in regular exercise: yes. Has the patient ever been transfused or tattooed?: no. The patient reports that there is not domestic violence in her life.   The patient has the following complaints today: 1. Patient desires to have urine screened today as she has been treated recently for a UTI.  Also reports recent anaphylactic reaction to Macrobid several months ago requiring hospitalization.  2. Also desires to change birth control pills, as she is getting moderate acne (on back).  Has been going on x 1 year. Has tried several different washes and topical treatments for the acne with minimal relief.  Currently on OCPs due to perimenopausal symptoms, fibroid uterus and h/o dysfunctional uterine bleeding.  3. Patient complains of a mole on right vulva that appears to be changing in nature.  Notes that the borders have begun to become more irregular over the past few months.   Gynecologic History: Menarche age: 54 No LMP recorded. Patient is not currently having periods (Reason: Perimenopausal, on OCPs, notes skipped menses). 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 Last pap smear 03/2012 History of abnormal mammogram: no.  Last mammogram 06/13/2015.    Past Medical History:  Diagnosis Date  . Abnormal Pap smear of cervix   . Cervical cancer [facility])    removed in July 2009  . History of recurrent UTIs   . 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 Normocytic anemia 08/21/2014  . 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 Vitamin B 12 deficiency 08/21/2014    Family History  Problem Relation Age of Onset  . Hypertension Mother   . Cervical cancer Maternal Grandmother   .  Colon cancer Neg Hx   . Breast cancer Neg Hx     Past Surgical History:  Procedure Laterality Date  . BACK SURGERY    . BLADDER SURGERY    . COLOSTOMY     after flex sig as 41 year old.   . colostomy reversal    . EXPLORATORY LAPAROTOMY     with colostomy placement  . LEEP    . removal of cervical cancer    . TUBAL LIGATION      Social History   Social History  . Marital status: Legally Separated    Spouse name: N/A  . Number of children: 3  . Years of education: N/A   Occupational History  . disability Other   Social History Main Topics  . Smoking status: Former Smoker    Types: Cigarettes    Quit date: 07/14/2011  . Smokeless tobacco: Never Used     Comment: 1-3 cigarettes  . Alcohol use Yes     Comment: a beer here and there  . Drug use: No  . Sexual activity: Yes    Birth control/ protection: Pill   Other Topics Concern  . Not on file   Social History Narrative  . No narrative on file    Current Outpatient Prescriptions on File Prior to Visit  Medication Sig Dispense Refill  . diclofenac sodium (VOLTAREN) 1 % GEL Apply 1 application topically 4 (four) times daily as needed. arthritis    . fluticasone (FLONASE) 50  MCG/ACT nasal spray Place 2 sprays into the nose daily.    Marland Kitchen lidocaine (LIDODERM) 5 % Place 1 patch onto the skin daily. Remove & Discard patch within 12 hours or as directed by MD     . loratadine (CLARITIN) 10 MG tablet Take 10 mg by mouth daily.      . methocarbamol (ROBAXIN) 750 MG tablet Take 750 mg by mouth 3 (three) times daily.    . montelukast (SINGULAIR) 10 MG tablet Take 10 mg by mouth at bedtime.    . norethindrone (MICRONOR,CAMILA,ERRIN) 0.35 MG tablet Take 1 tablet by mouth daily.     No current facility-administered medications on file prior to visit.     Allergies  Allergen Reactions  . Ciprofloxacin Anaphylaxis    Heart and nerve issues  . Macrobid [Nitrofurantoin Monohyd Macro] Anaphylaxis, Hives and Shortness Of Breath  .  Morphine And Related Anaphylaxis  . Azithromycin Nausea And Vomiting  . Dilaudid [Hydromorphone Hcl] Other (See Comments)    Makes patient feel like she out of it  . Lactose     Other reaction(s): Other (See Comments) Digestive issues/pain  . Prednisolone Other (See Comments)    Chest pain, left arm and hand tingling, eye pain, and mania  . Prednisone Other (See Comments)  . Sulfa Antibiotics Nausea And Vomiting  . Tramadol Hcl     REACTION: HEART PALPITATIONS /SWELLING /SOB  . Oxcarbazepine Rash  . Pork-Derived Products Nausea And Vomiting     Review of Systems Constitutional: negative for chills, fatigue, fevers and sweats Eyes: negative for irritation, redness and visual disturbance Ears, nose, mouth, throat, and face: negative for hearing loss, nasal congestion, snoring and tinnitus Respiratory: negative for asthma, cough, sputum Cardiovascular: negative for chest pain, dyspnea, exertional chest pressure/discomfort, irregular heart beat, palpitations and syncope Gastrointestinal: negative for abdominal pain, change in bowel habits, nausea and vomiting Genitourinary: negative for abnormal menstrual periods, genital lesions, sexual problems and vaginal discharge, dysuria and urinary incontinence Integument/breast: negative for breast lump, breast tenderness and nipple discharge Hematologic/lymphatic: negative for bleeding and easy bruising Musculoskeletal:negative for back pain and muscle weakness Neurological: negative for dizziness, headaches, vertigo and weakness Endocrine: negative for diabetic symptoms including polydipsia, polyuria and skin dryness Allergic/Immunologic: negative for hay fever and urticaria       Objective:   BP 103/69 (BP Location: Left Arm, Patient Position: Sitting, Cuff Size: Normal)   Pulse 76   Ht 5\' 4"  (1.626 m)   Wt 112 lb 8 oz (51 kg)   BMI 19.31 kg/m   General Appearance:    Alert, cooperative, no distress, appears stated age  Head:     Normocephalic, without obvious abnormality, atraumatic  Eyes:    PERRL, conjunctiva/corneas clear, EOM's intact, both eyes  Ears:    Normal TM's and external ear canals, both ears  Nose:   Nares normal, septum midline, mucosa normal, no drainage or sinus tenderness  Throat:   Lips, mucosa, and tongue normal; teeth and gums normal  Neck:   Supple, symmetrical, trachea midline, no adenopathy;   thyroid:  no enlargement/tenderness/nodules; no carotid bruit or JVD  Back:     Symmetric, no curvature, ROM normal, no CVA tenderness  Lungs:     Clear to auscultation bilaterally, respirations unlabored   Chest Wall:    No tenderness or deformity.    Heart:    Regular rate and rhythm, S1 and S2 normal, no murmur, rub   or gallop  Breast Exam:    No  tenderness, masses, or nipple abnormality  Abdomen:     Soft, non-tender, bowel sounds active all four quadrants,   no masses, no organomegaly  Genitalia:    Pelvic: external genitalia normal, except small brown-black mole with irregular contours, ~ rectovaginal septum normal.  Urethral cyst (~ 2 cm) present, visible at introitus. Vagina without vaginal discharge.  Cervix with post-LEEP changes, no lesions and no motion tenderness.  Uterus mobile, nontender, normal shape and size.  Adnexae non-palpable, nontender bilaterally.   Rectal:    Normal tone, no masses or tenderness;   guaiac negative stool  Extremities:   Extremities normal, atraumatic, no cyanosis or edema  Pulses:   2+ and symmetric all extremities  Skin:   Skin color, texture, turgor normal, no rashes or lesions  Lymph nodes:   Cervical, supraclavicular, and axillary nodes normal  Neurologic:   CNII-XII intact, normal strength, sensation and reflexes throughout     Labs:  Lab Results  Component Value Date   WBC 6.5 07/19/2016   HGB 13.3 07/19/2016   HCT 39.4 07/19/2016   MCV 89.0 07/19/2016   PLT 225 07/19/2016    Lab Results  Component Value Date   CREATININE 0.67 07/19/2016   BUN 11  07/19/2016   NA 138 07/19/2016   K 3.3 (L) 07/19/2016   CL 104 07/19/2016   CO2 27 07/19/2016    Lab Results  Component Value Date   ALT 9 (L) 07/19/2016   AST 15 07/19/2016   ALKPHOS 28 (L) 07/19/2016   BILITOT 0.7 07/19/2016    No results found for: TSH  No results found for: CHOL, HDL, LDLCALC, LDLDIRECT, TRIG, CHOLHDL  (Patient notes she has had done within the past 5 years)  Assessment:   Routine gynecologic exam STD screen H/o UTIs (recurrent) Perimenopausal  Acne of back History of abnormal pap smear (with LEEP) Skin mole  Plan:   Blood tests: None ordered. Breast self exam technique reviewed and patient encouraged to perform self-exam monthly. Is scheduled for a mammogram next week.  Desires GC/Cl to be performed today.  Ordered with pap smear.  Discussed safe sex practices.  Pap smear performed.  UA ordered to assess for resolution of recent UTI at patient's request.  Contraception: tubal ligation. Discussed healthy lifestyle modifications. Patient currently on OCPs for perimenopausal symptoms with h/o fibroid uterus and abnormal bleeding. Desires to switch brand due to moderate back acne.  Is currently on Errin (progesterone only), was on Pirmella in the past. Discussed need for lowering estrogen dose due to age will change to Tri-Sprintec Lo.   Referral to Dermatology placed for skin mole changes (on right vulva).  Follow up in 1 year, or sooner as needed. Rubie Maid, MD [facility]

## 2016-08-28 ENCOUNTER — Inpatient Hospital Stay: Payer: Medicaid Other

## 2016-08-28 ENCOUNTER — Inpatient Hospital Stay: Payer: Medicaid Other | Attending: Internal Medicine

## 2016-08-28 DIAGNOSIS — E538 Deficiency of other specified B group vitamins: Secondary | ICD-10-CM | POA: Insufficient documentation

## 2016-08-28 DIAGNOSIS — Z79899 Other long term (current) drug therapy: Secondary | ICD-10-CM | POA: Insufficient documentation

## 2016-08-28 LAB — URINALYSIS, ROUTINE W REFLEX MICROSCOPIC
BILIRUBIN UA: NEGATIVE
GLUCOSE, UA: NEGATIVE
KETONES UA: NEGATIVE
Leukocytes, UA: NEGATIVE
NITRITE UA: NEGATIVE
Protein, UA: NEGATIVE
RBC UA: NEGATIVE
Specific Gravity, UA: 1.016 (ref 1.005–1.030)
Urobilinogen, Ur: 0.2 mg/dL (ref 0.2–1.0)
pH, UA: 6.5 (ref 5.0–7.5)

## 2016-08-29 LAB — PAP IG, CT-NG NAA, HPV HIGH-RISK
Chlamydia, Nuc. Acid Amp: NEGATIVE
GONOCOCCUS BY NUCLEIC ACID AMP: NEGATIVE
HPV, high-risk: NEGATIVE
PAP SMEAR COMMENT: 0

## 2016-08-30 LAB — URINE CULTURE

## 2016-09-02 ENCOUNTER — Telehealth: Payer: Self-pay

## 2016-09-02 DIAGNOSIS — N390 Urinary tract infection, site not specified: Secondary | ICD-10-CM

## 2016-09-02 MED ORDER — AMPICILLIN 250 MG PO CAPS: 250.0000 mg | ORAL_CAPSULE | Freq: Three times a day (TID) | ORAL | 0 refills | Status: DC

## 2016-09-02 NOTE — Telephone Encounter (Signed)
-----   Message from Rubie Maid, MD sent at 09/01/2016  4:11 PM EDT ----- Patient with bacteriuria (not quite a UTI), but can treat with ampicillin 250 mg TID x 7 days if symptomatic.

## 2016-09-02 NOTE — Telephone Encounter (Signed)
Called pt informed her of results below. RX sent in.

## 2016-09-04 ENCOUNTER — Inpatient Hospital Stay: Payer: Medicaid Other

## 2016-09-04 DIAGNOSIS — Z79899 Other long term (current) drug therapy: Secondary | ICD-10-CM | POA: Diagnosis not present

## 2016-09-04 DIAGNOSIS — E538 Deficiency of other specified B group vitamins: Secondary | ICD-10-CM

## 2016-09-04 MED ORDER — CYANOCOBALAMIN 1000 MCG/ML IJ SOLN
1000.0000 ug | Freq: Once | INTRAMUSCULAR | Status: AC
  Administered 2016-09-04: 1000 ug via INTRAMUSCULAR
  Filled 2016-09-04: qty 1

## 2016-09-25 ENCOUNTER — Inpatient Hospital Stay: Payer: Medicaid Other

## 2016-10-02 ENCOUNTER — Inpatient Hospital Stay: Payer: Medicaid Other

## 2016-10-07 ENCOUNTER — Inpatient Hospital Stay: Payer: Medicaid Other | Attending: Internal Medicine

## 2016-10-07 DIAGNOSIS — Z79899 Other long term (current) drug therapy: Secondary | ICD-10-CM | POA: Insufficient documentation

## 2016-10-07 DIAGNOSIS — E538 Deficiency of other specified B group vitamins: Secondary | ICD-10-CM | POA: Insufficient documentation

## 2016-10-07 MED ORDER — CYANOCOBALAMIN 1000 MCG/ML IJ SOLN
1000.0000 ug | Freq: Once | INTRAMUSCULAR | Status: AC
  Administered 2016-10-07: 1000 ug via INTRAMUSCULAR
  Filled 2016-10-07: qty 1

## 2016-10-23 ENCOUNTER — Inpatient Hospital Stay: Payer: Medicaid Other

## 2016-10-23 ENCOUNTER — Ambulatory Visit: Payer: Medicaid Other

## 2016-10-28 ENCOUNTER — Emergency Department
Admission: EM | Admit: 2016-10-28 | Discharge: 2016-10-28 | Disposition: A | Discharge status: Home / Self Care | Payer: Medicaid Other | Attending: Emergency Medicine | Admitting: Emergency Medicine

## 2016-10-28 DIAGNOSIS — R350 Frequency of micturition: Secondary | ICD-10-CM | POA: Diagnosis present

## 2016-10-28 DIAGNOSIS — Z87891 Personal history of nicotine dependence: Secondary | ICD-10-CM | POA: Insufficient documentation

## 2016-10-28 DIAGNOSIS — R51 Headache: Secondary | ICD-10-CM | POA: Diagnosis not present

## 2016-10-28 DIAGNOSIS — R300 Dysuria: Secondary | ICD-10-CM | POA: Diagnosis not present

## 2016-10-28 DIAGNOSIS — R0981 Nasal congestion: Secondary | ICD-10-CM

## 2016-10-28 DIAGNOSIS — Z79899 Other long term (current) drug therapy: Secondary | ICD-10-CM | POA: Diagnosis not present

## 2016-10-28 LAB — URINALYSIS, COMPLETE (UACMP) WITH MICROSCOPIC
BILIRUBIN URINE: NEGATIVE
Bacteria, UA: NONE SEEN
Glucose, UA: NEGATIVE mg/dL
HGB URINE DIPSTICK: NEGATIVE
KETONES UR: NEGATIVE mg/dL
NITRITE: NEGATIVE
PH: 5 (ref 5.0–8.0)
Protein, ur: NEGATIVE mg/dL
SPECIFIC GRAVITY, URINE: 1.019 (ref 1.005–1.030)

## 2016-10-28 MED ORDER — CEPHALEXIN 500 MG PO CAPS: 500.0000 mg | ORAL_CAPSULE | Freq: Three times a day (TID) | ORAL | 0 refills | Status: AC

## 2016-10-28 MED ORDER — HYDROCODONE-ACETAMINOPHEN 5-325 MG PO TABS: 1.0000 | ORAL_TABLET | Freq: Four times a day (QID) | ORAL | 0 refills | Status: DC | PRN

## 2016-10-28 MED ORDER — IPRATROPIUM BROMIDE 0.06 % NA SOLN: 2.0000 | Freq: Three times a day (TID) | NASAL | 2 refills | Status: DC

## 2016-10-28 NOTE — ED Provider Notes (Signed)
[facility] Emergency Department Provider Note  ____________________________________________   First MD Initiated Contact with Patient 10/28/16 2008     (approximate)  I have reviewed the triage vital signs and the nursing notes.   HISTORY  Chief Complaint Urinary Frequency and Headache    HPI Melissa Pruitt is a 41 y.o. female who self presents to the emergency department with 2 issues. First she has to self catheter 6 times a day secondary to spina bifida and she has noted dysuria and is worried that she has urinary tract infection. She denies fevers or chills. She denies back pain. She also reports 2-3 days of painful swelling in her right maxillary sinus.   Past Medical History:  Diagnosis Date  . Abnormal Pap smear of cervix   . Cervical cancer [facility])    removed in July 2009  . History of recurrent UTIs   . HPV (human papilloma virus) infection   . HSV (herpes simplex virus) infection   . 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 Normocytic anemia 08/21/2014  . 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 Vitamin B 12 deficiency 08/21/2014    Patient Active Problem List   Diagnosis Date Noted  . Normocytic anemia 08/21/2014  . Vitamin B 12 deficiency 08/21/2014  . Cervical pain 08/25/2012  . Pain in joint, pelvic region and thigh 07/16/2012  . 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 Balance disorder 07/16/2012  . Personal history of fall 07/16/2012  . 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 [place] SP CORD&NERV SYSTEM 05/08/2010  . H N P-LUMBAR 04/23/2010  . BACK PAIN 01/28/2010    Past Surgical History:  Procedure Laterality Date  . BACK SURGERY    . BLADDER SURGERY    . COLOSTOMY     after flex sig as 41 year old.   . colostomy reversal    . EXPLORATORY LAPAROTOMY     with colostomy placement  . LEEP    .  removal of cervical cancer    . TUBAL LIGATION      Prior to Admission medications   Medication Sig Start Date End Date Taking? Authorizing Provider  ampicillin (PRINCIPEN) 250 MG capsule Take 1 capsule (250 mg total) by mouth 3 (three) times daily. 09/02/16   Rubie Maid, MD  cephALEXin (KEFLEX) 500 MG capsule Take 1 capsule (500 mg total) by mouth 3 (three) times daily. 10/28/16 11/04/16  Darel Hong, MD  cyanocobalamin (,VITAMIN B-12,) 1000 MCG/ML injection Inject into the muscle.    [provider]  diclofenac sodium (VOLTAREN) 1 % GEL Apply 1 application topically 4 (four) times daily as needed. arthritis    [provider]  EPINEPHrine (EPIPEN 2-PAK) 0.3 mg/0.3 mL IJ SOAJ injection Inject into the muscle once.    [provider]  fluticasone (FLONASE) 50 MCG/ACT nasal spray Place 2 sprays into the nose daily.    [provider]  HYDROcodone-acetaminophen (NORCO) 5-325 MG tablet Take 1 tablet by mouth every 6 (six) hours as needed for severe pain. 10/28/16   Darel Hong, MD  ipratropium (ATROVENT) 0.06 % nasal spray Place 2 sprays into the nose 3 (three) times daily. 10/28/16 10/28/17  Darel Hong, MD  lidocaine (LIDODERM) 5 % Place 1 patch onto the skin daily. Remove & Discard patch within 12 hours or as directed by MD     [provider]  loratadine (CLARITIN) 10 MG tablet Take 10 mg by mouth daily.      [provider]  methocarbamol (ROBAXIN) 750 MG tablet Take 750 mg by mouth 3 (three) times daily.    [provider]  montelukast (SINGULAIR) 10 MG tablet Take 10 mg by mouth at bedtime.    [provider]  norethindrone (MICRONOR,CAMILA,ERRIN) 0.35 MG tablet Take 1 tablet by mouth daily.    [provider]  Norgestimate-Ethinyl Estradiol Triphasic (TRI-LO-SPRINTEC) 0.18/0.215/0.25 MG-25 MCG tab Take 1 tablet by mouth daily. 08/27/16   Rubie Maid, MD    Allergies Ciprofloxacin; Macrobid  [nitrofurantoin monohyd macro]; Morphine and related; Azithromycin; Dilaudid [hydromorphone hcl]; Lactose; Prednisolone; Prednisone; Sulfa antibiotics; Tramadol hcl; Oxcarbazepine; and Pork-derived products  Family History  Problem Relation Age of Onset  . Hypertension Mother   . Cervical cancer Maternal Grandmother   . Colon cancer Neg Hx   . Breast cancer Neg Hx     Social History Social History  Substance Use Topics  . Smoking status: Former Smoker    Types: Cigarettes    Quit date: 07/14/2011  . Smokeless tobacco: Never Used     Comment: 1-3 cigarettes  . Alcohol use Yes     Comment: a beer here and there    Review of Systems Constitutional: No fever/chills ENT: No sore throat. Cardiovascular: Denies chest pain. Respiratory: Denies shortness of breath. Gastrointestinal: No abdominal pain.  No nausea, no vomiting.  No diarrhea.  No constipation. Musculoskeletal: Negative for back pain. Neurological: Negative for headaches   ____________________________________________   PHYSICAL EXAM:  VITAL SIGNS: ED Triage Vitals  Enc Vitals Group     BP 10/28/16 1740 (!) 114/99     Pulse Rate 10/28/16 1740 74     Resp 10/28/16 1740 16     Temp 10/28/16 1740 98.8 F (37.1 C)     Temp Source 10/28/16 1740 Oral     SpO2 10/28/16 1740 99 %     Weight 10/28/16 1740 116 lb (52.6 kg)     Height 10/28/16 1740 5\' 4"  (1.626 m)     Head Circumference --      Peak Flow --      Pain Score 10/28/16 1749 10     Pain Loc --      Pain Edu? --      Excl. in [place]? --     Constitutional: Alert and oriented 4 appropriate cooperative speaks in full clear sentences Head: Atraumatic. Nose: No congestion/rhinnorhea. Mouth/Throat: No trismus uvula midline no pharyngeal erythema or exudate no dental tenderness she is somewhat tender over her right maxillary sinus Neck: No stridor.   Cardiovascular: Regular rate and rhythm Respiratory: Normal respiratory effort.  No  retractions. Gastrointestinal: Soft nontender Neurologic:  Normal speech and language. Skin:  Skin is warm, dry and intact. No rash noted.    ____________________________________________  LABS (all labs ordered are listed, but only abnormal results are displayed)  Labs Reviewed  URINALYSIS, COMPLETE (UACMP) WITH MICROSCOPIC - Abnormal; Notable for the following:       Result Value   Color, Urine YELLOW (*)    APPearance CLEAR (*)    Leukocytes, UA SMALL (*)    Squamous Epithelial / LPF 0-5 (*)    All other components within normal limits  URINE CULTURE    No evidence of urinary tract infection __________________________________________  EKG   ____________________________________________  RADIOLOGY   ____________________________________________   PROCEDURES  Procedure(s) performed: no  Procedures  Critical  Care performed: no  Observation: no ____________________________________________   INITIAL IMPRESSION / ASSESSMENT AND PLAN / ED COURSE  Pertinent labs & imaging results that were available during my care of the patient were reviewed by me and considered in my medical decision making (see chart for details).  While the patient's urinalysis is negative today she does report dysuria and has a frequent history of urinary tract infection secondary to self cathetering. This urinalysis may represent a false negatives so we'll treat her with Keflex pending the results of her urine culture. Flexion also cover any sinus infection that she has this point. I've encouraged her to continue her follow-up with her dentist and her primary care as scheduled. She is discharged home in improved condition.      ____________________________________________   FINAL CLINICAL IMPRESSION(S) / ED DIAGNOSES  Final diagnoses:  Dysuria  Sinus congestion      NEW MEDICATIONS STARTED DURING THIS VISIT:  Discharge Medication List as of 10/28/2016  8:18 PM    START taking  these medications   Details  cephALEXin (KEFLEX) 500 MG capsule Take 1 capsule (500 mg total) by mouth 3 (three) times daily., Starting Tue 10/28/2016, Until Tue 11/04/2016, Print    HYDROcodone-acetaminophen (NORCO) 5-325 MG tablet Take 1 tablet by mouth every 6 (six) hours as needed for severe pain., Starting Tue 10/28/2016, Print    ipratropium (ATROVENT) 0.06 % nasal spray Place 2 sprays into the nose 3 (three) times daily., Starting Tue 10/28/2016, Until Wed 10/28/2017, Print         Note:  This document was prepared using Dragon voice recognition software and may include unintentional dictation errors.      Darel Hong, MD 10/29/16 1433

## 2016-10-28 NOTE — Discharge Instructions (Signed)
Please keep your appointment to see her primary care physician on July 25th as scheduled and your dental appointment on August 8 as scheduled. Take all of your antibiotics as prescribed and return to the emergency department for any concerns.  It was a pleasure to take care of you today, and thank you for coming to our emergency department.  If you have any questions or concerns before leaving please ask the nurse to grab me and I'm more than happy to go through your aftercare instructions again.  If you were prescribed any opioid pain medication today such as Norco, Vicodin, Percocet, morphine, hydrocodone, or oxycodone please make sure you do not drive when you are taking this medication as it can alter your ability to drive safely.  If you have any concerns once you are home that you are not improving or are in fact getting worse before you can make it to your follow-up appointment, please do not hesitate to call 911 and come back for further evaluation.  Darel Hong MD  Results for orders placed or performed during the hospital encounter of 10/28/16  Urinalysis, Complete w Microscopic  Result Value Ref Range   Color, Urine YELLOW (A) YELLOW   APPearance CLEAR (A) CLEAR   Specific Gravity, Urine 1.019 1.005 - 1.030   pH 5.0 5.0 - 8.0   Glucose, UA NEGATIVE NEGATIVE mg/dL   Hgb urine dipstick NEGATIVE NEGATIVE   Bilirubin Urine NEGATIVE NEGATIVE   Ketones, ur NEGATIVE NEGATIVE mg/dL   Protein, ur NEGATIVE NEGATIVE mg/dL   Nitrite NEGATIVE NEGATIVE   Leukocytes, UA SMALL (A) NEGATIVE   RBC / HPF 0-5 0 - 5 RBC/hpf   WBC, UA 6-30 0 - 5 WBC/hpf   Bacteria, UA NONE SEEN NONE SEEN   Squamous Epithelial / LPF 0-5 (A) NONE SEEN   Mucous PRESENT

## 2016-10-28 NOTE — ED Triage Notes (Signed)
Pt has to self 6 times a day due to tethered spinal cord from spinal bifida and feels like she has a UTI, pt also c/o sinus or toothache, states it feels like a "mack truck on the right side of her face".Marland Kitchen

## 2016-10-30 ENCOUNTER — Inpatient Hospital Stay: Payer: Medicaid Other

## 2016-11-01 LAB — URINE CULTURE

## 2016-11-06 ENCOUNTER — Inpatient Hospital Stay: Payer: Medicaid Other | Attending: Internal Medicine

## 2016-11-06 DIAGNOSIS — E538 Deficiency of other specified B group vitamins: Secondary | ICD-10-CM

## 2016-11-06 DIAGNOSIS — Z79899 Other long term (current) drug therapy: Secondary | ICD-10-CM | POA: Insufficient documentation

## 2016-11-06 MED ORDER — CYANOCOBALAMIN 1000 MCG/ML IJ SOLN
1000.0000 ug | Freq: Once | INTRAMUSCULAR | Status: AC
  Administered 2016-11-06: 1000 ug via INTRAMUSCULAR
  Filled 2016-11-06: qty 1

## 2016-11-25 ENCOUNTER — Other Ambulatory Visit: Payer: Medicaid Other

## 2016-11-27 ENCOUNTER — Inpatient Hospital Stay: Payer: Medicaid Other

## 2016-12-01 ENCOUNTER — Inpatient Hospital Stay: Payer: Medicaid Other | Attending: Internal Medicine

## 2016-12-01 DIAGNOSIS — R531 Weakness: Secondary | ICD-10-CM | POA: Diagnosis not present

## 2016-12-01 DIAGNOSIS — E538 Deficiency of other specified B group vitamins: Secondary | ICD-10-CM

## 2016-12-01 DIAGNOSIS — Z8541 Personal history of malignant neoplasm of cervix uteri: Secondary | ICD-10-CM | POA: Insufficient documentation

## 2016-12-01 DIAGNOSIS — G8929 Other chronic pain: Secondary | ICD-10-CM | POA: Diagnosis not present

## 2016-12-01 DIAGNOSIS — Z79899 Other long term (current) drug therapy: Secondary | ICD-10-CM | POA: Diagnosis not present

## 2016-12-01 DIAGNOSIS — Z87891 Personal history of nicotine dependence: Secondary | ICD-10-CM | POA: Insufficient documentation

## 2016-12-01 DIAGNOSIS — M549 Dorsalgia, unspecified: Secondary | ICD-10-CM | POA: Diagnosis not present

## 2016-12-01 DIAGNOSIS — Z87440 Personal history of urinary (tract) infections: Secondary | ICD-10-CM | POA: Diagnosis not present

## 2016-12-01 DIAGNOSIS — Q059 Spina bifida, unspecified: Secondary | ICD-10-CM | POA: Diagnosis not present

## 2016-12-01 LAB — CBC WITH DIFFERENTIAL/PLATELET
BASOS PCT: 1 %
Basophils Absolute: 0.1 10*3/uL (ref 0–0.1)
Eosinophils Absolute: 0.2 10*3/uL (ref 0–0.7)
Eosinophils Relative: 3 %
HCT: 34.8 % — ABNORMAL LOW (ref 35.0–47.0)
Hemoglobin: 12.2 g/dL (ref 12.0–16.0)
LYMPHS ABS: 2.1 10*3/uL (ref 1.0–3.6)
Lymphocytes Relative: 38 %
MCH: 31.1 pg (ref 26.0–34.0)
MCHC: 35 g/dL (ref 32.0–36.0)
MCV: 88.9 fL (ref 80.0–100.0)
MONO ABS: 0.3 10*3/uL (ref 0.2–0.9)
MONOS PCT: 6 %
NEUTROS ABS: 2.9 10*3/uL (ref 1.4–6.5)
Neutrophils Relative %: 52 %
Platelets: 233 10*3/uL (ref 150–440)
RBC: 3.91 MIL/uL (ref 3.80–5.20)
RDW: 12.7 % (ref 11.5–14.5)
WBC: 5.6 10*3/uL (ref 3.6–11.0)

## 2016-12-02 ENCOUNTER — Ambulatory Visit: Payer: Medicaid Other

## 2016-12-02 ENCOUNTER — Other Ambulatory Visit: Payer: Medicaid Other

## 2016-12-02 ENCOUNTER — Ambulatory Visit: Payer: Medicaid Other | Admitting: Internal Medicine

## 2016-12-08 ENCOUNTER — Inpatient Hospital Stay: Payer: Medicaid Other

## 2016-12-08 ENCOUNTER — Inpatient Hospital Stay: Payer: Medicaid Other | Admitting: Internal Medicine

## 2016-12-09 ENCOUNTER — Ambulatory Visit: Payer: Medicaid Other | Admitting: Internal Medicine

## 2016-12-09 ENCOUNTER — Ambulatory Visit: Payer: Medicaid Other

## 2016-12-09 DIAGNOSIS — W5911XA Bitten by nonvenomous snake, initial encounter: Secondary | ICD-10-CM | POA: Diagnosis not present

## 2016-12-09 DIAGNOSIS — Z79899 Other long term (current) drug therapy: Secondary | ICD-10-CM | POA: Diagnosis not present

## 2016-12-09 DIAGNOSIS — Y9301 Activity, walking, marching and hiking: Secondary | ICD-10-CM | POA: Insufficient documentation

## 2016-12-09 DIAGNOSIS — Z87891 Personal history of nicotine dependence: Secondary | ICD-10-CM | POA: Insufficient documentation

## 2016-12-09 DIAGNOSIS — Y929 Unspecified place or not applicable: Secondary | ICD-10-CM | POA: Insufficient documentation

## 2016-12-09 DIAGNOSIS — Y999 Unspecified external cause status: Secondary | ICD-10-CM | POA: Insufficient documentation

## 2016-12-09 DIAGNOSIS — M79662 Pain in left lower leg: Secondary | ICD-10-CM | POA: Diagnosis present

## 2016-12-09 DIAGNOSIS — S81832A Puncture wound without foreign body, left lower leg, initial encounter: Secondary | ICD-10-CM | POA: Diagnosis not present

## 2016-12-09 LAB — BASIC METABOLIC PANEL
Anion gap: 7 (ref 5–15)
BUN: 9 mg/dL (ref 6–20)
CHLORIDE: 103 mmol/L (ref 101–111)
CO2: 26 mmol/L (ref 22–32)
Calcium: 9.1 mg/dL (ref 8.9–10.3)
Creatinine, Ser: 0.63 mg/dL (ref 0.44–1.00)
GFR calc Af Amer: >60 mL/min (ref >60)
GFR calc non Af Amer: >60 mL/min (ref >60)
Glucose, Bld: 90 mg/dL (ref 65–99)
POTASSIUM: 4 mmol/L (ref 3.5–5.1)
SODIUM: 136 mmol/L (ref 135–145)

## 2016-12-09 LAB — CBC
HEMATOCRIT: 38.6 % (ref 35.0–47.0)
HEMOGLOBIN: 13 g/dL (ref 12.0–16.0)
MCH: 30.5 pg (ref 26.0–34.0)
MCHC: 33.7 g/dL (ref 32.0–36.0)
MCV: 90.4 fL (ref 80.0–100.0)
Platelets: 265 10*3/uL (ref 150–440)
RBC: 4.26 MIL/uL (ref 3.80–5.20)
RDW: 13.1 % (ref 11.5–14.5)
WBC: 5.2 10*3/uL (ref 3.6–11.0)

## 2016-12-09 LAB — PROTIME-INR
INR: 0.9
PROTHROMBIN TIME: 12.1 s (ref 11.4–15.2)

## 2016-12-09 NOTE — ED Triage Notes (Signed)
Patient presents to ED with two marks on the back of her ankle that her daughter noticed and says it could be a snake bite. Says she has peripheral neuropathy and would "never had known". There are two scabs noted to the left posterolateral ankle. There is no swelling noted to the foot or the leg but slight swelling noted to the area of the scabs. Three days ago she said her daughter noted her ankle was swollen and she has pain "on the inside of her leg." No swelling of the calf or ankle noted.

## 2016-12-10 ENCOUNTER — Emergency Department
Admission: EM | Admit: 2016-12-10 | Discharge: 2016-12-10 | Disposition: A | Discharge status: Home / Self Care | Payer: Medicaid Other | Attending: Emergency Medicine | Admitting: Emergency Medicine

## 2016-12-10 ENCOUNTER — Inpatient Hospital Stay (HOSPITAL_BASED_OUTPATIENT_CLINIC_OR_DEPARTMENT_OTHER): Payer: Medicaid Other | Admitting: Internal Medicine

## 2016-12-10 ENCOUNTER — Inpatient Hospital Stay: Payer: Medicaid Other

## 2016-12-10 VITALS — BP 133/74 | HR 64 | Temp 97.3°F | Resp 16 | Wt 110.8 lb

## 2016-12-10 DIAGNOSIS — M549 Dorsalgia, unspecified: Secondary | ICD-10-CM

## 2016-12-10 DIAGNOSIS — Z79899 Other long term (current) drug therapy: Secondary | ICD-10-CM | POA: Diagnosis not present

## 2016-12-10 DIAGNOSIS — W5911XA Bitten by nonvenomous snake, initial encounter: Secondary | ICD-10-CM

## 2016-12-10 DIAGNOSIS — Z87891 Personal history of nicotine dependence: Secondary | ICD-10-CM | POA: Diagnosis not present

## 2016-12-10 DIAGNOSIS — Z8541 Personal history of malignant neoplasm of cervix uteri: Secondary | ICD-10-CM | POA: Diagnosis not present

## 2016-12-10 DIAGNOSIS — Z87440 Personal history of urinary (tract) infections: Secondary | ICD-10-CM | POA: Diagnosis not present

## 2016-12-10 DIAGNOSIS — Q059 Spina bifida, unspecified: Secondary | ICD-10-CM | POA: Diagnosis not present

## 2016-12-10 DIAGNOSIS — E538 Deficiency of other specified B group vitamins: Secondary | ICD-10-CM

## 2016-12-10 DIAGNOSIS — R531 Weakness: Secondary | ICD-10-CM

## 2016-12-10 DIAGNOSIS — G8929 Other chronic pain: Secondary | ICD-10-CM | POA: Diagnosis not present

## 2016-12-10 MED ORDER — CYANOCOBALAMIN 1000 MCG/ML IJ SOLN
1000.0000 ug | INTRAMUSCULAR | Status: DC
Start: 2016-12-10 — End: 2016-12-10
  Administered 2016-12-10: 1000 ug via INTRAMUSCULAR

## 2016-12-10 MED ORDER — CYANOCOBALAMIN 1000 MCG/ML IJ SOLN
1000.0000 ug | Freq: Once | INTRAMUSCULAR | Status: DC
  Filled 2016-12-10: qty 1

## 2016-12-10 MED ORDER — CEPHALEXIN 500 MG PO CAPS
500.0000 mg | ORAL_CAPSULE | Freq: Once | ORAL | Status: AC
  Administered 2016-12-10: 500 mg via ORAL
  Filled 2016-12-10: qty 1

## 2016-12-10 MED ORDER — CEPHALEXIN 500 MG PO CAPS: 500.0000 mg | ORAL_CAPSULE | Freq: Two times a day (BID) | ORAL | 0 refills | Status: AC

## 2016-12-10 NOTE — ED Provider Notes (Signed)
[facility] Emergency Department Provider Note   First MD Initiated Contact with Patient 12/10/16 0101     (approximate)  I have reviewed the triage vital signs and the nursing notes.   HISTORY  Chief Complaint Insect Bite (Questions if it is a snake bite ? )   HPI Melissa Pruitt is a 41 y.o. female with bolus of chronic medical conditions including peripheral neuropathy presents to the emergency department with history of possible snake bite to the left lower leg may have occurred 3 days ago. The patient's daughter stated that she noted the area. Patient states that she has very minimal sensation to the lower extremities and a such would not be aware she was indeed bitten by a snake. Patient states that she has been on trials hiking and a such as concern. Patient's daughter stated that redness and scant drainage have been noted from the wound today.   Past Medical History:  Diagnosis Date  . Abnormal Pap smear of cervix   . Cervical cancer [facility])    removed in July 2009  . History of recurrent UTIs   . HPV (human papilloma virus) infection   . HSV (herpes simplex virus) infection   . 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 Normocytic anemia 08/21/2014  . 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 Vitamin B 12 deficiency 08/21/2014    Patient Active Problem List   Diagnosis Date Noted  . Normocytic anemia 08/21/2014  . Vitamin B 12 deficiency 08/21/2014  . Cervical pain 08/25/2012  . Pain in joint, pelvic region and thigh 07/16/2012  . 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 Balance disorder 07/16/2012  . Personal history of fall 07/16/2012  . 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([place]) 12/23/2010  . 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 [place] SP CORD&NERV SYSTEM 05/08/2010  . H N P-LUMBAR 04/23/2010  . BACK PAIN 01/28/2010    Past Surgical History:  Procedure Laterality  Date  . BACK SURGERY    . BLADDER SURGERY    . COLOSTOMY     after flex sig as 41 year old.   . colostomy reversal    . EXPLORATORY LAPAROTOMY     with colostomy placement  . LEEP    . removal of cervical cancer    . TUBAL LIGATION      Prior to Admission medications   Medication Sig Start Date End Date Taking? Authorizing Provider  ampicillin (PRINCIPEN) 250 MG capsule Take 1 capsule (250 mg total) by mouth 3 (three) times daily. 09/02/16   Rubie Maid, MD  cephALEXin (KEFLEX) 500 MG capsule Take 1 capsule (500 mg total) by mouth 2 (two) times daily. 12/10/16 12/20/16  Gregor Hams, MD  cyanocobalamin (,VITAMIN B-12,) 1000 MCG/ML injection Inject into the muscle.    [provider]  diclofenac sodium (VOLTAREN) 1 % GEL Apply 1 application topically 4 (four) times daily as needed. arthritis    [provider]  EPINEPHrine (EPIPEN 2-PAK) 0.3 mg/0.3 mL IJ SOAJ injection Inject into the muscle once.    [provider]  fluticasone (FLONASE) 50 MCG/ACT nasal spray Place 2 sprays into the nose daily.    [provider]  HYDROcodone-acetaminophen (NORCO) 5-325 MG tablet Take 1 tablet by mouth every 6 (six) hours as needed for severe pain. 10/28/16   Darel Hong, MD  ipratropium (ATROVENT) 0.06 % nasal spray Place 2 sprays into the nose  3 (three) times daily. 10/28/16 10/28/17  Darel Hong, MD  lidocaine (LIDODERM) 5 % Place 1 patch onto the skin daily. Remove & Discard patch within 12 hours or as directed by MD     [provider]  loratadine (CLARITIN) 10 MG tablet Take 10 mg by mouth daily.      [provider]  methocarbamol (ROBAXIN) 750 MG tablet Take 750 mg by mouth 3 (three) times daily.    [provider]  montelukast (SINGULAIR) 10 MG tablet Take 10 mg by mouth at bedtime.    [provider]  norethindrone (MICRONOR,CAMILA,ERRIN) 0.35 MG tablet Take 1 tablet by mouth daily.    [provider]    Norgestimate-Ethinyl Estradiol Triphasic (TRI-LO-SPRINTEC) 0.18/0.215/0.25 MG-25 MCG tab Take 1 tablet by mouth daily. 08/27/16   Rubie Maid, MD    Allergies Ciprofloxacin; Macrobid [nitrofurantoin monohyd macro]; Morphine and related; Azithromycin; Dilaudid [hydromorphone hcl]; Lactose; Prednisolone; Prednisone; Sulfa antibiotics; Tramadol hcl; Oxcarbazepine; and Pork-derived products  Family History  Problem Relation Age of Onset  . Hypertension Mother   . Cervical cancer Maternal Grandmother   . Colon cancer Neg Hx   . Breast cancer Neg Hx     Social History Social History  Substance Use Topics  . Smoking status: Former Smoker    Types: Cigarettes    Quit date: 07/14/2011  . Smokeless tobacco: Never Used     Comment: 1-3 cigarettes  . Alcohol use Yes     Comment: a beer here and there    Review of Systems Constitutional: No fever/chills Eyes: No visual changes. ENT: No sore throat. Cardiovascular: Denies chest pain. Respiratory: Denies shortness of breath. Gastrointestinal: No abdominal pain.  No nausea, no vomiting.  No diarrhea.  No constipation. Genitourinary: Negative for dysuria. Musculoskeletal: Negative for neck pain.  Negative for back pain. Integumentary: Negative for rash. Positive for insect/possible snake bite Neurological: Negative for headaches, focal weakness or numbness.  ____________________________________________   PHYSICAL EXAM:  VITAL SIGNS: ED Triage Vitals  Enc Vitals Group     BP 12/09/16 1931 (!) 183/0     Pulse Rate 12/09/16 1931 78     Resp 12/09/16 1931 18     Temp 12/09/16 1931 99.2 F (37.3 C)     Temp Source 12/09/16 1931 Oral     SpO2 12/09/16 1931 100 %     Weight 12/09/16 1932 52.2 kg (115 lb)     Height 12/09/16 1932 1.626 m (5\' 4" )     Head Circumference --      Peak Flow --      Pain Score 12/09/16 1931 6     Pain Loc --      Pain Edu? --      Excl. in [place]? --     Constitutional: Alert and oriented. Well appearing  and in no acute distress. Eyes: Conjunctivae are normal.  Head: Atraumatic. Mouth/Throat: Mucous membranes are moist. Neck: No stridor.  Cardiovascular: Normal rate, regular rhythm. Good peripheral circulation. Grossly normal heart sounds. Respiratory: Normal respiratory effort.  No retractions. Lungs CTAB. Gastrointestinal: Soft and nontender. No distention.  Musculoskeletal: No lower extremity tenderness nor edema. No gross deformities of extremities. Neurologic:  Normal speech and language. No gross focal neurologic deficits are appreciated.  Skin:  Two distinctly ovoid puncture wounds posterior to the left lateral malleoli   ____________________________________________   LABS (all labs ordered are listed, but only abnormal results are displayed)  Labs Reviewed  CBC  BASIC METABOLIC PANEL  PROTIME-INR    Procedures   ____________________________________________   INITIAL IMPRESSION / ASSESSMENT AND PLAN / ED COURSE  Pertinent labs & imaging results that were available during my care of the patient were reviewed by me and considered in my medical decision making (see chart for details).  [age] female presenting with possible snake bite to the left lower extremity. Wounds are consistent with possible snake bite however no surrounding swelling scant erythema noted concerning for possible cellulitis. As such patient will be prescribed Keflex.      ____________________________________________  FINAL CLINICAL IMPRESSION(S) / ED DIAGNOSES  Final diagnoses:  Snake bite, initial encounter     MEDICATIONS GIVEN DURING THIS VISIT:  Medications  cephALEXin (KEFLEX) capsule 500 mg (not administered)     NEW OUTPATIENT MEDICATIONS STARTED DURING THIS VISIT:  New Prescriptions   CEPHALEXIN (KEFLEX) 500 MG CAPSULE    Take 1 capsule (500 mg total) by mouth 2 (two) times daily.    Modified Medications   No medications on file    Discontinued Medications   No  medications on file     Note:  This document was prepared using Dragon voice recognition software and may include unintentional dictation errors.    Gregor Hams, MD 12/10/16 [phone]

## 2016-12-10 NOTE — Progress Notes (Signed)
[place] OFFICE PROGRESS NOTE  Patient Care Team: Danelle Berry, NP as PCP - General (Nurse Practitioner)  Cancer Staging No matching staging information was found for the patient.    No history exists.    # B12 deficiency [Dr.Pandit] ; Normocytic anemia, recent progressive fatigability. Diagnosed with B12 deficiency on 04/27/14 (despite taking oral B12). Prior labs done on 03/16/14 reported B-12 level at 212 with reference range 211-911, hemoglobin 12.7, MCV 92, WBC 5500, platelets 329. In August 2015, Cr 0.7, LFT unremarkable, Hb is 11.9; Workup done on 04/27/14 - Serum B12 low at 175, methylmalonic acid level normal at 376, iron study and folate normal, intrinsic factor antibody negative at 1.1 (reference range 0.0 to 1.1), hemoglobin 12.7, platelets 255, WBC 8400 with unremarkable differential. Patient has been started on parenteral B12 therapy on 05/01/14.   # Left back pain/ spinal bifida/ Left leg weakness/ teethered spinal cord; bladder spasticity/ on st cath.     INTERVAL HISTORY:  Melissa Pruitt 41 y.o.  female pleasant patient above history of B12 deficiency of unclear etiology is here for B-12 shot and follow-up  Patient states that she has not responded to oral B12 in the past. She reports a history of fatigue that is significantly improved after parenteral injections of B12. She reports that she has had chronic anemia since she was a small child and took supplemental iron and B12 but her symptoms never improved. She does not have a known history of thyroid dysfunction, chrohns or celiac disease, and does not report taking metformin or a PPI. She does not have a history of gastric bypass, vegan or vegetarianism, alcohol abuse, or hemodialysis. Today she reports numbness and tingling in her extremities, mild weakness or forgetfulness, and occasional sleep difficulties. She feels all of these improve after her injections.   She does has a history of chronic  medical conditions including chronic back pain, tingling and numbness of the right lower extremity secondary to spina bifida and neurogenic bowel and bladder. She was seen in the emergency room last night for snake bike which was treated with oral antibiotics and wound cleaning.    REVIEW OF SYSTEMS:  A complete 10 point review of system is done which is negative except mentioned above/history of present illness.   PAST MEDICAL HISTORY :  Past Medical History:  Diagnosis Date  . Abnormal Pap smear of cervix   . Cervical cancer [facility])    removed in July 2009  . History of recurrent UTIs   . HPV (human papilloma virus) infection   . HSV (herpes simplex virus) infection   . 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 Normocytic anemia 08/21/2014  . 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 Vitamin B 12 deficiency 08/21/2014    PAST SURGICAL HISTORY :   Past Surgical History:  Procedure Laterality Date  . BACK SURGERY    . BLADDER SURGERY    . COLOSTOMY     after flex sig as 41 year old.   . colostomy reversal    . EXPLORATORY LAPAROTOMY     with colostomy placement  . LEEP    . removal of cervical cancer    . TUBAL LIGATION      FAMILY HISTORY :   Family History  Problem Relation Age of Onset  . Hypertension Mother   . Cervical cancer Maternal Grandmother   . Colon cancer Neg Hx   . Breast cancer Neg  Hx     SOCIAL HISTORY:   Social History  Substance Use Topics  . Smoking status: Former Smoker    Types: Cigarettes    Quit date: 07/14/2011  . Smokeless tobacco: Never Used     Comment: 1-3 cigarettes  . Alcohol use Yes     Comment: a beer here and there    ALLERGIES:  is allergic to ciprofloxacin; macrobid [nitrofurantoin monohyd macro]; morphine and related; azithromycin; dilaudid [hydromorphone hcl]; lactose; prednisolone; prednisone; sulfa antibiotics; tramadol hcl; oxcarbazepine; and pork-derived products.  MEDICATIONS:  Current Outpatient  Prescriptions  Medication Sig Dispense Refill  . ampicillin (PRINCIPEN) 250 MG capsule Take 1 capsule (250 mg total) by mouth 3 (three) times daily. 21 capsule 0  . cephALEXin (KEFLEX) 500 MG capsule Take 1 capsule (500 mg total) by mouth 2 (two) times daily. 20 capsule 0  . cyanocobalamin (,VITAMIN B-12,) 1000 MCG/ML injection Inject into the muscle.    . diclofenac sodium (VOLTAREN) 1 % GEL Apply 1 application topically 4 (four) times daily as needed. arthritis    . EPINEPHrine (EPIPEN 2-PAK) 0.3 mg/0.3 mL IJ SOAJ injection Inject into the muscle once.    . fluticasone (FLONASE) 50 MCG/ACT nasal spray Place 2 sprays into the nose daily.    Marland Kitchen HYDROcodone-acetaminophen (NORCO) 5-325 MG tablet Take 1 tablet by mouth every 6 (six) hours as needed for severe pain. 7 tablet 0  . ipratropium (ATROVENT) 0.06 % nasal spray Place 2 sprays into the nose 3 (three) times daily. 15 mL 2  . lidocaine (LIDODERM) 5 % Place 1 patch onto the skin daily. Remove & Discard patch within 12 hours or as directed by MD     . loratadine (CLARITIN) 10 MG tablet Take 10 mg by mouth daily.      . methocarbamol (ROBAXIN) 750 MG tablet Take 750 mg by mouth 3 (three) times daily.    . montelukast (SINGULAIR) 10 MG tablet Take 10 mg by mouth at bedtime.    . norethindrone (MICRONOR,CAMILA,ERRIN) 0.35 MG tablet Take 1 tablet by mouth daily.    . Norgestimate-Ethinyl Estradiol Triphasic (TRI-LO-SPRINTEC) 0.18/0.215/0.25 MG-25 MCG tab Take 1 tablet by mouth daily. 1 Package 11   Current Facility-Administered Medications  Medication Dose Route Frequency Provider Last Rate Last Dose  . cyanocobalamin ((VITAMIN B-12)) injection 1,000 mcg  1,000 mcg Intramuscular Q30 days Verlon Au, NP       Facility-Administered Medications Ordered in Other Visits  Medication Dose Route Frequency Provider Last Rate Last Dose  . cyanocobalamin ((VITAMIN B-12)) injection 1,000 mcg  1,000 mcg Intramuscular Once Cammie Sickle, MD         PHYSICAL EXAMINATION:   BP 133/74   Pulse 64   Temp (!) 97.3 F (36.3 C) (Tympanic)   Resp 16   Wt 110 lb 12.8 oz (50.3 kg)   LMP 12/09/2016   BMI 19.02 kg/m   Filed Weights   12/10/16 0947  Weight: 110 lb 12.8 oz (50.3 kg)    GENERAL: Well-nourished well-developed; Alert, no distress and comfortable.   Alone.  EYES: no pallor or icterus OROPHARYNX: no thrush or ulceration; good dentition. No glossitis or ulceration NECK: supple, no masses felt LYMPH:  no palpable lymphadenopathy in the cervical, axillary or inguinal regions LUNGS: clear to auscultation and  No wheeze or crackles HEART/CVS: regular rate & rhythm and no murmurs; No lower extremity edema; right lower extremity brace/ ABDOMEN:abdomen soft, non-tender and normal bowel sounds Musculoskeletal:no cyanosis of digits and no clubbing.  Wears brace on right leg. PSYCH: alert & oriented x 3 with fluent speech NEURO: no focal motor/sensory deficits; chronic weakness of the right lower extremity. SKIN:  no rashes or significant lesions. Small wound on right lower extremity  LABORATORY DATA:  I have reviewed the data as listed    Component Value Date/Time   NA 136 12/09/2016 1954   NA 142 02/09/2014 1437   K 4.0 12/09/2016 1954   K 3.5 02/09/2014 1437   CL 103 12/09/2016 1954   CL 105 02/09/2014 1437   CO2 26 12/09/2016 1954   CO2 27 02/09/2014 1437   GLUCOSE 90 12/09/2016 1954   GLUCOSE 130 (H) 02/09/2014 1437   BUN 9 12/09/2016 1954   BUN 8 02/09/2014 1437   CREATININE 0.63 12/09/2016 1954   CREATININE 0.62 02/09/2014 1437   CALCIUM 9.1 12/09/2016 1954   CALCIUM 8.3 (L) 02/09/2014 1437   PROT 7.0 07/19/2016 2225   PROT 6.6 02/09/2014 1437   ALBUMIN 4.4 07/19/2016 2225   ALBUMIN 3.4 02/09/2014 1437   AST 15 07/19/2016 2225   AST 9 (L) 02/09/2014 1437   ALT 9 (L) 07/19/2016 2225   ALT 15 02/09/2014 1437   ALKPHOS 28 (L) 07/19/2016 2225   ALKPHOS 30 (L) 02/09/2014 1437   BILITOT 0.7 07/19/2016 2225    BILITOT 0.3 02/09/2014 1437   GFRNONAA >60 12/09/2016 1954   GFRNONAA >60 02/09/2014 1437   GFRNONAA >60 04/01/2013 1007   GFRAA >60 12/09/2016 1954   GFRAA >60 02/09/2014 1437   GFRAA >60 04/01/2013 1007    No results found for: SPEP, UPEP  Lab Results  Component Value Date   WBC 5.2 12/09/2016   NEUTROABS 2.9 12/01/2016   HGB 13.0 12/09/2016   HCT 38.6 12/09/2016   MCV 90.4 12/09/2016   PLT 265 12/09/2016      Chemistry      Component Value Date/Time   NA 136 12/09/2016 1954   NA 142 02/09/2014 1437   K 4.0 12/09/2016 1954   K 3.5 02/09/2014 1437   CL 103 12/09/2016 1954   CL 105 02/09/2014 1437   CO2 26 12/09/2016 1954   CO2 27 02/09/2014 1437   BUN 9 12/09/2016 1954   BUN 8 02/09/2014 1437   CREATININE 0.63 12/09/2016 1954   CREATININE 0.62 02/09/2014 1437      Component Value Date/Time   CALCIUM 9.1 12/09/2016 1954   CALCIUM 8.3 (L) 02/09/2014 1437   ALKPHOS 28 (L) 07/19/2016 2225   ALKPHOS 30 (L) 02/09/2014 1437   AST 15 07/19/2016 2225   AST 9 (L) 02/09/2014 1437   ALT 9 (L) 07/19/2016 2225   ALT 15 02/09/2014 1437   BILITOT 0.7 07/19/2016 2225   BILITOT 0.3 02/09/2014 1437       RADIOGRAPHIC STUDIES: I have personally reviewed the radiological images as listed and agreed with the findings in the report. No results found.   ASSESSMENT & PLAN:  Vitamin B 12 deficiency B12 deficiency unclear etiology; on monthly injections IM. CBC CMP normal.  # Continue B12 injections on a monthly basis follow-up with M.D. labs one year.  B12 deficiency of unclear etiology- She does not display anemia today. Previously B12 levels were decreased despite oral therapy. She does not have clear etiology. May consider checking her thyroid levels at next visit. Will plan for her to receive B12 injection today and return to clinic in 1 year.   No orders of the defined types were placed in this encounter.  All questions were answered. The patient knows to call the  clinic with any problems, questions or concerns.   Lauren G. Zenia Resides, NP    Verlon Au, NP 12/10/2016 10:33 AM

## 2016-12-10 NOTE — Assessment & Plan Note (Signed)
B12 deficiency unclear etiology; on monthly injections IM. CBC CMP normal.  # Continue B12 injections on a monthly basis follow-up with M.D. labs one year.

## 2016-12-10 NOTE — ED Notes (Signed)
Patient verbalizes understanding of d/c instructions and follow-up. VS stable and pain controlled per patient.  Patient in NAD at time of d/c and denies further concerns regarding this visit. Patient stable at the time of departure from the unit, departing unit by the safest and most appropriate manner per that patients condition and limitations. Patient advised to return to the ED at any time for emergent concerns, or for new/worsening symptoms.   

## 2017-01-01 ENCOUNTER — Inpatient Hospital Stay: Payer: Medicaid Other

## 2017-01-07 ENCOUNTER — Inpatient Hospital Stay: Payer: Medicaid Other

## 2017-01-08 ENCOUNTER — Inpatient Hospital Stay: Payer: Medicaid Other | Attending: Internal Medicine

## 2017-01-08 DIAGNOSIS — Z79899 Other long term (current) drug therapy: Secondary | ICD-10-CM | POA: Insufficient documentation

## 2017-01-08 DIAGNOSIS — E538 Deficiency of other specified B group vitamins: Secondary | ICD-10-CM | POA: Diagnosis not present

## 2017-01-08 MED ORDER — CYANOCOBALAMIN 1000 MCG/ML IJ SOLN
1000.0000 ug | Freq: Once | INTRAMUSCULAR | Status: AC
  Administered 2017-01-08: 1000 ug via INTRAMUSCULAR
  Filled 2017-01-08: qty 1

## 2017-02-04 ENCOUNTER — Inpatient Hospital Stay: Payer: Medicaid Other | Attending: Internal Medicine

## 2017-02-04 DIAGNOSIS — E538 Deficiency of other specified B group vitamins: Secondary | ICD-10-CM | POA: Insufficient documentation

## 2017-02-04 DIAGNOSIS — Z79899 Other long term (current) drug therapy: Secondary | ICD-10-CM | POA: Insufficient documentation

## 2017-02-05 ENCOUNTER — Inpatient Hospital Stay: Payer: Medicaid Other

## 2017-02-05 DIAGNOSIS — E538 Deficiency of other specified B group vitamins: Secondary | ICD-10-CM

## 2017-02-05 DIAGNOSIS — Z79899 Other long term (current) drug therapy: Secondary | ICD-10-CM | POA: Diagnosis not present

## 2017-02-05 MED ORDER — CYANOCOBALAMIN 1000 MCG/ML IJ SOLN
1000.0000 ug | Freq: Once | INTRAMUSCULAR | Status: AC
  Administered 2017-02-05: 1000 ug via INTRAMUSCULAR

## 2017-02-18 ENCOUNTER — Telehealth: Payer: Self-pay | Admitting: Obstetrics and Gynecology

## 2017-02-18 DIAGNOSIS — E559 Vitamin D deficiency, unspecified: Secondary | ICD-10-CM | POA: Insufficient documentation

## 2017-02-18 NOTE — Telephone Encounter (Signed)
Patient called and stated that she needs to speak with a nurse in regards to her B.C. No other information was disclosed. Please advise.

## 2017-02-18 NOTE — Telephone Encounter (Addendum)
Pt called and stated she saw her general practioner and she states they checked her labs and saw that her triglycerides were elevated. Pt wanted to know if she could stop her birth control completely. Per pt that she has her tubes tied and she just was using it for bleeding   Spoke with Dr. Marcelline Mates in the office and she states that this B.C could be the reason why her levels is elevated. Dr. Marcelline Mates states to re-assure pt that it was up to her if she wanted to stop. She states these levels should regulate once off the medication but to make patient aware, unsure of how her bleeding will be once off. She also said we go offer pt other options that does not have the estrogen.  Spoke with pt and she would like to just stop medication and is not concerned at this time with the bleeding. She will contact us if she changes her mind. She had no additional questions at this time. Nothing further is needed

## 2017-03-11 ENCOUNTER — Inpatient Hospital Stay: Payer: Medicaid Other | Attending: Internal Medicine

## 2017-03-11 DIAGNOSIS — E538 Deficiency of other specified B group vitamins: Secondary | ICD-10-CM | POA: Diagnosis not present

## 2017-03-11 DIAGNOSIS — Z79899 Other long term (current) drug therapy: Secondary | ICD-10-CM | POA: Insufficient documentation

## 2017-03-11 MED ORDER — CYANOCOBALAMIN 1000 MCG/ML IJ SOLN
1000.0000 ug | Freq: Once | INTRAMUSCULAR | Status: AC
  Administered 2017-03-11: 1000 ug via INTRAMUSCULAR

## 2017-04-08 ENCOUNTER — Inpatient Hospital Stay: Payer: Medicaid Other

## 2017-04-09 ENCOUNTER — Inpatient Hospital Stay: Payer: Medicaid Other | Attending: Internal Medicine

## 2017-04-09 DIAGNOSIS — Z79899 Other long term (current) drug therapy: Secondary | ICD-10-CM | POA: Diagnosis not present

## 2017-04-09 DIAGNOSIS — E538 Deficiency of other specified B group vitamins: Secondary | ICD-10-CM | POA: Diagnosis not present

## 2017-04-09 MED ORDER — CYANOCOBALAMIN 1000 MCG/ML IJ SOLN
1000.0000 ug | Freq: Once | INTRAMUSCULAR | Status: AC
  Administered 2017-04-09: 1000 ug via INTRAMUSCULAR

## 2017-04-29 ENCOUNTER — Ambulatory Visit: Payer: Medicaid Other | Admitting: Obstetrics and Gynecology

## 2017-04-29 ENCOUNTER — Encounter: Payer: Self-pay | Admitting: Obstetrics and Gynecology

## 2017-04-29 VITALS — BP 101/66 | HR 82 | Ht 64.0 in | Wt 116.6 lb

## 2017-04-29 DIAGNOSIS — N938 Other specified abnormal uterine and vaginal bleeding: Secondary | ICD-10-CM | POA: Diagnosis not present

## 2017-04-29 DIAGNOSIS — D259 Leiomyoma of uterus, unspecified: Secondary | ICD-10-CM

## 2017-04-29 DIAGNOSIS — N368 Other specified disorders of urethra: Secondary | ICD-10-CM | POA: Diagnosis not present

## 2017-04-29 MED ORDER — NORETHINDRONE 0.35 MG PO TABS: 1.0000 | ORAL_TABLET | Freq: Every day | ORAL | 11 refills | Status: DC

## 2017-04-29 NOTE — Progress Notes (Signed)
    GYNECOLOGY PROGRESS NOTE  Subjective:    Patient ID: Melissa Pruitt, female    DOB: 1975-10-11, 42 y.o.   MRN: [id number]  HPI  Patient is a 42 y.o. G3P3 female who presents for complaints of heavy vaginal bleeding x 2 days. She does have a history of uterine fibroids. She reports that she discontinued her birth control in November as she noted her PCP told her it might be the cause of her elevated lipid levels.  Denies any bleeding in December but noted 1-2 days of mild cramping.  However over the past 2 days, she states that her bleeding and cramping has been heavy, to the point that she is now wearing Depends urinary pads for her bleeding.  She also reports the passage of several large blood clots and feeling light-headed yesterday after being at the grocery store. Notes she has been in bed most of the day today.     The following portions of the patient's history were reviewed and updated as appropriate: allergies, current medications, past family history, past medical history, past social history, past surgical history and problem list.  Review of Systems Pertinent items noted in HPI and remainder of comprehensive ROS otherwise negative.   Objective:   Blood pressure 101/66, pulse 82, height 5\' 4"  (1.626 m), weight 116 lb 9.6 oz (52.9 kg). General appearance: alert, cooperative and no distress Abdomen: soft, non-tender; bowel sounds normal; no masses,  no organomegaly Pelvic: external genitalia normal, except small brown-black mole with irregular contours on right vulva. Rectovaginal septum normal. Sub-urethral cyst (~ 3 cm) present, visible at introitus. Vagina with small amount of dark red blood, no clots present.  Cervix with post-LEEP changes, no lesions and no motion tenderness.  Uterus mobile, nontender, normal shape and size.  Adnexae non-palpable, nontender bilaterally.  Extremities: extremities normal, atraumatic, no cyanosis or edema Neurologic: Grossly normal   Assessment:    DUB H/o fibroid uterus Sub-urethral cyst  Plan:   - Discussed management options for abnormal uterine bleeding including tranexamic acid (Lysteda), oral progesterone (Megace), Depo Provera, Mirena IUD (depending on location of fibroid), endometrial ablation (Novasure/Hydrothermal Ablation) or hysterectomy as definitive surgical management.  Discussed risks and benefits of each method.   Patient notes that she ultimately desires a hysterectomy, however would not be able to have this performed until the summer as she is a single mother and would need assistance at home.  Printed patient education handouts were given to the patient to review at home. She has decided to return back to progesterone OCPs for now (previously discontinued due to suffering from back acne).   - Advised to return if symptoms worsen. Will also return during summer months to plan for hysterectomy. She notes that she would like to have sub-urethral cyst removed at same time as hysterectomy. Has been ruled out in the past for suburethral diverticulum.     A total of 25 minutes were spent face-to-face with the patient during this encounter and over half of that time involved counseling and coordination of care.    Rubie Maid, MD [facility]

## 2017-04-30 ENCOUNTER — Encounter: Payer: Self-pay | Admitting: Obstetrics and Gynecology

## 2017-05-06 ENCOUNTER — Inpatient Hospital Stay: Payer: Medicaid Other | Attending: Internal Medicine

## 2017-05-06 DIAGNOSIS — E538 Deficiency of other specified B group vitamins: Secondary | ICD-10-CM | POA: Insufficient documentation

## 2017-05-06 DIAGNOSIS — Z79899 Other long term (current) drug therapy: Secondary | ICD-10-CM | POA: Diagnosis not present

## 2017-05-06 MED ORDER — CYANOCOBALAMIN 1000 MCG/ML IJ SOLN
1000.0000 ug | Freq: Once | INTRAMUSCULAR | Status: AC
  Administered 2017-05-06: 1000 ug via INTRAMUSCULAR
  Filled 2017-05-06: qty 1

## 2017-06-10 ENCOUNTER — Inpatient Hospital Stay: Payer: Medicaid Other | Attending: Internal Medicine

## 2017-06-10 DIAGNOSIS — E538 Deficiency of other specified B group vitamins: Secondary | ICD-10-CM | POA: Diagnosis present

## 2017-06-10 DIAGNOSIS — Z79899 Other long term (current) drug therapy: Secondary | ICD-10-CM | POA: Diagnosis not present

## 2017-06-10 MED ORDER — CYANOCOBALAMIN 1000 MCG/ML IJ SOLN
1000.0000 ug | Freq: Once | INTRAMUSCULAR | Status: AC
  Administered 2017-06-10: 1000 ug via INTRAMUSCULAR

## 2017-07-08 ENCOUNTER — Inpatient Hospital Stay: Payer: Medicaid Other

## 2017-07-09 ENCOUNTER — Encounter: Payer: Self-pay | Admitting: Internal Medicine

## 2017-07-09 ENCOUNTER — Inpatient Hospital Stay: Payer: Medicaid Other | Attending: Internal Medicine

## 2017-07-09 DIAGNOSIS — Z79899 Other long term (current) drug therapy: Secondary | ICD-10-CM | POA: Insufficient documentation

## 2017-07-09 DIAGNOSIS — E538 Deficiency of other specified B group vitamins: Secondary | ICD-10-CM | POA: Diagnosis not present

## 2017-07-09 MED ORDER — CYANOCOBALAMIN 1000 MCG/ML IJ SOLN
1000.0000 ug | Freq: Once | INTRAMUSCULAR | Status: AC
  Administered 2017-07-09: 1000 ug via INTRAMUSCULAR

## 2017-08-05 ENCOUNTER — Inpatient Hospital Stay: Payer: Medicaid Other | Attending: Internal Medicine

## 2017-08-05 DIAGNOSIS — E538 Deficiency of other specified B group vitamins: Secondary | ICD-10-CM | POA: Insufficient documentation

## 2017-08-05 DIAGNOSIS — Z79899 Other long term (current) drug therapy: Secondary | ICD-10-CM | POA: Insufficient documentation

## 2017-08-05 MED ORDER — CYANOCOBALAMIN 1000 MCG/ML IJ SOLN
1000.0000 ug | Freq: Once | INTRAMUSCULAR | Status: AC
  Administered 2017-08-05: 1000 ug via INTRAMUSCULAR

## 2017-08-11 ENCOUNTER — Encounter (INDEPENDENT_AMBULATORY_CARE_PROVIDER_SITE_OTHER): Payer: Self-pay

## 2017-08-27 ENCOUNTER — Encounter: Payer: Medicaid Other | Admitting: Obstetrics and Gynecology

## 2017-08-28 ENCOUNTER — Encounter: Payer: Self-pay | Admitting: Internal Medicine

## 2017-09-01 ENCOUNTER — Encounter: Payer: Self-pay | Admitting: Obstetrics and Gynecology

## 2017-09-01 ENCOUNTER — Ambulatory Visit: Payer: Medicaid Other | Admitting: Obstetrics and Gynecology

## 2017-09-01 VITALS — BP 95/63 | HR 74 | Ht 64.0 in | Wt 121.9 lb

## 2017-09-01 DIAGNOSIS — N368 Other specified disorders of urethra: Secondary | ICD-10-CM

## 2017-09-01 DIAGNOSIS — N938 Other specified abnormal uterine and vaginal bleeding: Secondary | ICD-10-CM

## 2017-09-01 DIAGNOSIS — N898 Other specified noninflammatory disorders of vagina: Secondary | ICD-10-CM | POA: Diagnosis not present

## 2017-09-01 DIAGNOSIS — D259 Leiomyoma of uterus, unspecified: Secondary | ICD-10-CM | POA: Diagnosis not present

## 2017-09-01 NOTE — Progress Notes (Signed)
    GYNECOLOGY PROGRESS NOTE  Subjective:    Patient ID: Melissa Pruitt, female    DOB: 10-04-1975, 42 y.o.   MRN: [id number]  HPI  Patient is a 42 y.o. G3P3 female who was initially unsure for what current visit was for as she notes has been doing well. Review of last visit in January noted that patient was having problems with her birth control and was changed to a progesterone-only pill.  If her symptoms had not improved she was supposed to f/u for plans to schedule a hysterectomy near the summer months.  Patient notes that overall the pills have been working well for her and at this time does not desire a hysterectomy.   Of note, patient does have complaints of vaginal discharge for the past several days, white, clumpy.  No itching/burning.    The following portions of the patient's history were reviewed and updated as appropriate: allergies, current medications, past family history, past medical history, past social history, past surgical history and problem list.  Review of Systems Pertinent items noted in HPI and remainder of comprehensive ROS otherwise negative.   Objective:   Blood pressure 95/63, pulse 74, height 5\' 4"  (1.626 m), weight 121 lb 14.4 oz (55.3 kg). General appearance: alert and no distress Abdomen: soft, non-tender; bowel sounds normal; no masses,  no organomegaly Pelvic: external genitalia normal, rectovaginal septum normal.  Vagina with scant thin white discharge, no odor.  Cervix normal appearing, no lesions and no motion tenderness.  Bimanual exam not performed.    Labs:  Microscopic wet-mount exam shows negative for pathogens, normal epithelial cells.  Assessment:  H/o DUB and fibroid uterus Vaginal discharge Sub-urethral cyst   Plan:   - Continue on progesterone OCPs as this is managing her bleeding symptoms, likely secondary to fibroid uterus. - Vaginal discharge with negative wet prep. Given reassurance.  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 Notes that if she does require surgery she would like to have this repaired as well.  Is not bothersome except during intercourse where it sometimes causes problems with condom use with partner.    Rubie Maid, MD [facility]

## 2017-09-01 NOTE — Progress Notes (Signed)
Pt stated that she doing well no concerns.

## 2017-09-02 ENCOUNTER — Inpatient Hospital Stay: Payer: Medicaid Other

## 2017-09-03 ENCOUNTER — Inpatient Hospital Stay: Payer: Medicaid Other | Attending: Internal Medicine

## 2017-09-03 DIAGNOSIS — E538 Deficiency of other specified B group vitamins: Secondary | ICD-10-CM | POA: Diagnosis not present

## 2017-09-03 DIAGNOSIS — Z79899 Other long term (current) drug therapy: Secondary | ICD-10-CM | POA: Diagnosis not present

## 2017-09-03 MED ORDER — CYANOCOBALAMIN 1000 MCG/ML IJ SOLN
1000.0000 ug | Freq: Once | INTRAMUSCULAR | Status: AC
  Administered 2017-09-03: 1000 ug via INTRAMUSCULAR

## 2017-10-07 ENCOUNTER — Inpatient Hospital Stay: Payer: Medicaid Other

## 2017-10-08 ENCOUNTER — Inpatient Hospital Stay: Payer: Medicaid Other | Attending: Internal Medicine

## 2017-10-08 DIAGNOSIS — E538 Deficiency of other specified B group vitamins: Secondary | ICD-10-CM | POA: Diagnosis present

## 2017-10-08 DIAGNOSIS — Z79899 Other long term (current) drug therapy: Secondary | ICD-10-CM | POA: Insufficient documentation

## 2017-10-08 MED ORDER — CYANOCOBALAMIN 1000 MCG/ML IJ SOLN
1000.0000 ug | Freq: Once | INTRAMUSCULAR | Status: AC
  Administered 2017-10-08: 1000 ug via INTRAMUSCULAR

## 2017-11-04 ENCOUNTER — Inpatient Hospital Stay: Payer: Medicaid Other | Attending: Internal Medicine

## 2017-11-04 DIAGNOSIS — Z79899 Other long term (current) drug therapy: Secondary | ICD-10-CM | POA: Insufficient documentation

## 2017-11-04 DIAGNOSIS — E538 Deficiency of other specified B group vitamins: Secondary | ICD-10-CM

## 2017-11-04 MED ORDER — CYANOCOBALAMIN 1000 MCG/ML IJ SOLN
1000.0000 ug | Freq: Once | INTRAMUSCULAR | Status: AC
  Administered 2017-11-04: 1000 ug via INTRAMUSCULAR

## 2017-11-06 ENCOUNTER — Ambulatory Visit: Payer: Medicaid Other | Admitting: Infectious Disease

## 2017-11-17 ENCOUNTER — Encounter: Payer: Self-pay | Admitting: Internal Medicine

## 2017-11-17 ENCOUNTER — Ambulatory Visit (INDEPENDENT_AMBULATORY_CARE_PROVIDER_SITE_OTHER): Payer: Medicaid Other | Admitting: Internal Medicine

## 2017-11-17 DIAGNOSIS — M25541 Pain in joints of right hand: Secondary | ICD-10-CM | POA: Insufficient documentation

## 2017-11-17 NOTE — Progress Notes (Signed)
[place] for Infectious Disease  Reason for Consult: Arthralgias and a positive [place] spotted fever serology Referring Provider: Margurite Auerbach, NP  Assessment: I do not know the cause of her recent right hand pain but I do not believe that it is due to infection.  The positive [facility] spotted fever IgG antibody most likely represents remote, inactive infection with a rickettsial species.  She has no evidence of active [facility] spotted fever and no evidence of Lyme disease.  I do not feel that she needs further testing or any treatment.  Plan: 1. No further testing or treatment required.  Patient Active Problem List   Diagnosis Date Noted  . Arthralgia of hand, right 11/17/2017    Priority: High  . Normocytic anemia 08/21/2014  . Vitamin B 12 deficiency 08/21/2014  . Cervical pain 08/25/2012  . Pain in joint, pelvic region and thigh 07/16/2012  . Tethered spinal cord ([place]) 07/16/2012  . Balance disorder 07/16/2012  . Personal history of fall 07/16/2012  . 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 [place] SP CORD&NERV SYSTEM 05/08/2010  . H N P-LUMBAR 04/23/2010  . BACK PAIN 01/28/2010    Patient's Medications  New Prescriptions   No medications on file  Previous Medications   AMOXICILLIN (AMOXIL) 500 MG CAPSULE    Take 500 mg by mouth 3 (three) times daily.   AMPICILLIN (PRINCIPEN) 250 MG CAPSULE    Take 1 capsule (250 mg total) by mouth 3 (three) times daily.   CYANOCOBALAMIN (,VITAMIN B-12,) 1000 MCG/ML INJECTION    Inject into the muscle.   DICLOFENAC SODIUM (VOLTAREN) 1 % GEL    Apply 1 application topically 4 (four) times daily as needed. arthritis   EPINEPHRINE (EPIPEN 2-PAK) 0.3 MG/0.3 ML IJ SOAJ INJECTION    Inject into the muscle once.   FLUTICASONE (FLONASE) 50 MCG/ACT NASAL SPRAY    Place 2 sprays into the nose daily.   HYDROCODONE-ACETAMINOPHEN (NORCO) 5-325 MG TABLET    Take 1 tablet by mouth every 6 (six) hours as needed for severe pain.   LIDOCAINE (LIDODERM) 5 %    Place 1 patch onto the skin daily. Remove & Discard patch within 12 hours or as directed by MD    LORATADINE (CLARITIN) 10 MG TABLET    Take 10 mg by mouth daily.     METHOCARBAMOL (ROBAXIN) 750 MG TABLET    Take 750 mg by mouth 3 (three) times daily.   MONTELUKAST (SINGULAIR) 10 MG TABLET    Take 10 mg by mouth at bedtime.   NORETHINDRONE (MICRONOR,CAMILA,ERRIN) 0.35 MG TABLET    Take 1 tablet (0.35 mg total) by mouth daily.  Modified Medications   No medications on file  Discontinued Medications   IPRATROPIUM (ATROVENT) 0.06 % NASAL SPRAY    Place 2 sprays into the nose 3 (three) times daily.    HPI: Melissa Pruitt is a 42 y.o. female with a history of tethered spinal cord and neurogenic bladder related to previous myelomeningocele repair.  She recently developed some right hand pain and burning sensation and underwent evaluation.  She has a history of frequent tick exposure having removed many attached ticks during her lifetime.  She also believes she had a copperhead bite nearly 1 year ago.  She recently had serologic testing which showed equivocal total [facility] spotted fever antibody.  Specific IgG antibody  was positive at a titer of 1:128 IgM antibody was negative.  She says that she was told that her Lyme serology was also positive but copies of testing provided to me reveals that total Lyme antibody was negative as well as negative IgM Western blot and IgG Western blot.  She has not had any recent unexplained fever.  She has not had any new rashes.  Review of Systems: Review of Systems  Constitutional: Negative for chills, diaphoresis, fever and weight loss.  HENT: Negative for congestion and sore throat.   Respiratory: Negative for cough, sputum production and shortness of breath.   Cardiovascular: Negative for chest pain.    Gastrointestinal: Negative for abdominal pain, diarrhea, nausea and vomiting.  Genitourinary:       She self catheterizes 6 times daily.  Musculoskeletal: Positive for back pain and joint pain.  Skin: Negative for itching and rash.       She has a chronic, pruritic rash on her back that she refers to as "bacne".  Neurological: Positive for sensory change.      Past Medical History:  Diagnosis Date  . Abnormal Pap smear of cervix   . Cervical cancer [facility])    removed in July 2009  . History of recurrent UTIs   . HPV (human papilloma virus) infection   . HSV (herpes simplex virus) infection   . 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[facility])    sacro/coccygeal mass excision  . Normocytic anemia 08/21/2014  . Tethered spinal cord ([place])   . Vitamin B 12 deficiency 08/21/2014    Social History   Tobacco Use  . Smoking status: Former Smoker    Types: Cigarettes    Last attempt to quit: 07/14/2011    Years since quitting: 6.3  . Smokeless tobacco: Never Used  . Tobacco comment: 1-3 cigarettes  Substance Use Topics  . Alcohol use: Yes    Comment: a beer here and there  . Drug use: No    Family History  Problem Relation Age of Onset  . Hypertension Mother   . Cervical cancer Maternal Grandmother   . Colon cancer Neg Hx   . Breast cancer Neg Hx    Allergies  Allergen Reactions  . Ciprofloxacin Anaphylaxis    Heart and nerve issues  . Macrobid [Nitrofurantoin Monohyd Macro] Anaphylaxis, Hives and Shortness Of Breath  . Morphine And Related Anaphylaxis  . Azithromycin Nausea And Vomiting  . Dilaudid [Hydromorphone Hcl] Other (See Comments)    Makes patient feel like she out of it  . Lactose     Other reaction(s): Other (See Comments) Digestive issues/pain  . Prednisolone Other (See Comments)    Chest pain, left arm and hand tingling, eye pain, and mania  . Prednisone Other (See Comments)  . Sulfa Antibiotics Nausea And Vomiting  . Tramadol Hcl     REACTION: HEART  PALPITATIONS /SWELLING /SOB  . Oxcarbazepine Rash  . Pork-Derived Products Nausea And Vomiting    OBJECTIVE: Vitals:   11/17/17 1108  BP: 100/63  Pulse: 74  Temp: 98.3 F (36.8 C)  Weight: 121 lb (54.9 kg)  Height: 5\' 4"  (1.626 m)   Body mass index is 20.77 kg/m.   Physical Exam  Constitutional:  She is talkative and in good spirits.  HENT:  Mouth/Throat: No oropharyngeal exudate.  Eyes: Conjunctivae are normal.  Cardiovascular: Normal rate, regular rhythm and normal heart sounds.  No murmur heard. Pulmonary/Chest: Effort normal and breath sounds normal. She has no wheezes. She has no  rales.  Abdominal: Soft. There is no tenderness.  Musculoskeletal: Normal range of motion. She exhibits no edema or tenderness.  I do not find any acute abnormalities of her right hand.  There is no redness, swelling, pain with palpation or restricted range of motion.  Skin: Rash noted.  She has chronic follicular-based papules on her back.  She has no acute rash.  Psychiatric: She has a normal mood and affect.    Microbiology: No results found for this or any previous visit (from the past 240 hour(s)).  Michel Bickers, MD [facility] for Infectious [facility] [phone] pager   [phone] cell 11/17/2017, 12:45 PM

## 2017-12-09 ENCOUNTER — Inpatient Hospital Stay: Payer: Medicaid Other

## 2017-12-09 ENCOUNTER — Inpatient Hospital Stay: Payer: Medicaid Other | Attending: Internal Medicine

## 2017-12-09 ENCOUNTER — Inpatient Hospital Stay (HOSPITAL_BASED_OUTPATIENT_CLINIC_OR_DEPARTMENT_OTHER): Payer: Medicaid Other | Admitting: Internal Medicine

## 2017-12-09 VITALS — BP 115/74 | HR 84 | Temp 97.9°F | Resp 20 | Ht 64.0 in | Wt 119.0 lb

## 2017-12-09 DIAGNOSIS — Z8541 Personal history of malignant neoplasm of cervix uteri: Secondary | ICD-10-CM | POA: Insufficient documentation

## 2017-12-09 DIAGNOSIS — E538 Deficiency of other specified B group vitamins: Secondary | ICD-10-CM | POA: Insufficient documentation

## 2017-12-09 DIAGNOSIS — M549 Dorsalgia, unspecified: Secondary | ICD-10-CM

## 2017-12-09 DIAGNOSIS — Z87440 Personal history of urinary (tract) infections: Secondary | ICD-10-CM | POA: Diagnosis not present

## 2017-12-09 DIAGNOSIS — Z87891 Personal history of nicotine dependence: Secondary | ICD-10-CM

## 2017-12-09 DIAGNOSIS — Z79899 Other long term (current) drug therapy: Secondary | ICD-10-CM

## 2017-12-09 LAB — COMPREHENSIVE METABOLIC PANEL
ALT: 9 U/L (ref 0–44)
AST: 16 U/L (ref 15–41)
Albumin: 4.7 g/dL (ref 3.5–5.0)
Alkaline Phosphatase: 32 U/L — ABNORMAL LOW (ref 38–126)
Anion gap: 9 (ref 5–15)
BILIRUBIN TOTAL: 1.1 mg/dL (ref 0.3–1.2)
BUN: 13 mg/dL (ref 6–20)
CO2: 25 mmol/L (ref 22–32)
Calcium: 9.5 mg/dL (ref 8.9–10.3)
Chloride: 105 mmol/L (ref 98–111)
Creatinine, Ser: 0.6 mg/dL (ref 0.44–1.00)
Glucose, Bld: 100 mg/dL — ABNORMAL HIGH (ref 70–99)
POTASSIUM: 4.1 mmol/L (ref 3.5–5.1)
Sodium: 139 mmol/L (ref 135–145)
TOTAL PROTEIN: 7.2 g/dL (ref 6.5–8.1)

## 2017-12-09 LAB — CBC WITH DIFFERENTIAL/PLATELET
Basophils Absolute: 0.1 10*3/uL (ref 0–0.1)
Basophils Relative: 1 %
EOS ABS: 0.1 10*3/uL (ref 0–0.7)
EOS PCT: 1 %
HCT: 38.8 % (ref 35.0–47.0)
Hemoglobin: 13.3 g/dL (ref 12.0–16.0)
Lymphocytes Relative: 37 %
Lymphs Abs: 2 10*3/uL (ref 1.0–3.6)
MCH: 30.8 pg (ref 26.0–34.0)
MCHC: 34.2 g/dL (ref 32.0–36.0)
MCV: 90.1 fL (ref 80.0–100.0)
Monocytes Absolute: 0.4 10*3/uL (ref 0.2–0.9)
Monocytes Relative: 8 %
NEUTROS PCT: 53 %
Neutro Abs: 2.9 10*3/uL (ref 1.4–6.5)
Platelets: 229 10*3/uL (ref 150–440)
RBC: 4.3 MIL/uL (ref 3.80–5.20)
RDW: 12.6 % (ref 11.5–14.5)
WBC: 5.5 10*3/uL (ref 3.6–11.0)

## 2017-12-09 LAB — IRON AND TIBC
Iron: 111 ug/dL (ref 28–170)
Saturation Ratios: 38 % — ABNORMAL HIGH (ref 10.4–31.8)
TIBC: 291 ug/dL (ref 250–450)
UIBC: 180 ug/dL

## 2017-12-09 LAB — VITAMIN B12: VITAMIN B 12: 514 pg/mL (ref 180–914)

## 2017-12-09 LAB — FOLATE: Folate: 22.5 ng/mL (ref >5.9)

## 2017-12-09 LAB — RETICULOCYTES
RBC.: 4.19 MIL/uL (ref 3.80–5.20)
RETIC COUNT ABSOLUTE: 16.8 10*3/uL — AB (ref 19.0–183.0)
RETIC CT PCT: 0.4 % (ref 0.4–3.1)

## 2017-12-09 LAB — FERRITIN: FERRITIN: 82 ng/mL (ref 11–307)

## 2017-12-09 LAB — TSH: TSH: 2.405 u[IU]/mL (ref 0.350–4.500)

## 2017-12-09 MED ORDER — CYANOCOBALAMIN 1000 MCG/ML IJ SOLN
1000.0000 ug | Freq: Once | INTRAMUSCULAR | Status: AC
  Administered 2017-12-09: 1000 ug via INTRAMUSCULAR
  Filled 2017-12-09: qty 1

## 2017-12-09 NOTE — Assessment & Plan Note (Addendum)
B12 deficiency unclear etiology; on monthly injections IM. CBC CMP normal.  # pain at site of injection site- discussed re: SL; pt prefers injections- SQ vs IM  # Continue B12 injections on a monthly basis follow-up with M.D. labs one year.

## 2017-12-09 NOTE — Progress Notes (Signed)
[place] OFFICE PROGRESS NOTE  Patient Care Team: Danelle Berry, NP as PCP - General (Nurse Practitioner)  Cancer Staging No matching staging information was found for the patient.    No history exists.    # B12 deficiency [Dr.Pandit] ; Normocytic anemia, recent progressive fatigability. Diagnosed with B12 deficiency on 04/27/14 (despite taking oral B12). Prior labs done on 03/16/14 reported B-12 level at 212 with reference range 211-911, hemoglobin 12.7, MCV 92, WBC 5500, platelets 329. In August 2015, Cr 0.7, LFT unremarkable, Hb is 11.9; Workup done on 04/27/14 - Serum B12 low at 175, methylmalonic acid level normal at 376, iron study and folate normal, intrinsic factor antibody negative at 1.1 (reference range 0.0 to 1.1), hemoglobin 12.7, platelets 255, WBC 8400 with unremarkable differential. Patient has been started on parenteral B12 therapy on 05/01/14.   # Left back pain/ spinal bifida/ Left leg weakness/ teethered spinal cord; bladder spasticity/ on st cath.     INTERVAL HISTORY:  Melissa Pruitt 42 y.o.  female pleasant patient above history of B12 deficiency unclear etiology   is here for B-12 shot.  Patient complains of mild to moderate fatigue.  No fever no chills. Patient has chronic back pain/chronic tingling and numbness of the right lower extremity secondary to neurologic problems this is not any new.  REVIEW OF SYSTEMS:  A complete 10 point review of system is done which is negative except mentioned above/history of present illness.   PAST MEDICAL HISTORY :  Past Medical History:  Diagnosis Date  . Abnormal Pap smear of cervix   . Cervical cancer [facility])    removed in July 2009  . History of recurrent UTIs   . HPV (human papilloma virus) infection   . HSV (herpes simplex virus) infection   . 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[facility])    sacro/coccygeal mass excision  . Normocytic anemia 08/21/2014  . Tethered spinal cord ([place])   . Vitamin B 12  deficiency 08/21/2014    PAST SURGICAL HISTORY :   Past Surgical History:  Procedure Laterality Date  . BACK SURGERY    . BLADDER SURGERY    . COLOSTOMY     after flex sig as 42 year old.   . colostomy reversal    . EXPLORATORY LAPAROTOMY     with colostomy placement  . LEEP    . removal of cervical cancer    . TUBAL LIGATION      FAMILY HISTORY :   Family History  Problem Relation Age of Onset  . Hypertension Mother   . Cervical cancer Maternal Grandmother   . Colon cancer Neg Hx   . Breast cancer Neg Hx     SOCIAL HISTORY:   Social History   Tobacco Use  . Smoking status: Former Smoker    Types: Cigarettes    Last attempt to quit: 07/14/2011    Years since quitting: 6.4  . Smokeless tobacco: Never Used  . Tobacco comment: 1-3 cigarettes  Substance Use Topics  . Alcohol use: Yes    Comment: a beer here and there  . Drug use: No    ALLERGIES:  is allergic to ciprofloxacin; macrobid [nitrofurantoin monohyd macro]; morphine and related; azithromycin; dilaudid [hydromorphone hcl]; lactose; prednisolone; prednisone; sulfa antibiotics; tramadol hcl; oxcarbazepine; and pork-derived products.  MEDICATIONS:  Current Outpatient Medications  Medication Sig Dispense Refill  . cyanocobalamin (,VITAMIN B-12,) 1000 MCG/ML injection Inject into the muscle.    . fluticasone (FLONASE) 50 MCG/ACT nasal spray  Place 2 sprays into the nose daily.    Marland Kitchen lidocaine (LIDODERM) 5 % Place 1 patch onto the skin daily. Remove & Discard patch within 12 hours or as directed by MD     . loratadine (CLARITIN) 10 MG tablet Take 10 mg by mouth daily.      . methocarbamol (ROBAXIN) 750 MG tablet Take 750 mg by mouth 3 (three) times daily.    . montelukast (SINGULAIR) 10 MG tablet Take 10 mg by mouth at bedtime.    . norethindrone (MICRONOR,CAMILA,ERRIN) 0.35 MG tablet Take 1 tablet (0.35 mg total) by mouth daily. 1 Package 11  . diclofenac sodium (VOLTAREN) 1 % GEL Apply 1 application topically 4 (four)  times daily as needed. arthritis    . EPINEPHrine (EPIPEN 2-PAK) 0.3 mg/0.3 mL IJ SOAJ injection Inject into the muscle once.     No current facility-administered medications for this visit.     PHYSICAL EXAMINATION:   BP 115/74 (Patient Position: Sitting)   Pulse 84   Temp 97.9 F (36.6 C) (Tympanic)   Resp 20   Ht 5\' 4"  (1.626 m)   Wt 119 lb (54 kg)   BMI 20.43 kg/m   Filed Weights   12/09/17 1337  Weight: 119 lb (54 kg)    GENERAL: Well-nourished well-developed; Alert, no distress and comfortable.   Alone.  EYES: no pallor or icterus OROPHARYNX: no thrush or ulceration; good dentition  NECK: supple, no masses felt LYMPH:  no palpable lymphadenopathy in the cervical, axillary or inguinal regions LUNGS: clear to auscultation and  No wheeze or crackles HEART/CVS: regular rate & rhythm and no murmurs; No lower extremity edema; right lower extremity brace/ ABDOMEN:abdomen soft, non-tender and normal bowel sounds Musculoskeletal:no cyanosis of digits and no clubbing  PSYCH: alert & oriented x 3 with fluent speech NEURO: no focal motor/sensory deficits; chronic weakness of the right lower extremity. SKIN:  no rashes or significant lesions  LABORATORY DATA:  I have reviewed the data as listed    Component Value Date/Time   NA 139 12/09/2017 1323   NA 142 02/09/2014 1437   K 4.1 12/09/2017 1323   K 3.5 02/09/2014 1437   CL 105 12/09/2017 1323   CL 105 02/09/2014 1437   CO2 25 12/09/2017 1323   CO2 27 02/09/2014 1437   GLUCOSE 100 (H) 12/09/2017 1323   GLUCOSE 130 (H) 02/09/2014 1437   BUN 13 12/09/2017 1323   BUN 8 02/09/2014 1437   CREATININE 0.60 12/09/2017 1323   CREATININE 0.62 02/09/2014 1437   CALCIUM 9.5 12/09/2017 1323   CALCIUM 8.3 (L) 02/09/2014 1437   PROT 7.2 12/09/2017 1323   PROT 6.6 02/09/2014 1437   ALBUMIN 4.7 12/09/2017 1323   ALBUMIN 3.4 02/09/2014 1437   AST 16 12/09/2017 1323   AST 9 (L) 02/09/2014 1437   ALT 9 12/09/2017 1323   ALT 15  02/09/2014 1437   ALKPHOS 32 (L) 12/09/2017 1323   ALKPHOS 30 (L) 02/09/2014 1437   BILITOT 1.1 12/09/2017 1323   BILITOT 0.3 02/09/2014 1437   GFRNONAA >60 12/09/2017 1323   GFRNONAA >60 02/09/2014 1437   GFRNONAA >60 04/01/2013 1007   GFRAA >60 12/09/2017 1323   GFRAA >60 02/09/2014 1437   GFRAA >60 04/01/2013 1007    No results found for: SPEP, UPEP  Lab Results  Component Value Date   WBC 5.5 12/09/2017   NEUTROABS 2.9 12/09/2017   HGB 13.3 12/09/2017   HCT 38.8 12/09/2017   MCV 90.1  12/09/2017   PLT 229 12/09/2017      Chemistry      Component Value Date/Time   NA 139 12/09/2017 1323   NA 142 02/09/2014 1437   K 4.1 12/09/2017 1323   K 3.5 02/09/2014 1437   CL 105 12/09/2017 1323   CL 105 02/09/2014 1437   CO2 25 12/09/2017 1323   CO2 27 02/09/2014 1437   BUN 13 12/09/2017 1323   BUN 8 02/09/2014 1437   CREATININE 0.60 12/09/2017 1323   CREATININE 0.62 02/09/2014 1437      Component Value Date/Time   CALCIUM 9.5 12/09/2017 1323   CALCIUM 8.3 (L) 02/09/2014 1437   ALKPHOS 32 (L) 12/09/2017 1323   ALKPHOS 30 (L) 02/09/2014 1437   AST 16 12/09/2017 1323   AST 9 (L) 02/09/2014 1437   ALT 9 12/09/2017 1323   ALT 15 02/09/2014 1437   BILITOT 1.1 12/09/2017 1323   BILITOT 0.3 02/09/2014 1437       RADIOGRAPHIC STUDIES: I have personally reviewed the radiological images as listed and agreed with the findings in the report. No results found.   ASSESSMENT & PLAN:  Vitamin B 12 deficiency B12 deficiency unclear etiology; on monthly injections IM. CBC CMP normal.  # pain at site of injection site- discussed re: SL; pt prefers injections- SQ vs IM  # Continue B12 injections on a monthly basis follow-up with M.D. labs one year.   No orders of the defined types were placed in this encounter.  All questions were answered. The patient knows to call the clinic with any problems, questions or concerns.      Cammie Sickle, MD 12/09/2017 2:12 PM

## 2018-01-06 ENCOUNTER — Inpatient Hospital Stay: Payer: Medicaid Other

## 2018-01-06 ENCOUNTER — Inpatient Hospital Stay: Payer: Medicaid Other | Attending: Internal Medicine

## 2018-01-06 DIAGNOSIS — E538 Deficiency of other specified B group vitamins: Secondary | ICD-10-CM | POA: Diagnosis present

## 2018-01-06 MED ORDER — CYANOCOBALAMIN 1000 MCG/ML IJ SOLN
1000.0000 ug | Freq: Once | INTRAMUSCULAR | Status: AC
  Administered 2018-01-06: 1000 ug via INTRAMUSCULAR

## 2018-02-03 ENCOUNTER — Inpatient Hospital Stay: Payer: Medicaid Other

## 2018-02-03 ENCOUNTER — Inpatient Hospital Stay: Payer: Medicaid Other | Attending: Internal Medicine

## 2018-02-03 DIAGNOSIS — E538 Deficiency of other specified B group vitamins: Secondary | ICD-10-CM

## 2018-02-03 DIAGNOSIS — Z79899 Other long term (current) drug therapy: Secondary | ICD-10-CM | POA: Diagnosis not present

## 2018-02-03 MED ORDER — CYANOCOBALAMIN 1000 MCG/ML IJ SOLN
1000.0000 ug | Freq: Once | INTRAMUSCULAR | Status: AC
  Administered 2018-02-03: 1000 ug via INTRAMUSCULAR

## 2018-03-03 ENCOUNTER — Inpatient Hospital Stay: Payer: Medicaid Other | Attending: Internal Medicine

## 2018-03-03 ENCOUNTER — Inpatient Hospital Stay: Payer: Medicaid Other

## 2018-03-03 DIAGNOSIS — E538 Deficiency of other specified B group vitamins: Secondary | ICD-10-CM | POA: Diagnosis not present

## 2018-03-03 DIAGNOSIS — Z79899 Other long term (current) drug therapy: Secondary | ICD-10-CM | POA: Diagnosis not present

## 2018-03-03 MED ORDER — CYANOCOBALAMIN 1000 MCG/ML IJ SOLN
1000.0000 ug | Freq: Once | INTRAMUSCULAR | Status: AC
  Administered 2018-03-03: 1000 ug via INTRAMUSCULAR
  Filled 2018-03-03: qty 1

## 2018-03-31 ENCOUNTER — Inpatient Hospital Stay: Payer: Medicaid Other

## 2018-03-31 ENCOUNTER — Inpatient Hospital Stay: Payer: Medicaid Other | Attending: Internal Medicine

## 2018-03-31 DIAGNOSIS — E538 Deficiency of other specified B group vitamins: Secondary | ICD-10-CM | POA: Insufficient documentation

## 2018-03-31 DIAGNOSIS — Z79899 Other long term (current) drug therapy: Secondary | ICD-10-CM | POA: Insufficient documentation

## 2018-04-02 ENCOUNTER — Inpatient Hospital Stay: Payer: Medicaid Other

## 2018-04-02 DIAGNOSIS — Z79899 Other long term (current) drug therapy: Secondary | ICD-10-CM | POA: Diagnosis not present

## 2018-04-02 DIAGNOSIS — E538 Deficiency of other specified B group vitamins: Secondary | ICD-10-CM | POA: Diagnosis present

## 2018-04-02 MED ORDER — CYANOCOBALAMIN 1000 MCG/ML IJ SOLN
1000.0000 ug | Freq: Once | INTRAMUSCULAR | Status: AC
  Administered 2018-04-02: 1000 ug via INTRAMUSCULAR
  Filled 2018-04-02: qty 1

## 2018-04-05 ENCOUNTER — Telehealth: Payer: Self-pay | Admitting: Obstetrics and Gynecology

## 2018-04-05 DIAGNOSIS — N938 Other specified abnormal uterine and vaginal bleeding: Secondary | ICD-10-CM

## 2018-04-05 MED ORDER — NORETHINDRONE 0.35 MG PO TABS: 1.0000 | ORAL_TABLET | Freq: Every day | ORAL | 5 refills | Status: DC

## 2018-04-05 NOTE — Telephone Encounter (Signed)
Patient called requesting a refill on birth control sent to [facility] on [address]. Thanks

## 2018-04-06 NOTE — Telephone Encounter (Signed)
Pt was called and informed that she needed to make an appointment for an annual exam due to her last annual with [facility] was in May 2018. Pt made an appointment today for that visit.

## 2018-04-06 NOTE — Telephone Encounter (Signed)
Pt was called and informed that she needed to make an appointment for an annual e

## 2018-04-28 ENCOUNTER — Inpatient Hospital Stay: Payer: Medicaid Other | Attending: Internal Medicine

## 2018-04-28 ENCOUNTER — Inpatient Hospital Stay: Payer: Medicaid Other

## 2018-04-28 DIAGNOSIS — E538 Deficiency of other specified B group vitamins: Secondary | ICD-10-CM | POA: Diagnosis present

## 2018-04-28 MED ORDER — CYANOCOBALAMIN 1000 MCG/ML IJ SOLN
1000.0000 ug | Freq: Once | INTRAMUSCULAR | Status: AC
  Administered 2018-04-28: 1000 ug via INTRAMUSCULAR
  Filled 2018-04-28: qty 1

## 2018-05-11 ENCOUNTER — Encounter: Payer: Self-pay | Admitting: Obstetrics and Gynecology

## 2018-05-11 ENCOUNTER — Ambulatory Visit (INDEPENDENT_AMBULATORY_CARE_PROVIDER_SITE_OTHER): Payer: Medicaid Other | Admitting: Obstetrics and Gynecology

## 2018-05-11 ENCOUNTER — Other Ambulatory Visit: Payer: Self-pay

## 2018-05-11 VITALS — BP 101/69 | HR 67 | Ht 64.0 in | Wt 125.5 lb

## 2018-05-11 DIAGNOSIS — Z01419 Encounter for gynecological examination (general) (routine) without abnormal findings: Secondary | ICD-10-CM

## 2018-05-11 DIAGNOSIS — N938 Other specified abnormal uterine and vaginal bleeding: Secondary | ICD-10-CM

## 2018-05-11 DIAGNOSIS — D251 Intramural leiomyoma of uterus: Secondary | ICD-10-CM | POA: Diagnosis not present

## 2018-05-11 DIAGNOSIS — N368 Other specified disorders of urethra: Secondary | ICD-10-CM

## 2018-05-11 DIAGNOSIS — Z0000 Encounter for general adult medical examination without abnormal findings: Secondary | ICD-10-CM

## 2018-05-11 DIAGNOSIS — N951 Menopausal and female climacteric states: Secondary | ICD-10-CM | POA: Diagnosis not present

## 2018-05-11 MED ORDER — NORETHINDRONE 0.35 MG PO TABS: 1.0000 | ORAL_TABLET | Freq: Every day | ORAL | 4 refills | Status: DC

## 2018-05-11 MED ORDER — NORETHINDRONE 0.35 MG PO TABS: 1.0000 | ORAL_TABLET | Freq: Every day | ORAL | 11 refills | Status: DC

## 2018-05-11 NOTE — Patient Instructions (Signed)

## 2018-05-11 NOTE — Progress Notes (Signed)
Subjective:    Melissa Pruitt is a 43 y.o. female who presents for an annual exam.  The patient has no complaints today.The patient is sexually a ctive. The patient wears seatbelts: yes. The patient participates in regular exercise: yes. Has the patient ever been transfused or tattooed?: no. The patient reports that there is not domestic violence in her life.    Gynecologic History: Menarche age: 36 No LMP recorded.   Patient is not currently having periods (Reason: Perimenopausal, on progesterone OCPs for management of DUB, notes absent menses). 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 Last pap smear 08/27/2016.  History of abnormal mammogram: no.  Last mammogram 06/13/2015.    Past Medical History:  Diagnosis Date  . Abnormal Pap smear of cervix   . Cervical cancer [facility])    removed in July 2009  . History of recurrent UTIs   . HPV (human papilloma virus) infection   . HSV (herpes simplex virus) infection   . 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 Normocytic anemia 08/21/2014  . 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 Vitamin B 12 deficiency 08/21/2014    Family History  Problem Relation Age of Onset  . Hypertension Mother   . Atrial fibrillation Mother   . Cervical cancer Maternal Grandmother   . Colon cancer Neg Hx   . Breast cancer Neg Hx     Past Surgical History:  Procedure Laterality Date  . BACK SURGERY    . BLADDER SURGERY    . COLOSTOMY     after flex sig as 43 year old.   . colostomy reversal    . EXPLORATORY LAPAROTOMY     with colostomy placement  . LEEP    . removal of cervical cancer    . TUBAL LIGATION      Social History   Socioeconomic History  . Marital status: Divorced    Spouse name: Not on file  . Number of children: 3  . Years of education: Not on file  . Highest education level: Not on  file  Occupational History  . Occupation: disability    Employer: [employer]  . Financial resource strain: Not on file  . Food insecurity:    Worry: Not on file    Inability: Not on file  . Transportation needs:    Medical: Not on file    Non-medical: Not on file  Tobacco Use  . Smoking status: Former Smoker    Types: Cigarettes    Last attempt to quit: 07/14/2011    Years since quitting: 6.8  . Smokeless tobacco: Never Used  . Tobacco comment: 1-3 cigarettes  Substance and Sexual Activity  . Alcohol use: Yes    Comment: a beer here and there  . Drug use: No  . Sexual activity: Yes    Birth control/protection: Pill, Condom  Lifestyle  . Physical activity:    Days per week: Not on file    Minutes per session: Not on file  . Stress: Not on file  Relationships  . Social connections:    Talks on phone: Not on file    Gets together: Not on file    Attends religious service: Not on file    Active member of club or organization: Not on file    Attends meetings of clubs or  organizations: Not on file    Relationship status: Not on file  . Intimate partner violence:    Fear of current or ex partner: Not on file    Emotionally abused: Not on file    Physically abused: Not on file    Forced sexual activity: Not on file  Other Topics Concern  . Not on file  Social History Narrative  . Not on file    Current Outpatient Medications on File Prior to Visit  Medication Sig Dispense Refill  . cyanocobalamin (,VITAMIN B-12,) 1000 MCG/ML injection Inject into the muscle.    . diclofenac sodium (VOLTAREN) 1 % GEL Apply 1 application topically 4 (four) times daily as needed. arthritis    . EPINEPHrine (EPIPEN 2-PAK) 0.3 mg/0.3 mL IJ SOAJ injection Inject into the muscle once.    . fluticasone (FLONASE) 50 MCG/ACT nasal spray Place 2 sprays into the nose daily.    Marland Kitchen lidocaine (LIDODERM) 5 % Place 1 patch onto the skin daily. Remove & Discard patch within 12 hours or as directed  by MD     . loratadine (CLARITIN) 10 MG tablet Take 10 mg by mouth daily.      . methocarbamol (ROBAXIN) 750 MG tablet Take 750 mg by mouth 3 (three) times daily.    . montelukast (SINGULAIR) 10 MG tablet Take 10 mg by mouth at bedtime.     No current facility-administered medications on file prior to visit.     Allergies  Allergen Reactions  . Ciprofloxacin Anaphylaxis    Heart and nerve issues  . Macrobid [Nitrofurantoin Monohyd Macro] Anaphylaxis, Hives and Shortness Of Breath  . Morphine And Related Anaphylaxis  . Azithromycin Nausea And Vomiting  . Dilaudid [Hydromorphone Hcl] Other (See Comments)    Makes patient feel like she out of it  . Lactose     Other reaction(s): Other (See Comments) Digestive issues/pain  . Prednisolone Other (See Comments)    Chest pain, left arm and hand tingling, eye pain, and mania  . Prednisone Other (See Comments)  . Sulfa Antibiotics Nausea And Vomiting  . Tramadol Hcl     REACTION: HEART PALPITATIONS /SWELLING /SOB  . Pork-Derived Products Nausea And Vomiting     Review of Systems Constitutional: negative for chills, fatigue, fevers and sweats Eyes: negative for irritation, redness and visual disturbance Ears, nose, mouth, throat, and face: negative for hearing loss, nasal congestion, snoring and tinnitus Respiratory: negative for asthma, cough, sputum Cardiovascular: negative for chest pain, dyspnea, exertional chest pressure/discomfort, irregular heart beat, palpitations and syncope Gastrointestinal: negative for abdominal pain, change in bowel habits, nausea and vomiting.  Positive for GI discomfort today, thinks it was something she ate last night.  Genitourinary: negative for abnormal menstrual periods, genital lesions, sexual problems and vaginal discharge, dysuria and urinary incontinence Integument/breast: negative for breast lump, breast tenderness and nipple discharge Hematologic/lymphatic: negative for bleeding and easy  bruising Musculoskeletal:negative for back pain and muscle weakness Neurological: negative for dizziness, headaches, vertigo and weaknessEndocrine: negative for diabetic symptoms including polydipsia, polyuria and skin dryness Allergic/Immunologic: negative for hay fever and urticaria       Objective:   BP 101/69   Pulse 67   Ht 5\' 4"  (1.626 m)   Wt 125 lb 8 oz (56.9 kg)   BMI 21.54 kg/m   General Appearance:    Alert, cooperative, no distress, appears stated age  Head:    Normocephalic, without obvious abnormality, atraumatic  Eyes:    PERRL, conjunctiva/corneas clear, EOM's  intact, both eyes  Ears:    Normal TM's and external ear canals, both ears  Nose:   Nares normal, septum midline, mucosa normal, no drainage or sinus tenderness  Throat:   Lips, mucosa, and tongue normal; teeth and gums normal  Neck:   Supple, symmetrical, trachea midline, no adenopathy;   thyroid:  no enlargement/tenderness/nodules; no carotid bruit or JVD  Back:     Symmetric, no curvature, ROM normal, no CVA tenderness  Lungs:     Clear to auscultation bilaterally, respirations unlabored   Chest Wall:    No tenderness or deformity.    Heart:    Regular rate and rhythm, S1 and S2 normal, no murmur, rub   or gallop  Breast Exam:    No tenderness, masses, or nipple abnormality  Abdomen:     Soft, non-tender, bowel sounds active all four quadrants,   no masses, no organomegaly  Genitalia:    Pelvic: external genitalia normal, except small brown-black mole with irregular contours, ~ rectovaginal septum normal.  Sub-urethral cyst (~ 2-3 cm) present, visible at introitus. Vagina without vaginal discharge.  Cervix with post-LEEP changes, no lesions and no motion tenderness.  Uterus mobile, nontender, normal shape and size.  Adnexae non-palpable, nontender bilaterally.   Rectal:    Normal tone, no masses or tenderness;   guaiac negative stool  Extremities:   Extremities normal, atraumatic, no cyanosis or edema  Pulses:    2+ and symmetric all extremities  Skin:   Skin color, texture, turgor normal, no rashes or lesions  Lymph nodes:   Cervical, supraclavicular, and axillary nodes normal  Neurologic:   CNII-XII intact, normal strength, sensation and reflexes throughout     Labs:  Lab Results  Component Value Date   WBC 5.5 12/09/2017   HGB 13.3 12/09/2017   HCT 38.8 12/09/2017   MCV 90.1 12/09/2017   PLT 229 12/09/2017    Lab Results  Component Value Date   CREATININE 0.60 12/09/2017   BUN 13 12/09/2017   NA 139 12/09/2017   K 4.1 12/09/2017   CL 105 12/09/2017   CO2 25 12/09/2017    Lab Results  Component Value Date   ALT 9 12/09/2017   AST 16 12/09/2017   ALKPHOS 32 (L) 12/09/2017   BILITOT 1.1 12/09/2017    Lab Results  Component Value Date   TSH 2.405 12/09/2017     No results found for: CHOL, HDL, LDLCALC, LDLDIRECT, TRIG, CHOLHDL  (Patient notes she has had done within the past 5 years)  Assessment:   Routine gynecologic exam Perimenopausal  Fibroid uterus Skene's gland cyst History of DUB  Plan:   Blood tests: None ordered. Up to date.  Breast self exam technique reviewed and patient encouraged to perform self-exam monthly. Mammogram ordered.  Pap smear up to date.  Contraception: tubal ligation. Discussed healthy lifestyle modifications. Patient currently on progesterone OCPs for perimenopausal symptoms with h/o fibroid uterus and abnormal bleeding. Refill given. Pelvic exam unremarkable, no concern for notable growth of fibroids.  Declines flu vaccine.  Follow up in 1 year, or sooner as needed. Rubie Maid, MD [facility]

## 2018-05-11 NOTE — Progress Notes (Signed)
PT is present today for her annual exam. Pt stated that she has been doing self-breast exams monthly. Pt stated that she is doing well and denies any issues. No problems or concerns.     

## 2018-05-26 ENCOUNTER — Inpatient Hospital Stay: Payer: Medicaid Other

## 2018-05-27 ENCOUNTER — Inpatient Hospital Stay: Payer: Medicaid Other | Attending: Internal Medicine

## 2018-05-27 DIAGNOSIS — E538 Deficiency of other specified B group vitamins: Secondary | ICD-10-CM

## 2018-05-27 MED ORDER — CYANOCOBALAMIN 1000 MCG/ML IJ SOLN
1000.0000 ug | Freq: Once | INTRAMUSCULAR | Status: AC
  Administered 2018-05-27: 1000 ug via INTRAMUSCULAR
  Filled 2018-05-27: qty 1

## 2018-06-11 ENCOUNTER — Other Ambulatory Visit: Payer: Self-pay | Admitting: Neurology

## 2018-06-11 DIAGNOSIS — R519 Headache, unspecified: Secondary | ICD-10-CM

## 2018-06-11 DIAGNOSIS — R51 Headache: Principal | ICD-10-CM

## 2018-06-17 ENCOUNTER — Ambulatory Visit: Payer: Medicaid Other

## 2018-06-18 ENCOUNTER — Ambulatory Visit
Admission: RE | Admit: 2018-06-18 | Discharge: 2018-06-18 | Disposition: A | Discharge status: Home / Self Care | Payer: Medicaid Other | Source: Ambulatory Visit | Attending: Neurology | Admitting: Neurology

## 2018-06-18 DIAGNOSIS — R51 Headache: Secondary | ICD-10-CM | POA: Diagnosis not present

## 2018-06-18 DIAGNOSIS — R519 Headache, unspecified: Secondary | ICD-10-CM

## 2018-06-23 ENCOUNTER — Inpatient Hospital Stay: Payer: Medicaid Other

## 2018-06-23 ENCOUNTER — Other Ambulatory Visit: Payer: Self-pay

## 2018-06-23 ENCOUNTER — Inpatient Hospital Stay: Payer: Medicaid Other | Attending: Internal Medicine

## 2018-06-23 DIAGNOSIS — E538 Deficiency of other specified B group vitamins: Secondary | ICD-10-CM | POA: Diagnosis present

## 2018-06-23 DIAGNOSIS — Z79899 Other long term (current) drug therapy: Secondary | ICD-10-CM | POA: Insufficient documentation

## 2018-06-23 MED ORDER — CYANOCOBALAMIN 1000 MCG/ML IJ SOLN
1000.0000 ug | Freq: Once | INTRAMUSCULAR | Status: AC
  Administered 2018-06-23: 1000 ug via INTRAMUSCULAR
  Filled 2018-06-23: qty 1

## 2018-07-20 ENCOUNTER — Other Ambulatory Visit: Payer: Self-pay

## 2018-07-21 ENCOUNTER — Inpatient Hospital Stay: Payer: Medicaid Other

## 2018-08-17 ENCOUNTER — Telehealth: Payer: Self-pay | Admitting: *Deleted

## 2018-08-17 NOTE — Telephone Encounter (Signed)
Patient requested that all cancer center appts be cancelled due to purchasing B12 tablets. Appointments cancelled, care team notified, and patient is aware to contact us to reschedule if needed.

## 2018-08-18 ENCOUNTER — Inpatient Hospital Stay: Payer: Medicaid Other

## 2018-09-15 ENCOUNTER — Inpatient Hospital Stay: Payer: Medicaid Other

## 2018-10-13 ENCOUNTER — Inpatient Hospital Stay: Payer: Medicaid Other

## 2018-11-02 ENCOUNTER — Other Ambulatory Visit (HOSPITAL_COMMUNITY): Payer: Self-pay | Admitting: Neurology

## 2018-11-02 ENCOUNTER — Other Ambulatory Visit: Payer: Self-pay | Admitting: Neurology

## 2018-11-02 DIAGNOSIS — H93A3 Pulsatile tinnitus, bilateral: Secondary | ICD-10-CM

## 2018-11-02 DIAGNOSIS — M5412 Radiculopathy, cervical region: Secondary | ICD-10-CM

## 2018-11-05 DIAGNOSIS — E8941 Symptomatic postprocedural ovarian failure: Secondary | ICD-10-CM | POA: Insufficient documentation

## 2018-11-05 DIAGNOSIS — G500 Trigeminal neuralgia: Secondary | ICD-10-CM | POA: Insufficient documentation

## 2018-11-10 ENCOUNTER — Inpatient Hospital Stay: Payer: Medicaid Other

## 2018-11-13 ENCOUNTER — Ambulatory Visit: Payer: Medicaid Other

## 2018-11-17 ENCOUNTER — Ambulatory Visit: Payer: Medicaid Other

## 2018-11-17 ENCOUNTER — Other Ambulatory Visit: Payer: Medicaid Other

## 2018-11-18 ENCOUNTER — Other Ambulatory Visit: Payer: Self-pay | Admitting: Nurse Practitioner

## 2018-11-18 DIAGNOSIS — M79602 Pain in left arm: Secondary | ICD-10-CM

## 2018-11-20 ENCOUNTER — Other Ambulatory Visit: Payer: Self-pay

## 2018-11-20 ENCOUNTER — Ambulatory Visit
Admission: RE | Admit: 2018-11-20 | Discharge: 2018-11-20 | Disposition: A | Discharge status: Home / Self Care | Payer: Medicaid Other | Source: Ambulatory Visit | Attending: Neurology | Admitting: Neurology

## 2018-11-20 DIAGNOSIS — M5412 Radiculopathy, cervical region: Secondary | ICD-10-CM | POA: Diagnosis present

## 2018-11-20 DIAGNOSIS — H93A3 Pulsatile tinnitus, bilateral: Secondary | ICD-10-CM | POA: Diagnosis present

## 2018-11-20 MED ORDER — GADOBUTROL 1 MMOL/ML IV SOLN
5.0000 mL | Freq: Once | INTRAVENOUS | Status: AC | PRN
  Administered 2018-11-20: 11:00:00 5 mL via INTRAVENOUS

## 2018-11-22 ENCOUNTER — Ambulatory Visit: Payer: Medicaid Other

## 2018-11-23 ENCOUNTER — Other Ambulatory Visit: Payer: Self-pay | Admitting: Nurse Practitioner

## 2018-11-23 ENCOUNTER — Other Ambulatory Visit: Payer: Self-pay

## 2018-11-23 ENCOUNTER — Ambulatory Visit
Admission: RE | Admit: 2018-11-23 | Discharge: 2018-11-23 | Disposition: A | Discharge status: Home / Self Care | Payer: Medicaid Other | Source: Ambulatory Visit | Attending: Nurse Practitioner | Admitting: Nurse Practitioner

## 2018-11-23 DIAGNOSIS — R202 Paresthesia of skin: Secondary | ICD-10-CM | POA: Diagnosis present

## 2018-11-23 DIAGNOSIS — R200 Anesthesia of skin: Secondary | ICD-10-CM | POA: Insufficient documentation

## 2018-11-23 DIAGNOSIS — M79602 Pain in left arm: Secondary | ICD-10-CM | POA: Insufficient documentation

## 2018-12-10 ENCOUNTER — Ambulatory Visit: Payer: Medicaid Other

## 2018-12-10 ENCOUNTER — Other Ambulatory Visit: Payer: Medicaid Other

## 2018-12-10 ENCOUNTER — Ambulatory Visit: Payer: Medicaid Other | Admitting: Internal Medicine

## 2019-05-12 NOTE — Progress Notes (Signed)
Pt present for annual exam. Pt stated that she was doing well no problems. Pt declined flu vaccine. Pt stated her cycle is really heavy and unsure if it from the antibiotic that she just finished. Pt stated having bleeding with sex.

## 2019-05-13 ENCOUNTER — Other Ambulatory Visit: Payer: Self-pay

## 2019-05-13 ENCOUNTER — Encounter: Payer: Self-pay | Admitting: Obstetrics and Gynecology

## 2019-05-13 ENCOUNTER — Ambulatory Visit (INDEPENDENT_AMBULATORY_CARE_PROVIDER_SITE_OTHER): Payer: Medicaid Other | Admitting: Obstetrics and Gynecology

## 2019-05-13 VITALS — BP 84/52 | HR 60 | Ht 64.0 in | Wt 118.0 lb

## 2019-05-13 DIAGNOSIS — Z01419 Encounter for gynecological examination (general) (routine) without abnormal findings: Secondary | ICD-10-CM | POA: Diagnosis not present

## 2019-05-13 DIAGNOSIS — D251 Intramural leiomyoma of uterus: Secondary | ICD-10-CM

## 2019-05-13 DIAGNOSIS — Z0000 Encounter for general adult medical examination without abnormal findings: Secondary | ICD-10-CM | POA: Diagnosis not present

## 2019-05-13 DIAGNOSIS — N938 Other specified abnormal uterine and vaginal bleeding: Secondary | ICD-10-CM

## 2019-05-13 DIAGNOSIS — Z1231 Encounter for screening mammogram for malignant neoplasm of breast: Secondary | ICD-10-CM | POA: Diagnosis not present

## 2019-05-13 DIAGNOSIS — N368 Other specified disorders of urethra: Secondary | ICD-10-CM

## 2019-05-13 NOTE — Progress Notes (Signed)
Subjective:    Melissa Pruitt is a 44 y.o. G3P3 female who presents for an annual exam.   The patient is sexually a ctive. The patient wears seatbelts: yes. The patient participates in regular exercise: yes. Has the patient ever been transfused or tattooed?: no. The patient reports that there is not domestic violence in her life.   The patient has the following complaints today: 1. Notes that she has been dealing with recurrent UTI's for several months.  Has been on several antibiotics.  2. Notes that her most recent cycles have been really heavy, and is unsure if it is due to her use of the frequent antibiotics. Desires to talk about other birth control options. Is currently on Micronor. Has a history of DUB and fibroids in the past, has been managed for several years on current OCP.   Gynecologic History: Menarche age: 60 Patient's last menstrual period was 05/09/2019. 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 Last pap smear 08/27/2016.  History of abnormal mammogram: no.  Last mammogram 06/13/2015.     OB History  Gravida Para Term Preterm AB Living  3 3       3   SAB TAB Ectopic Multiple Live Births          3    # Outcome Date GA Lbr Len/2nd Weight Sex Delivery Anes PTL Lv  3 Para      Vag-Spont   LIV  2 Para      Vag-Spont   LIV  1 Para      Vag-Spont   LIV    Past Medical History:  Diagnosis Date  . Abnormal Pap smear of cervix   . Cervical cancer [facility])    removed in July 2009  . History of recurrent UTIs   . HPV (human papilloma virus) infection   . HSV (herpes simplex virus) infection   . 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 Normocytic anemia 08/21/2014  . 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 Vitamin B 12 deficiency 08/21/2014    Family History  Problem Relation Age of Onset  . Hypertension Mother   . Atrial  fibrillation Mother   . Cervical cancer Maternal Grandmother   . Colon cancer Neg Hx   . Breast cancer Neg Hx     Past Surgical History:  Procedure Laterality Date  . BACK SURGERY    . BLADDER SURGERY    . COLOSTOMY     after flex sig as 44 year old.   . colostomy reversal    . EXPLORATORY LAPAROTOMY     with colostomy placement  . LEEP    . removal of cervical cancer    . TUBAL LIGATION      Social History   Socioeconomic History  . Marital status: Divorced    Spouse name: Not on file  . Number of children: 3  . Years of education: Not on file  . Highest education level: Not on file  Occupational History  . Occupation: disability    Employer: [employer]  Tobacco Use  . Smoking status: Former Smoker    Types: Cigarettes    Quit date: 07/14/2011    Years since quitting: 7.8  . Smokeless tobacco: Never Used  . Tobacco comment: 1-3 cigarettes  Substance and Sexual Activity  . Alcohol use: Yes  Comment: a beer here and there  . Drug use: No  . Sexual activity: Yes    Birth control/protection: Pill, Condom  Other Topics Concern  . Not on file  Social History Narrative  . Not on file   Social Determinants of Health   Financial Resource Strain:   . Difficulty of Paying Living Expenses: Not on file  Food Insecurity:   . Worried About Charity fundraiser in the Last Year: Not on file  . Ran Out of Food in the Last Year: Not on file  Transportation Needs:   . Lack of Transportation (Medical): Not on file  . Lack of Transportation (Non-Medical): Not on file  Physical Activity:   . Days of Exercise per Week: Not on file  . Minutes of Exercise per Session: Not on file  Stress:   . Feeling of Stress : Not on file  Social Connections:   . Frequency of Communication with Friends and Family: Not on file  . Frequency of Social Gatherings with Friends and Family: Not on file  . Attends Religious Services: Not on file  . Active Member of Clubs or Organizations: Not on file    . Attends Archivist Meetings: Not on file  . Marital Status: Not on file  Intimate Partner Violence:   . Fear of Current or Ex-Partner: Not on file  . Emotionally Abused: Not on file  . Physically Abused: Not on file  . Sexually Abused: Not on file    Current Outpatient Medications on File Prior to Visit  Medication Sig Dispense Refill  . Cyanocobalamin (VITAMIN B 12 PO) Take by mouth.    . diclofenac sodium (VOLTAREN) 1 % GEL Apply 1 application topically 4 (four) times daily as needed. arthritis    . EPINEPHrine (EPIPEN 2-PAK) 0.3 mg/0.3 mL IJ SOAJ injection Inject into the muscle once.    . fluticasone (FLONASE) 50 MCG/ACT nasal spray Place 2 sprays into the nose daily.    Marland Kitchen lidocaine (LIDODERM) 5 % Place 1 patch onto the skin daily. Remove & Discard patch within 12 hours or as directed by MD     . loratadine (CLARITIN) 10 MG tablet Take 10 mg by mouth daily.      . methocarbamol (ROBAXIN) 750 MG tablet Take 750 mg by mouth 3 (three) times daily.    . montelukast (SINGULAIR) 10 MG tablet Take 10 mg by mouth at bedtime.    . norethindrone (MICRONOR,CAMILA,ERRIN) 0.35 MG tablet Take 1 tablet (0.35 mg total) by mouth daily. Pt needs to make an appointment in May 2020 for an annual exam. 3 Package 4   No current facility-administered medications on file prior to visit.    Allergies  Allergen Reactions  . Ciprofloxacin Anaphylaxis    Heart and nerve issues  . Macrobid [Nitrofurantoin Monohyd Macro] Anaphylaxis, Hives and Shortness Of Breath  . Morphine And Related Anaphylaxis  . Azithromycin Nausea And Vomiting  . Dilaudid [Hydromorphone Hcl] Other (See Comments)    Makes patient feel like she out of it  . Lactose     Other reaction(s): Other (See Comments) Digestive issues/pain  . Prednisolone Other (See Comments)    Chest pain, left arm and hand tingling, eye pain, and mania  . Prednisone Other (See Comments)  . Sulfa Antibiotics Nausea And Vomiting  . Tramadol  Hcl     REACTION: HEART PALPITATIONS /SWELLING /SOB  . Pork-Derived Products Nausea And Vomiting     Review of Systems Constitutional: negative  for chills, fatigue, fevers and sweats Eyes: negative for irritation, redness and visual disturbance Ears, nose, mouth, throat, and face: negative for hearing loss, nasal congestion, snoring and tinnitus Respiratory: negative for asthma, cough, sputum Cardiovascular: negative for chest pain, dyspnea, exertional chest pressure/discomfort, irregular heart beat, palpitations and syncope Gastrointestinal: negative for abdominal pain, change in bowel habits, nausea and vomiting.   Genitourinary: positive for abnormal menstrual periods and recurrent UTI's (see HPI), genital lesions, sexual problems and vaginal discharge, dysuria and urinary incontinence Integument/breast: negative for breast lump, breast tenderness and nipple discharge Hematologic/lymphatic: negative for bleeding and easy bruising Musculoskeletal:negative for back pain and muscle weakness Neurological: negative for dizziness, headaches, vertigo and weaknessEndocrine: negative for diabetic symptoms including polydipsia, polyuria and skin dryness Allergic/Immunologic: negative for hay fever and urticaria       Objective:   BP (!) 84/52   Pulse 60   Ht 5\' 4"  (1.626 m)   Wt 118 lb (53.5 kg)   LMP 05/09/2019   BMI 20.25 kg/m   General Appearance:    Alert, cooperative, no distress, appears stated age  Head:    Normocephalic, without obvious abnormality, atraumatic  Eyes:    PERRL, conjunctiva/corneas clear, EOM's intact, both eyes  Ears:    Normal TM's and external ear canals, both ears  Nose:   Nares normal, septum midline, mucosa normal, no drainage or sinus tenderness  Throat:   Lips, mucosa, and tongue normal; teeth and gums normal  Neck:   Supple, symmetrical, trachea midline, no adenopathy;   thyroid:  no enlargement/tenderness/nodules; no carotid bruit or JVD  Back:      Symmetric, no curvature, ROM normal, no CVA tenderness  Lungs:     Clear to auscultation bilaterally, respirations unlabored   Chest Wall:    No tenderness or deformity.    Heart:    Regular rate and rhythm, S1 and S2 normal, no murmur, rub   or gallop  Breast Exam:    No tenderness, masses, or nipple abnormality  Abdomen:     Soft, non-tender, bowel sounds active all four quadrants,   no masses, no organomegaly  Genitalia:    Pelvic: external genitalia normal, except small brown-black mole with irregular contours, ~ rectovaginal septum normal.  Sub-urethral cyst (~ 2-3 cm) present, visible at introitus. Vagina without vaginal discharge.  Cervix with post-LEEP changes, no lesions and no motion tenderness.  Uterus mobile, nontender, normal shape and size.  Adnexae non-palpable, nontender bilaterally.   Rectal:    Normal tone, no masses or tenderness;   guaiac negative stool  Extremities:   Extremities normal, atraumatic, no cyanosis or edema  Pulses:   2+ and symmetric all extremities  Skin:   Skin color, texture, turgor normal, no rashes or lesions  Lymph nodes:   Cervical, supraclavicular, and axillary nodes normal  Neurologic:   CNII-XII intact, normal strength, sensation and reflexes throughout     Labs:  Lab Results  Component Value Date   WBC 5.5 12/09/2017   HGB 13.3 12/09/2017   HCT 38.8 12/09/2017   MCV 90.1 12/09/2017   PLT 229 12/09/2017    Lab Results  Component Value Date   CREATININE 0.60 12/09/2017   BUN 13 12/09/2017   NA 139 12/09/2017   K 4.1 12/09/2017   CL 105 12/09/2017   CO2 25 12/09/2017    Lab Results  Component Value Date   ALT 9 12/09/2017   AST 16 12/09/2017   ALKPHOS 32 (L) 12/09/2017   BILITOT 1.1  12/09/2017    Lab Results  Component Value Date   TSH 2.405 12/09/2017     No results found for: CHOL, HDL, LDLCALC, LDLDIRECT, TRIG, CHOLHDL    Assessment:   Routine gynecologic exam Fibroid uterus Skene's gland cyst History of  DUB Recurrent UTIs  Plan:   Blood tests: Plans to have labs with PCP.  Breast self exam technique reviewed and patient encouraged to perform self-exam monthly. Mammogram ordered.  Pap smear performed today.   Contraception: tubal ligation. Discussed healthy lifestyle modifications. Patient currently on progesterone OCPs for perimenopausal symptoms with h/o fibroid uterus and abnormal bleeding. Has not had an ultrasound since 2017.  Will follow up. If endometrial biopsy is warranted, will perform at next visit. Also discussed option to try different progesterone OCP or IUD. Given sample of Slynd.  Recurrent UTI's, patient has been seen by PCP and Urology. DUe to see Urology again soon.  Declines flu vaccine.  Follow up in 1 year, or sooner as needed. Rubie Maid, MD [facility]

## 2019-05-13 NOTE — Patient Instructions (Signed)
Preventive Care 40-46 Years Old, Female Preventive care refers to visits with your health care provider and lifestyle choices that can promote health and wellness. This includes:  A yearly physical exam. This may also be called an annual well check.  Regular dental visits and eye exams.  Immunizations.  Screening for certain conditions.  Healthy lifestyle choices, such as eating a healthy diet, getting regular exercise, not using drugs or products that contain nicotine and tobacco, and limiting alcohol use. What can I expect for my preventive care visit? Physical exam Your health care provider will check your:  Height and weight. This may be used to calculate body mass index (BMI), which tells if you are at a healthy weight.  Heart rate and blood pressure.  Skin for abnormal spots. Counseling Your health care provider may ask you questions about your:  Alcohol, tobacco, and drug use.  Emotional well-being.  Home and relationship well-being.  Sexual activity.  Eating habits.  Work and work environment.  Method of birth control.  Menstrual cycle.  Pregnancy history. What immunizations do I need?  Influenza (flu) vaccine  This is recommended every year. Tetanus, diphtheria, and pertussis (Tdap) vaccine  You may need a Td booster every 10 years. Varicella (chickenpox) vaccine  You may need this if you have not been vaccinated. Zoster (shingles) vaccine  You may need this after age 60. Measles, mumps, and rubella (MMR) vaccine  You may need at least one dose of MMR if you were born in 1957 or later. You may also need a second dose. Pneumococcal conjugate (PCV13) vaccine  You may need this if you have certain conditions and were not previously vaccinated. Pneumococcal polysaccharide (PPSV23) vaccine  You may need one or two doses if you smoke cigarettes or if you have certain conditions. Meningococcal conjugate (MenACWY) vaccine  You may need this if you  have certain conditions. Hepatitis A vaccine  You may need this if you have certain conditions or if you travel or work in places where you may be exposed to hepatitis A. Hepatitis B vaccine  You may need this if you have certain conditions or if you travel or work in places where you may be exposed to hepatitis B. Haemophilus influenzae type b (Hib) vaccine  You may need this if you have certain conditions. Human papillomavirus (HPV) vaccine  If recommended by your health care provider, you may need three doses over 6 months. You may receive vaccines as individual doses or as more than one vaccine together in one shot (combination vaccines). Talk with your health care provider about the risks and benefits of combination vaccines. What tests do I need? Blood tests  Lipid and cholesterol levels. These may be checked every 5 years, or more frequently if you are over 50 years old.  Hepatitis C test.  Hepatitis B test. Screening  Lung cancer screening. You may have this screening every year starting at age 55 if you have a 30-pack-year history of smoking and currently smoke or have quit within the past 15 years.  Colorectal cancer screening. All adults should have this screening starting at age 50 and continuing until age 75. Your health care provider may recommend screening at age 45 if you are at increased risk. You will have tests every 1-10 years, depending on your results and the type of screening test.  Diabetes screening. This is done by checking your blood sugar (glucose) after you have not eaten for a while (fasting). You may have this   done every 1-3 years.  Mammogram. This may be done every 1-2 years. Talk with your health care provider about when you should start having regular mammograms. This may depend on whether you have a family history of breast cancer.  BRCA-related cancer screening. This may be done if you have a family history of breast, ovarian, tubal, or peritoneal  cancers.  Pelvic exam and Pap test. This may be done every 3 years starting at age 60. Starting at age 7, this may be done every 5 years if you have a Pap test in combination with an HPV test. Other tests  Sexually transmitted disease (STD) testing.  Bone density scan. This is done to screen for osteoporosis. You may have this scan if you are at high risk for osteoporosis. Follow these instructions at home: Eating and drinking  Eat a diet that includes fresh fruits and vegetables, whole grains, lean protein, and low-fat dairy.  Take vitamin and mineral supplements as recommended by your health care provider.  Do not drink alcohol if: ? Your health care provider tells you not to drink. ? You are pregnant, may be pregnant, or are planning to become pregnant.  If you drink alcohol: ? Limit how much you have to 0-1 drink a day. ? Be aware of how much alcohol is in your drink. In the [place] one drink equals one 12 oz bottle of beer (355 mL), one 5 oz glass of wine (148 mL), or one 1 oz glass of hard liquor (44 mL). Lifestyle  Take daily care of your teeth and gums.  Stay active. Exercise for at least 30 minutes on 5 or more days each week.  Do not use any products that contain nicotine or tobacco, such as cigarettes, e-cigarettes, and chewing tobacco. If you need help quitting, ask your health care provider.  If you are sexually active, practice safe sex. Use a condom or other form of birth control (contraception) in order to prevent pregnancy and STIs (sexually transmitted infections).  If told by your health care provider, take low-dose aspirin daily starting at age 48. What's next?  Visit your health care provider once a year for a well check visit.  Ask your health care provider how often you should have your eyes and teeth checked.  Stay up to date on all vaccines. This information is not intended to replace advice given to you by your health care provider. Make sure you  discuss any questions you have with your health care provider. Document Revised: 12/10/2017 Document Reviewed: 12/10/2017 Elsevier Patient Education  2020 [place] Breast self-awareness is knowing how your breasts look and feel. Doing breast self-awareness is important. It allows you to catch a breast problem early while it is still small and can be treated. All women should do breast self-awareness, including women who have had breast implants. Tell your doctor if you notice a change in your breasts. What you need:  A mirror.  A well-lit room. How to do a breast self-exam A breast self-exam is one way to learn what is normal for your breasts and to check for changes. To do a breast self-exam: Look for changes  1. Take off all the clothes above your waist. 2. Stand in front of a mirror in a room with good lighting. 3. Put your hands on your hips. 4. Push your hands down. 5. Look at your breasts and nipples in the mirror to see if one breast or nipple looks different from the  other. Check to see if: ? The shape of one breast is different. ? The size of one breast is different. ? There are wrinkles, dips, and bumps in one breast and not the other. 6. Look at each breast for changes in the skin, such as: ? Redness. ? Scaly areas. 7. Look for changes in your nipples, such as: ? Liquid around the nipples. ? Bleeding. ? Dimpling. ? Redness. ? A change in where the nipples are. Feel for changes  1. Lie on your back on the floor. 2. Feel each breast. To do this, follow these steps: ? Pick a breast to feel. ? Put the arm closest to that breast above your head. ? Use your other arm to feel the nipple area of your breast. Feel the area with the pads of your three middle fingers by making small circles with your fingers. For the first circle, press lightly. For the second circle, press harder. For the third circle, press even harder. ? Keep making circles with  your fingers at the different pressures as you move down your breast. Stop when you feel your ribs. ? Move your fingers a little toward the center of your body. ? Start making circles with your fingers again, this time going up until you reach your collarbone. ? Keep making up-and-down circles until you reach your armpit. Remember to keep using the three pressures. ? Feel the other breast in the same way. 3. Sit or stand in the tub or shower. 4. With soapy water on your skin, feel each breast the same way you did in step 2 when you were lying on the floor. Write down what you find Writing down what you find can help you remember what to tell your doctor. Write down:  What is normal for each breast.  Any changes you find in each breast, including: ? The kind of changes you find. ? Whether you have pain. ? Size and location of any lumps.  When you last had your menstrual period. General tips  Check your breasts every month.  If you are breastfeeding, the best time to check your breasts is after you feed your baby or after you use a breast pump.  If you get menstrual periods, the best time to check your breasts is 5-7 days after your menstrual period is over.  With time, you will become comfortable with the self-exam, and you will begin to know if there are changes in your breasts. Contact a doctor if you:  See a change in the shape or size of your breasts or nipples.  See a change in the skin of your breast or nipples, such as red or scaly skin.  Have fluid coming from your nipples that is not normal.  Find a lump or thick area that was not there before.  Have pain in your breasts.  Have any concerns about your breast health. Summary  Breast self-awareness includes looking for changes in your breasts, as well as feeling for changes within your breasts.  Breast self-awareness should be done in front of a mirror in a well-lit room.  You should check your breasts every month.  If you get menstrual periods, the best time to check your breasts is 5-7 days after your menstrual period is over.  Let your doctor know of any changes you see in your breasts, including changes in size, changes on the skin, pain or tenderness, or fluid from your nipples that is not normal. This information is not  intended to replace advice given to you by your health care provider. Make sure you discuss any questions you have with your health care provider. Document Revised: 11/17/2017 Document Reviewed: 11/17/2017 Elsevier Patient Education  [place].

## 2019-05-14 ENCOUNTER — Encounter: Payer: Self-pay | Admitting: Obstetrics and Gynecology

## 2019-05-24 ENCOUNTER — Encounter: Payer: Medicaid Other | Admitting: Obstetrics and Gynecology

## 2019-05-24 ENCOUNTER — Other Ambulatory Visit: Payer: Medicaid Other

## 2019-05-24 ENCOUNTER — Telehealth: Payer: Self-pay | Admitting: Obstetrics and Gynecology

## 2019-05-24 DIAGNOSIS — M5412 Radiculopathy, cervical region: Secondary | ICD-10-CM | POA: Insufficient documentation

## 2019-05-24 NOTE — Telephone Encounter (Signed)
Pt was called for screening and the pt is requesting a call back from the nurse. The pt stated that she wants to use the norethindrone the pt wants that was her Birth control.  Pt is requesting it to be filled at the [address] [place]  [facility]. Please advise

## 2019-05-25 ENCOUNTER — Other Ambulatory Visit: Payer: Self-pay

## 2019-05-25 ENCOUNTER — Ambulatory Visit (INDEPENDENT_AMBULATORY_CARE_PROVIDER_SITE_OTHER): Payer: Medicaid Other

## 2019-05-25 ENCOUNTER — Other Ambulatory Visit: Payer: Self-pay | Admitting: Obstetrics and Gynecology

## 2019-05-25 DIAGNOSIS — N938 Other specified abnormal uterine and vaginal bleeding: Secondary | ICD-10-CM | POA: Diagnosis not present

## 2019-05-25 DIAGNOSIS — D251 Intramural leiomyoma of uterus: Secondary | ICD-10-CM | POA: Diagnosis not present

## 2019-05-25 NOTE — Telephone Encounter (Signed)
Pt is aware that her medication has [facility] [place].

## 2019-05-26 NOTE — Telephone Encounter (Signed)
Pt called in and stated that she was informed that a refill of her jencycla. Was supposed to be sent in to the [facility] on [address]. Please advise

## 2019-05-27 ENCOUNTER — Other Ambulatory Visit: Payer: Self-pay | Admitting: Obstetrics and Gynecology

## 2019-05-27 DIAGNOSIS — N938 Other specified abnormal uterine and vaginal bleeding: Secondary | ICD-10-CM

## 2019-05-27 MED ORDER — NORETHINDRONE 0.35 MG PO TABS: 1.0000 | ORAL_TABLET | Freq: Every day | ORAL | 3 refills | Status: DC

## 2019-05-27 NOTE — Telephone Encounter (Signed)
I had planned on refilling her prescription on the same day of her visit to discuss her ultrasound results but she cancelled her appointment for this week, which is the reason for the delay.

## 2019-05-27 NOTE — Telephone Encounter (Signed)
[facility] [place] Patient would like a refill on Jencycla. Patient states that the nurse told her the refill would be avail. Frustrated that the medication has not been sent to her current pharmacy. Please call patient

## 2019-06-01 ENCOUNTER — Telehealth: Payer: Self-pay | Admitting: Obstetrics and Gynecology

## 2019-06-01 NOTE — Telephone Encounter (Signed)
All information given to pt by [facility].

## 2019-06-01 NOTE — Telephone Encounter (Signed)
Patient called saying she still hasn't received her pap smear results and said she requested a telemedicine appointment but has not had any confirmation that's the type of visit she'll be having tomorrow. Could yo please advise this patient.  -TC

## 2019-06-01 NOTE — Telephone Encounter (Signed)
Completed no need to call pt all information was given to pt. Just FYI.

## 2019-06-01 NOTE — Telephone Encounter (Addendum)
Pt called in and stated that she was wanting to know if she needs to drive 30 mins tom or is her appt a televist. I told the pt it was a televisit. The pt wants to go over her ultrasound and pap. The pt was very confused and snappy. Please advise

## 2019-06-02 ENCOUNTER — Other Ambulatory Visit: Payer: Self-pay

## 2019-06-02 ENCOUNTER — Encounter: Payer: Self-pay | Admitting: Obstetrics and Gynecology

## 2019-06-02 ENCOUNTER — Ambulatory Visit (INDEPENDENT_AMBULATORY_CARE_PROVIDER_SITE_OTHER): Payer: Medicaid Other | Admitting: Obstetrics and Gynecology

## 2019-06-02 VITALS — Ht 64.0 in | Wt 118.0 lb

## 2019-06-02 DIAGNOSIS — Z124 Encounter for screening for malignant neoplasm of cervix: Secondary | ICD-10-CM | POA: Diagnosis not present

## 2019-06-02 DIAGNOSIS — N938 Other specified abnormal uterine and vaginal bleeding: Secondary | ICD-10-CM | POA: Diagnosis not present

## 2019-06-02 MED ORDER — ESTRADIOL 1 MG PO TABS: 1.0000 mg | ORAL_TABLET | Freq: Every day | ORAL | 2 refills | Status: DC

## 2019-06-02 NOTE — Progress Notes (Signed)
Virtual Visit via Telephone Note  I connected with [name] on 06/02/19 at  1:30 PM EST by telephone and verified that I am speaking with the correct person using two identifiers.   I discussed the limitations, risks, security and privacy concerns of performing an evaluation and management service by telephone and the availability of in person appointments. I also discussed with the patient that there may be a patient responsible charge related to this service. The patient expressed understanding and agreed to proceed.   History of Present Illness: Melissa Pruitt is a 44 y.o. G3P3 who presents for discussion of ultrasound results.  She has a history of dysfunctional uterine bleeding, currently on progesterone OCPs.  Was given sample of Slynd to try to see if new generation OCP would help with her bleeding (currently taking norethindrone). Patient reports that her bleeding was about the same, so she switched back to the Micronor.  She is is still having bleeding, however blood is becoming a darker brown (but reports after splinting to have a bowel movement, the bleeding sometimes turns bright red again).  Denies any other symptoms (SOB, dizziness).    Patient was also informed of need for repeat pap smear due to inability to run most recent specimen collected on 05/13/2019.   Observations/Objective: Height 5\' 4"  (1.626 m), weight 118 lb (53.5 kg), last menstrual period 05/09/2019.    Imaging:  US PELVIS (TRANSABDOMINAL ONLY) Patient Name: Melissa Pruitt DOB: 04/27/1975 MRN: [id number] ULTRASOUND REPORT  Location: [facility]  Date of Service: 05/25/2019   Indications:AUB Findings:  The uterus is anteverted and measures 8.7 x 4.3 x 6.9 cm. Echo texture is heterogenous with evidence of focal masses. Within the uterus are multiple suspected fibroids measuring: Fibroid 1:SM anterior 1.2 x 1.0 x 1.3 cm Fibroid 2:SS Anterior 2.4 x 2.7 x 2.7 cm  The Endometrium measures 7  mm.  Right Ovary measures 2.2 x 1.6 x 2.2 cm. It is normal in appearance.One  dominant follicle measuring 1.4 cm. Left Ovary measures 2.7 x 1.5 x 1.7 cm. It is normal in appearance. Survey of the adnexa demonstrates no adnexal masses. There is no free fluid in the cul de sac.  Impression: 1. Enlarge fibroid uterus as described above. 2. Rt dominant follicle.  Recommendations: 1.Clinical correlation with the patient's History and Physical Exam.  Jenine M. Albertine Grates    RDMS  I have reviewed this study and agree with documented findings.   Rubie Maid, MD [facility]    Assessment and Plan: Dysfunctional uterine bleeding - Reviewed ultrasound findings with patient. Two small fibroids noted, 1 submucosal. Offered patient options on alternative form of birth control, will continue norethindrone for now. Offered option of use of supplemental estrogen for 2 weeks to help with flow.  Patient ok to try.  Will prescribe. Will f/u on symptoms in 2-3 weeks. May need to consider endometrial biopsy at that visit. Need for pap smear - pap smear needs to be recollected, will perform in 2-3 weeks. Patient notes that she also desires to have STD testing performed at that time.   Follow Up Instructions: Follow up in 2-3 weeks.   I discussed the assessment and treatment plan with the patient. The patient was provided an opportunity to ask questions and all were answered. The patient agreed with the plan and demonstrated an understanding of the instructions.   The patient was advised to call back or seek an in-person evaluation if the symptoms worsen or if  the condition fails to improve as anticipated.  I provided 11 minutes of non-face-to-face time during this encounter.   Rubie Maid, MD  [facility]

## 2019-06-02 NOTE — Progress Notes (Signed)
Pt having televisit to discuss u/s results. Medication and history updated. Pt stated that she was doing well.

## 2019-06-06 ENCOUNTER — Telehealth: Payer: Self-pay | Admitting: Obstetrics and Gynecology

## 2019-06-06 NOTE — Telephone Encounter (Signed)
Patient called this morning needing to clarify her medication. Stated she was recently called in Estrace and is to follow up with Dr. Marcelline Mates however she does not recall if she is to stop taking any of her other medications. Can you please give her call back and clarify. Thanks.

## 2019-06-07 NOTE — Telephone Encounter (Signed)
Please advise 

## 2019-06-14 ENCOUNTER — Telehealth: Payer: Self-pay

## 2019-06-14 NOTE — Telephone Encounter (Signed)
Pt called in and stated that she left a message with the nurse on the 22nd and she hasn't been called . The pt was put on estrace. The pt has questions about the meds. The pt is requesting a call back. Please advise.

## 2019-06-14 NOTE — Telephone Encounter (Signed)
Call transferred from front desk-  Pt states she has called several times and left my chart messages over the last few weeks. She has not gotten a clear response on how to take her medication. Currently she is taking only estradiol 1 mg qd.  Pt needs clarification on how to take her medication.  Informed pt that I would need to speak with [facility] to confirm what med and correct dosage.   Per [facility]- Pt is to take norethindrone qd. Estradiol days 1-14. Pt advised to start today with both medications. [facility] [place] [facility] she picked up norethindrone. Per Caryl Pina at pharmacy on 2/12 pt picked up norethindrone. Pt verbalizing understanding starting today she will take norethindrone qd and Estradiol 1mg  qd until she sees [facility] on 3/9.  Pt is requesting emb bx at next appt. Added to appt notes. Informed pt [facility] will decide.

## 2019-06-21 ENCOUNTER — Encounter: Payer: Self-pay | Admitting: Obstetrics and Gynecology

## 2019-06-21 ENCOUNTER — Other Ambulatory Visit: Payer: Self-pay

## 2019-06-21 ENCOUNTER — Other Ambulatory Visit (HOSPITAL_COMMUNITY)
Admission: RE | Admit: 2019-06-21 | Discharge: 2019-06-21 | Disposition: A | Discharge status: Home / Self Care | Payer: Medicaid Other | Source: Ambulatory Visit | Attending: Obstetrics and Gynecology | Admitting: Obstetrics and Gynecology

## 2019-06-21 ENCOUNTER — Ambulatory Visit (INDEPENDENT_AMBULATORY_CARE_PROVIDER_SITE_OTHER): Payer: Medicaid Other | Admitting: Obstetrics and Gynecology

## 2019-06-21 VITALS — BP 110/73 | HR 67 | Ht 64.0 in | Wt 114.0 lb

## 2019-06-21 DIAGNOSIS — Z124 Encounter for screening for malignant neoplasm of cervix: Secondary | ICD-10-CM | POA: Diagnosis not present

## 2019-06-21 DIAGNOSIS — Z1231 Encounter for screening mammogram for malignant neoplasm of breast: Secondary | ICD-10-CM

## 2019-06-21 DIAGNOSIS — Z113 Encounter for screening for infections with a predominantly sexual mode of transmission: Secondary | ICD-10-CM

## 2019-06-21 DIAGNOSIS — R399 Unspecified symptoms and signs involving the genitourinary system: Secondary | ICD-10-CM

## 2019-06-21 LAB — POCT URINALYSIS DIPSTICK
Bilirubin, UA: NEGATIVE
Glucose, UA: NEGATIVE
Ketones, UA: NEGATIVE
Nitrite, UA: NEGATIVE
Protein, UA: POSITIVE — AB
Spec Grav, UA: 1.015 (ref 1.010–1.025)
Urobilinogen, UA: 0.2 E.U./dL
pH, UA: 7.5 (ref 5.0–8.0)

## 2019-06-21 NOTE — Progress Notes (Signed)
    GYNECOLOGY PROGRESS NOTE  Subjective:    Patient ID: Melissa Pruitt, female    DOB: 1975-07-13, 44 y.o.   MRN: [id number]  HPI  Patient is a 44 y.o. [place] female who presents for repeat pap smear, previous specimen lost (performed in January with her annual exam). Also desires to have STD screening (states that her suburethral cyst causes issues with condom slippage).  Lastly patient desires urine to be checked for a UTI.  Patient performs self-catheterizations due to neurogenic bladder and has begun noted  Noticing an odor with her urine. She has also been dealing with bladder infections since October 2020.    On another note, Laretta does report that her vaginal bleeding has improved.  She was initiated on a round of estradiol for 2 weeks in supplementation with her norethindrone. Notes bleeding has essentially subsided.   The following portions of the patient's history were reviewed and updated as appropriate: allergies, current medications, past family history, past medical history, past social history, past surgical history and problem list.  Review of Systems Pertinent items noted in HPI and remainder of comprehensive ROS otherwise negative.   Objective:   Blood pressure 110/73, pulse 67, height 5\' 4"  (1.626 m), weight 114 lb (51.7 kg). General appearance: alert and no distress Abdomen: soft, non-tender; bowel sounds normal; no masses,  no organomegaly Pelvic: external genitalia normal, rectovaginal septum normal.  Vagina without discharge.  Cervix normal appearing, no lesions and no motion tenderness.  Uterus mobile, nontender, normal shape and size.  Adnexae non-palpable, nontender bilaterally.   Labs:  Results for orders placed or performed in visit on 06/21/19  POCT urinalysis dipstick  Result Value Ref Range   Color, UA yellow    Clarity, UA clear    Glucose, UA Negative Negative   Bilirubin, UA neg    Ketones, UA neg    Spec Grav, UA 1.015 1.010 - 1.025   Blood, UA  small    pH, UA 7.5 5.0 - 8.0   Protein, UA Positive (A) Negative   Urobilinogen, UA 0.2 0.2 or 1.0 E.U./dL   Nitrite, UA neg    Leukocytes, UA Large (3+) (A) Negative   Appearance yellow;clear    Odor       Assessment:   1. Cervical cancer screening   2. Screening examination for STD (sexually transmitted disease)   3. UTI symptoms   4. Breast cancer screening by mammogram     Plan:   1. Pap smear repeated. Will notify patient of results by phone.  2. STD screening ordered per patient request, including HIV screening. Will notify of results by phone.  3. UTI symptoms, patient has been dealing with UTI issues since October 2020.  Was seen by a Urologist at [facility], however notes that they did not check her urine despite her complaints. Will order urine culture today based on UA.  4. Breast screening, patient desires for her mammograms to be done at external facility. Printed requisition form for patient.  5. Return to clinic for any scheduled appointments or for any gynecologic concerns as needed.    Rubie Maid, MD [facility]

## 2019-06-21 NOTE — Progress Notes (Signed)
Pt present for repeat pap smear and std screeningyell. Pt requested urine cultures done.  UA completed

## 2019-06-22 LAB — RPR: RPR Ser Ql: NONREACTIVE

## 2019-06-22 LAB — HIV ANTIBODY (ROUTINE TESTING W REFLEX): HIV Screen 4th Generation wRfx: NONREACTIVE

## 2019-06-24 ENCOUNTER — Telehealth: Payer: Self-pay | Admitting: Obstetrics and Gynecology

## 2019-06-24 LAB — NUSWAB VAGINITIS PLUS (VG+)
Candida albicans, NAA: NEGATIVE
Candida glabrata, NAA: NEGATIVE
Chlamydia trachomatis, NAA: NEGATIVE
Neisseria gonorrhoeae, NAA: NEGATIVE
Trich vag by NAA: NEGATIVE

## 2019-06-24 LAB — URINE CULTURE

## 2019-06-24 MED ORDER — AMOXICILLIN-POT CLAVULANATE 875-125 MG PO TABS: 1.0000 | ORAL_TABLET | Freq: Two times a day (BID) | ORAL | 0 refills | Status: AC

## 2019-06-24 NOTE — Telephone Encounter (Signed)
Pt was called to go over the results given by [facility] after sending her test results. Pt stated that I could have told her that when we were on the phone the first time. I informed pt that I was trying to but she kept interrupting me. When I was trying to talk pt yelled at me and stated will you please let me talk and hung up on me. Pt stated that she would see her primary doctor for treatment.

## 2019-06-24 NOTE — Telephone Encounter (Signed)
Please see result notes message.

## 2019-06-24 NOTE — Telephone Encounter (Signed)
Pt called in and stated that is [facility] [place]. The pt stated that she is not trying to sit all weekend with e coli in her bladder all weekend. The pt is requesting a call back. Please advise.

## 2019-06-24 NOTE — Telephone Encounter (Signed)
Patient called saying she was following up on the results of her urine culture. I told patient I would leave a telephone message. Marked as high priority because patient was irate because she stated she was in pain and needed to know what she needed to do before the weekend.

## 2019-06-27 LAB — CYTOLOGY - PAP
Comment: NEGATIVE
Diagnosis: NEGATIVE
Diagnosis: REACTIVE
High risk HPV: NEGATIVE

## 2019-07-08 ENCOUNTER — Telehealth: Payer: Self-pay | Admitting: Obstetrics & Gynecology

## 2019-07-08 NOTE — Telephone Encounter (Signed)
I was able to speak to Melissa Pruitt about making an appointment. The address was given, along with a date and time of her appointment.

## 2019-07-25 ENCOUNTER — Other Ambulatory Visit: Payer: Self-pay

## 2019-07-25 ENCOUNTER — Encounter: Payer: Medicaid Other | Admitting: Obstetrics and Gynecology

## 2019-07-25 ENCOUNTER — Encounter: Payer: Self-pay | Admitting: *Deleted

## 2019-08-25 ENCOUNTER — Encounter: Payer: Medicaid Other | Admitting: Family Medicine

## 2019-08-29 ENCOUNTER — Ambulatory Visit (INDEPENDENT_AMBULATORY_CARE_PROVIDER_SITE_OTHER): Payer: Medicaid Other | Admitting: Women's Health

## 2019-08-29 ENCOUNTER — Encounter: Payer: Self-pay | Admitting: Women's Health

## 2019-08-29 ENCOUNTER — Other Ambulatory Visit: Payer: Self-pay

## 2019-08-29 NOTE — Progress Notes (Signed)
Patient incorrectly scheduled, rescheduled with female MD.  Melissa Fling, NP  2:20 PM 08/29/2019

## 2019-09-06 ENCOUNTER — Other Ambulatory Visit: Payer: Self-pay

## 2019-09-06 ENCOUNTER — Ambulatory Visit: Payer: Medicaid Other

## 2019-09-06 ENCOUNTER — Telehealth: Payer: Self-pay | Admitting: Lactation Services

## 2019-09-06 DIAGNOSIS — Z0189 Encounter for other specified special examinations: Secondary | ICD-10-CM

## 2019-09-06 NOTE — Telephone Encounter (Signed)
Called patient to offer for her to come by the office today or tomorrow to give a urine sample so it can be sent for culture to be returned by the time she comes into the office on 5/28.   Apologized for loss of her urine and the inconvenience.   Patient plans to come in this afternoon to give urine sample.   Message to front desk to add patient to the lab schedule.

## 2019-09-09 ENCOUNTER — Other Ambulatory Visit: Payer: Self-pay

## 2019-09-09 ENCOUNTER — Encounter: Payer: Self-pay | Admitting: Family Medicine

## 2019-09-09 ENCOUNTER — Ambulatory Visit (INDEPENDENT_AMBULATORY_CARE_PROVIDER_SITE_OTHER): Payer: Medicaid Other | Admitting: Family Medicine

## 2019-09-09 VITALS — BP 103/78 | HR 70 | Wt 115.0 lb

## 2019-09-09 DIAGNOSIS — N939 Abnormal uterine and vaginal bleeding, unspecified: Secondary | ICD-10-CM | POA: Diagnosis not present

## 2019-09-09 DIAGNOSIS — N390 Urinary tract infection, site not specified: Secondary | ICD-10-CM

## 2019-09-09 DIAGNOSIS — N319 Neuromuscular dysfunction of bladder, unspecified: Secondary | ICD-10-CM

## 2019-09-09 DIAGNOSIS — Z7689 Persons encountering health services in other specified circumstances: Secondary | ICD-10-CM

## 2019-09-09 LAB — URINE CULTURE

## 2019-09-09 MED ORDER — CEFDINIR 300 MG PO CAPS: 300.0000 mg | ORAL_CAPSULE | Freq: Two times a day (BID) | ORAL | 0 refills | Status: AC

## 2019-09-09 MED ORDER — CEPHALEXIN 500 MG PO CAPS: 500.0000 mg | ORAL_CAPSULE | Freq: Every day | ORAL | 0 refills | Status: AC

## 2019-09-09 NOTE — Progress Notes (Signed)
GYNECOLOGY OFFICE NOTE  History:  44 y.o. G3P3 here today to transfer care from [facility].  Recurrent UTIs from neurogenic bleeding Her major concern is recurrent urinary tract infections. 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 She has had multiple UTIs with E. Coli, which has been sensitive to multiple antibiotics, however she does have multiple allergies to medications so they have been difficult to treat.  Abnormal uterine bleeding Since her last baby (2009), she has been going through perimenopause. She is on micronor, with estradiol for break through bleeding. She normally doesn't get a period, and only has break through bleeding when she takes antibiotics for her recurrent UTIs.  She has hot flashes, but these don't bother her significantly. She also has a known fibroid uterus, and recent pelvic US showed endometrium thickness of 7 mm. She declines IUD for birth control, but would be interested in considering ablation as she has had a BTL.   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  She has had cervical cancer, treat with a LEEP, with a history of one abnormal Pap smear after- the rest have been normal since then. Most recent Pap smear was done a couple of months ago.  Past Medical History:  Diagnosis Date  . Abnormal Pap smear of cervix   . Cervical cancer [facility])    removed in July 2009  . History of recurrent UTIs   . HPV (human papilloma virus) infection   . HSV (herpes simplex virus) infection   . 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 Normocytic anemia 08/21/2014  . 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 Vitamin B 12 deficiency 08/21/2014    Past Surgical History:  Procedure Laterality Date  . BACK SURGERY    . BLADDER SURGERY    . COLOSTOMY     after flex sig as 44 year old.   .  colostomy reversal    . EXPLORATORY LAPAROTOMY     with colostomy placement  . LEEP    . removal of cervical cancer    . TUBAL LIGATION       Current Outpatient Medications:  .  Cyanocobalamin (VITAMIN B 12 PO), Take by mouth., Disp: , Rfl:  .  estradiol (ESTRACE) 1 MG tablet, Take 1 tablet (1 mg total) by mouth daily. Take for 2 weeks each month for bleeding, Disp: 14 tablet, Rfl: 2 .  fluticasone (FLONASE) 50 MCG/ACT nasal spray, Place 2 sprays into the nose daily., Disp: , Rfl:  .  lidocaine (LIDODERM) 5 %, Place 1 patch onto the skin daily. Remove & Discard patch within 12 hours or as directed by MD , Disp: , Rfl:  .  loratadine (CLARITIN) 10 MG tablet, Take 10 mg by mouth daily.  , Disp: , Rfl:  .  methocarbamol (ROBAXIN) 750 MG tablet, Take 750 mg by mouth 3 (three) times daily., Disp: , Rfl:  .  montelukast (SINGULAIR) 10 MG tablet, Take 10 mg by mouth at bedtime., Disp: , Rfl:  .  norethindrone (MICRONOR) 0.35 MG tablet, Take 1 tablet (0.35 mg total) by mouth daily., Disp: 84 Package, Rfl: 3 .  cefdinir (OMNICEF) 300 MG capsule, Take 1 capsule (300 mg total) by mouth 2 (two) times daily for 14 days., Disp: 28 capsule, Rfl: 0 .  cephALEXin (KEFLEX)  500 MG capsule, Take 1 capsule (500 mg total) by mouth daily. For UTI suppressive therapy, Disp: 30 capsule, Rfl: 0 .  EPINEPHrine (EPIPEN 2-PAK) 0.3 mg/0.3 mL IJ SOAJ injection, Inject into the muscle once., Disp: , Rfl:   The following portions of the patient's history were reviewed and updated as appropriate: allergies, current medications, past family history, past medical history, past social history, past surgical history and problem list.   Review of Systems:  Pertinent items noted in HPI and remainder of comprehensive ROS otherwise negative.   Objective:  Physical Exam BP 103/78   Pulse 70   Wt 115 lb (52.2 kg)   LMP  (LMP Unknown)   BMI 19.74 kg/m  CONSTITUTIONAL: Well-developed, well-nourished female in no acute  distress.  HENT:  Normocephalic, atraumatic. External right and left ear normal. Oropharynx is clear and moist EYES: Conjunctivae and EOM are normal. Pupils are equal, round, and reactive to light. No scleral icterus.  NECK: Normal range of motion, supple, no masses SKIN: Skin is warm and dry. No rash noted. Not diaphoretic. No erythema. No pallor. NEUROLOGIC: Alert and oriented to person, place, and time. Normal reflexes, muscle tone coordination. No cranial nerve deficit noted. PSYCHIATRIC: Normal mood and affect. Normal behavior. Normal judgment and thought content. CARDIOVASCULAR: Normal heart rate noted RESPIRATORY: Effort and breath sounds normal, no problems with respiration noted ABDOMEN: Soft, no distention noted.   PELVIC: Deferred per patient request MUSCULOSKELETAL: Normal range of motion. No edema noted.  Labs and Imaging  Patient Name: Melissa Pruitt DOB: 1975/12/21 MRN: [id number] ULTRASOUND REPORT  Location: [facility]  Date of Service: 05/25/2019   Indications:AUB Findings:  The uterus is anteverted and measures 8.7 x 4.3 x 6.9 cm. Echo texture is heterogenous with evidence of focal masses. Within the uterus are multiple suspected fibroids measuring: Fibroid 1:SM anterior 1.2 x 1.0 x 1.3 cm Fibroid 2:SS Anterior 2.4 x 2.7 x 2.7 cm  The Endometrium measures 7 mm.  Right Ovary measures 2.2 x 1.6 x 2.2 cm. It is normal in appearance.One dominant follicle measuring 1.4 cm. Left Ovary measures 2.7 x 1.5 x 1.7 cm. It is normal in appearance. Survey of the adnexa demonstrates no adnexal masses. There is no free fluid in the cul de sac.  Impression: 1. Enlarge fibroid uterus as described above. 2. Rt dominant follicle.  Recommendations: 1.Clinical correlation with the patient's History and Physical Exam.  Assessment & Plan:  1. Encounter to establish care with new doctor - Transferring from [facility] - Oriented to practice - Last Pap smear in  March 2021- normal, HPV negative  2. Recurrent UTI - recommend suppressive therapy with Keflex. If antibiotic suppression does not work, may consider methenamine for urinary acidification and UTI suppression - cefdinir (OMNICEF) 300 MG capsule; Take 1 capsule (300 mg total) by mouth 2 (two) times daily for 14 days.  Dispense: 28 capsule; Refill: 0 - cephALEXin (KEFLEX) 500 MG capsule; Take 1 capsule (500 mg total) by mouth daily. For UTI suppressive therapy  Dispense: 30 capsule; Refill: 0  3. Neurogenic bladder disorder - see above  4. Abnormal uterine bleeding (AUB) - considering ablation, as she has a BTL for birth control. - normally doesn't get a period due to being on Micronor, only has vaginal bleeding when she is on antibiotics (which I reassured her was normal because some antibiotics can decrease the effectiveness of birth control) - has estradiol to use for breakthrough bleeding when she uses antibiotics  Routine preventative  health maintenance measures emphasized. Please refer to After Visit Summary for other counseling recommendations.   Return in about 2 months (around 11/09/2019) for MD attending only, f/u UTI, ablation consult, skene's gland.  Total face-to-face time with patient: 46 minutes. Over 50% of encounter was spent on counseling and coordination of care.  Merilyn Baba, DO OB Fellow, Faculty Practice 09/09/2019 3:01 PM

## 2019-11-02 ENCOUNTER — Ambulatory Visit: Payer: Medicaid Other | Admitting: Podiatry

## 2019-11-02 ENCOUNTER — Other Ambulatory Visit: Payer: Self-pay

## 2019-11-02 DIAGNOSIS — L989 Disorder of the skin and subcutaneous tissue, unspecified: Secondary | ICD-10-CM

## 2019-11-02 DIAGNOSIS — L819 Disorder of pigmentation, unspecified: Secondary | ICD-10-CM

## 2019-11-02 NOTE — Progress Notes (Signed)
   HPI: 44 y.o. female presenting today PMHx spina bifida with a tethered spinal cord presents today with hyperpigmented lesions to the plantar aspect of the foot that have developed over the last 2 years.  They have slowly increased in size and the border is irregular.  Patient is concerned because she does have history of malignant dermal lesions in the past that have been removed.  She presents today for further treatment and evaluation  Past Medical History:  Diagnosis Date  . Abnormal Pap smear of cervix   . Cervical cancer [facility])    removed in July 2009  . History of recurrent UTIs   . HPV (human papilloma virus) infection   . HSV (herpes simplex virus) infection   . 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 Normocytic anemia 08/21/2014  . 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 Vitamin B 12 deficiency 08/21/2014     Physical Exam: General: The patient is alert and oriented x3 in no acute distress.  Dermatology: Skin is warm, dry and supple bilateral lower extremities. Negative for open lesions or macerations.  Multiple hyperpigmented lesions noted to the right plantar midfoot.  The largest lesion is approximately 0.5 cm in diameter with irregularity of the borders.  Concerning for possible melanoma.  Vascular: Palpable pedal pulses bilaterally. No edema or erythema noted. Capillary refill within normal limits.  Neurological: Epicritic and protective threshold grossly intact bilaterally.   Musculoskeletal Exam: Range of motion within normal limits to all pedal and ankle joints bilateral. Muscle strength 5/5 in all groups bilateral.   Assessment: 1.  Multiple hyperpigmented skin lesions right foot   Plan of Care:  1. Patient evaluated 2.  Patient has a history of dermatology treatment.  She had would be beneficial for her to have a full skin evaluation throughout her whole body.  Today I am going to refer the patient to [facility]  for better evaluation and treatment.  I explained to the patient that the dermatologist would be better suited for biopsy and evaluation of the skin lesions. 3.  Return to clinic as needed      Edrick Kins, DPM [facility]  Dr. Edrick Kins, DPM    [address], [place] [address]                Office [phone]  Fax [phone]

## 2019-11-03 ENCOUNTER — Telehealth: Payer: Self-pay | Admitting: *Deleted

## 2019-11-03 NOTE — Telephone Encounter (Signed)
I spoke with pt and informed that [facility] stated pt should contact her insurance to find a [facility] dermatologist that would except her Medicaid. Pt states she does not have that luxury to wait 3 hours on the phone while they check for offices that accept her insurance. Pt asked if there was a [facility] and I informed that [facility]. Pt states send the referral to Evern Bio, NP, she would not go to [facility]. Faxed letter with pt's request and Dr. Amalia Hailey referral request and clinical to Evern Bio, NP.

## 2019-11-03 NOTE — Telephone Encounter (Signed)
[facility] number staff states pt should contact her insurance to see if her Medicaid is accepted.

## 2019-11-03 NOTE — Telephone Encounter (Signed)
-----   Message from Edrick Kins, DPM sent at 11/02/2019  2:59 PM EDT ----- Regarding: Referral to [facility] Please refer to [facility].  Diagnosis: Hyperpigmented skin lesions right plantar midfoot  Thanks, Dr. Amalia Hailey

## 2019-11-08 DIAGNOSIS — M25512 Pain in left shoulder: Secondary | ICD-10-CM | POA: Insufficient documentation

## 2019-11-22 ENCOUNTER — Other Ambulatory Visit: Payer: Self-pay

## 2019-11-22 ENCOUNTER — Encounter: Payer: Self-pay | Admitting: Obstetrics and Gynecology

## 2019-11-22 ENCOUNTER — Ambulatory Visit (INDEPENDENT_AMBULATORY_CARE_PROVIDER_SITE_OTHER): Payer: Medicaid Other | Admitting: Obstetrics and Gynecology

## 2019-11-22 VITALS — BP 99/71 | HR 65 | Ht 64.0 in | Wt 119.4 lb

## 2019-11-22 DIAGNOSIS — N939 Abnormal uterine and vaginal bleeding, unspecified: Secondary | ICD-10-CM | POA: Diagnosis not present

## 2019-11-22 DIAGNOSIS — N368 Other specified disorders of urethra: Secondary | ICD-10-CM | POA: Diagnosis not present

## 2019-11-22 NOTE — Progress Notes (Signed)
Notes from last visit on 09/09/2019:  4. Abnormal uterine bleeding (AUB) - considering ablation, as she has a BTL for birth control. - normally doesn't get a period due to being on Micronor, only has vaginal bleeding when she is on antibiotics (which I reassured her was normal because some antibiotics can decrease the effectiveness of birth control) - has estradiol to use for breakthrough bleeding when she uses antibiotics  Routine preventative health maintenance measures emphasized. Please refer to After Visit Summary for other counseling recommendations.   Return in about 2 months (around 11/09/2019) for MD attending only, f/u UTI, ablation consult, skene's gland.  Total face-to-face time with patient: 46 minutes. Over 50% of encounter was spent on counseling and coordination of care.  Merilyn Baba, DO OB Fellow, Faculty Practice 09/09/2019 3:01 PM

## 2019-11-22 NOTE — Progress Notes (Signed)
GYNECOLOGY OFFICE FOLLOW UP NOTE  History:  44 y.o. G3P3 here today for follow up for counseling regarding breakthough bleeding. Patient has history of LEEP after birth of child in 2010. Reports she has had perimenopausal symptoms since that time and has irregular, unpredictable bleeding. Started on micronor by prior OB/GYN and it improved significantly. Now, only seems to have breakthrough bleeding when she is on antibiotics (frequent due to need for self-catheterization). Wondering if the two are related or if there is another issue she needs ot be concerned about.   Also has an enlarged skene's gland that is uncomfortable during intercourse, wondering if that can be safely removed/drained.  Past Medical History:  Diagnosis Date  . Abnormal Pap smear of cervix   . Cervical cancer [facility])    removed in July 2009  . History of recurrent UTIs   . HPV (human papilloma virus) infection   . HSV (herpes simplex virus) infection   . 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 Normocytic anemia 08/21/2014  . 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 Vitamin B 12 deficiency 08/21/2014    Past Surgical History:  Procedure Laterality Date  . BACK SURGERY    . BLADDER SURGERY    . COLOSTOMY     after flex sig as 44 year old.   . colostomy reversal    . EXPLORATORY LAPAROTOMY     with colostomy placement  . LEEP    . removal of cervical cancer    . TUBAL LIGATION       Current Outpatient Medications:  .  cephALEXin (KEFLEX) 500 MG capsule, Take 500 mg by mouth 4 (four) times daily., Disp: , Rfl:  .  Cyanocobalamin (VITAMIN B 12 PO), Take by mouth., Disp: , Rfl:  .  EPINEPHrine (EPIPEN 2-PAK) 0.3 mg/0.3 mL IJ SOAJ injection, Inject into the muscle once., Disp: , Rfl:  .  estradiol (ESTRACE) 1 MG tablet, Take 1 tablet (1 mg total) by mouth daily. Take for 2 weeks each month for bleeding, Disp: 14 tablet, Rfl: 2 .  fluticasone (FLONASE) 50 MCG/ACT nasal spray, Place 2  sprays into the nose daily., Disp: , Rfl:  .  lidocaine (LIDODERM) 5 %, Place 1 patch onto the skin daily. Remove & Discard patch within 12 hours or as directed by MD , Disp: , Rfl:  .  loratadine (CLARITIN) 10 MG tablet, Take 10 mg by mouth daily.  , Disp: , Rfl:  .  methocarbamol (ROBAXIN) 750 MG tablet, Take 750 mg by mouth 3 (three) times daily., Disp: , Rfl:  .  montelukast (SINGULAIR) 10 MG tablet, Take 10 mg by mouth at bedtime., Disp: , Rfl:  .  norethindrone (MICRONOR) 0.35 MG tablet, Take 1 tablet (0.35 mg total) by mouth daily., Disp: 68 Package, Rfl: 3  The following portions of the patient's history were reviewed and updated as appropriate: allergies, current medications, past family history, past medical history, past social history, past surgical history and problem list.   Review of Systems:  Pertinent items noted in HPI and remainder of comprehensive ROS otherwise negative.   Objective:  Physical Exam BP 99/71   Pulse 65   Ht 5\' 4"  (1.626 m)   Wt 119 lb 6.4 oz (54.2 kg)   BMI 20.49 kg/m  CONSTITUTIONAL: Well-developed, well-nourished female in no acute distress.  HENT:  Normocephalic, atraumatic. External right and left ear normal. Oropharynx is clear and moist EYES: Conjunctivae and EOM are  normal. Pupils are equal, round, and reactive to light. No scleral icterus.  NECK: Normal range of motion, supple, no masses SKIN: Skin is warm and dry. No rash noted. Not diaphoretic. No erythema. No pallor. NEUROLOGIC: Alert and oriented to person, place, and time. Normal reflexes, muscle tone coordination. No cranial nerve deficit noted. PSYCHIATRIC: Normal mood and affect. Normal behavior. Normal judgment and thought content. CARDIOVASCULAR: Normal heart rate noted RESPIRATORY: Effort normal, no problems with respiration noted ABDOMEN: Soft, no distention noted.   PELVIC: deferred MUSCULOSKELETAL: Normal range of motion. No edema noted.  Labs and Imaging Patient Name: SHAKEERA RIGHTMYER DOB: 02-03-1976 MRN: [id number] ULTRASOUND REPORT  Location: [facility]  Date of Service: 05/25/2019   Indications:AUB Findings:  The uterus is anteverted and measures 8.7 x 4.3 x 6.9 cm. Echo texture is heterogenous with evidence of focal masses. Within the uterus are multiple suspected fibroids measuring: Fibroid 1:SM anterior 1.2 x 1.0 x 1.3 cm Fibroid 2:SS Anterior 2.4 x 2.7 x 2.7 cm  The Endometrium measures 7 mm.  Right Ovary measures 2.2 x 1.6 x 2.2 cm. It is normal in appearance.One dominant follicle measuring 1.4 cm. Left Ovary measures 2.7 x 1.5 x 1.7 cm. It is normal in appearance. Survey of the adnexa demonstrates no adnexal masses. There is no free fluid in the cul de sac.  Impression: 1. Enlarge fibroid uterus as described above. 2. Rt dominant follicle.  Recommendations: 1.Clinical correlation with the patient's History and Physical Exam.   Jenine M. Albertine Grates    RDMS I have reviewed this study and agree with documented findings.  Rubie Maid, MD [facility]  Assessment & Plan:   1. Abnormal uterine bleeding (AUB) - Patient happy to continue with micronor as it is controlling her bleeding - reviewed likelihood that antibiotics are causing increased uptake of mircronor, causing some breakthrough bleeding - may continue micronor if she is happy with results on it, would not be concerned about breakthrough bleeding at this point - if no improvement, or otherwise uncontrolled, would recommend EMB   2. 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 Please refer to After Visit Summary for other counseling recommendations.   Return in about 4 weeks (around 12/20/2019).  Total face-to-face time with patient:  22 minutes. Over 50% of encounter was spent on counseling and coordination of care.  Feliz Beam, M.D. Attending Center for [employer])

## 2019-12-23 ENCOUNTER — Encounter: Payer: Self-pay | Admitting: Obstetrics and Gynecology

## 2019-12-23 ENCOUNTER — Other Ambulatory Visit: Payer: Self-pay

## 2019-12-23 ENCOUNTER — Ambulatory Visit (INDEPENDENT_AMBULATORY_CARE_PROVIDER_SITE_OTHER): Payer: Medicaid Other | Admitting: Obstetrics and Gynecology

## 2019-12-23 VITALS — BP 101/65 | HR 71 | Ht 64.0 in | Wt 120.3 lb

## 2019-12-23 DIAGNOSIS — N368 Other specified disorders of urethra: Secondary | ICD-10-CM

## 2019-12-23 NOTE — Progress Notes (Signed)
GYNECOLOGY OFFICE FOLLOW UP NOTE  History:  44 y.o. G3P3 here today for follow up for exam of enlarged skene's gland. Does not bother her except when she is having intercourse, causes the condom to come off. Otherwise no pain. She is interested in having it excised but as she self-catheterizes multiple times a day due to neurogenic bladder, she is concerned about the proximity to her urethra and that possible excision may damage her urethra. 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  Otherwise, doing well and up to date with routine gyn.    Past Medical History:  Diagnosis Date  . Abnormal Pap smear of cervix   . Cervical cancer [facility])    removed in July 2009  . History of recurrent UTIs   . HPV (human papilloma virus) infection   . HSV (herpes simplex virus) infection   . 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 Normocytic anemia 08/21/2014  . 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 Vitamin B 12 deficiency 08/21/2014    Past Surgical History:  Procedure Laterality Date  . BACK SURGERY    . BLADDER SURGERY    . COLOSTOMY     after flex sig as 44 year old.   . colostomy reversal    . EXPLORATORY LAPAROTOMY     with colostomy placement  . LEEP    . removal of cervical cancer    . TUBAL LIGATION       Current Outpatient Medications:  .  cephALEXin (KEFLEX) 500 MG capsule, Take 500 mg by mouth 4 (four) times daily., Disp: , Rfl:  .  Cyanocobalamin (VITAMIN B 12 PO), Take by mouth., Disp: , Rfl:  .  EPINEPHrine (EPIPEN 2-PAK) 0.3 mg/0.3 mL IJ SOAJ injection, Inject into the muscle once., Disp: , Rfl:  .  estradiol (ESTRACE) 1 MG tablet, Take 1 tablet (1 mg total) by mouth daily. Take for 2 weeks each month for bleeding, Disp: 14 tablet, Rfl: 2 .  fluticasone (FLONASE) 50 MCG/ACT nasal spray, Place 2 sprays into the nose daily., Disp: , Rfl:  .  lidocaine (LIDODERM) 5 %, Place 1 patch onto the skin daily. Remove & Discard patch within 12  hours or as directed by MD , Disp: , Rfl:  .  loratadine (CLARITIN) 10 MG tablet, Take 10 mg by mouth daily.  , Disp: , Rfl:  .  methocarbamol (ROBAXIN) 750 MG tablet, Take 750 mg by mouth 3 (three) times daily., Disp: , Rfl:  .  montelukast (SINGULAIR) 10 MG tablet, Take 10 mg by mouth at bedtime., Disp: , Rfl:  .  norethindrone (MICRONOR) 0.35 MG tablet, Take 1 tablet (0.35 mg total) by mouth daily., Disp: 63 Package, Rfl: 3  The following portions of the patient's history were reviewed and updated as appropriate: allergies, current medications, past family history, past medical history, past social history, past surgical history and problem list.   Review of Systems:  Pertinent items noted in HPI and remainder of comprehensive ROS otherwise negative.   Objective:  Physical Exam BP 101/65   Pulse 71   Ht 5\' 4"  (1.626 m)   Wt 120 lb 4.8 oz (54.6 kg)   BMI 20.65 kg/m  CONSTITUTIONAL: Well-developed, well-nourished female in no acute distress.  HENT:  Normocephalic, atraumatic. External right and left ear normal. Oropharynx is clear and moist EYES: Conjunctivae and EOM are normal. Pupils are equal, round, and reactive to light. No scleral icterus.  NECK: Normal  range of motion, supple, no masses SKIN: Skin is warm and dry. No rash noted. Not diaphoretic. No erythema. No pallor. NEUROLOGIC: Alert and oriented to person, place, and time. Normal reflexes, muscle tone coordination. No cranial nerve deficit noted. PSYCHIATRIC: Normal mood and affect. Normal behavior. Normal judgment and thought content. CARDIOVASCULAR: Normal heart rate noted RESPIRATORY: Effort normal, no problems with respiration noted ABDOMEN: Soft, no distention noted.   PELVIC: Normal appearing external genitalia; enlarged 2 cm mobile non-tender mass at distal anterior vagina MUSCULOSKELETAL: Normal range of motion. No edema noted.  Exam done with chaperone present.  Labs and Imaging No results found.  Assessment  & Plan:   1. Skene's gland cyst Given proximity to urethra, neurogenic bladder with routine self-catheterization, recommend eval by urogyn for removal, pt is agreeable to this plan - referral to Dr. Ruby Cola   Routine preventative health maintenance measures emphasized. Please refer to After Visit Summary for other counseling recommendations.   Return in about 1 year (around 12/22/2020) for annual.  Total face-to-face time with patient: 15 minutes. Over 50% of encounter was spent on counseling and coordination of care.  Feliz Beam, M.D. Attending Center for [employer])

## 2020-02-01 ENCOUNTER — Other Ambulatory Visit: Payer: Self-pay

## 2020-02-01 ENCOUNTER — Ambulatory Visit (INDEPENDENT_AMBULATORY_CARE_PROVIDER_SITE_OTHER): Payer: Medicaid Other | Admitting: Obstetrics and Gynecology

## 2020-02-01 ENCOUNTER — Encounter: Payer: Self-pay | Admitting: Obstetrics and Gynecology

## 2020-02-01 VITALS — BP 123/77 | HR 83 | Wt 120.0 lb

## 2020-02-01 DIAGNOSIS — N390 Urinary tract infection, site not specified: Secondary | ICD-10-CM

## 2020-02-01 DIAGNOSIS — N898 Other specified noninflammatory disorders of vagina: Secondary | ICD-10-CM | POA: Diagnosis not present

## 2020-02-01 DIAGNOSIS — N319 Neuromuscular dysfunction of bladder, unspecified: Secondary | ICD-10-CM

## 2020-02-01 LAB — POCT URINALYSIS DIPSTICK
Bilirubin, UA: NEGATIVE
Blood, UA: NEGATIVE
Glucose, UA: NEGATIVE
Ketones, UA: NEGATIVE
Leukocytes, UA: NEGATIVE
Nitrite, UA: NEGATIVE
Protein, UA: POSITIVE — AB
Spec Grav, UA: >=1.03 — AB (ref 1.010–1.025)
Urobilinogen, UA: 0.2 E.U./dL
pH, UA: 6 (ref 5.0–8.0)

## 2020-02-01 LAB — URINE CULTURE: Urine Culture: NO GROWTH

## 2020-02-01 MED ORDER — ESTRADIOL 0.1 MG/GM VA CREA: 0.5000 g | TOPICAL_CREAM | VAGINAL | 11 refills | Status: DC

## 2020-02-01 NOTE — Progress Notes (Signed)
[facility] New Patient Evaluation and Consultation  Referring Provider: Sloan Leiter, MD PCP: Danelle Berry, NP Date of Service: 02/01/2020  SUBJECTIVE Chief Complaint: New Patient (Initial Visit) (Dr Vivien Rota)  History of Present Illness: Melissa Pruitt is a 44 y.o. Other female seen in consultation at the request of Dr. Rosana Hoes for evaluation of skene's gland cyst.    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 Has been present for many years. Had MRI pelvis on 12/15/2017 which showed:  Impression:  Stable simple cyst (2 x 2.5cm) that is interposed between the lower aspects of the  urethra and anterior vagina. Differential include Skene's glandcyst or  Gartner's gland cyst.   Also had an MRI in 2015, which could not differentiate between diverticulum, skenes gland or gartner duct cyst.   Of note, she has history of myelomeningocele and duplicated right collecting system and self-catheterizes for neurogenic bladder. Is followed by [facility] for recurrent UTIs. She has been referred to Infectious Disease due to multiple antibiotic resistances.  - last UDS: 2013 with early sensation, increased capacity, normal compliance, detrusor overactivity, and incomplete emptying - last upper tract imaging: RUS on 08/06/17 with no hydro, trabeculated and decompressed bladder, fibroid uterus, cystic structure near anterior vaginal wall likely Gartner's or Skene duct cyst -Cystoscopy 11/26/2017 by Dr. Alford Highland:  Findings: Anterior urethra: normal without strictures and without scarring. Bladder: Normal mucosa without bladder diverticula, without lesions. Ureteral orifice(s) was/were seen bilateral. Ureteral orifice(s) bilateral were in the normal location and bilateral ureteral orifices were effluxing clear urine. Right duplicated system confirmed with 2 UOs visible. No ureterocele noted. Condition: Bladder wall  bulge indicating bladder wall mass/cyst.    Urinary Symptoms: Does not leak urine.   Self catheterizes- 6x per day Followed by [facility]  UTIs: 5 UTI's in the last year.  Prescribed Fosfomycin by ID. Currently does not feel any symptoms.   Pelvic Organ Prolapse Symptoms:                  She Denies a feeling of a bulge the vaginal area. She Denies seeing a bulge.   Bowel Symptom: Bowel movements: digitally empties each time she catheterizes, does not have sphincter function Stool consistency: alternates Straining: yes.  Splinting: yes.  Incomplete evacuation: yes.  She Admits to accidental bowel leakage / fecal incontinence  Occurs: 1 time(s) per day  Consistency with leakage: hard, solid or loose Bowel regimen: diet Last colonoscopy: Date 2015, Results: poor sphincter function   Sexual Function Sexually active: yes.  Sexual orientation: did not answer Pain with sex: Yes, deep in the pelvis  Pelvic Pain Admits to pelvic pain Location: internal right side and deep inside near tailbone area Pain occurs: constantly Prior pain treatment: pelvic floor PT Improved by: pelvic floor exercises, tens unit Worsened by: sitting and sex  Noticed cyst on vaginal wall several years ago (skene gland cyst?). Not painful, does not have a lot of sensation. Causes condom to slide off during intercourse. Has stopped being sexually active because she is nervous about this.   Past Medical History:  Past Medical History:  Diagnosis Date  . Abnormal Pap smear of cervix   . Cervical cancer [facility])    removed in July 2009  . History of recurrent UTIs   . HPV (human papilloma virus) infection   . HSV (herpes simplex virus) infection   . 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[facility])  sacro/coccygeal mass excision  . Normocytic anemia 08/21/2014  . 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 Vitamin B 12 deficiency 08/21/2014     Past Surgical History:   Past Surgical History:  Procedure  Laterality Date  . BACK SURGERY    . BLADDER SURGERY    . COLOSTOMY     after flex sig as 44 year old.   . colostomy reversal    . EXPLORATORY LAPAROTOMY     with colostomy placement  . LEEP    . removal of cervical cancer    . TUBAL LIGATION       Past OB/GYN History: G3 P3 Vaginal deliveries: 3, Forceps/ Vacuum deliveries: 0, Cesarean section: 0 Menopausal: perimenopausal, irregular periods Contraception: norethindrone and tubal ligation Last pap smear was 06/2019- negative, HPV neg.  Any history of abnormal pap smears: yes.   Medications: She has a current medication list which includes the following prescription(s): cyanocobalamin, epinephrine, estradiol, fluticasone, fosfomycin, loratadine, methocarbamol, montelukast, norethindrone, and [START ON 02/02/2020] estradiol.   Allergies: Patient is allergic to ciprofloxacin, macrobid [nitrofurantoin monohyd macro], morphine and related, azithromycin, dilaudid [hydromorphone hcl], lactose, prednisolone, prednisone, sulfa antibiotics, tramadol hcl, vitamin d analogs, and pork-derived products.   Social History:  Social History   Tobacco Use  . Smoking status: Former Smoker    Types: Cigarettes    Quit date: 07/14/2011    Years since quitting: 8.5  . Smokeless tobacco: Never Used  . Tobacco comment: 1-3 cigarettes  Vaping Use  . Vaping Use: Never used  Substance Use Topics  . Alcohol use: Yes    Comment: twice a year  . Drug use: No    Relationship status: divorced She lives with 2 daughters (age 49 and 10).   She is not employed. Regular exercise: Yes: physical therapy History of abuse: No  Family History:   Family History  Problem Relation Age of Onset  . Hypertension Mother   . Atrial fibrillation Mother   . Cervical cancer Maternal Grandmother   . Colon cancer Neg Hx   . Breast cancer Neg Hx      Review of Systems: Review of Systems  Constitutional: Positive for malaise/fatigue. Negative for fever and weight  loss.  Respiratory: Negative for cough, shortness of breath and wheezing.   Cardiovascular: Positive for palpitations. Negative for chest pain.  Gastrointestinal: Negative for abdominal pain and blood in stool.  Musculoskeletal: Negative for myalgias.       Muscle weakeness  Skin: Negative for rash.  Neurological: Negative for dizziness and headaches.  Endo/Heme/Allergies: Does not bruise/bleed easily.  Psychiatric/Behavioral: Negative for depression. The patient is not nervous/anxious.      OBJECTIVE Physical Exam: Vitals:   02/01/20 1513  BP: 123/77  Pulse: 83  Weight: 120 lb (54.4 kg)    Physical Exam Constitutional:      General: She is not in acute distress. Pulmonary:     Effort: Pulmonary effort is normal.  Abdominal:     General: There is no distension.     Palpations: Abdomen is soft.     Tenderness: There is no abdominal tenderness. There is no rebound.  Musculoskeletal:        General: No swelling. Normal range of motion.  Skin:    General: Skin is warm and dry.     Findings: No rash.  Neurological:     Mental Status: She is alert and oriented to person, place, and time.  Psychiatric:        Mood and  Affect: Mood normal.        Behavior: Behavior normal.      GU / Detailed Urogynecologic Evaluation:  Pelvic Exam: Normal external female genitalia; Bartholin's and Skene's glands normal in appearance; urethral meatus normal in appearance, superficial midline 2 x2.5cm cystic structure below the urethra, mobile and nontender  Speculum exam reveals normal vaginal mucosa without atrophy. Cervix normal appearance. Uterus normal single, nontender. Adnexa no mass, fullness, tenderness.    Pelvic floor strength I/V  Pelvic floor musculature: Right levator tender, Right obturator non-tender, Left levator non-tender, Left obturator non-tender       POP-Q:   POP-Q  -3                                            Aa   -3                                            Ba  -8                                              C   3                                            Gh  2                                            Pb  9                                            tvl   -2                                            Ap  -2                                            Bp  -8                                              D     Rectal Exam:  deferred  Post-Void Residual (PVR) by Bladder Scan: In order to evaluate bladder emptying, we discussed obtaining a postvoid residual and she agreed to this procedure.  Procedure: The ultrasound unit was placed on the patient's abdomen in the suprapubic region after the patient had voided. A PVR of 26 ml was obtained by bladder scan.  Laboratory Results: POC urine: trace protein I  visualized the urine specimen, noting the specimen to be dark yellow  ASSESSMENT AND PLAN Ms. Stiggers is a 44 y.o. with:  1. Vaginal mass   2. Recurrent UTI   3. Neurogenic bladder     1. Vaginal mass - The cyst appears to be very superficial and does not appear to be a skene's gland cyst. Most recent MRI gave a differential of skene or gartner duct cyst without a connection to the urethra; however, based on exam and location of cyst, diverticulum cannot be completely ruled out.  - Discussed that if we were to proceed with excision in the operating room, that there is a chance there could be a connection with the urethra, which would require repair of the urethra and prolonged catheter.  - She would like to delay surgery until the summer since she is having rotator cuff surgery in January and needs to wait until her daughter is home from college to help assist with her care.   2. Recurrent UTI - Currently went through 3 courses of Fosfomycin, will send urine culture today.  - Discussed non-antibiotic prevention of UTIs including vaginal estrogen, D-mannose and cranberry supplements. - She is perimenopausal so reasonable to  prescribe vaginal estrogen. Estrace cream 0.5g should be placed daily for 2 weeks then twice a week nightly. Advised to place just around the urethra.  - She will also buy D-mannose and cranberry supplements.   3. Neurogenic bladder - Per AUA guidelines, since she has been having urinary tract infections, will perform upper tract surveillance with renal ultrasound. Will also perform cystoscopy as this may also be helpful to confirm if there is a connection with the vaginal cyst.  - She has requested that we refill her catheter supplies. She gets Flo Cath 14 F from [employer]. She will contact them with our office information so they can send a refill request.   Return to office for cystoscopy   Jaquita Folds, MD    Medical Decision Making:   - Reviewed/ ordered a clinical laboratory test - Reviewed/ ordered a radiologic study - Reviewed/ ordered medicine test - Review and summation of prior records - Independent review specimen

## 2020-02-01 NOTE — Patient Instructions (Addendum)
1. Start vaginal estrogen nightly for two weeks then twice after, place around the urethra 2. Start supplement: cranberry and D-mannose 3. Return for cystoscopy

## 2020-02-06 ENCOUNTER — Telehealth: Payer: Self-pay | Admitting: *Deleted

## 2020-02-06 NOTE — Telephone Encounter (Signed)
Spoke with [facility].  Results from urine culture not in Epic.  They will fax results to our office [phone].  KW CMA

## 2020-02-07 ENCOUNTER — Encounter: Payer: Self-pay | Admitting: Obstetrics and Gynecology

## 2020-02-07 DIAGNOSIS — N390 Urinary tract infection, site not specified: Secondary | ICD-10-CM

## 2020-02-07 NOTE — Progress Notes (Signed)
Results faxed from [place] showing No Growth.

## 2020-02-09 ENCOUNTER — Encounter: Payer: Self-pay | Admitting: *Deleted

## 2020-02-16 ENCOUNTER — Encounter: Payer: Self-pay | Admitting: Dermatology

## 2020-02-16 ENCOUNTER — Ambulatory Visit (INDEPENDENT_AMBULATORY_CARE_PROVIDER_SITE_OTHER): Payer: Medicaid Other | Admitting: Dermatology

## 2020-02-16 ENCOUNTER — Other Ambulatory Visit: Payer: Self-pay

## 2020-02-16 DIAGNOSIS — D229 Melanocytic nevi, unspecified: Secondary | ICD-10-CM | POA: Diagnosis not present

## 2020-02-16 DIAGNOSIS — D485 Neoplasm of uncertain behavior of skin: Secondary | ICD-10-CM

## 2020-02-16 DIAGNOSIS — C4371 Malignant melanoma of right lower limb, including hip: Secondary | ICD-10-CM | POA: Diagnosis not present

## 2020-02-16 DIAGNOSIS — D225 Melanocytic nevi of trunk: Secondary | ICD-10-CM

## 2020-02-16 DIAGNOSIS — D2271 Melanocytic nevi of right lower limb, including hip: Secondary | ICD-10-CM | POA: Diagnosis not present

## 2020-02-16 DIAGNOSIS — C439 Malignant melanoma of skin, unspecified: Secondary | ICD-10-CM

## 2020-02-16 DIAGNOSIS — D239 Other benign neoplasm of skin, unspecified: Secondary | ICD-10-CM

## 2020-02-16 HISTORY — DX: Malignant melanoma of skin, unspecified: C43.9

## 2020-02-16 HISTORY — DX: Other benign neoplasm of skin, unspecified: D23.9

## 2020-02-16 NOTE — Patient Instructions (Signed)

## 2020-02-16 NOTE — Progress Notes (Signed)
New Patient Visit  Subjective  Melissa Pruitt is a 44 y.o. female who presents for the following: irregular nevi (on the right foot - patient states that she has a history of cancerous nevi on the left foot that was moved by a podiatrist a few years ago).  The following portions of the chart were reviewed this encounter and updated as appropriate:  Tobacco  Allergies  Meds  Problems  Med Hx  Surg Hx  Fam Hx     Review of Systems:  No other skin or systemic complaints except as noted in HPI or Assessment and Plan.  Objective  Well appearing patient in no apparent distress; mood and affect are within normal limits.  A focused examination was performed including face, neck, chest and back and right foot. Relevant physical exam findings are noted in the Assessment and Plan.  Objective  R mid med sole superior: 0.6 cm irregular brown macule      Objective  R mid med sole inferior: 0.4 cm irregular brown macule   Objective  R mid plantar sole: 0.7 cm irregular brown macule      Objective  R dorsum lat great toe: 0.6 cm irregular brown macule      Objective    L buttocks: Speckled brown macule   Lower back below tattoo: Dark brown macule   Assessment & Plan  Neoplasm of uncertain behavior of skin (4) R mid med sole superior  Epidermal / dermal shaving  Lesion diameter (cm):  0.6 Informed consent: discussed and consent obtained   Timeout: patient name, date of birth, surgical site, and procedure verified   Procedure prep:  Patient was prepped and draped in usual sterile fashion Prep type:  Isopropyl alcohol Anesthesia: the lesion was anesthetized in a standard fashion   Anesthetic:  1% lidocaine w/ epinephrine 1-100,000 buffered w/ 8.4% NaHCO3 Instrument used: flexible razor blade   Hemostasis achieved with: pressure, aluminum chloride and electrodesiccation   Outcome: patient tolerated procedure well   Post-procedure details: sterile dressing  applied and wound care instructions given   Dressing type: bandage and petrolatum    Specimen 1 - Surgical pathology Differential Diagnosis: D48.5 r/o dysplastic nevus  Check Margins: No 0.6 cm irregular brown macule  R mid med sole inferior  Epidermal / dermal shaving  Lesion diameter (cm):  0.4 Informed consent: discussed and consent obtained   Timeout: patient name, date of birth, surgical site, and procedure verified   Procedure prep:  Patient was prepped and draped in usual sterile fashion Prep type:  Isopropyl alcohol Anesthesia: the lesion was anesthetized in a standard fashion   Anesthetic:  1% lidocaine w/ epinephrine 1-100,000 buffered w/ 8.4% NaHCO3 Instrument used: flexible razor blade   Hemostasis achieved with: pressure, aluminum chloride and electrodesiccation   Outcome: patient tolerated procedure well   Post-procedure details: sterile dressing applied and wound care instructions given   Dressing type: bandage and petrolatum    Specimen 2 - Surgical pathology Differential Diagnosis: D48.5 r/o dysplastic nevus  Check Margins: No 0.4 cm irregular brown macule  R mid plantar sole  Epidermal / dermal shaving  Lesion diameter (cm):  0.7 Informed consent: discussed and consent obtained   Timeout: patient name, date of birth, surgical site, and procedure verified   Procedure prep:  Patient was prepped and draped in usual sterile fashion Prep type:  Isopropyl alcohol Anesthesia: the lesion was anesthetized in a standard fashion   Anesthetic:  1% lidocaine w/ epinephrine 1-100,000 buffered  w/ 8.4% NaHCO3 Instrument used: flexible razor blade   Hemostasis achieved with: pressure, aluminum chloride and electrodesiccation   Outcome: patient tolerated procedure well   Post-procedure details: sterile dressing applied and wound care instructions given   Dressing type: bandage and petrolatum    Specimen 3 - Surgical pathology Differential Diagnosis: D48.5 r/o  dysplastic nevus  Check Margins: No 0.7 cm irregular brown macule  R dorsum lat great toe  Epidermal / dermal shaving  Lesion diameter (cm):  0.6 Informed consent: discussed and consent obtained   Timeout: patient name, date of birth, surgical site, and procedure verified   Procedure prep:  Patient was prepped and draped in usual sterile fashion Prep type:  Isopropyl alcohol Anesthesia: the lesion was anesthetized in a standard fashion   Anesthetic:  1% lidocaine w/ epinephrine 1-100,000 buffered w/ 8.4% NaHCO3 Instrument used: flexible razor blade   Hemostasis achieved with: pressure, aluminum chloride and electrodesiccation   Outcome: patient tolerated procedure well   Post-procedure details: sterile dressing applied and wound care instructions given   Dressing type: bandage and petrolatum    Specimen 4 - Surgical pathology Differential Diagnosis: D48.5 r/o dysplastic nevus  Check Margins: No 0.6 cm irregular brown macule  Nevus (2) L buttocks; Lower back below tattoo  Recommend biopsy of the L low back below tattoo if today's neo comes back positive.   Melanocytic Nevi - Tan-brown and/or pink-flesh-colored symmetric macules and papules - Benign appearing on exam today - Observation - Call clinic for new or changing moles - Recommend daily use of broad spectrum spf 30+ sunscreen to sun-exposed areas.    Return in about 3 months (around 05/18/2020) for TBSE.  Luther Redo, CMA, am acting as scribe for Sarina Ser, MD .  Documentation: I have reviewed the above documentation for accuracy and completeness, and I agree with the above.  Sarina Ser, MD

## 2020-02-20 ENCOUNTER — Encounter: Payer: Self-pay | Admitting: Dermatology

## 2020-02-21 ENCOUNTER — Telehealth: Payer: Self-pay

## 2020-02-21 NOTE — Telephone Encounter (Signed)
Patient informed of pathology results by Dr. Nehemiah Massed. I contacted the patient and scheduled her surgery appointment, and also advised her of the pathology results from the right foot.

## 2020-02-21 NOTE — Telephone Encounter (Signed)
-----   Message from Ralene Bathe, MD sent at 02/21/2020  5:35 PM EST ----- 1. 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 Skin , R mid med sole inferior DYSPLASTIC COMPOUND NEVUS WITH MODERATE ATYPIA. DEEP MARGIN INVOLVED 3. 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 (AJCC 8TH EDITION#) PROCEDURE: SHAVE BIOPSY SPECIMEN ANATOMIC SITE: RIGHT MID PLANTAR SOLE HISTOLOGIC TYPE: ACRAL BRESLOW'S DEPTH/MAXIMUM TUMOR THICKNESS: 0.5 MM CLARK/ANATOMIC LEVEL: II MARGINS PERIPHERAL MARGINS: INVOLVED DEEP MARGIN: NOT INVOLVED ULCERATION: ABSENT SATELLITOSIS: ABSENT MITOTIC INDEX: 0 LYMPHO-VASCULAR INVASION: ABSENT NEUROTROPISM: ABSENT TUMOR-INFILTRATING LYMPHOCYTES: NON-BRISK TUMOR REGRESSION: ABSENT LYMPH NODES (IF APPLICABLE): N/A PATHOLOGIC STAGE: PT1A # AJCC 8TH EDITION: PT1A <0.8 W/O ULCER; PT1B <0.8 W/ ULCER OR 0.8-1.0 WITH OR W/O ULCER; PT2A >1.0-2.0 W/OUT ULCER; PT2B >1.0-2.0 W/ ULCER; PT3A >2.0-4.0 W/O ULCER; PT3B >2.0-4.0 W/ ULCER; PT4A >4.0 W/O ULCER; PT4B: >4.0 W/ ULCER. (AJCC MELANOMA EXPERT PANEL: [place] CANCER J CL[facility].2017 OCT 13) 4. Skin , R dorsum lat great toe DYSPLASTIC COMPOUND NEVUS WITH MODERATE ATYPIA. DEEP MARGIN INVOLVED  1,2,4 - are Moderate Dysplastic Recheck next visit May need further treatment if recurs 3- Cancer - melanoma Early Needs surgery Schedule surgery + Melanoma Matrix discussion Discussed with pt by phone today. (She asked about past pathology from Podiatrist surgery and we will see if we got report yet)

## 2020-02-22 ENCOUNTER — Other Ambulatory Visit: Payer: Self-pay

## 2020-02-22 ENCOUNTER — Ambulatory Visit
Admission: RE | Admit: 2020-02-22 | Discharge: 2020-02-22 | Disposition: A | Discharge status: Home / Self Care | Payer: Medicaid Other | Source: Ambulatory Visit | Attending: Obstetrics and Gynecology | Admitting: Obstetrics and Gynecology

## 2020-02-22 DIAGNOSIS — N390 Urinary tract infection, site not specified: Secondary | ICD-10-CM | POA: Diagnosis present

## 2020-02-29 ENCOUNTER — Telehealth: Payer: Self-pay | Admitting: *Deleted

## 2020-02-29 MED ORDER — ESTROGENS, CONJUGATED 0.625 MG/GM VA CREA: 1.0000 | TOPICAL_CREAM | VAGINAL | 11 refills | Status: AC

## 2020-02-29 NOTE — Addendum Note (Signed)
Addended by: Jaquita Folds on: 02/29/2020 08:49 AM   Modules accepted: Orders

## 2020-02-29 NOTE — Telephone Encounter (Signed)
Insurance requires failure of Premarin cream prior to approval of Estrace cream.  Pt requests that prescription be sent.  Thanks Sharrie Rothman CMA

## 2020-03-01 ENCOUNTER — Telehealth: Payer: Self-pay

## 2020-03-01 NOTE — Telephone Encounter (Signed)
We go into the fat down to the fascia of the muscle.  It will involve the lymph vessels and blood vessels because they run through the fat, but there are no special studies needed prior to the procedure.  The worst case scenario is that there may be swelling associated with the procedure and lymph disruption that typically will improve with healing and time.  There will also be scar that also will heal and improve with time. We can discuss further at surgery appt before we start. If she wants to make an appt to discuss further before surgery, we may do so.  Also can postpone surgery until she has all her questions answered.

## 2020-03-01 NOTE — Telephone Encounter (Signed)
Patient is questioning

## 2020-03-01 NOTE — Telephone Encounter (Signed)
Called patient but no answer and no voicemail set up.

## 2020-03-01 NOTE — Telephone Encounter (Signed)
Patient is coming in to discuss with Dr. Nehemiah Massed.

## 2020-03-01 NOTE — Telephone Encounter (Signed)
Patient called regarding her upcoming surgery for the melanoma on the bottom of her foot. She is just concerned regarding how deep you may go down.  I did explain the basic procedures to her but she just questioned about going near her lymphatic system and now having any digital imaging done.

## 2020-03-05 ENCOUNTER — Telehealth: Payer: Self-pay | Admitting: Podiatry

## 2020-03-05 ENCOUNTER — Ambulatory Visit: Payer: Medicaid Other | Admitting: Dermatology

## 2020-03-05 ENCOUNTER — Other Ambulatory Visit: Payer: Self-pay

## 2020-03-05 DIAGNOSIS — C4371 Malignant melanoma of right lower limb, including hip: Secondary | ICD-10-CM

## 2020-03-05 DIAGNOSIS — Z8541 Personal history of malignant neoplasm of cervix uteri: Secondary | ICD-10-CM

## 2020-03-05 DIAGNOSIS — C439 Malignant melanoma of skin, unspecified: Secondary | ICD-10-CM

## 2020-03-05 NOTE — Progress Notes (Signed)
   Follow-Up Visit   Subjective  Melissa Pruitt is a 44 y.o. female who presents for the following: Follow-up (Patient here today to discuss treatment for biopsy proven MM at right foot. ).  The following portions of the chart were reviewed this encounter and updated as appropriate:  Tobacco  Allergies  Meds  Problems  Med Hx  Surg Hx  Fam Hx     Review of Systems:  No other skin or systemic complaints except as noted in HPI or Assessment and Plan.  Objective  Well appearing patient in no apparent distress; mood and affect are within normal limits.  A focused examination was performed including right foot. Relevant physical exam findings are noted in the Assessment and Plan.  Objective  Right Mid Plantar Sole: Healing biopsy site   Assessment & Plan  Malignant melanoma of skin ([place]) Right Mid Plantar Sole  Discussed pathology with patient and recommend surgical excision with clear margins and monitoring. Will schedule patient for TBSE.  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 Patient advised that there is a low risk that melanoma has progressed to lymphatic system. She has a personal history of cervical cancer.  Recommend [place] testing.   Discussed diagnosis in detail including significance of melanoma diagnosis which can be potentially lethal.  Discussed treatment recommendations in detail advising that treatment recommendations are based on longitudinal studies and retrospective studies and are nationwide protocols.  Advised there is always potential for recurrence even after definitive treatment.  After definitive treatment, we recommend total-body skin exams every 3 months for a year; then every 4 months for a year; then every 6 months for 3 years.  At 5 years post treatment, if all looks good we would recommend at least yearly total-body skin exams for the rest of your  life.  The patient was given time for questions and these were answered.  We recommend frequent self skin examinations; photoprotection with sunscreen, sun protective clothing, hats, sunglasses and sun avoidance.  If the patient notices any new or changing skin lesions the patient should return to the office immediately for evaluation.   Return for Surgery.  Graciella Belton, RMA, am acting as scribe for Sarina Ser, MD . Documentation: I have reviewed the above documentation for accuracy and completeness, and I agree with the above.  Sarina Ser, MD

## 2020-03-05 NOTE — Patient Instructions (Signed)

## 2020-03-05 NOTE — Telephone Encounter (Signed)
Patient called in stating she wanted to be seen for nerve block before going into surgery with dermatologists, she stated it was a peripheral nerve block and said Dr. Amalia Hailey told her it could be done in the office. Told patient would send info over to [facility] and give call back once we worked it out, please advise

## 2020-03-05 NOTE — Telephone Encounter (Signed)
Pt stated that she was seen at [facility] by Dr Barkley Bruns and she was wanting and injection for Neuropathy/Arthritis treatment. She wanted to know if Dr. Amalia Hailey does the same thing. They don't take her insurance anymore. She is willing to get her medical records sent over from previous podiatrist.

## 2020-03-13 ENCOUNTER — Encounter: Payer: Self-pay | Admitting: Dermatology

## 2020-03-20 ENCOUNTER — Ambulatory Visit (INDEPENDENT_AMBULATORY_CARE_PROVIDER_SITE_OTHER): Payer: Medicaid Other | Admitting: Dermatology

## 2020-03-20 ENCOUNTER — Other Ambulatory Visit: Payer: Self-pay

## 2020-03-20 DIAGNOSIS — D485 Neoplasm of uncertain behavior of skin: Secondary | ICD-10-CM

## 2020-03-20 DIAGNOSIS — C4371 Malignant melanoma of right lower limb, including hip: Secondary | ICD-10-CM

## 2020-03-20 MED ORDER — MUPIROCIN 2 % EX OINT: 1.0000 "application " | TOPICAL_OINTMENT | Freq: Every day | CUTANEOUS | 0 refills | Status: DC

## 2020-03-20 NOTE — Progress Notes (Signed)
   Follow-Up Visit   Subjective  Melissa Pruitt is a 44 y.o. female who presents for the following: Procedure (Biopsy proven Melanoma in situ of right mid plantar sole - [facility] today).  The following portions of the chart were reviewed this encounter and updated as appropriate:   Tobacco  Allergies  Meds  Problems  Med Hx  Surg Hx  Fam Hx     Review of Systems:  No other skin or systemic complaints except as noted in HPI or Assessment and Plan.  Objective  Well appearing patient in no apparent distress; mood and affect are within normal limits.  A focused examination was performed including right foot. Relevant physical exam findings are noted in the Assessment and Plan.  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  Skin excision  Lesion length (cm):  0.7 Lesion width (cm):  0.7 Margin per side (cm):  0.5 Total excision diameter (cm):  1.7 Informed consent: discussed and consent obtained   Timeout: patient name, date of birth, surgical site, and procedure verified   Procedure prep:  Patient was prepped and draped in usual sterile fashion Prep type:  Isopropyl alcohol and povidone-iodine Anesthesia: the lesion was anesthetized in a standard fashion   Anesthetic:  1% lidocaine w/ epinephrine 1-100,000 buffered w/ 8.4% NaHCO3 Instrument used: #15 blade   Hemostasis achieved with: pressure   Hemostasis achieved with comment:  Electrocautery Outcome: patient tolerated procedure well with no complications   Post-procedure details: sterile dressing applied and wound care instructions given   Dressing type: bandage and pressure dressing (mupirocin)   Additional details:  Left for secondary intention healing.  Protocol dictates a 1.0 cm margin but due to location, taking a 0.5 cm margin. Will plan re-excision, if needed.  mupirocin ointment (BACTROBAN) 2 %  Tag - 12 o'clock posterior  edge facing the heel   Return in about 1 week (around 03/27/2020).   I, Ashok Cordia, CMA, am acting as scribe for Sarina Ser, MD .  Documentation: I have reviewed the above documentation for accuracy and completeness, and I agree with the above.  Sarina Ser, MD

## 2020-03-20 NOTE — Patient Instructions (Signed)

## 2020-03-21 ENCOUNTER — Telehealth: Payer: Self-pay

## 2020-03-21 NOTE — Telephone Encounter (Signed)
Spoke with patient regarding surgery. She is doing fine, has a little bit of pain but is taking tylenol/hd

## 2020-03-26 ENCOUNTER — Encounter: Payer: Self-pay | Admitting: Dermatology

## 2020-03-27 ENCOUNTER — Other Ambulatory Visit: Payer: Self-pay

## 2020-03-27 ENCOUNTER — Ambulatory Visit (INDEPENDENT_AMBULATORY_CARE_PROVIDER_SITE_OTHER): Payer: Medicaid Other | Admitting: Dermatology

## 2020-03-27 ENCOUNTER — Encounter: Payer: Self-pay | Admitting: Dermatology

## 2020-03-27 DIAGNOSIS — C4371 Malignant melanoma of right lower limb, including hip: Secondary | ICD-10-CM

## 2020-03-27 DIAGNOSIS — Z4802 Encounter for removal of sutures: Secondary | ICD-10-CM

## 2020-03-27 DIAGNOSIS — C439 Malignant melanoma of skin, unspecified: Secondary | ICD-10-CM

## 2020-03-27 NOTE — Progress Notes (Signed)
   Follow-Up Visit   Subjective  Melissa Pruitt is a 44 y.o. female who presents for the following: Follow-up (1 week f/u R mid plantar sole biopsy proven Melanoma excision 03/20/2020 results confirmed margins free ).  The following portions of the chart were reviewed this encounter and updated as appropriate:   Tobacco  Allergies  Meds  Problems  Med Hx  Surg Hx  Fam Hx     Review of Systems:  No other skin or systemic complaints except as noted in HPI or Assessment and Plan.  Objective  Well appearing patient in no apparent distress; mood and affect are within normal limits.  A focused examination was performed including R mid plantar sole . Relevant physical exam findings are noted in the Assessment and Plan.  Objective  Right mid plantar sole: Healing ulcer    Assessment & Plan  Melanoma of skin ([place]) Right mid plantar sole  Biopsy proven Melanoma margins clear discussed   Encounter for wound check up  - Incision site at the R mid plantar sole is clean, dry and intact - Wound cleansed, with Puracyn, pat dry, applied Mupirocin ointment and wrap  - Discussed pathology results showing Margins clear of Melanoma   - Scars remodel for a full year. - Patient advised to call with any concerns or if they notice any new or changing lesions.     Return in about 1 month (around 04/27/2020).  IMarye Round, CMA, am acting as scribe for Sarina Ser, MD .  Documentation: I have reviewed the above documentation for accuracy and completeness, and I agree with the above.  Sarina Ser, MD

## 2020-04-01 ENCOUNTER — Encounter: Payer: Self-pay | Admitting: Dermatology

## 2020-04-25 ENCOUNTER — Other Ambulatory Visit: Payer: Self-pay

## 2020-04-25 DIAGNOSIS — D485 Neoplasm of uncertain behavior of skin: Secondary | ICD-10-CM

## 2020-04-25 MED ORDER — MUPIROCIN 2 % EX OINT: 1.0000 "application " | TOPICAL_OINTMENT | Freq: Every day | CUTANEOUS | 0 refills | Status: DC

## 2020-04-25 NOTE — Progress Notes (Signed)
Mupirocin RFs approved

## 2020-05-03 ENCOUNTER — Ambulatory Visit: Payer: Medicaid Other | Admitting: Dermatology

## 2020-05-08 ENCOUNTER — Other Ambulatory Visit: Payer: Self-pay | Admitting: Obstetrics and Gynecology

## 2020-05-08 DIAGNOSIS — N938 Other specified abnormal uterine and vaginal bleeding: Secondary | ICD-10-CM

## 2020-05-08 NOTE — Telephone Encounter (Signed)
Patient called back in after making her appointment, patient has transferred her care to another office. Patient states that the refill request was supposed to go to her current provider and not Dr. Marcelline Mates.

## 2020-05-10 ENCOUNTER — Telehealth: Payer: Self-pay

## 2020-05-10 ENCOUNTER — Other Ambulatory Visit: Payer: Self-pay

## 2020-05-10 ENCOUNTER — Ambulatory Visit: Payer: Medicaid Other | Admitting: Dermatology

## 2020-05-10 ENCOUNTER — Encounter: Payer: Self-pay | Admitting: Dermatology

## 2020-05-10 DIAGNOSIS — Z85820 Personal history of malignant melanoma of skin: Secondary | ICD-10-CM

## 2020-05-10 NOTE — Progress Notes (Signed)
   Follow-Up Visit   Subjective  Melissa Pruitt is a 45 y.o. female who presents for the following: Hx of Melanoma (R mid plantar sole, 22m f/u, mupirocin and woundcare, s/p excision from 03/20/20, still painful).  The following portions of the chart were reviewed this encounter and updated as appropriate:   Tobacco  Allergies  Meds  Problems  Med Hx  Surg Hx  Fam Hx     Review of Systems:  No other skin or systemic complaints except as noted in HPI or Assessment and Plan.  Objective  Well appearing patient in no apparent distress; mood and affect are within normal limits.  A focused examination was performed including R foot. Relevant physical exam findings are noted in the Assessment and Plan.  Objective  Right plantar sole: Healing excision site 1.2cm   Assessment & Plan  History of melanoma Right plantar sole 46month s/p excision 03/20/20  Healing excision site.  Wound cleansed with Puracyn, mupirocin oint applied followed by non stick telfa and coban wrap.  Return for as scheduled for TBSE, hx of melanoma.  I, [name], RMA, am acting as scribe for Sarina Ser, MD .  Documentation: I have reviewed the above documentation for accuracy and completeness, and I agree with the above.  Sarina Ser, MD

## 2020-05-10 NOTE — Telephone Encounter (Signed)
Spoke with patient yesterday, 05/09/20 and advised her of [place] testing. Copy of [place] [facility] testing will be in patient's media, scanned in.

## 2020-05-23 ENCOUNTER — Encounter: Payer: Self-pay | Admitting: Dermatology

## 2020-05-31 ENCOUNTER — Other Ambulatory Visit: Payer: Self-pay | Admitting: Obstetrics and Gynecology

## 2020-05-31 DIAGNOSIS — N938 Other specified abnormal uterine and vaginal bleeding: Secondary | ICD-10-CM

## 2020-06-01 ENCOUNTER — Encounter: Payer: Self-pay | Admitting: Obstetrics and Gynecology

## 2020-06-01 DIAGNOSIS — Z30019 Encounter for initial prescription of contraceptives, unspecified: Secondary | ICD-10-CM

## 2020-06-01 DIAGNOSIS — N938 Other specified abnormal uterine and vaginal bleeding: Secondary | ICD-10-CM

## 2020-06-01 MED ORDER — NORETHINDRONE 0.35 MG PO TABS: 1.0000 | ORAL_TABLET | Freq: Every day | ORAL | 1 refills | Status: DC

## 2020-06-01 NOTE — Telephone Encounter (Signed)
Patient requesting appt for annual exam/ contraceptive management. [facility]. Prescribed 2 months of norethindrone until she is able to get an appointment.

## 2020-06-05 ENCOUNTER — Telehealth: Payer: Self-pay | Admitting: Lactation Services

## 2020-06-05 NOTE — Telephone Encounter (Signed)
Patient called and LM on nurse voicemail. She indicates she has seen many different Gyn doctors and would like to know is she needs to keep her appointment on 3/21 or can she get a prescription refill from one of the previous Gyn's. She would like a call back.

## 2020-06-11 ENCOUNTER — Other Ambulatory Visit: Payer: Self-pay

## 2020-06-11 ENCOUNTER — Ambulatory Visit: Payer: Medicaid Other | Admitting: Dermatology

## 2020-06-11 ENCOUNTER — Encounter: Payer: Self-pay | Admitting: Dermatology

## 2020-06-11 DIAGNOSIS — D2272 Melanocytic nevi of left lower limb, including hip: Secondary | ICD-10-CM

## 2020-06-11 DIAGNOSIS — L814 Other melanin hyperpigmentation: Secondary | ICD-10-CM

## 2020-06-11 DIAGNOSIS — D2271 Melanocytic nevi of right lower limb, including hip: Secondary | ICD-10-CM | POA: Diagnosis not present

## 2020-06-11 DIAGNOSIS — D1800 Hemangioma unspecified site: Secondary | ICD-10-CM

## 2020-06-11 DIAGNOSIS — L304 Erythema intertrigo: Secondary | ICD-10-CM | POA: Diagnosis not present

## 2020-06-11 DIAGNOSIS — D225 Melanocytic nevi of trunk: Secondary | ICD-10-CM | POA: Diagnosis not present

## 2020-06-11 DIAGNOSIS — D489 Neoplasm of uncertain behavior, unspecified: Secondary | ICD-10-CM

## 2020-06-11 DIAGNOSIS — L821 Other seborrheic keratosis: Secondary | ICD-10-CM

## 2020-06-11 DIAGNOSIS — Z1283 Encounter for screening for malignant neoplasm of skin: Secondary | ICD-10-CM | POA: Diagnosis not present

## 2020-06-11 DIAGNOSIS — Z85820 Personal history of malignant melanoma of skin: Secondary | ICD-10-CM | POA: Diagnosis not present

## 2020-06-11 DIAGNOSIS — Z86018 Personal history of other benign neoplasm: Secondary | ICD-10-CM | POA: Diagnosis not present

## 2020-06-11 DIAGNOSIS — D229 Melanocytic nevi, unspecified: Secondary | ICD-10-CM

## 2020-06-11 DIAGNOSIS — L578 Other skin changes due to chronic exposure to nonionizing radiation: Secondary | ICD-10-CM

## 2020-06-11 MED ORDER — HYDROCORTISONE 2.5 % EX CREA: TOPICAL_CREAM | Freq: Every day | CUTANEOUS | 7 refills | Status: AC

## 2020-06-11 MED ORDER — KETOCONAZOLE 2 % EX CREA: 1.0000 "application " | TOPICAL_CREAM | Freq: Every day | CUTANEOUS | 7 refills | Status: AC

## 2020-06-11 NOTE — Progress Notes (Signed)
Follow-Up Visit   Subjective  Melissa Pruitt is a 45 y.o. female who presents for the following: 2 month follow up and tbse (Patient is here today for tbse. She has history of melanoma on right mid plantar sole. She also has history of dysplastic nevi on right mid sole, right medial sole inferior, and right dorsum lateral great toe. ). The patient presents for Total-Body Skin Exam (TBSE) for skin cancer screening and mole check.  The following portions of the chart were reviewed this encounter and updated as appropriate:  Tobacco  Allergies  Meds  Problems  Med Hx  Surg Hx  Fam Hx      Objective  Well appearing patient in no apparent distress; mood and affect are within normal limits.  A full examination was performed including scalp, head, eyes, ears, nose, lips, neck, chest, axillae, abdomen, back, buttocks, bilateral upper extremities, bilateral lower extremities, hands, feet, fingers, toes, fingernails, and toenails. All findings within normal limits unless otherwise noted below.  Objective  Left proximal lateral  thigh superior: 0.3 cm irregular brown macule      Objective  Right mid to upper back 2 cm lateral to spine:  0.4 cm irregular brown macule      Objective  Left proximal lateral thigh inferior: 0.6 cm irregular brown macule      Objective  Right distal posterior thigh: 0.3 cm irregular brown macule      Objective  Left Axilla: erythema and maceration of L axilla   Assessment & Plan  Neoplasm of uncertain behavior (4) Left proximal lateral  thigh superior  Epidermal / dermal shaving  Lesion diameter (cm):  0.3 Informed consent: discussed and consent obtained   Timeout: patient name, date of birth, surgical site, and procedure verified   Procedure prep:  Patient was prepped and draped in usual sterile fashion Prep type:  Isopropyl alcohol Anesthesia: the lesion was anesthetized in a standard fashion   Anesthetic:  1% lidocaine w/  epinephrine 1-100,000 buffered w/ 8.4% NaHCO3 Instrument used: flexible razor blade   Hemostasis achieved with: pressure, aluminum chloride and electrodesiccation   Outcome: patient tolerated procedure well   Post-procedure details: sterile dressing applied and wound care instructions given   Dressing type: bandage and petrolatum    Specimen 1 - Surgical pathology Differential Diagnosis: r/o dysplastic nevus vs atypia  Check Margins: No 0.3 cm irregular brown macule  Right mid to upper back 2 cm lateral to spine  Epidermal / dermal shaving  Lesion diameter (cm):  0.4 Informed consent: discussed and consent obtained   Timeout: patient name, date of birth, surgical site, and procedure verified   Procedure prep:  Patient was prepped and draped in usual sterile fashion Prep type:  Isopropyl alcohol Anesthesia: the lesion was anesthetized in a standard fashion   Anesthetic:  1% lidocaine w/ epinephrine 1-100,000 buffered w/ 8.4% NaHCO3 Instrument used: flexible razor blade   Hemostasis achieved with: pressure, aluminum chloride and electrodesiccation   Outcome: patient tolerated procedure well   Post-procedure details: sterile dressing applied and wound care instructions given   Dressing type: bandage and petrolatum    Specimen 2 - Surgical pathology Differential Diagnosis: r/o dysplastic nevus vs atypia  Check Margins: No 0.4 cm irregular brown macule  Left proximal lateral thigh inferior  Epidermal / dermal shaving  Lesion diameter (cm):  0.6 Informed consent: discussed and consent obtained   Timeout: patient name, date of birth, surgical site, and procedure verified   Procedure prep:  Patient was prepped and draped in usual sterile fashion Prep type:  Isopropyl alcohol Anesthesia: the lesion was anesthetized in a standard fashion   Anesthetic:  1% lidocaine w/ epinephrine 1-100,000 buffered w/ 8.4% NaHCO3 Instrument used: flexible razor blade   Hemostasis achieved with:  pressure, aluminum chloride and electrodesiccation   Outcome: patient tolerated procedure well   Post-procedure details: sterile dressing applied and wound care instructions given   Dressing type: bandage and petrolatum    Specimen 3 - Surgical pathology Differential Diagnosis: r/o dysplastic nevus vs atypia   Check Margins: No 0.6 cm irregular brown macule  Right distal posterior thigh  Epidermal / dermal shaving  Lesion diameter (cm):  0.3 Informed consent: discussed and consent obtained   Timeout: patient name, date of birth, surgical site, and procedure verified   Procedure prep:  Patient was prepped and draped in usual sterile fashion Prep type:  Isopropyl alcohol Anesthesia: the lesion was anesthetized in a standard fashion   Anesthetic:  1% lidocaine w/ epinephrine 1-100,000 buffered w/ 8.4% NaHCO3 Instrument used: flexible razor blade   Hemostasis achieved with: pressure, aluminum chloride and electrodesiccation   Outcome: patient tolerated procedure well   Post-procedure details: sterile dressing applied and wound care instructions given   Dressing type: bandage and petrolatum    Specimen 4 - Surgical pathology Differential Diagnosis: r/o dysplastic nevus vs atypia   Check Margins: No 0.3 cm irregular brown macule  Dysplastic nevus vs atypia   Erythema intertrigo Left Axilla -secondary to decreased range of motion from rotator cuff issue Intertrigo is a chronic recurrent rash that occurs in skin fold areas that may be associated with friction; heat; moisture; yeast; fungus; and bacteria.  It is exacerbated by increased movement / activity; sweating; and higher atmospheric temperature.  When rash is active: Start Ketoconazole 2 % cream apply topically to left axilla at bedtime on Tuesday, Thursday, and Saturday. 60 g  7 rf  Start Hydrocortisone 2.5 % cream apply topically to left axilla at bedtime on Monday, Wednesday, and Friday. 28 g   7 rf   Start otc Zeasorb  Powder apply to area when rash is no longer active  Ordered Medications: ketoconazole (NIZORAL) 2 % cream hydrocortisone 2.5 % cream  Skin cancer screening  Lentigines - Scattered tan macules - Due to sun exposure - Benign-appering, observe - Recommend daily broad spectrum sunscreen SPF 30+ to sun-exposed areas, reapply every 2 hours as needed. - Call for any changes  Seborrheic Keratoses - Stuck-on, waxy, tan-brown papules and plaques  - Discussed benign etiology and prognosis. - Observe - Call for any changes  Melanocytic Nevi - Tan-brown and/or pink-flesh-colored symmetric macules and papules - Benign appearing on exam today - Observation - Call clinic for new or changing moles - Recommend daily use of broad spectrum spf 30+ sunscreen to sun-exposed areas.   Hemangiomas - Red papules - Discussed benign nature - Observe - Call for any changes  Actinic Damage - Chronic, secondary to cumulative UV/sun exposure - diffuse scaly erythematous macules with underlying dyspigmentation - Recommend daily broad spectrum sunscreen SPF 30+ to sun-exposed areas, reapply every 2 hours as needed.  - Call for new or changing lesions.  History of Melanoma Right mid plantar side  Healing well  - No evidence of recurrence today - No lymphadenopathy - Recommend regular full body skin exams - Recommend daily broad spectrum sunscreen SPF 30+ to sun-exposed areas, reapply every 2 hours as needed.  - Call if any new or changing  lesions are noted between office visits  History of Dysplastic Nevi Right middle sole superior, right medial sole inferior, right dorsum lateral great toe - No evidence of recurrence today - Recommend regular full body skin exams - Recommend daily broad spectrum sunscreen SPF 30+ to sun-exposed areas, reapply every 2 hours as needed.  - Call if any new or changing lesions are noted between office visits  Skin cancer screening performed today.  Return in about  3 months (around 09/08/2020) for follow up on biopsy sites .  IRuthell Rummage, CMA, am acting as scribe for Sarina Ser, MD.  Documentation: I have reviewed the above documentation for accuracy and completeness, and I agree with the above.  Sarina Ser, MD

## 2020-06-11 NOTE — Patient Instructions (Addendum)
Biopsy Wound Care Instructions  1. Leave the original bandage on for 24 hours if possible.  If the bandage becomes soaked or soiled before that time, it is OK to remove it and examine the wound.  A small amount of post-operative bleeding is normal.  If excessive bleeding occurs, remove the bandage, place gauze over the site and apply continuous pressure (no peeking) over the area for 30 minutes. If this does not work, please call our clinic as soon as possible or page your doctor if it is after hours.   2. Once a day, cleanse the wound with soap and water. It is fine to shower. If a thick crust develops you may use a Q-tip dipped into dilute hydrogen peroxide (mix 1:1 with water) to dissolve it.  Hydrogen peroxide can slow the healing process, so use it only as needed.    3. After washing, apply petroleum jelly (Vaseline) or an antibiotic ointment if your doctor prescribed one for you, followed by a bandage.    4. For best healing, the wound should be covered with a layer of ointment at all times. If you are not able to keep the area covered with a bandage to hold the ointment in place, this may mean re-applying the ointment several times a day.  Continue this wound care until the wound has healed and is no longer open.   Itching and mild discomfort is normal during the healing process. However, if you develop pain or severe itching, please call our office.   If you have any discomfort, you can take Tylenol (acetaminophen) or ibuprofen as directed on the bottle. (Please do not take these if you have an allergy to them or cannot take them for another reason).  Some redness, tenderness and white or yellow material in the wound is normal healing.  If the area becomes very sore and red, or develops a thick yellow-green material (pus), it may be infected; please notify us.    If you have stitches, return to clinic as directed to have the stitches removed. You will continue wound care for 2-3 days after  the stitches are removed.   Wound healing continues for up to one year following surgery. It is not unusual to experience pain in the scar from time to time during the interval.  If the pain becomes severe or the scar thickens, you should notify the office.    A slight amount of redness in a scar is expected for the first six months.  After six months, the redness will fade and the scar will soften and fade.  The color difference becomes less noticeable with time.  If there are any problems, return for a post-op surgery check at your earliest convenience.  To improve the appearance of the scar, you can use silicone scar gel, cream, or sheets (such as Mederma or Serica) every night for up to one year. These are available over the counter (without a prescription).  Please call our office at [phone] for any questions or concerns.   For Rash left underarm  Zeasorb Powder - use on days when rash is not active apply to left underarm

## 2020-06-12 ENCOUNTER — Encounter: Payer: Medicaid Other | Admitting: Obstetrics and Gynecology

## 2020-06-12 ENCOUNTER — Encounter: Payer: Self-pay | Admitting: Dermatology

## 2020-06-15 ENCOUNTER — Telehealth: Payer: Self-pay

## 2020-06-15 NOTE — Telephone Encounter (Signed)
Advised patient of results/hd  

## 2020-06-15 NOTE — Telephone Encounter (Signed)
-----   Message from Ralene Bathe, MD sent at 06/14/2020  2:24 PM EST ----- Diagnosis 1. Skin , left proximal lateral thigh superior DYSPLASTIC COMPOUND NEVUS WITH SEVERE ATYPIA, CLOSE TO MARGIN, SEE DESCRIPTION 2. Skin , right mid to upper back 2 cm lateral to spine DYSPLASTIC JUNCTIONAL NEVUS WITH SEVERE ATYPIA, CLOSE TO MARGIN, SEE DESCRIPTION 3. Skin , left proximal lateral thigh inferior DYSPLASTIC JUNCTIONAL NEVUS WITH MODERATE ATYPIA, CLOSE TO MARGIN 4. Skin , right distal posterior thigh DYSPLASTIC COMPOUND NEVUS WITH MODERATE ATYPIA, PERIPHERAL AND DEEP MARGINS INVOLVED  1&2- Severe dysplastic Margins clear but close May need further treatment Will discuss on follow up 3&4- Moderate dysplastic Recheck next visit

## 2020-07-02 ENCOUNTER — Ambulatory Visit (INDEPENDENT_AMBULATORY_CARE_PROVIDER_SITE_OTHER): Payer: Medicaid Other | Admitting: Family Medicine

## 2020-07-02 ENCOUNTER — Other Ambulatory Visit: Payer: Self-pay

## 2020-07-02 ENCOUNTER — Other Ambulatory Visit (HOSPITAL_COMMUNITY)
Admission: RE | Admit: 2020-07-02 | Discharge: 2020-07-02 | Disposition: A | Discharge status: Home / Self Care | Payer: Medicaid Other | Source: Ambulatory Visit | Attending: Family Medicine | Admitting: Family Medicine

## 2020-07-02 ENCOUNTER — Encounter: Payer: Self-pay | Admitting: Family Medicine

## 2020-07-02 VITALS — BP 92/58 | HR 61

## 2020-07-02 DIAGNOSIS — Z113 Encounter for screening for infections with a predominantly sexual mode of transmission: Secondary | ICD-10-CM | POA: Insufficient documentation

## 2020-07-02 DIAGNOSIS — M76899 Other specified enthesopathies of unspecified lower limb, excluding foot: Secondary | ICD-10-CM | POA: Insufficient documentation

## 2020-07-02 DIAGNOSIS — Z91018 Allergy to other foods: Secondary | ICD-10-CM | POA: Insufficient documentation

## 2020-07-02 DIAGNOSIS — Z1231 Encounter for screening mammogram for malignant neoplasm of breast: Secondary | ICD-10-CM | POA: Diagnosis not present

## 2020-07-02 DIAGNOSIS — N938 Other specified abnormal uterine and vaginal bleeding: Secondary | ICD-10-CM | POA: Diagnosis not present

## 2020-07-02 DIAGNOSIS — C4371 Malignant melanoma of right lower limb, including hip: Secondary | ICD-10-CM | POA: Diagnosis not present

## 2020-07-02 DIAGNOSIS — J300 Vasomotor rhinitis: Secondary | ICD-10-CM | POA: Insufficient documentation

## 2020-07-02 DIAGNOSIS — C439 Malignant melanoma of skin, unspecified: Secondary | ICD-10-CM | POA: Insufficient documentation

## 2020-07-02 DIAGNOSIS — L500 Allergic urticaria: Secondary | ICD-10-CM | POA: Insufficient documentation

## 2020-07-02 MED ORDER — NORETHINDRONE 0.35 MG PO TABS: 1.0000 | ORAL_TABLET | Freq: Every day | ORAL | 1 refills | Status: DC

## 2020-07-02 MED ORDER — ESTRADIOL 1 MG PO TABS: 1.0000 mg | ORAL_TABLET | Freq: Every day | ORAL | 2 refills | Status: DC

## 2020-07-02 NOTE — Progress Notes (Signed)
   Subjective:    Patient ID: Melissa Pruitt is a 45 y.o. female presenting with Birth control refil  on 07/02/2020  HPI: Has some vaginal disharge. 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 She has been on POP's and takes some estrace if she has breakthrough bleeding. Has been given this since her last child. She has irregular cycles. Has spina bifida and self-caths 6x/day.  Review of Systems  Constitutional: Negative for chills and fever.  Respiratory: Negative for shortness of breath.   Cardiovascular: Negative for chest pain.  Gastrointestinal: Negative for abdominal pain, nausea and vomiting.  Genitourinary: Negative for dysuria.  Skin: Negative for rash.      Objective:    BP (!) 92/58   Pulse 61  Physical Exam Constitutional:      General: She is not in acute distress.    Appearance: She is well-developed.  HENT:     Head: Normocephalic and atraumatic.  Eyes:     General: No scleral icterus. Cardiovascular:     Rate and Rhythm: Normal rate.  Pulmonary:     Effort: Pulmonary effort is normal.  Abdominal:     Palpations: Abdomen is soft.  Musculoskeletal:     Cervical back: Neck supple.  Skin:    General: Skin is warm and dry.  Neurological:     Mental Status: She is alert and oriented to person, place, and time.         Assessment & Plan:   Problem List Items Addressed This Visit      Unprioritized   Melanoma ([place])    Other Visit Diagnoses    Screen for STD (sexually transmitted disease)    -  Primary   Relevant Orders   HIV Antibody (routine testing w rflx)   RPR   Cervicovaginal ancillary only( [place])   Hepatitis B surface antigen   Hepatitis C antibody   DUB (dysfunctional uterine bleeding)       Relevant Medications   norethindrone (MICRONOR) 0.35 MG tablet   estradiol (ESTRACE) 1 MG tablet   Breast cancer screening by mammogram       Relevant Orders   MM 3D SCREEN BREAST BILATERAL      Return in about 1 year (around  07/02/2021), or if symptoms worsen or fail to improve, for a CPE.  Donnamae Jude 07/02/2020 9:55 AM

## 2020-07-03 LAB — HEPATITIS B SURFACE ANTIGEN: Hepatitis B Surface Ag: NEGATIVE

## 2020-07-03 LAB — CERVICOVAGINAL ANCILLARY ONLY
Bacterial Vaginitis (gardnerella): NEGATIVE
Candida Glabrata: NEGATIVE
Candida Vaginitis: NEGATIVE
Chlamydia: NEGATIVE
Comment: NEGATIVE
Comment: NEGATIVE
Comment: NEGATIVE
Comment: NEGATIVE
Comment: NEGATIVE
Comment: NORMAL
Neisseria Gonorrhea: NEGATIVE
Trichomonas: NEGATIVE

## 2020-07-03 LAB — HEPATITIS C ANTIBODY: Hep C Virus Ab: 0.2 s/co ratio (ref 0.0–0.9)

## 2020-07-03 LAB — HIV ANTIBODY (ROUTINE TESTING W REFLEX): HIV Screen 4th Generation wRfx: NONREACTIVE

## 2020-07-03 LAB — RPR: RPR Ser Ql: NONREACTIVE

## 2020-07-06 NOTE — Progress Notes (Deleted)
CYSTOSCOPY  CC:  This is a 45 y.o. with {cysto problems:24783} who presents today for cystoscopy.  @ENCLABS @  There were no vitals taken for this visit.  CYSTOSCOPY: A time out was performed.  The periurethral area was prepped and draped in a sterile manner.  2% lidocaine jetpack was inserted at the urethral meatus and the urethra and bladder visualized with 12- and 70-degree scopes.  She had {Desc; normal/diminished:12763} urethral coaptation and {Desc; normal/abnormal w/wildcard:19060} urethral mucosa.  She had {Desc; normal/abnormal w/wildcard:19060} bladder mucosa. She had bilateral clear efflux from both ureteral orifices.  She had no squamous metaplasia at the trigone, no trabeculations, cellules or diverticuli.     ASSESSMENT:  46 y.o. with {cysto problems:24783}. Cystoscopy today is {Desc; normal/abnormal w/wildcard:19060}.  PLAN:  Follow-up to discuss findings and treatment options.  All questions answered and post-procedures instructions were given  Jaquita Folds, MD

## 2020-07-10 ENCOUNTER — Ambulatory Visit: Payer: Medicaid Other | Admitting: Obstetrics and Gynecology

## 2020-08-15 ENCOUNTER — Other Ambulatory Visit: Payer: Self-pay

## 2020-08-15 DIAGNOSIS — N938 Other specified abnormal uterine and vaginal bleeding: Secondary | ICD-10-CM

## 2020-08-15 MED ORDER — ESTRADIOL 1 MG PO TABS: 1.0000 mg | ORAL_TABLET | Freq: Every day | ORAL | 8 refills | Status: DC

## 2020-08-15 MED ORDER — NORETHINDRONE 0.35 MG PO TABS: 1.0000 | ORAL_TABLET | Freq: Every day | ORAL | 9 refills | Status: DC

## 2020-08-15 NOTE — Progress Notes (Signed)
Refill request received from patient's pharmacy. Refills ordered electronically per Kennon Rounds, MD's message, see below:   Donnamae Jude, MD  Annabell Howells, RN  No, she can have a year supply    ----- Message -----  From: Annabell Howells, RN  Sent: 08/14/2020  3:11 PM EDT  To: Donnamae Jude, MD  Subject: birth control refill               Hi Dr. Kennon Rounds,   We received a refill request for norethindrone 0.35 mg daily. It looks like the plan was to take this for a year with the estradiol 1 mg for 14 days each month during bleeding. The patient was given 2 refills with her initial rx on 07/02/20. Did you want her to have any other follow up before we send her a year supply?   Apolonio Schneiders

## 2020-08-17 ENCOUNTER — Ambulatory Visit: Payer: Medicaid Other | Admitting: Obstetrics and Gynecology

## 2020-08-17 NOTE — Progress Notes (Deleted)
CYSTOSCOPY  CC:  This is a 45 y.o. with neurogenic bladder and suburethral vaginal mass who presents today for cystoscopy.  @ENCLABS @  There were no vitals taken for this visit.  CYSTOSCOPY: A time out was performed.  The periurethral area was prepped and draped in a sterile manner.  2% lidocaine jetpack was inserted at the urethral meatus and the urethra and bladder visualized with 12- and 70-degree scopes.  She had {Desc; normal/diminished:12763} urethral coaptation and {Desc; normal/abnormal w/wildcard:19060} urethral mucosa.  She had {Desc; normal/abnormal w/wildcard:19060} bladder mucosa. She had bilateral clear efflux from both ureteral orifices.  She had no squamous metaplasia at the trigone, no trabeculations, cellules or diverticuli.     ASSESSMENT:  45 y.o. with {cysto problems:24783}. Cystoscopy today is {Desc; normal/abnormal w/wildcard:19060}.  PLAN:  Follow-up to discuss findings and treatment options.  All questions answered and post-procedures instructions were given  Jaquita Folds, MD

## 2020-08-23 ENCOUNTER — Encounter: Payer: Self-pay | Admitting: Dermatology

## 2020-08-23 ENCOUNTER — Other Ambulatory Visit: Payer: Self-pay

## 2020-08-23 ENCOUNTER — Ambulatory Visit: Payer: Medicaid Other

## 2020-08-23 ENCOUNTER — Ambulatory Visit: Payer: Medicaid Other | Admitting: Dermatology

## 2020-08-23 DIAGNOSIS — Z85820 Personal history of malignant melanoma of skin: Secondary | ICD-10-CM

## 2020-08-23 DIAGNOSIS — D492 Neoplasm of unspecified behavior of bone, soft tissue, and skin: Secondary | ICD-10-CM

## 2020-08-23 DIAGNOSIS — Z86018 Personal history of other benign neoplasm: Secondary | ICD-10-CM | POA: Diagnosis not present

## 2020-08-23 DIAGNOSIS — L578 Other skin changes due to chronic exposure to nonionizing radiation: Secondary | ICD-10-CM

## 2020-08-23 DIAGNOSIS — L821 Other seborrheic keratosis: Secondary | ICD-10-CM

## 2020-08-23 DIAGNOSIS — Z1283 Encounter for screening for malignant neoplasm of skin: Secondary | ICD-10-CM | POA: Diagnosis not present

## 2020-08-23 DIAGNOSIS — L814 Other melanin hyperpigmentation: Secondary | ICD-10-CM

## 2020-08-23 DIAGNOSIS — L304 Erythema intertrigo: Secondary | ICD-10-CM | POA: Diagnosis not present

## 2020-08-23 DIAGNOSIS — D229 Melanocytic nevi, unspecified: Secondary | ICD-10-CM

## 2020-08-23 DIAGNOSIS — D280 Benign neoplasm of vulva: Secondary | ICD-10-CM

## 2020-08-23 DIAGNOSIS — D1800 Hemangioma unspecified site: Secondary | ICD-10-CM

## 2020-08-23 NOTE — Progress Notes (Signed)
Follow-Up Visit   Subjective  Melissa Pruitt is a 45 y.o. female who presents for the following: recheck Dysplastic Nevi (L proximal lat thigh sup, R mid to upper back 2cm lat to spine, L proximal lat thigh inf, R distal post thigh bx proven 06/11/20), check spot (R groin), and Total body skin exam (Hx of melanoma, Dysplastic nevi).  The patient presents for Total-Body Skin Exam (TBSE) for skin cancer screening and mole check.  The following portions of the chart were reviewed this encounter and updated as appropriate:   Tobacco  Allergies  Meds  Problems  Med Hx  Surg Hx  Fam Hx     Review of Systems:  No other skin or systemic complaints except as noted in HPI or Assessment and Plan.  Objective  Well appearing patient in no apparent distress; mood and affect are within normal limits.  A full examination was performed including scalp, head, eyes, ears, nose, lips, neck, chest, axillae, abdomen, back, buttocks, bilateral upper extremities, bilateral lower extremities, hands, feet, fingers, toes, fingernails, and toenails. All findings within normal limits unless otherwise noted below.  Objective  R mid plantar sole: Well healed scar with no evidence of recurrence, no lymphadenopathy.   Objective  Right labia: Irregular brown macule 0.6cm  L prox lat thigh; L prox lat thigh inferior; R distal post thigh; R mid to upper back 2.0cm lat to spine  Objective  L prox lat thigh: Pink bx site  R mid to upper back 2.0cm lat to spine: Pink bx site, otherwise clear  L prox lat thigh inferior: Pink bx site  R distal post thigh: Pink bx site  Objective  Left Axilla: L axilla clear   Assessment & Plan    Lentigines - Scattered tan macules - Due to sun exposure - Benign-appering, observe - Recommend daily broad spectrum sunscreen SPF 30+ to sun-exposed areas, reapply every 2 hours as needed. - Call for any changes  Seborrheic Keratoses - Stuck-on, waxy, tan-brown  papules and/or plaques  - Benign-appearing - Discussed benign etiology and prognosis. - Observe - Call for any changes  Melanocytic Nevi - Tan-brown and/or pink-flesh-colored symmetric macules and papules - Benign appearing on exam today - Observation - Call clinic for new or changing moles - Recommend daily use of broad spectrum spf 30+ sunscreen to sun-exposed areas.   Hemangiomas - Red papules - Discussed benign nature - Observe - Call for any changes  Actinic Damage - Chronic condition, secondary to cumulative UV/sun exposure - diffuse scaly erythematous macules with underlying dyspigmentation - Recommend daily broad spectrum sunscreen SPF 30+ to sun-exposed areas, reapply every 2 hours as needed.  - Staying in the shade or wearing long sleeves, sun glasses (UVA+UVB protection) and wide brim hats (4-inch brim around the entire circumference of the hat) are also recommended for sun protection.  - Call for new or changing lesions.  Skin cancer screening performed today.  History of melanoma R mid plantar sole Bx proven, BRESLOW'S DEPTH/MAXIMUM TUMOR THICKNESS: 0.5 MM, CLARK/ANATOMIC LEVEL: II Excised 03/20/2020 Clear. Observe for recurrence. Call clinic for new or changing lesions.  Recommend regular skin exams, daily broad-spectrum spf 30+ sunscreen use, and photoprotection.     Neoplasm of skin Right labia Epidermal / dermal shaving  Lesion diameter (cm):  0.6 Informed consent: discussed and consent obtained   Timeout: patient name, date of birth, surgical site, and procedure verified   Procedure prep:  Patient was prepped and draped in usual sterile fashion  Prep type:  Isopropyl alcohol Anesthesia: the lesion was anesthetized in a standard fashion   Anesthetic:  1% lidocaine w/ epinephrine 1-100,000 buffered w/ 8.4% NaHCO3 Instrument used: flexible razor blade   Hemostasis achieved with: pressure, aluminum chloride and electrodesiccation   Outcome: patient tolerated  procedure well   Post-procedure details: sterile dressing applied and wound care instructions given   Dressing type: bandage and petrolatum    Specimen 1 - Surgical pathology Differential Diagnosis: D48.5 Nevus vs Dysplastic Nevus  Check Margins: yes Irregular brown macule 0.6cm  History of dysplastic nevus (4) L prox lat thigh; L prox lat thigh inferior; R distal post thigh; R mid to upper back 2.0cm lat to spine Clear. Observe for recurrence. Call clinic for new or changing lesions.  Recommend regular skin exams, daily broad-spectrum spf 30+ sunscreen use, and photoprotection.    Intertrigo Left Axilla Intertrigo is a chronic recurrent rash that occurs in skin fold areas that may be associated with friction; heat; moisture; yeast; fungus; and bacteria.  It is exacerbated by increased movement / activity; sweating; and higher atmospheric temperature.  Clear with treatment, observe for recurrence  Skin cancer screening  Return in about 3 months (around 11/23/2020) for TBSE, Hx of Melanoma, Hx of Dysplastic nevi.  I, Melissa Pruitt, RMA, am acting as scribe for Sarina Ser, MD .  Documentation: I have reviewed the above documentation for accuracy and completeness, and I agree with the above.  Sarina Ser, MD

## 2020-08-23 NOTE — Patient Instructions (Signed)

## 2020-08-28 ENCOUNTER — Encounter: Payer: Self-pay | Admitting: Dermatology

## 2020-08-29 ENCOUNTER — Telehealth: Payer: Self-pay

## 2020-08-29 NOTE — Telephone Encounter (Signed)
Tried to call patient regarding results - no voicemail/hd

## 2020-08-29 NOTE — Telephone Encounter (Signed)
-----   Message from Ralene Bathe, MD sent at 08/28/2020  6:34 PM EDT ----- Diagnosis Skin , right labia DYSPLASTIC COMPOUND NEVUS WITH MILD ATYPIA, DEEP MARGIN INVOLVED  Dysplastic Mild Recheck next visit

## 2020-08-30 ENCOUNTER — Telehealth: Payer: Self-pay

## 2020-08-30 NOTE — Telephone Encounter (Signed)
-----   Message from David C Kowalski, MD sent at 08/28/2020  6:34 PM EDT ----- Diagnosis Skin , right labia DYSPLASTIC COMPOUND NEVUS WITH MILD ATYPIA, DEEP MARGIN INVOLVED  Dysplastic Mild Recheck next visit 

## 2020-08-30 NOTE — Telephone Encounter (Signed)
Advised patient of results/hd  

## 2020-11-07 NOTE — Progress Notes (Signed)
[facility] Return Visit  SUBJECTIVE  History of Present Illness: Melissa Pruitt is a 45 y.o. female seen in follow-up for neurogenic bladder, recurrent UTI, suburethral/ skene's gland cyst. She had a routine renal US for upper tract imaging in Nov 2021 which showed:  IMPRESSION: 1. Simple appearing cyst like structure along the right aspect of the urinary bladder wall measuring up to 3.3 cm. Differential includes ureterocele, cystitis cystica, or other cyst. Consider further evaluation with direct visualization, as clinically indicated. 2. Unremarkable sonographic appearance of the bilateral kidneys.  Has had 3 courses of amoxicillin recently due to Klebsiella UTIs. She states she had a recent CT A/P to assess her kidneys which was normal (not in [facility] system).  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   Past Surgical History: She  has a past surgical history that includes Back surgery; Bladder surgery; Colostomy; Tubal ligation; removal of cervical cancer; colostomy reversal; Exploratory laparotomy; and LEEP.   Medications: She has a current medication list which includes the following prescription(s): conjugated estrogens, cyanocobalamin, epinephrine, estradiol, fluticasone, fosfomycin, fosfomycin, hydrocortisone, loratadine, methocarbamol, montelukast, mupirocin ointment, and  norethindrone.   Allergies: Patient is allergic to ciprofloxacin, macrobid [nitrofurantoin monohyd macro], morphine and related, azithromycin, dilaudid [hydromorphone hcl], lactose, prednisolone, prednisone, sulfa antibiotics, tramadol hcl, vitamin d analogs, and pork-derived products.   Social History: Patient  reports that she quit smoking about 9 years ago. Her smoking use included cigarettes. She has never used smokeless tobacco. She reports current alcohol use. She reports that she does not use drugs.      OBJECTIVE     Physical Exam: Vitals:   11/09/20 1259  BP: 99/65  Pulse: 60   Gen: No apparent distress, A&O x 3. Suburethral cyst noted. Normal urethral orifice.   Detailed Urogynecologic Evaluation:  Deferred.     CYSTOSCOPY  CC:  This is a 45 y.o. with urethral cystand neurogenic bladder who presents today for cystoscopy.  BP 99/65   Pulse 60   CYSTOSCOPY: A time out was performed.  The periurethral area was prepped and draped in a sterile manner.  2% lidocaine jetpack was inserted at the urethral meatus and the urethra and bladder visualized with 0 and 70-degree scopes.  She had normal urethral coaptation and normal urethral mucosa with no visible connection to the suburethral cyst.  She had normal bladder mucosa. 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 Brisk efflux was also noted from the left ureter.  Squamous metaplasia was noted at the trigone. A bulge was noted on the posterior bladder wall, consistent with cyst on [place] images. There were no trabeculations, cellules or diverticuli.    ASSESSMENT AND PLAN    Melissa Pruitt is a 45 y.o. with:  1. Neurogenic bladder   2. Recurrent urinary tract infection   3. Vaginal mass    Neurogenic bladder - Continue self-catheterization - No concerning findings  on cystoscopy today  AUA NLUTD surveillance- Moderate risk due to DO with incomplete emptying on UDS: - annual renal function assessment - upper tract imaging- q 1-2 yrs - UDS repeat if change in sign/ symptoms - cysto only if rUTIs, hematuria or suspected anomaly  2. Recurrent UTI - discussed obtaining culture only if having symptoms - start Fosfomycin 3g every 10 days for 6 months - Also start D-mannose over the counter for prevention - had been prescribed estrace cream last visit but not using consistently. Use 0.5g twice a week for UTI prevention.   3. Vaginal mass - stable in size - ok to monitor until she desires surgical excision  Return 6 months  Melissa Folds, MD  Time spent: I spent 25 minutes dedicated to the care of this patient on the date of this encounter to include pre-visit review of records, face-to-face time with the patient and post visit documentation. Additional time was spent on the procedure.

## 2020-11-09 ENCOUNTER — Encounter: Payer: Self-pay | Admitting: Obstetrics and Gynecology

## 2020-11-09 ENCOUNTER — Ambulatory Visit (INDEPENDENT_AMBULATORY_CARE_PROVIDER_SITE_OTHER): Payer: Medicaid Other | Admitting: Obstetrics and Gynecology

## 2020-11-09 ENCOUNTER — Other Ambulatory Visit: Payer: Self-pay

## 2020-11-09 VITALS — BP 99/65 | HR 60

## 2020-11-09 DIAGNOSIS — N898 Other specified noninflammatory disorders of vagina: Secondary | ICD-10-CM

## 2020-11-09 DIAGNOSIS — N390 Urinary tract infection, site not specified: Secondary | ICD-10-CM | POA: Diagnosis not present

## 2020-11-09 DIAGNOSIS — N319 Neuromuscular dysfunction of bladder, unspecified: Secondary | ICD-10-CM

## 2020-11-09 MED ORDER — FOSFOMYCIN TROMETHAMINE 3 G PO PACK: 3.0000 g | PACK | ORAL | 5 refills | Status: AC

## 2020-11-09 NOTE — Patient Instructions (Signed)
Obtain D-mannose over the counter for prevention of UTI.   Use vaginal estrogen 0.5g twice a week.   Start fosfomycin once every 10 days for 6 months.

## 2020-11-16 ENCOUNTER — Encounter: Payer: Self-pay | Admitting: Obstetrics and Gynecology

## 2020-11-26 ENCOUNTER — Ambulatory Visit: Payer: Medicaid Other | Admitting: Dermatology

## 2020-11-26 ENCOUNTER — Other Ambulatory Visit: Payer: Self-pay

## 2020-11-26 DIAGNOSIS — Z1283 Encounter for screening for malignant neoplasm of skin: Secondary | ICD-10-CM

## 2020-11-26 DIAGNOSIS — D229 Melanocytic nevi, unspecified: Secondary | ICD-10-CM

## 2020-11-26 DIAGNOSIS — L813 Cafe au lait spots: Secondary | ICD-10-CM | POA: Diagnosis not present

## 2020-11-26 DIAGNOSIS — Z86018 Personal history of other benign neoplasm: Secondary | ICD-10-CM

## 2020-11-26 DIAGNOSIS — D1800 Hemangioma unspecified site: Secondary | ICD-10-CM

## 2020-11-26 DIAGNOSIS — L814 Other melanin hyperpigmentation: Secondary | ICD-10-CM

## 2020-11-26 DIAGNOSIS — L821 Other seborrheic keratosis: Secondary | ICD-10-CM

## 2020-11-26 DIAGNOSIS — L918 Other hypertrophic disorders of the skin: Secondary | ICD-10-CM

## 2020-11-26 DIAGNOSIS — Z85820 Personal history of malignant melanoma of skin: Secondary | ICD-10-CM | POA: Diagnosis not present

## 2020-11-26 DIAGNOSIS — D2271 Melanocytic nevi of right lower limb, including hip: Secondary | ICD-10-CM

## 2020-11-26 DIAGNOSIS — L578 Other skin changes due to chronic exposure to nonionizing radiation: Secondary | ICD-10-CM

## 2020-11-26 NOTE — Patient Instructions (Signed)

## 2020-11-26 NOTE — Progress Notes (Signed)
Follow-Up Visit   Subjective  Melissa Pruitt is a 45 y.o. female who presents for the following: Annual Exam (Hx MM, dysplastic nevi ). The patient presents for Total-Body Skin Exam (TBSE) for skin cancer screening and mole check.  The following portions of the chart were reviewed this encounter and updated as appropriate:   Tobacco  Allergies  Meds  Problems  Med Hx  Surg Hx  Fam Hx     Review of Systems:  No other skin or systemic complaints except as noted in HPI or Assessment and Plan.  Objective  Well appearing patient in no apparent distress; mood and affect are within normal limits.  A full examination was performed including scalp, head, eyes, ears, nose, lips, neck, chest, axillae, abdomen, back, buttocks, bilateral upper extremities, bilateral lower extremities, hands, feet, fingers, toes, fingernails, and toenails. All findings within normal limits unless otherwise noted below.  Assessment & Plan  Cafe au lait spots L med elbow area Benign-appearing.  Observation.  Call clinic for new or changing lesions.  Recommend daily use of broad spectrum spf 30+ sunscreen to sun-exposed areas.   Nevus R foot Benign-appearing.  Observation.  Call clinic for new or changing lesions.  Recommend daily use of broad spectrum spf 30+ sunscreen to sun-exposed areas.   Skin cancer screening  Lentigines - Scattered tan macules - Due to sun exposure - Benign-appering, observe - Recommend daily broad spectrum sunscreen SPF 30+ to sun-exposed areas, reapply every 2 hours as needed. - Call for any changes  Seborrheic Keratoses - Stuck-on, waxy, tan-brown papules and/or plaques  - Benign-appearing - Discussed benign etiology and prognosis. - Observe - Call for any changes  Melanocytic Nevi - Tan-brown and/or pink-flesh-colored symmetric macules and papules - Benign appearing on exam today - Observation - Call clinic for new or changing moles - Recommend daily use of broad  spectrum spf 30+ sunscreen to sun-exposed areas.   Hemangiomas - Red papules - Discussed benign nature - Observe - Call for any changes  Actinic Damage - Chronic condition, secondary to cumulative UV/sun exposure - diffuse scaly erythematous macules with underlying dyspigmentation - Recommend daily broad spectrum sunscreen SPF 30+ to sun-exposed areas, reapply every 2 hours as needed.  - Staying in the shade or wearing long sleeves, sun glasses (UVA+UVB protection) and wide brim hats (4-inch brim around the entire circumference of the hat) are also recommended for sun protection.  - Call for new or changing lesions.  History of Melanoma - No evidence of recurrence today - No lymphadenopathy - Recommend regular full body skin exams - Recommend daily broad spectrum sunscreen SPF 30+ to sun-exposed areas, reapply every 2 hours as needed.  - Call if any new or changing lesions are noted between office visits  History of Dysplastic Nevi - No evidence of recurrence today - Recommend regular full body skin exams - Recommend daily broad spectrum sunscreen SPF 30+ to sun-exposed areas, reapply every 2 hours as needed.  - Call if any new or changing lesions are noted between office visits  Acrochordons (Skin Tags) - Fleshy, skin-colored pedunculated papules - Benign appearing.  - Observe. - If desired, they can be removed with an in office procedure that is not covered by insurance. - Please call the clinic if you notice any new or changing lesions.  Skin cancer screening performed today.  Return in about 3 months (around 02/26/2021) for TBSE - hx MM, dysplastic nevi .  Luther Redo, CMA, am acting as  scribe for Sarina Ser, MD . Documentation: I have reviewed the above documentation for accuracy and completeness, and I agree with the above.  Sarina Ser, MD

## 2020-11-28 ENCOUNTER — Encounter: Payer: Self-pay | Admitting: Dermatology

## 2020-12-24 ENCOUNTER — Ambulatory Visit
Admission: RE | Admit: 2020-12-24 | Discharge: 2020-12-24 | Disposition: A | Discharge status: Home / Self Care | Payer: Medicaid Other | Source: Ambulatory Visit | Attending: Family Medicine | Admitting: Family Medicine

## 2020-12-24 ENCOUNTER — Other Ambulatory Visit: Payer: Self-pay

## 2020-12-24 DIAGNOSIS — Z1231 Encounter for screening mammogram for malignant neoplasm of breast: Secondary | ICD-10-CM

## 2021-01-14 ENCOUNTER — Encounter: Admission: RE | Payer: Self-pay | Source: Home / Self Care

## 2021-01-14 ENCOUNTER — Ambulatory Visit: Admission: RE | Admit: 2021-01-14 | Payer: Medicaid Other | Source: Home / Self Care

## 2021-01-14 SURGERY — COLONOSCOPY WITH PROPOFOL
Anesthesia: General

## 2021-01-28 ENCOUNTER — Ambulatory Visit: Payer: Medicaid Other | Admitting: Dermatology

## 2021-03-20 ENCOUNTER — Other Ambulatory Visit: Payer: Self-pay

## 2021-03-20 ENCOUNTER — Ambulatory Visit: Payer: Medicaid Other | Admitting: Dermatology

## 2021-03-20 DIAGNOSIS — D485 Neoplasm of uncertain behavior of skin: Secondary | ICD-10-CM | POA: Diagnosis not present

## 2021-03-20 DIAGNOSIS — L578 Other skin changes due to chronic exposure to nonionizing radiation: Secondary | ICD-10-CM | POA: Diagnosis not present

## 2021-03-20 DIAGNOSIS — L821 Other seborrheic keratosis: Secondary | ICD-10-CM

## 2021-03-20 DIAGNOSIS — Z1283 Encounter for screening for malignant neoplasm of skin: Secondary | ICD-10-CM

## 2021-03-20 DIAGNOSIS — D1800 Hemangioma unspecified site: Secondary | ICD-10-CM

## 2021-03-20 DIAGNOSIS — L820 Inflamed seborrheic keratosis: Secondary | ICD-10-CM | POA: Diagnosis not present

## 2021-03-20 DIAGNOSIS — L814 Other melanin hyperpigmentation: Secondary | ICD-10-CM

## 2021-03-20 DIAGNOSIS — Z85820 Personal history of malignant melanoma of skin: Secondary | ICD-10-CM | POA: Diagnosis not present

## 2021-03-20 DIAGNOSIS — Z86018 Personal history of other benign neoplasm: Secondary | ICD-10-CM

## 2021-03-20 DIAGNOSIS — D229 Melanocytic nevi, unspecified: Secondary | ICD-10-CM

## 2021-03-20 MED ORDER — MUPIROCIN 2 % EX OINT: TOPICAL_OINTMENT | CUTANEOUS | 1 refills | Status: DC

## 2021-03-20 NOTE — Patient Instructions (Addendum)
Serica Moisturizing Scar Formula for scar on the right foot    If You Need Anything After Your Visit  If you have any questions or concerns for your doctor, please call our main line at [phone] and press option 4 to reach your doctor's medical assistant. If no one answers, please leave a voicemail as directed and we will return your call as soon as possible. Messages left after 4 pm will be answered the following business day.   You may also send [place] a message via [place]. We typically respond to MyChart messages within 1-2 business days.  For prescription refills, please ask your pharmacy to contact our office. Our fax number is [phone].  If you have an urgent issue when the clinic is closed that cannot wait until the next business day, you can page your doctor at the number below.    Please note that while we do our best to be available for urgent issues outside of office hours, we are not available 24/7.   If you have an urgent issue and are unable to reach [place], you may choose to seek medical care at your doctor's office, retail clinic, urgent care center, or emergency room.  If you have a medical emergency, please immediately call 911 or go to the emergency department.  Pager Numbers  - Dr. Nehemiah Massed: [phone]  - Dr. Laurence Ferrari: [phone]  - Dr. Nicole Kindred: [phone]  In the event of inclement weather, please call our main line at [phone] for an update on the status of any delays or closures.  Dermatology Medication Tips: Please keep the boxes that topical medications come in in order to help keep track of the instructions about where and how to use these. Pharmacies typically print the medication instructions only on the boxes and not directly on the medication tubes.   If your medication is too expensive, please contact our office at [phone] option 4 or send [place] a message through [place].   We are unable to tell what your co-pay for medications will be in  advance as this is different depending on your insurance coverage. However, we may be able to find a substitute medication at lower cost or fill out paperwork to get insurance to cover a needed medication.   If a prior authorization is required to get your medication covered by your insurance company, please allow [place] 1-2 business days to complete this process.  Drug prices often vary depending on where the prescription is filled and some pharmacies may offer cheaper prices.  The website www.goodrx.com contains coupons for medications through different pharmacies. The prices here do not account for what the cost may be with help from insurance (it may be cheaper with your insurance), but the website can give you the price if you did not use any insurance.  - You can print the associated coupon and take it with your prescription to the pharmacy.  - You may also stop by our office during regular business hours and pick up a GoodRx coupon card.  - If you need your prescription sent electronically to a different pharmacy, notify our office through [facility] or by phone at [phone] option 4.     Si Usted Necesita Algo Despus de Su Visita  Tambin puede enviarnos un mensaje a travs de Pharmacist, community. Por lo general respondemos a los mensajes de MyChart en el transcurso de 1 a 2 das hbiles.  Para renovar recetas, por favor pida a su farmacia que se ponga en contacto  con nuestra oficina. Harland Dingwall de fax es [place] [phone].  Si tiene un asunto urgente cuando la clnica est cerrada y que no puede esperar hasta el siguiente da hbil, puede llamar/localizar a su doctor(a) al nmero que aparece a continuacin.   Por favor, tenga en cuenta que aunque hacemos todo lo posible para estar disponibles para asuntos urgentes fuera del horario de [place], no estamos disponibles las 24 horas del da, los 7 das de la [place].   Si tiene un problema urgente y no puede comunicarse con nosotros, puede  optar por buscar atencin mdica  en el consultorio de su doctor(a), en una clnica privada, en un centro de atencin urgente o en una sala de emergencias.  Si tiene Engineering geologist, por favor llame inmediatamente al 911 o vaya a la sala de emergencias.  Nmeros de bper  - Dr. Nehemiah Massed: [phone]  - Dra. Moye: [phone]  - Dra. Nicole Kindred: [phone]  En caso de inclemencias del [place], por favor llame a Johnsie Kindred principal al [phone] para una actualizacin sobre el [place] de cualquier retraso o cierre.  Consejos para la medicacin en dermatologa: Por favor, guarde las cajas en las que vienen los medicamentos de uso tpico para ayudarle a seguir las instrucciones sobre dnde y cmo usarlos. Las farmacias generalmente imprimen las instrucciones del medicamento slo en las cajas y no directamente en los tubos del [place].   Si su medicamento es muy caro, por favor, pngase en contacto con Zigmund Daniel llamando al [phone] y presione la opcin 4 o envenos un mensaje a travs de Pharmacist, community.   No podemos decirle cul ser su copago por los medicamentos por adelantado ya que esto es diferente dependiendo de la cobertura de su seguro. Sin embargo, es posible que podamos encontrar un medicamento sustituto a Electrical engineer un formulario para que el seguro cubra el medicamento que se considera necesario.   Si se requiere una autorizacin previa para que su compaa de seguros [place] su medicamento, por favor permtanos de 1 a 2 das hbiles para completar este proceso.  Los precios de los medicamentos varan con frecuencia dependiendo del Environmental consultant de dnde se surte la receta y alguna farmacias pueden ofrecer precios ms baratos.  El sitio web www.goodrx.com tiene cupones para medicamentos de Airline pilot. Los precios aqu no tienen en cuenta lo que podra costar con la ayuda del seguro (puede ser ms barato con su seguro), pero el sitio web puede darle el  precio si no utiliz Research scientist (physical sciences).  - Puede imprimir el cupn correspondiente y llevarlo con su receta a la farmacia.  - Tambin puede pasar por nuestra oficina durante el horario de atencin regular y Charity fundraiser una tarjeta de cupones de GoodRx.  - Si necesita que su receta se enve electrnicamente a una farmacia diferente, informe a nuestra oficina a travs de MyChart de [place] o por telfono llamando al [phone] y presione la opcin 4.

## 2021-03-20 NOTE — Progress Notes (Signed)
Follow-Up Visit   Subjective  Melissa Pruitt is a 45 y.o. female who presents for the following: Annual Exam (Mole check ). Hx of Melanoma The patient presents for Total-Body Skin Exam (TBSE) for skin cancer screening and mole check.  The patient has spots, moles and lesions to be evaluated, some may be new or changing and the patient has concerns that these could be cancer.   The following portions of the chart were reviewed this encounter and updated as appropriate:   Tobacco  Allergies  Meds  Problems  Med Hx  Surg Hx  Fam Hx     Review of Systems:  No other skin or systemic complaints except as noted in HPI or Assessment and Plan.  Objective  Well appearing patient in no apparent distress; mood and affect are within normal limits.  A full examination was performed including scalp, head, eyes, ears, nose, lips, neck, chest, axillae, abdomen, back, buttocks, bilateral upper extremities, bilateral lower extremities, hands, feet, fingers, toes, fingernails, and toenails. All findings within normal limits unless otherwise noted below.  left neck x 1 Erythematous keratotic or waxy stuck-on papule or plaque.    Assessment & Plan  Inflamed seborrheic keratosis left neck x 1 Reassured benign age-related growth.  Recommend observation.  Discussed cryotherapy if spot(s) become irritated or inflamed.   Prior to procedure, discussed risks of blister formation, small wound, skin dyspigmentation, or rare scar following cryotherapy. Recommend Vaseline ointment to treated areas while healing.   Destruction of lesion - left neck x 1 Complexity: simple   Destruction method: cryotherapy   Informed consent: discussed and consent obtained   Timeout:  patient name, date of birth, surgical site, and procedure verified Lesion destroyed using liquid nitrogen: Yes   Region frozen until ice ball extended beyond lesion: Yes   Outcome: patient tolerated procedure well with no complications    Post-procedure details: wound care instructions given    Related Medications mupirocin ointment (BACTROBAN) 2 % Apply to skin qd-bid  Skin cancer screening  Lentigines - Scattered tan macules - Due to sun exposure - Benign-appearing, observe - Recommend daily broad spectrum sunscreen SPF 30+ to sun-exposed areas, reapply every 2 hours as needed. - Call for any changes  Seborrheic Keratoses - Stuck-on, waxy, tan-brown papules and/or plaques  - Benign-appearing - Discussed benign etiology and prognosis. - Observe - Call for any changes  Melanocytic Nevi - Tan-brown and/or pink-flesh-colored symmetric macules and papules - Benign appearing on exam today - Observation - Call clinic for new or changing moles - Recommend daily use of broad spectrum spf 30+ sunscreen to sun-exposed areas.   Hemangiomas - Red papules - Discussed benign nature - Observe - Call for any changes  Actinic Damage - Chronic condition, secondary to cumulative UV/sun exposure - diffuse scaly erythematous macules with underlying dyspigmentation - Recommend daily broad spectrum sunscreen SPF 30+ to sun-exposed areas, reapply every 2 hours as needed.  - Staying in the shade or wearing long sleeves, sun glasses (UVA+UVB protection) and wide brim hats (4-inch brim around the entire circumference of the hat) are also recommended for sun protection.  - Call for new or changing lesions.  History of Dysplastic Nevi Multiple see history  - No evidence of recurrence today - Recommend regular full body skin exams - Recommend daily broad spectrum sunscreen SPF 30+ to sun-exposed areas, reapply every 2 hours as needed.  - Call if any new or changing lesions are noted between office visits   History  of Melanoma Well healed scar  Right mid plantar sole 02/16/2020 Recommend Serica Moisturizing Scar Formula, may consider topical steroid treatment or ILK in future  - No evidence of recurrence today - No  lymphadenopathy - Recommend regular full body skin exams - Recommend daily broad spectrum sunscreen SPF 30+ to sun-exposed areas, reapply every 2 hours as needed.  - Call if any new or changing lesions are noted between office visits   Skin cancer screening performed today.   Return in about 4 months (around 07/19/2021) for TBSE, hx of Melanoma.  IMarye Round, CMA, am acting as scribe for Sarina Ser, MD .  Documentation: I have reviewed the above documentation for accuracy and completeness, and I agree with the above.  Sarina Ser, MD

## 2021-03-29 ENCOUNTER — Encounter: Payer: Self-pay | Admitting: Dermatology

## 2021-05-04 ENCOUNTER — Other Ambulatory Visit: Payer: Self-pay | Admitting: Family Medicine

## 2021-05-04 DIAGNOSIS — N938 Other specified abnormal uterine and vaginal bleeding: Secondary | ICD-10-CM

## 2021-05-14 ENCOUNTER — Telehealth (INDEPENDENT_AMBULATORY_CARE_PROVIDER_SITE_OTHER): Payer: Medicaid Other | Admitting: Obstetrics and Gynecology

## 2021-05-14 ENCOUNTER — Other Ambulatory Visit: Payer: Self-pay

## 2021-05-14 DIAGNOSIS — N390 Urinary tract infection, site not specified: Secondary | ICD-10-CM

## 2021-05-14 DIAGNOSIS — N319 Neuromuscular dysfunction of bladder, unspecified: Secondary | ICD-10-CM

## 2021-05-14 MED ORDER — FOSFOMYCIN TROMETHAMINE 3 G PO PACK: PACK | ORAL | 4 refills | Status: DC

## 2021-05-14 NOTE — Progress Notes (Signed)
[facility] Urogynecology Return Visit- Video  This was a video visit. The patient was at home in [facility] and I was in the Urogynecology office. Only the patient was present on the call and she consented to video visit.   SUBJECTIVE  History of Present Illness: SHAIANNE NUCCI is a 46 y.o. female seen in follow-up for neurogenic bladder, recurrent UTI and vaginal cyst.   Currently has a UTI with E.Coli. She is having symptoms. She was prescribed with Augmentin but has not taken it yet because she has been on augmentin several times over the last few months for infections. Only did one course of the fosfomycin and has not been taking it prophylactically.   Recently reports that she had blood work which showed her renal function tests were good (not visible through epic).   Vaginal cyst has not been that bothersome to her lately. She is interested in having it removed eventually.   History of myelomeningocele and duplicated right collecting system and self-catheterizes for neurogenic bladder.  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   Past Surgical History: She  has a past surgical history that includes Back surgery; Bladder surgery; Colostomy; Tubal ligation; removal of cervical cancer; colostomy reversal; Exploratory laparotomy; and LEEP.   Medications: She has a current medication list which includes the  following prescription(s): conjugated estrogens, cyanocobalamin, epinephrine, estradiol, fluticasone, fosfomycin, hydrocortisone, loratadine, methocarbamol, montelukast, mupirocin ointment, mupirocin ointment, and norethindrone.   Allergies: Patient is allergic to ciprofloxacin, macrobid [nitrofurantoin monohyd macro], morphine and related, azithromycin, dilaudid [hydromorphone hcl], lactose, prednisolone, prednisone, sulfa antibiotics, tramadol hcl, vitamin d analogs, and pork-derived products.   Social History: Patient  reports that she quit smoking about 9 years ago. Her smoking use included cigarettes. She has never used smokeless tobacco. She reports current alcohol use. She reports that she does not use drugs.      OBJECTIVE     Physical Exam:  Gen: No apparent distress, A&O x 3.     ASSESSMENT AND PLAN    Ms. Winstanley is a 46 y.o. with:  1. Recurrent UTI   2. Neurogenic bladder    - We discussed using Fosfomycin as a prophylactic, taking 3g every 10 days for 6 months. New prescription written for this.  - She does not need test of cure after positive urine cultures. We discussed that she should only be testing for infection when she has symptoms of a UTI.  - Plan for repeat renal US in the fall to assess upper tracts (every 2 years)  Can follow up 6 months- wants to discuss possible surgery for vaginal cyst at that time.   Jaquita Folds, MD  Time spent: I spent 20 minutes dedicated to the care of this patient on the date of this encounter to include pre-visit review of records, face-to-face time with the patient and post visit documentation.

## 2021-05-14 NOTE — Patient Instructions (Signed)
Start Fosfomycin every 10 days for 6 months to prevent infections. Only obtain urine cultures if you are having symptoms. Do not need to get a culture after treatment.

## 2021-07-24 ENCOUNTER — Ambulatory Visit: Payer: Medicaid Other | Admitting: Dermatology

## 2021-08-12 ENCOUNTER — Ambulatory Visit: Payer: Medicaid Other | Admitting: Dermatology

## 2021-08-12 DIAGNOSIS — L814 Other melanin hyperpigmentation: Secondary | ICD-10-CM

## 2021-08-12 DIAGNOSIS — Z1283 Encounter for screening for malignant neoplasm of skin: Secondary | ICD-10-CM

## 2021-08-12 DIAGNOSIS — Z85820 Personal history of malignant melanoma of skin: Secondary | ICD-10-CM

## 2021-08-12 DIAGNOSIS — L821 Other seborrheic keratosis: Secondary | ICD-10-CM

## 2021-08-12 DIAGNOSIS — D224 Melanocytic nevi of scalp and neck: Secondary | ICD-10-CM | POA: Diagnosis not present

## 2021-08-12 DIAGNOSIS — Z86018 Personal history of other benign neoplasm: Secondary | ICD-10-CM

## 2021-08-12 DIAGNOSIS — L578 Other skin changes due to chronic exposure to nonionizing radiation: Secondary | ICD-10-CM

## 2021-08-12 DIAGNOSIS — D229 Melanocytic nevi, unspecified: Secondary | ICD-10-CM

## 2021-08-12 DIAGNOSIS — D492 Neoplasm of unspecified behavior of bone, soft tissue, and skin: Secondary | ICD-10-CM

## 2021-08-12 DIAGNOSIS — D1800 Hemangioma unspecified site: Secondary | ICD-10-CM

## 2021-08-12 NOTE — Progress Notes (Signed)
? ?Follow-Up Visit ?  ?Subjective  ?Melissa Pruitt is a 46 y.o. female who presents for the following: Annual Exam (The patient presents for Total-Body Skin Exam (TBSE) for skin cancer screening and mole check.  The patient has spots, moles and lesions to be evaluated, some may be new or changing and the patient has concerns that these could be cancer. ). Hx of Melanoma Right plantar sole 02/16/2020.  ? ?The following portions of the chart were reviewed this encounter and updated as appropriate:  ? Tobacco  Allergies  Meds  Problems  Med Hx  Surg Hx  Fam Hx   ?  ?Review of Systems:  No other skin or systemic complaints except as noted in HPI or Assessment and Plan. ? ?Objective  ?Well appearing patient in no apparent distress; mood and affect are within normal limits. ? ?A full examination was performed including scalp, head, eyes, ears, nose, lips, neck, chest, axillae, abdomen, back, buttocks, bilateral upper extremities, bilateral lower extremities, hands, feet, fingers, toes, fingernails, and toenails. All findings within normal limits unless otherwise noted below. ? ?right lateral neck post ?0.6 cm Irregular brown macule  ? ? ? ? ?right lateral neck anterior ?1.1 cm irregular brown macule  ? ? ? ? ? ?Assessment & Plan  ? ?Lentigines ?- Scattered tan macules ?- Due to sun exposure ?- Benign-appearing, observe ?- Recommend daily broad spectrum sunscreen SPF 30+ to sun-exposed areas, reapply every 2 hours as needed. ?- Call for any changes ? ?Seborrheic Keratoses ?- Stuck-on, waxy, tan-brown papules and/or plaques  ?- Benign-appearing ?- Discussed benign etiology and prognosis. ?- Observe ?- Call for any changes ? ?Melanocytic Nevi ?- Tan-brown and/or pink-flesh-colored symmetric macules and papules ?- Benign appearing on exam today ?- Observation ?- Call clinic for new or changing moles ?- Recommend daily use of broad spectrum spf 30+ sunscreen to sun-exposed areas.  ? ?Hemangiomas ?- Red papules ?-  Discussed benign nature ?- Observe ?- Call for any changes ? ?Actinic Damage ?- Chronic condition, secondary to cumulative UV/sun exposure ?- diffuse scaly erythematous macules with underlying dyspigmentation ?- Recommend daily broad spectrum sunscreen SPF 30+ to sun-exposed areas, reapply every 2 hours as needed.  ?- Staying in the shade or wearing long sleeves, sun glasses (UVA+UVB protection) and wide brim hats (4-inch brim around the entire circumference of the hat) are also recommended for sun protection.  ?- Call for new or changing lesions. ? ?History of Dysplastic Nevi ?- No evidence of recurrence today ?- Recommend regular full body skin exams ?- Recommend daily broad spectrum sunscreen SPF 30+ to sun-exposed areas, reapply every 2 hours as needed.  ?- Call if any new or changing lesions are noted between office visits  ? ?History of Melanoma ?[place] level II ?Right plantar sole 02/16/2020 ?- No evidence of recurrence today ?- No lymphadenopathy ?- Recommend regular full body skin exams ?- Recommend daily broad spectrum sunscreen SPF 30+ to sun-exposed areas, reapply every 2 hours as needed.  ?- Call if any new or changing lesions are noted between office visits  ? ?Skin cancer screening performed today.  ? ?Neoplasm of skin (2) ?right lateral neck post ?Epidermal / dermal shaving ? ?Lesion diameter (cm):  0.6 ?Informed consent: discussed and consent obtained   ?Timeout: patient name, date of birth, surgical site, and procedure verified   ?Procedure prep:  Patient was prepped and draped in usual sterile fashion ?Prep type:  Isopropyl alcohol ?Anesthesia: the lesion was anesthetized in a standard fashion   ?  Anesthetic:  1% lidocaine w/ epinephrine 1-100,000 buffered w/ 8.4% NaHCO3 ?Hemostasis achieved with: pressure, aluminum chloride and electrodesiccation   ?Outcome: patient tolerated procedure well   ?Post-procedure details: sterile dressing applied and wound care instructions given   ?Dressing type:  bandage and petrolatum   ? ?Specimen 1 - Surgical pathology ?Differential Diagnosis: R/O Dysplastic nevus  ?Check Margins: No ? ?right lateral neck anterior ?Epidermal / dermal shaving ? ?Lesion diameter (cm):  1.1 ?Informed consent: discussed and consent obtained   ?Timeout: patient name, date of birth, surgical site, and procedure verified   ?Procedure prep:  Patient was prepped and draped in usual sterile fashion ?Prep type:  Isopropyl alcohol ?Anesthesia: the lesion was anesthetized in a standard fashion   ?Anesthetic:  1% lidocaine w/ epinephrine 1-100,000 buffered w/ 8.4% NaHCO3 ?Hemostasis achieved with: pressure, aluminum chloride and electrodesiccation   ?Outcome: patient tolerated procedure well   ?Post-procedure details: sterile dressing applied and wound care instructions given   ?Dressing type: bandage and petrolatum   ? ?Specimen 2 - Surgical pathology ?Differential Diagnosis: R/O Dysplastic nevus  ?Check Margins: No ? ?Return in about 4 months (around 12/13/2021). ? ?I, Marye Round, CMA, am acting as scribe for Sarina Ser, MD .  ?Documentation: I have reviewed the above documentation for accuracy and completeness, and I agree with the above. ? ?Sarina Ser, MD ? ?

## 2021-08-12 NOTE — Patient Instructions (Addendum)

## 2021-08-14 ENCOUNTER — Telehealth: Payer: Self-pay

## 2021-08-14 NOTE — Telephone Encounter (Signed)
Tried calling pt about pathology results and no answer/voicemail/sh ?

## 2021-08-14 NOTE — Telephone Encounter (Signed)
-----   Message from Ralene Bathe, MD sent at 08/13/2021  8:46 PM EDT ----- ?Diagnosis ?1. Skin , right lateral neck post ?DYSPLASTIC JUNCTIONAL NEVUS WITH MODERATE ATYPIA, CLOSE TO MARGIN ?2. Skin , right lateral neck anterior ?MELANOCYTIC NEVUS, INTRADERMAL TYPE, BASE INVOLVED ? ?1- moderate dysplastic ?Recheck next visit ?2- benign mole ?No further treatment needed. ?

## 2021-08-19 ENCOUNTER — Encounter (HOSPITAL_COMMUNITY): Payer: Self-pay | Admitting: Emergency Medicine

## 2021-08-19 ENCOUNTER — Emergency Department (HOSPITAL_COMMUNITY): Payer: Medicaid Other

## 2021-08-19 ENCOUNTER — Emergency Department (HOSPITAL_COMMUNITY)
Admission: EM | Admit: 2021-08-19 | Discharge: 2021-08-19 | Disposition: A | Discharge status: Home / Self Care | Payer: Medicaid Other | Attending: Emergency Medicine | Admitting: Emergency Medicine

## 2021-08-19 ENCOUNTER — Other Ambulatory Visit: Payer: Self-pay

## 2021-08-19 DIAGNOSIS — R531 Weakness: Secondary | ICD-10-CM | POA: Diagnosis present

## 2021-08-19 DIAGNOSIS — R002 Palpitations: Secondary | ICD-10-CM | POA: Diagnosis not present

## 2021-08-19 DIAGNOSIS — R200 Anesthesia of skin: Secondary | ICD-10-CM | POA: Insufficient documentation

## 2021-08-19 DIAGNOSIS — R519 Headache, unspecified: Secondary | ICD-10-CM | POA: Insufficient documentation

## 2021-08-19 DIAGNOSIS — R5383 Other fatigue: Secondary | ICD-10-CM | POA: Diagnosis not present

## 2021-08-19 DIAGNOSIS — R202 Paresthesia of skin: Secondary | ICD-10-CM

## 2021-08-19 LAB — CBC WITH DIFFERENTIAL/PLATELET
Abs Immature Granulocytes: 0.02 10*3/uL (ref 0.00–0.07)
Basophils Absolute: 0.1 10*3/uL (ref 0.0–0.1)
Basophils Relative: 1 %
Eosinophils Absolute: 0 10*3/uL (ref 0.0–0.5)
Eosinophils Relative: 0 %
HCT: 39.4 % (ref 36.0–46.0)
Hemoglobin: 13 g/dL (ref 12.0–15.0)
Immature Granulocytes: 0 %
Lymphocytes Relative: 27 %
Lymphs Abs: 1.9 10*3/uL (ref 0.7–4.0)
MCH: 31 pg (ref 26.0–34.0)
MCHC: 33 g/dL (ref 30.0–36.0)
MCV: 94 fL (ref 80.0–100.0)
Monocytes Absolute: 0.4 10*3/uL (ref 0.1–1.0)
Monocytes Relative: 6 %
Neutro Abs: 4.7 10*3/uL (ref 1.7–7.7)
Neutrophils Relative %: 66 %
Platelets: 225 10*3/uL (ref 150–400)
RBC: 4.19 MIL/uL (ref 3.87–5.11)
RDW: 13.4 % (ref 11.5–15.5)
WBC: 7.1 10*3/uL (ref 4.0–10.5)
nRBC: 0 % (ref 0.0–0.2)

## 2021-08-19 LAB — BASIC METABOLIC PANEL
Anion gap: 7 (ref 5–15)
BUN: 6 mg/dL (ref 6–20)
CO2: 26 mmol/L (ref 22–32)
Calcium: 9 mg/dL (ref 8.9–10.3)
Chloride: 106 mmol/L (ref 98–111)
Creatinine, Ser: 0.67 mg/dL (ref 0.44–1.00)
GFR, Estimated: >60 mL/min (ref >60)
Glucose, Bld: 114 mg/dL — ABNORMAL HIGH (ref 70–99)
Potassium: 3.7 mmol/L (ref 3.5–5.1)
Sodium: 139 mmol/L (ref 135–145)

## 2021-08-19 LAB — TROPONIN I (HIGH SENSITIVITY)
Troponin I (High Sensitivity): <2 ng/L (ref <18)
Troponin I (High Sensitivity): <2 ng/L (ref <18)

## 2021-08-19 LAB — I-STAT BETA HCG BLOOD, ED (MC, WL, AP ONLY): I-stat hCG, quantitative: <5 m[IU]/mL (ref <5)

## 2021-08-19 MED ORDER — GADOBUTROL 1 MMOL/ML IV SOLN
5.5000 mL | Freq: Once | INTRAVENOUS | Status: AC | PRN
  Administered 2021-08-19: 5.5 mL via INTRAVENOUS

## 2021-08-19 NOTE — ED Notes (Signed)
Ambulates to restroom at this time 

## 2021-08-19 NOTE — ED Provider Notes (Addendum)
[facility] EMERGENCY DEPARTMENT Provider Note   CSN: [id number] Arrival date & time: 08/19/21  1214     History  Chief Complaint  Patient presents with   Palpitations    Melissa Pruitt is a 46 y.o. female.   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  The patient states that she had two moles removed from her neck last Monday. She had localized lidocaine injected.  She was not intubated or under significant deep sedation at the time.  She felt numb down her right arm after waking up from the procedure. She woke up the following day with persistent symptoms. Transient episodes of right eye vision loss three times in total since the episode. She has had a dull headache. Endorses heart palpitations with an associated pounding in her chest. Currently she denies any vision loss. Was having blurry vision up until last Saturday. Has been very fatigued. Se still endorses numbness down her right arm. Feels like she has a pulled muscle in her right neck and above her right clavicle.   Home Medications Prior to Admission medications   Medication Sig Start Date End Date Taking? Authorizing Provider  conjugated estrogens (PREMARIN) vaginal cream Place 1 Applicatorful vaginally 2 (two) times a week. Place 0.5g nightly for two weeks then twice a week after 03/01/20   Marguerita Beards, MD  Cyanocobalamin (VITAMIN B 12 PO) Take by mouth.    [provider]  EPINEPHrine 0.3 mg/0.3 mL IJ SOAJ injection Inject into the muscle once.    [provider]  estradiol (ESTRACE) 1 MG tablet Take 1 tablet (1 mg total) by mouth daily. Take for 2 weeks each month for bleeding 08/15/20 08/15/21  Reva Bores, MD  fluticasone [facility]) 50 MCG/ACT nasal  spray Place 2 sprays into the nose daily.    [provider]  fosfomycin (MONUROL) 3 g PACK Take one packet dissolved in water every 10 days 05/14/21   Marguerita Beards, MD  hydrocortisone 2.5 % cream Apply topically at bedtime. Apply to under left arm on Monday, Wednesday and Friday. 06/11/20   Deirdre Evener, MD  loratadine (CLARITIN) 10 MG tablet Take 10 mg by mouth daily.    [provider]  methocarbamol (ROBAXIN) 750 MG tablet Take 750 mg by mouth 3 (three) times daily.    [provider]  montelukast (SINGULAIR) 10 MG tablet Take 10 mg by mouth at bedtime.    [provider]  mupirocin ointment (BACTROBAN) 2 % Apply 1 application topically daily. With dressing changes 04/25/20   Deirdre Evener, MD  mupirocin ointment Idelle Jo) 2 % Apply to skin qd-bid 03/20/21   Deirdre Evener, MD  norethindrone (MICRONOR) 0.35 MG tablet TAKE 1 TABLET BY MOUTH EVERY DAY 05/04/21   Reva Bores, MD      Allergies    Ciprofloxacin, Macrobid [nitrofurantoin monohyd macro], Morphine and related, Azithromycin, Dilaudid [hydromorphone hcl], Lactose, Prednisolone, Prednisone, Sulfa antibiotics, Tramadol hcl, Vitamin d analogs, and Pork-derived products    Review of Systems   Review of Systems  Eyes:  Positive for visual disturbance.  Cardiovascular:  Positive for palpitations.  Neurological:  Positive for weakness and numbness.   Physical Exam Updated Vital Signs BP 101/64   Pulse (!) 53  Temp 98.3 F (36.8 C) (Oral)   Resp (!) 22   Ht 5\' 4"  (1.626 m)   Wt 54.4 kg   LMP 08/19/2021 (Within Days)   SpO2 100%   BMI 20.60 kg/m  Physical Exam Vitals and nursing note reviewed.  Constitutional:      General: She is not in acute distress.    Appearance: She is well-developed.  HENT:     Head: Normocephalic and atraumatic.  Eyes:     General: Vision grossly intact. Gaze aligned appropriately. No visual field deficit.    Extraocular Movements:  Extraocular movements intact.     Conjunctiva/sclera: Conjunctivae normal.     Pupils: Pupils are equal, round, and reactive to light.     Comments: No visual field deficit  Cardiovascular:     Rate and Rhythm: Normal rate and regular rhythm.     Heart sounds: No murmur heard. Pulmonary:     Effort: Pulmonary effort is normal. No respiratory distress.     Breath sounds: Normal breath sounds.  Abdominal:     General: There is no distension.     Palpations: Abdomen is soft.     Tenderness: There is no abdominal tenderness. There is no guarding.  Musculoskeletal:        General: No swelling, deformity or signs of injury.     Cervical back: Neck supple.  Skin:    General: Skin is warm and dry.     Capillary Refill: Capillary refill takes less than 2 seconds.     Findings: No lesion or rash.  Neurological:     Mental Status: She is alert.     Comments: MENTAL STATUS EXAM:    Orientation: Alert and oriented to person, place and time.  Memory: Cooperative, follows commands well.  Language: Speech is clear and language is normal.   CRANIAL NERVES:    CN 2 (Optic): Visual fields intact to confrontation.  CN 3,4,6 (EOM): Pupils equal and reactive to light. Full extraocular eye movement without nystagmus.  CN 5 (Trigeminal): Facial sensation is normal, no weakness of masticatory muscles.  CN 7 (Facial): No facial weakness or asymmetry.  CN 8 (Auditory): Auditory acuity grossly normal.  CN 9,10 (Glossophar): The uvula is midline, the palate elevates symmetrically.  CN 11 (spinal access): Normal sternocleidomastoid and trapezius strength.  CN 12 (Hypoglossal): The tongue is midline. No atrophy or fasciculations.Marland Kitchen   MOTOR:  Muscle Strength: 4/5RUE, 5/5LUE, 3/5RLE, 5/5LLE.   COORDINATION:   Intact finger-to-nose, no tremor, no pronator drift..   SENSATION:   Intact to light touch all four extremities, diminished sensation in the right upper and lower extremities.     Psychiatric:         Mood and Affect: Mood normal.    ED Results / Procedures / Treatments   Labs (all labs ordered are listed, but only abnormal results are displayed) Labs Reviewed  BASIC METABOLIC PANEL - Abnormal; Notable for the following components:      Result Value   Glucose, Bld 114 (*)    All other components within normal limits  CBC WITH DIFFERENTIAL/PLATELET  URINALYSIS, ROUTINE W REFLEX MICROSCOPIC  I-STAT BETA HCG BLOOD, ED ([facility], [facility], [facility] ONLY)  TROPONIN I (HIGH SENSITIVITY)  TROPONIN I (HIGH SENSITIVITY)    EKG EKG Interpretation  Date/Time:  Monday Aug 19 2021 12:29:02 EDT Ventricular Rate:  74 PR Interval:  114 QRS Duration: 78 QT Interval:  380 QTC Calculation: 421 R Axis:   84 Text Interpretation: Normal sinus  rhythm Normal ECG When compared with ECG of 19-Jul-2016 22:02, PREVIOUS ECG IS PRESENT Confirmed by Ernie Avena ([id number]) on 08/19/2021 1:09:23 PM  Radiology DG Chest 2 View  Result Date: 08/19/2021 CLINICAL DATA:  Palpitations EXAM: CHEST - 2 VIEW COMPARISON:  07/19/2016 FINDINGS: Cardiac and mediastinal contours are within normal limits. No focal pulmonary opacity. No pleural effusion or pneumothorax. No acute osseous abnormality. IMPRESSION: No acute cardiopulmonary process. Electronically Signed   By: Wiliam Ke M.D.   On: 08/19/2021 12:59   CT HEAD WO CONTRAST ( )  Result Date: 08/19/2021 CLINICAL DATA:  Vision loss EXAM: CT HEAD WITHOUT CONTRAST TECHNIQUE: Contiguous axial images were obtained from the base of the skull through the vertex without intravenous contrast. RADIATION DOSE REDUCTION: This exam was performed according to the departmental dose-optimization program which includes automated exposure control, adjustment of the mA and/or kV according to patient size and/or use of iterative reconstruction technique. COMPARISON:  CT head 08/09/2016 FINDINGS: Brain: No acute intracranial hemorrhage, mass effect, or herniation. No extra-axial fluid collections. No  evidence of acute territorial infarct. No hydrocephalus. Vascular: No hyperdense vessel or unexpected calcification. Skull: Normal. Negative for fracture or focal lesion. Sinuses/Orbits: No acute finding. Other: None. IMPRESSION: No acute intracranial process identified. Electronically Signed   By: Jannifer Hick M.D.   On: 08/19/2021 12:47   MR BRAIN W WO CONTRAST  Result Date: 08/19/2021 CLINICAL DATA:  Loss of vision EXAM: MRI HEAD WITHOUT AND WITH CONTRAST TECHNIQUE: Multiplanar, multiecho pulse sequences of the brain and surrounding structures were obtained without and with intravenous contrast. CONTRAST:  5.60mL GADAVIST GADOBUTROL 1 MMOL/ML IV SOLN COMPARISON:  06/18/2018 MRI, correlation is also made with CT head 08/19/2021 FINDINGS: Brain: No restricted diffusion to suggest acute or subacute infarct. No acute hemorrhage, mass, mass effect, or midline shift. No abnormal parenchymal or meningeal enhancement. No hydrocephalus or extra-axial collection. No significant abnormal T2 hyperintense signal in the periventricular or juxtacortical white matter. Pituitary is normal in size and signal. Normal craniocervical junction. Vascular: Normal arterial flow voids. Patent venous sinuses on postcontrast imaging. Skull and upper cervical spine: Normal marrow signal. Sinuses/Orbits: Negative. Other: The mastoids are well aerated. IMPRESSION: No acute intracranial process. No evidence of acute or subacute infarct or demyelinating disease. No etiology is seen for the patient's vision loss. Electronically Signed   By: Wiliam Ke M.D.   On: 08/19/2021 18:17   MR Cervical Spine W or Wo Contrast  Result Date: 08/19/2021 CLINICAL DATA:  Myelopathy, acute, cervical spine EXAM: MRI CERVICAL SPINE WITHOUT AND WITH CONTRAST TECHNIQUE: Multiplanar and multiecho pulse sequences of the cervical spine, to include the craniocervical junction and cervicothoracic junction, were obtained without and with intravenous contrast.  CONTRAST:  5.79mL GADAVIST GADOBUTROL 1 MMOL/ML IV SOLN COMPARISON:  11/20/2018 FINDINGS: Alignment: Physiologic. Vertebrae: No fracture, evidence of discitis, or bone lesion. Cord: Normal signal and morphology. No abnormal postcontrast enhancement. Posterior Fossa, vertebral arteries, paraspinal tissues: Negative. Disc levels: Negative. Intervertebral disc heights of the cervical spine are preserved. No significant disc protrusion. Facet joints within normal limits. No foraminal or canal stenosis at any level. IMPRESSION: Unremarkable pre and post-contrast MRI of the cervical spine. Electronically Signed   By: Duanne Guess D.O.   On: 08/19/2021 18:15    Procedures Procedures    Medications Ordered in ED Medications  gadobutrol (GADAVIST) 1 MMOL/ML injection 5.5 mL (5.5 mLs Intravenous Contrast Given 08/19/21 1705)    ED Course/ Medical Decision Making/ A&P  Medical Decision Making Amount and/or Complexity of Data Reviewed Labs: ordered. Radiology: ordered.  Risk Prescription drug management.   46 year old female with a history of melanoma, spina bifida and tethered cord with chronic right lower extremity weakness and numbness, HSV, HPV, presenting to the emergency department with neurologic complaints.  The patient states that she had two moles removed from her neck last Monday. She had localized lidocaine injected.  She was not intubated or under significant deep sedation at the time.  She felt numb down her right arm after waking up from the procedure. She woke up the following day with persistent symptoms. Transient episodes of right eye vision loss three times in total since the episode. She has had a dull headache. Endorses heart palpitations with an associated pounding in her chest. Currently she denies any vision loss. Was having blurry vision up until last Saturday. Has been very fatigued. Se still endorses numbness down her right arm. Feels like she has a  pulled muscle in her right neck and above her right clavicle.   On arrival, the patient was vitally stable.  NSR noted on cardiac telemetry.  Patient is presenting with roughly 1 week of right upper extremity weakness.  She has right lower extremity weakness at baseline due to history of spina bifida and tethered cord.  Symptoms began after injection of lidocaine into her right lateral neck for removal of 2 dysplastic nevi in clinic with dermatology outpatient.  On exam, the patient does have new right upper extremity weakness and numbness.  She has no visual field deficits.  She has no vision loss.  Differential diagnoses include CVA, peripheral neuropathy, brachial plexopathy, anesthetic reaction, Horner's syndrome , CRAO.  Initial work-up initiated to include CT head and chest x-ray which were negative for acute abnormalities.  Screening laboratory work-up was also unremarkable.  I did discuss the patient's case with Dr. Selina Cooley of on-call neurology.  She recommended MRI of the brain and cervical spine.  If negative, the patient can follow-up outpatient.  MRI imaging was pending at time of signout.  Disposition pending MRI imaging results.  Signout given to Dr. Stevie Kern at [phone].  Final Clinical Impression(s) / ED Diagnoses Final diagnoses:  None    Rx / DC Orders ED Discharge Orders     None         Ernie Avena, MD 08/19/21 Donna Bernard, MD 08/19/21 1902

## 2021-08-19 NOTE — ED Notes (Signed)
Patient in MRI at this time. 

## 2021-08-19 NOTE — ED Triage Notes (Signed)
Pt reports having 2 moles removed on her right side of neck last Monday, where the Dr used lots of numbing medication. Pt states that she was grocery shopping today and noticed having trouble using her right shoulder. Also having palpitations. Pt woke up last Tuesday with vision trouble in right eye.  ?

## 2021-08-19 NOTE — ED Provider Triage Note (Signed)
Emergency Medicine Provider Triage Evaluation Note ? ?Melissa Pruitt , a 46 y.o. female  was evaluated in triage.  Pt complains of right eye vision loss, numbness/weakness to right arm, and palpitations ? ?Patient reports that she had a procedure last Monday to remove 2 moles on side of her neck.  Patient states that starting on Tuesday she woke up with vision loss to her right eye.  Vision has slowly been coming back to normal in that right eye.  Patient also reports that she has been dealing with numbness and weakness to her right upper arm since last Tuesday.  Patient also reports intermittent palpitations since last Tuesday.  Patient states that she has associated chest pain and shortness of breath with palpitations.  Denies any aggravating factors. ? ?Review of Systems  ?Positive: Palpitations, chest pain, shortness of breath, numbness, weakness, vision loss ?Negative: Facial asymmetry, dysarthria, syncope, nausea, vomiting, diplopia ? ?Physical Exam  ?BP 119/77 (BP Location: Right Arm)   Pulse 76   Temp 99.2 ?F (37.3 ?C) (Oral)   Resp 16   Ht '5\' 4"'$  (1.626 m)   Wt 54.4 kg   SpO2 100%   BMI 20.60 kg/m?  ?Gen:   Awake, no distress   ?Resp:  Normal effort  ?MSK:   Moves extremities without difficulty  ?Other:  +2 radial pulse bilaterally. ? ?No facial asymmetry or dysarthria.  Decreased grip strength to right hand.  Additionally patient complains of decreased strength to right lower extremity however states that is baseline due to her spina bifida. ? ?Medical Decision Making  ?Medically screening exam initiated at 12:30 PM.  Appropriate orders placed.  Melissa Pruitt was informed that the remainder of the evaluation will be completed by another provider, this initial triage assessment does not replace that evaluation, and the importance of remaining in the ED until their evaluation is complete. ? ? ?  ?Loni Beckwith, PA-C ?08/19/21 1232 ? ?

## 2021-08-19 NOTE — Discharge Instructions (Signed)
Please follow-up with your primary care doctor to discuss your symptoms from today.  I additionally recommend following up with a neurologist.  Call their office tomorrow to request appointment.  If you develop increasing numbness, weakness, speech change, vision change or other new concerning symptom, come back to ER for reassessment. ?

## 2021-08-20 ENCOUNTER — Telehealth: Payer: Self-pay

## 2021-08-20 ENCOUNTER — Encounter: Payer: Self-pay | Admitting: Dermatology

## 2021-08-20 NOTE — Telephone Encounter (Signed)
Called pt and advised of bx results.   ? ?After pt's appt 08/12/21, pt said she had numbness in her shoulders.  She said she went to the grocery store after appt and tried to get something off the shelf and dropped it.  The next day she woke up with loss of vision in her R eye, with auras, numbness in her face, tongue and hands.  She later developed a headache and due to her persistent headache her PCP recommended her go to the ER.  She said the ER doctor said she possibly had migraine symptoms/adverse reaction to lidocaine.  Pt was advised to f/u with neurologist.  Pt also advised she had similar symptoms ~20 yrs ago.  I added lidocaine to her allergy list and advised pt I would pass along to Dr. Oneta Rack ?

## 2021-08-20 NOTE — Telephone Encounter (Signed)
-----   Message from Ralene Bathe, MD sent at 08/13/2021  8:46 PM EDT ----- ?Diagnosis ?1. Skin , right lateral neck post ?DYSPLASTIC JUNCTIONAL NEVUS WITH MODERATE ATYPIA, CLOSE TO MARGIN ?2. Skin , right lateral neck anterior ?MELANOCYTIC NEVUS, INTRADERMAL TYPE, BASE INVOLVED ? ?1- moderate dysplastic ?Recheck next visit ?2- benign mole ?No further treatment needed. ?

## 2021-11-12 ENCOUNTER — Encounter: Payer: Self-pay | Admitting: Obstetrics and Gynecology

## 2021-11-12 ENCOUNTER — Ambulatory Visit (INDEPENDENT_AMBULATORY_CARE_PROVIDER_SITE_OTHER): Payer: Medicaid Other | Admitting: Obstetrics and Gynecology

## 2021-11-12 VITALS — BP 103/58 | HR 67

## 2021-11-12 DIAGNOSIS — N898 Other specified noninflammatory disorders of vagina: Secondary | ICD-10-CM

## 2021-11-12 DIAGNOSIS — N319 Neuromuscular dysfunction of bladder, unspecified: Secondary | ICD-10-CM | POA: Diagnosis not present

## 2021-11-12 DIAGNOSIS — N390 Urinary tract infection, site not specified: Secondary | ICD-10-CM | POA: Diagnosis not present

## 2021-11-12 NOTE — Progress Notes (Unsigned)
[facility] Return Visit  SUBJECTIVE  History of Present Illness: Melissa Pruitt is a 46 y.o. female seen in follow-up for neurogenic bladder, recurrent UTI and vaginal cyst.   A few weeks ago (10/18/21), she was diagnosed with a UTI. She was prescribed the augmentin. She was having symptoms of urgency at the time. Last infection before that was in January.   Patient pulled up [facility] results which showed:  -Positive for Enterococcus faecalis. Resistant to bactrim and tetracycline, susceptible to cipro/ levo/ macrobid/ pencillin/vancomycin.   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   History of myelomeningocele and duplicated right collecting system and self-catheterizes for neurogenic bladder.  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   Past Surgical History: She  has a past surgical history that includes Back surgery; Bladder surgery; Colostomy; Tubal ligation; removal of cervical cancer; colostomy reversal; Exploratory laparotomy; and LEEP.   Medications: She has a current medication list which includes the following prescription(s): conjugated estrogens, cyanocobalamin, epinephrine, estradiol, fluticasone, hydrocortisone,  loratadine, methocarbamol, montelukast, mupirocin ointment, mupirocin ointment, and norethindrone.   Allergies: Patient is allergic to ciprofloxacin, macrobid [nitrofurantoin monohyd macro], morphine and related, azithromycin, dilaudid [hydromorphone hcl], lactose, prednisolone, prednisone, sulfa antibiotics, tramadol hcl, vitamin d analogs, lidocaine, and pork-derived products.   Social History: Patient  reports that she quit smoking about 10 years ago. Her smoking use included cigarettes. She has never used smokeless tobacco. She reports current alcohol use. She reports that she does not use drugs.      OBJECTIVE     Physical Exam:  Gen: No apparent distress, A&O x 3.     ASSESSMENT AND PLAN    Ms. Goonan is a 46 y.o. with:  No diagnosis found.  -  We discussed that she should only be testing for infection when she has symptoms of a UTI.  - repeat renal US in the fall to assess upper tracts (every 2 years)- ordred  Neurogenic bladder - Continue self-catheterization   AUA NLUTD surveillance- Moderate risk due to DO with incomplete emptying on UDS: - annual renal function assessment - upper tract imaging- q 1-2 yrs - UDS repeat if change in sign/ symptoms - cysto only if rUTIs, hematuria or suspected anomaly   2. Recurrent UTI - discussed obtaining culture only if having symptoms - had been prescribed estrace cream last visit but not using consistently. Use 0.5g twice a week for UTI prevention.    3. Vaginal mass - stable in size - ok to monitor until she desires surgical excision  Can follow up 6 months- wants to discuss possible surgery for vaginal cyst at that time.   Jaquita Folds, MD  Time spent: I spent 20 minutes dedicated to the care of this patient on the date of this encounter to include pre-visit review of records, face-to-face time with the patient and post visit documentation.

## 2021-11-13 ENCOUNTER — Encounter: Payer: Self-pay | Admitting: Obstetrics and Gynecology

## 2021-12-30 ENCOUNTER — Encounter: Payer: Self-pay | Admitting: Internal Medicine

## 2022-01-06 ENCOUNTER — Ambulatory Visit: Payer: Medicaid Other | Admitting: Dermatology

## 2022-01-06 DIAGNOSIS — D1801 Hemangioma of skin and subcutaneous tissue: Secondary | ICD-10-CM

## 2022-01-06 DIAGNOSIS — D239 Other benign neoplasm of skin, unspecified: Secondary | ICD-10-CM

## 2022-01-06 DIAGNOSIS — Z1283 Encounter for screening for malignant neoplasm of skin: Secondary | ICD-10-CM | POA: Diagnosis not present

## 2022-01-06 DIAGNOSIS — D229 Melanocytic nevi, unspecified: Secondary | ICD-10-CM

## 2022-01-06 DIAGNOSIS — L578 Other skin changes due to chronic exposure to nonionizing radiation: Secondary | ICD-10-CM

## 2022-01-06 DIAGNOSIS — Z86018 Personal history of other benign neoplasm: Secondary | ICD-10-CM

## 2022-01-06 DIAGNOSIS — A770 Spotted fever due to Rickettsia rickettsii: Secondary | ICD-10-CM

## 2022-01-06 DIAGNOSIS — L814 Other melanin hyperpigmentation: Secondary | ICD-10-CM

## 2022-01-06 DIAGNOSIS — W57XXXA Bitten or stung by nonvenomous insect and other nonvenomous arthropods, initial encounter: Secondary | ICD-10-CM

## 2022-01-06 DIAGNOSIS — Z85828 Personal history of other malignant neoplasm of skin: Secondary | ICD-10-CM

## 2022-01-06 DIAGNOSIS — S30860A Insect bite (nonvenomous) of lower back and pelvis, initial encounter: Secondary | ICD-10-CM | POA: Diagnosis not present

## 2022-01-06 DIAGNOSIS — L821 Other seborrheic keratosis: Secondary | ICD-10-CM

## 2022-01-06 MED ORDER — DOXYCYCLINE MONOHYDRATE 100 MG PO CAPS: 100.0000 mg | ORAL_CAPSULE | Freq: Two times a day (BID) | ORAL | 0 refills | Status: DC

## 2022-01-06 NOTE — Progress Notes (Unsigned)
Follow-Up Visit   Subjective  Melissa Pruitt is a 46 y.o. female who presents for the following: Annual Exam (4 mth tbse, hx of melanoma, hx of multiple dysplastic nevi. Patient reports recently bit by tick on back and developed RMSF. Patient was treated by PCP and prescribed doxycycline 14 days course and mupirocin ointment to apply topically. She states area is still very itchy and is not sure if some of the tick may be in skin. ). The patient presents for Total-Body Skin Exam (TBSE) for skin cancer screening and mole check.  The patient has spots, moles and lesions to be evaluated, some may be new or changing and the patient has concerns that these could be cancer.  The following portions of the chart were reviewed this encounter and updated as appropriate:  Tobacco  Allergies  Meds  Problems  Med Hx  Surg Hx  Fam Hx     Review of Systems: No other skin or systemic complaints except as noted in HPI or Assessment and Plan.  Objective  Well appearing patient in no apparent distress; mood and affect are within normal limits.  A full examination was performed including scalp, head, eyes, ears, nose, lips, neck, chest, axillae, abdomen, back, buttocks, bilateral upper extremities, bilateral lower extremities, hands, feet, fingers, toes, fingernails, and toenails. All findings within normal limits unless otherwise noted below.   Assessment & Plan  Tick bite of back wall of thorax, unspecified location, initial encounter left lower back Bite reaction from tick bite Patient reports positive IGG for spotty rocky mountain fever -reviewed report today on patient's phone as it is not in her epic record.  Has completed 10-day course doxycycline but still feels terrible.  Will prescribe doxycyline 100 bid with food for 1 month.  Advised patient I would recommend she take it.  Although he has had a 10-day course already, it sometimes gives better results with a longer course.  Doxycycline  should be taken with food to prevent nausea. Do not lay down for 30 minutes after taking. Be cautious with sun exposure and use good sun protection while on this medication. Pregnant women should not take this medication.   doxycycline (MONODOX) 100 MG capsule - left lower back Take 1 capsule (100 mg total) by mouth 2 (two) times daily. Take with food and drink.  Lentigines - Scattered tan macules - Due to sun exposure - Benign-appearing, observe - Recommend daily broad spectrum sunscreen SPF 30+ to sun-exposed areas, reapply every 2 hours as needed. - Call for any changes  Seborrheic Keratoses - Stuck-on, waxy, tan-brown papules and/or plaques  - Benign-appearing - Discussed benign etiology and prognosis. - Observe - Call for any changes  Melanocytic Nevi - Tan-brown and/or pink-flesh-colored symmetric macules and papules - Benign appearing on exam today - Observation - Call clinic for new or changing moles - Recommend daily use of broad spectrum spf 30+ sunscreen to sun-exposed areas.   Hemangiomas - Red papules - Discussed benign nature - Observe - Call for any changes  Actinic Damage - Chronic condition, secondary to cumulative UV/sun exposure - diffuse scaly erythematous macules with underlying dyspigmentation - Recommend daily broad spectrum sunscreen SPF 30+ to sun-exposed areas, reapply every 2 hours as needed.  - Staying in the shade or wearing long sleeves, sun glasses (UVA+UVB protection) and wide brim hats (4-inch brim around the entire circumference of the hat) are also recommended for sun protection.  - Call for new or changing lesions.  History of  Dysplastic Nevi Multiple locations see history  - No evidence of recurrence today - Recommend regular full body skin exams - Recommend daily broad spectrum sunscreen SPF 30+ to sun-exposed areas, reapply every 2 hours as needed.  - Call if any new or changing lesions are noted between office visits  History of  Melanoma Bx proven, BRESLOW'S DEPTH/MAXIMUM TUMOR THICKNESS: 0.5 MM, CLARK/ANATOMIC LEVEL: II Excised 03/20/2020 Melanoma [place] Testing - Class IA lowest risk  03/13/20 Specimen ID : [id number] - No evidence of recurrence today - No lymphadenopathy - Recommend regular full body skin exams - Recommend daily broad spectrum sunscreen SPF 30+ to sun-exposed areas, reapply every 2 hours as needed.  - Call if any new or changing lesions are noted between office visits  Skin cancer screening performed today. Return for punch bx for tick bite at back, 4 tbse hx of melanoma. IRuthell Rummage, CMA, am acting as scribe for Sarina Ser, MD. Documentation: I have reviewed the above documentation for accuracy and completeness, and I agree with the above.  Sarina Ser, MD

## 2022-01-06 NOTE — Patient Instructions (Addendum)
Doxycycline should be taken with food to prevent nausea. Do not lay down for 30 minutes after taking. Be cautious with sun exposure and use good sun protection while on this medication. Pregnant women should not take this medication.     Melanoma ABCDEs  Melanoma is the most dangerous type of skin cancer, and is the leading cause of death from skin disease.  You are more likely to develop melanoma if you: Have light-colored skin, light-colored eyes, or red or blond hair Spend a lot of time in the sun Tan regularly, either outdoors or in a tanning bed Have had blistering sunburns, especially during childhood Have a close family member who has had a melanoma Have atypical moles or large birthmarks  Early detection of melanoma is key since treatment is typically straightforward and cure rates are extremely high if we catch it early.   The first sign of melanoma is often a change in a mole or a new dark spot.  The ABCDE system is a way of remembering the signs of melanoma.  A for asymmetry:  The two halves do not match. B for border:  The edges of the growth are irregular. C for color:  A mixture of colors are present instead of an even brown color. D for diameter:  Melanomas are usually (but not always) greater than 11m - the size of a pencil eraser. E for evolution:  The spot keeps changing in size, shape, and color.  Please check your skin once per month between visits. You can use a small mirror in front and a large mirror behind you to keep an eye on the back side or your body.   If you see any new or changing lesions before your next follow-up, please call to schedule a visit.  Please continue daily skin protection including broad spectrum sunscreen SPF 30+ to sun-exposed areas, reapplying every 2 hours as needed when you're outdoors.   Staying in the shade or wearing long sleeves, sun glasses (UVA+UVB protection) and wide brim hats (4-inch brim around the entire circumference of the  hat) are also recommended for sun protection.    Due to recent changes in healthcare laws, you may see results of your pathology and/or laboratory studies on MyChart before the doctors have had a chance to review them. We understand that in some cases there may be results that are confusing or concerning to you. Please understand that not all results are received at the same time and often the doctors may need to interpret multiple results in order to provide you with the best plan of care or course of treatment. Therefore, we ask that you please give u[place]2 business days to thoroughly review all your results before contacting the office for clarification. Should we see a critical lab result, you will be contacted sooner.   If You Need Anything After Your Visit  If you have any questions or concerns for your doctor, please call our main line at [phone] press option 4 to reach your doctor's medical assistant. If no one answers, please leave a voicemail as directed and we will return your call as soon as possible. Messages left after 4 pm will be answered the following business day.   You may also send [place] message via [place] We typically respond to MyChart messages within 1-2 business days.  For prescription refills, please ask your pharmacy to contact our office. Our fax number is [phone]  If you have an urgent issue when  the clinic is closed that cannot wait until the next business day, you can page your doctor at the number below.    Please note that while we do our best to be available for urgent issues outside of office hours, we are not available 24/7.   If you have an urgent issue and are unable to reach [place], you may choose to seek medical care at your doctor's office, retail clinic, urgent care center, or emergency room.  If you have a medical emergency, please immediately call 911 or go to the emergency department.  Pager Numbers  - Dr. Nehemiah Massed: [phone]  - Dr.  Laurence Ferrari: [phone]  - Dr. Nicole Kindred: [phone]  In the event of inclement weather, please call our main line at [phone] for an update on the status of any delays or closures.  Dermatology Medication Tips: Please keep the boxes that topical medications come in in order to help keep track of the instructions about where and how to use these. Pharmacies typically print the medication instructions only on the boxes and not directly on the medication tubes.   If your medication is too expensive, please contact our office at [phone] option 4 or send [place] a message through [place].   We are unable to tell what your co-pay for medications will be in advance as this is different depending on your insurance coverage. However, we may be able to find a substitute medication at lower cost or fill out paperwork to get insurance to cover a needed medication.   If a prior authorization is required to get your medication covered by your insurance company, please allow [place] 1-2 business days to complete this process.  Drug prices often vary depending on where the prescription is filled and some pharmacies may offer cheaper prices.  The website www.goodrx.com contains coupons for medications through different pharmacies. The prices here do not account for what the cost may be with help from insurance (it may be cheaper with your insurance), but the website can give you the price if you did not use any insurance.  - You can print the associated coupon and take it with your prescription to the pharmacy.  - You may also stop by our office during regular business hours and pick up a GoodRx coupon card.  - If you need your prescription sent electronically to a different pharmacy, notify our office through [facility] or by phone at [phone] option 4.     Si Usted Necesita Algo Despus de Su Visita  Tambin puede enviarnos un mensaje a travs de Pharmacist, community. Por lo general respondemos a los mensajes  de MyChart en el transcurso de 1 a 2 das hbiles.  Para renovar recetas, por favor pida a su farmacia que se ponga en contacto con nuestra oficina. Harland Dingwall de fax es [place] [phone].  Si tiene un asunto urgente cuando la clnica est cerrada y que no puede esperar hasta el siguiente da hbil, puede llamar/localizar a su doctor(a) al nmero que aparece a continuacin.   Por favor, tenga en cuenta que aunque hacemos todo lo posible para estar disponibles para asuntos urgentes fuera del horario de [place], no estamos disponibles las 24 horas del da, los 7 das de la [place].   Si tiene un problema urgente y no puede comunicarse con nosotros, puede optar por buscar atencin mdica  en el consultorio de su doctor(a), en una clnica privada, en un centro de atencin urgente o en una sala de emergencias.  Si tiene  una emergencia mdica, por favor llame inmediatamente al 911 o vaya a la sala de emergencias.  Nmeros de bper  - Dr. Nehemiah Massed: [phone]  - Dra. Moye: [phone]  - Dra. Nicole Kindred: [phone]  En caso de inclemencias del [place], por favor llame a Johnsie Kindred principal al [phone] para una actualizacin sobre el [place] de cualquier retraso o cierre.  Consejos para la medicacin en dermatologa: Por favor, guarde las cajas en las que vienen los medicamentos de uso tpico para ayudarle a seguir las instrucciones sobre dnde y cmo usarlos. Las farmacias generalmente imprimen las instrucciones del medicamento slo en las cajas y no directamente en los tubos del [place].   Si su medicamento es muy caro, por favor, pngase en contacto con Zigmund Daniel llamando al [phone] y presione la opcin 4 o envenos un mensaje a travs de Pharmacist, community.   No podemos decirle cul ser su copago por los medicamentos por adelantado ya que esto es diferente dependiendo de la cobertura de su seguro. Sin embargo, es posible que podamos encontrar un medicamento sustituto a Actor un formulario para que el seguro cubra el medicamento que se considera necesario.   Si se requiere una autorizacin previa para que su compaa de seguros [place] su medicamento, por favor permtanos de 1 a 2 das hbiles para completar este proceso.  Los precios de los medicamentos varan con frecuencia dependiendo del Environmental consultant de dnde se surte la receta y alguna farmacias pueden ofrecer precios ms baratos.  El sitio web www.goodrx.com tiene cupones para medicamentos de Airline pilot. Los precios aqu no tienen en cuenta lo que podra costar con la ayuda del seguro (puede ser ms barato con su seguro), pero el sitio web puede darle el precio si no utiliz Research scientist (physical sciences).  - Puede imprimir el cupn correspondiente y llevarlo con su receta a la farmacia.  - Tambin puede pasar por nuestra oficina durante el horario de atencin regular y Charity fundraiser una tarjeta de cupones de GoodRx.  - Si necesita que su receta se enve electrnicamente a una farmacia diferente, informe a nuestra oficina a travs de MyChart de [place] o por telfono llamando al [phone] y presione la opcin 4.

## 2022-01-07 ENCOUNTER — Encounter: Payer: Self-pay | Admitting: Dermatology

## 2022-01-09 ENCOUNTER — Encounter: Payer: Self-pay | Admitting: Dermatology

## 2022-01-09 ENCOUNTER — Ambulatory Visit: Payer: Medicaid Other | Admitting: Dermatology

## 2022-01-09 DIAGNOSIS — W57XXXD Bitten or stung by nonvenomous insect and other nonvenomous arthropods, subsequent encounter: Secondary | ICD-10-CM | POA: Diagnosis not present

## 2022-01-09 DIAGNOSIS — S20462D Insect bite (nonvenomous) of left back wall of thorax, subsequent encounter: Secondary | ICD-10-CM | POA: Diagnosis not present

## 2022-01-09 DIAGNOSIS — Z79899 Other long term (current) drug therapy: Secondary | ICD-10-CM

## 2022-01-09 MED ORDER — CLOBETASOL PROPIONATE 0.05 % EX CREA: TOPICAL_CREAM | CUTANEOUS | 0 refills | Status: DC

## 2022-01-09 NOTE — Patient Instructions (Addendum)
Topical steroids (such as triamcinolone, fluocinolone, fluocinonide, mometasone, clobetasol, halobetasol, betamethasone, hydrocortisone) can cause thinning and lightening of the skin if they are used for too long in the same area. Your physician has selected the right strength medicine for your problem and area affected on the body. Please use your medication only as directed by your physician to prevent side effects.    Due to recent changes in healthcare laws, you may see results of your pathology and/or laboratory studies on MyChart before the doctors have had a chance to review them. We understand that in some cases there may be results that are confusing or concerning to you. Please understand that not all results are received at the same time and often the doctors may need to interpret multiple results in order to provide you with the best plan of care or course of treatment. Therefore, we ask that you please give us 2 business days to thoroughly review all your results before contacting the office for clarification. Should we see a critical lab result, you will be contacted sooner.   If You Need Anything After Your Visit  If you have any questions or concerns for your doctor, please call our main line at [phone] and press option 4 to reach your doctor's medical assistant. If no one answers, please leave a voicemail as directed and we will return your call as soon as possible. Messages left after 4 pm will be answered the following business day.   You may also send us a message via MyChart. We typically respond to MyChart messages within 1-2 business days.  For prescription refills, please ask your pharmacy to contact our office. Our fax number is [phone].  If you have an urgent issue when the clinic is closed that cannot wait until the next business day, you can page your doctor at the number below.    Please note that while we do our best to be available for urgent issues outside of  office hours, we are not available 24/7.   If you have an urgent issue and are unable to reach us, you may choose to seek medical care at your doctor's office, retail clinic, urgent care center, or emergency room.  If you have a medical emergency, please immediately call 911 or go to the emergency department.  Pager Numbers  - Dr. Kowalski: [phone]  - Dr. Moye: [phone]  - Dr. Stewart: [phone]  In the event of inclement weather, please call our main line at [phone] for an update on the status of any delays or closures.  Dermatology Medication Tips: Please keep the boxes that topical medications come in in order to help keep track of the instructions about where and how to use these. Pharmacies typically print the medication instructions only on the boxes and not directly on the medication tubes.   If your medication is too expensive, please contact our office at [phone] option 4 or send us a message through MyChart.   We are unable to tell what your co-pay for medications will be in advance as this is different depending on your insurance coverage. However, we may be able to find a substitute medication at lower cost or fill out paperwork to get insurance to cover a needed medication.   If a prior authorization is required to get your medication covered by your insurance company, please allow us 1-2 business days to complete this process.  Drug prices often vary depending on where the prescription is filled and some pharmacies   may offer cheaper prices.  The website www.goodrx.com contains coupons for medications through different pharmacies. The prices here do not account for what the cost may be with help from insurance (it may be cheaper with your insurance), but the website can give you the price if you did not use any insurance.  - You can print the associated coupon and take it with your prescription to the pharmacy.  - You may also stop by our office during  regular business hours and pick up a GoodRx coupon card.  - If you need your prescription sent electronically to a different pharmacy, notify our office through [place] MyChart or by phone at [phone] option 4.     Si Usted Necesita Algo Despus de Su Visita  Tambin puede enviarnos un mensaje a travs de MyChart. Por lo general respondemos a los mensajes de MyChart en el transcurso de 1 a 2 das hbiles.  Para renovar recetas, por favor pida a su farmacia que se ponga en contacto con nuestra oficina. Nuestro nmero de fax es el [phone].  Si tiene un asunto urgente cuando la clnica est cerrada y que no puede esperar hasta el siguiente da hbil, puede llamar/localizar a su doctor(a) al nmero que aparece a continuacin.   Por favor, tenga en cuenta que aunque hacemos todo lo posible para estar disponibles para asuntos urgentes fuera del horario de oficina, no estamos disponibles las 24 horas del da, los 7 das de la semana.   Si tiene un problema urgente y no puede comunicarse con nosotros, puede optar por buscar atencin mdica  en el consultorio de su doctor(a), en una clnica privada, en un centro de atencin urgente o en una sala de emergencias.  Si tiene una emergencia mdica, por favor llame inmediatamente al 911 o vaya a la sala de emergencias.  Nmeros de bper  - Dr. Kowalski: [phone]  - Dra. Moye: [phone]  - Dra. Stewart: [phone]  En caso de inclemencias del tiempo, por favor llame a nuestra lnea principal al [phone] para una actualizacin sobre el estado de cualquier retraso o cierre.  Consejos para la medicacin en dermatologa: Por favor, guarde las cajas en las que vienen los medicamentos de uso tpico para ayudarle a seguir las instrucciones sobre dnde y cmo usarlos. Las farmacias generalmente imprimen las instrucciones del medicamento slo en las cajas y no directamente en los tubos del medicamento.   Si su medicamento es muy  caro, por favor, pngase en contacto con nuestra oficina llamando al [phone] y presione la opcin 4 o envenos un mensaje a travs de MyChart.   No podemos decirle cul ser su copago por los medicamentos por adelantado ya que esto es diferente dependiendo de la cobertura de su seguro. Sin embargo, es posible que podamos encontrar un medicamento sustituto a menor costo o llenar un formulario para que el seguro cubra el medicamento que se considera necesario.   Si se requiere una autorizacin previa para que su compaa de seguros cubra su medicamento, por favor permtanos de 1 a 2 das hbiles para completar este proceso.  Los precios de los medicamentos varan con frecuencia dependiendo del lugar de dnde se surte la receta y alguna farmacias pueden ofrecer precios ms baratos.  El sitio web www.goodrx.com tiene cupones para medicamentos de diferentes farmacias. Los precios aqu no tienen en cuenta lo que podra costar con la ayuda del seguro (puede ser ms barato con su seguro), pero el sitio web puede darle el precio si no   utiliz ningn seguro.  - Puede imprimir el cupn correspondiente y llevarlo con su receta a la farmacia.  - Tambin puede pasar por nuestra oficina durante el horario de atencin regular y recoger una tarjeta de cupones de GoodRx.  - Si necesita que su receta se enve electrnicamente a una farmacia diferente, informe a nuestra oficina a travs de MyChart de [place] o por telfono llamando al [phone] y presione la opcin 4.  

## 2022-01-09 NOTE — Progress Notes (Signed)
   Follow-Up Visit   Subjective  Melissa Pruitt is a 45 y.o. female who presents for the following: Follow-up (Patient here today for punch biopsy at back after prior tick bite. Patient concerned part of tick may still be inside affected skin. Patient reports she would like provider to re-evaluate area again before going forward with biopsy. ).  The following portions of the chart were reviewed this encounter and updated as appropriate:  Tobacco  Allergies  Meds  Problems  Med Hx  Surg Hx  Fam Hx     Review of Systems: No other skin or systemic complaints except as noted in HPI or Assessment and Plan.  Objective  Well appearing patient in no apparent distress; mood and affect are within normal limits.  A focused examination was performed including left lower back. Relevant physical exam findings are noted in the Assessment and Plan.   Assessment & Plan  Tick bite of left back wall of thorax, subsequent encounter Left  Back Bite reaction from tick bite Patient reports positive IGG for spotty rocky mountain fever -reviewed report today on patient's phone as it is not in her epic record.   Has completed 10-day course doxycycline but still feels terrible. Recommend patient restart and continue doxycycline.  She was prescribed doxycyline 100 bid with food for 1 month.    Patient deferred punch excision of tick bite area. Patient advised if not resolved can come back and have area excised.  Start clobetasol cream 0.05 % apply topically to aa of back twice qd and keep covered with bandage. Can use for up to 2 weeks.  Avoid applying to face, groin, and axilla. Use as directed. Long-term use can cause thinning of the skin.  Topical steroids (such as triamcinolone, fluocinolone, fluocinonide, mometasone, clobetasol, halobetasol, betamethasone, hydrocortisone) can cause thinning and lightening of the skin if they are used for too long in the same area. Your physician has selected the  right strength medicine for your problem and area affected on the body. Please use your medication only as directed by your physician to prevent side effects.   Doxycycline should be taken with food to prevent nausea. Do not lay down for 30 minutes after taking. Be cautious with sun exposure and use good sun protection while on this medication. Pregnant women should not take this medication.   clobetasol cream (TEMOVATE) 0.05 % - Left Lower Back Apply topically aa of back twice daily and cover with bandage. Use for up to 2 weeks. Avoid applying to face, groin, and axilla. Use as directed.  Return if symptoms worsen or fail to improve. IRuthell Rummage, CMA, am acting as scribe for Sarina Ser, MD. Documentation: I have reviewed the above documentation for accuracy and completeness, and I agree with the above.  Sarina Ser, MD

## 2022-01-16 ENCOUNTER — Ambulatory Visit: Payer: Medicaid Other | Attending: Neurology | Admitting: Physical Therapy

## 2022-01-16 DIAGNOSIS — R279 Unspecified lack of coordination: Secondary | ICD-10-CM | POA: Diagnosis not present

## 2022-01-16 DIAGNOSIS — R293 Abnormal posture: Secondary | ICD-10-CM | POA: Insufficient documentation

## 2022-01-16 DIAGNOSIS — M62838 Other muscle spasm: Secondary | ICD-10-CM | POA: Diagnosis not present

## 2022-01-16 DIAGNOSIS — M6281 Muscle weakness (generalized): Secondary | ICD-10-CM | POA: Diagnosis not present

## 2022-01-16 DIAGNOSIS — R102 Pelvic and perineal pain: Secondary | ICD-10-CM | POA: Diagnosis present

## 2022-01-16 NOTE — Therapy (Signed)
OUTPATIENT PHYSICAL THERAPY FEMALE PELVIC EVALUATION   Patient Name: Melissa Pruitt MRN: [id number] DOB:12-17-1975, 46 y.o., female Today's Date: 01/16/2022   PT End of Session - 01/16/22 1100     Visit Number 1    Date for PT Re-Evaluation 04/18/22    Authorization Type [place] med[employer]    PT Start Time 1100    PT Stop Time 1140    PT Time Calculation (min) 40 min    Activity Tolerance Patient tolerated treatment well    Behavior During Therapy [facility] for tasks assessed/performed             Past Medical History:  Diagnosis Date   Abnormal Pap smear of cervix    Cervical cancer ([place])    removed in July 2009   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   R mid sole superior moderate atypia    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   R med sole inferior moderate atypia    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   R dorsum lat great toe   Dysplastic nevus 06/11/2020   left prox lat thigh sup, - severe   Dysplastic nevus 06/11/2020   left prox lat thigh inf, - moderate   Dysplastic nevus 06/11/2020   R mid to upper back 2.0cm lat to spine, Severe atypia   Dysplastic nevus 06/11/2020   R distal post thigh, moderate atypia   Dysplastic nevus 08/23/2020   Right labia - mild   Dysplastic nevus 08/12/2021   R lat neck post, moderate atypia   History of recurrent UTIs    HPV (human papilloma virus) infection    HSV (herpes simplex virus) infection    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[facility])    sacro/coccygeal mass excision   Melanoma ([place]) 02/16/2020   R mid plantar sole, BRESLOW'S DEPTH/MAXIMUM TUMOR THICKNESS: 0.5 MM, CLARK/ANATOMIC LEVEL: II Excised 03/20/2020   Normocytic anemia 08/21/2014   Tethered spinal cord ([place])    Vitamin B 12 deficiency 08/21/2014   Past Surgical History:  Procedure Laterality Date   BACK SURGERY     BLADDER SURGERY     COLOSTOMY     after flex sig as 46 year old.    colostomy reversal     EXPLORATORY LAPAROTOMY     with colostomy placement   LEEP     removal  of cervical cancer     TUBAL LIGATION     Patient Active Problem List   Diagnosis Date Noted   Allergic urticaria 07/02/2020   Food allergy 07/02/2020   Tendinitis of knee 07/02/2020   Vasomotor rhinitis 07/02/2020   Melanoma ([place]) 07/02/2020   Pain in joint of left shoulder 11/08/2019   Cervical radiculopathy 05/24/2019   Trigeminal neuralgia 11/05/2018   Symptomatic states associated with artificial menopause 11/05/2018   Arthralgia of hand, right 11/17/2017   Vitamin D deficiency 02/18/2017   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   Other specified disorders of uterus 10/12/2015   Herpes simplex 06/27/2015   Hyperlipidemia 05/30/2015   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   Normocytic anemia 08/21/2014   Vitamin B 12 deficiency 08/21/2014   Allergic rhinitis 01/19/2014   Condyloma acuminatum 01/09/2014   Chronic pain 08/09/2013   Cervical dysplasia 05/09/2013   Other headache syndrome 05/09/2013   Arthralgia of temporomandibular joint 05/09/2013   Cervical pain 08/25/2012   Pain in joint, pelvic region and thigh 07/16/2012   Tethered spinal cord ([place]) 07/16/2012   Balance disorder 07/16/2012   Personal history of fall 07/16/2012   Neurogenic bladder 06/25/2011   Lipomyelomeningocele  of lumbar region ([place]) 12/23/2010   OTHER SPECIFIED CONGENITAL ANOMALY SPINAL CORD 05/08/2010   UNSPEC CONGN ANOMALY BRAIN SP CORD&NERV SYSTEM 05/08/2010   H N P-LUMBAR 04/23/2010   BACK PAIN 01/28/2010   Papanicolaou smear of cervix with atypical squamous cells cannot exclude high grade squamous intraepithelial lesion (ASC-H) 04/14/1898    PCP: Danelle Berry, NP  REFERRING PROVIDER: Vladimir Crofts, MD  REFERRING DIAG: R10.2 (ICD-10-CM) - Pelvic pain  THERAPY DIAG:  Muscle weakness (generalized) - Plan: PT plan of care cert/re-cert  Other muscle spasm - Plan: PT plan of care cert/re-cert  Abnormal posture - Plan: PT plan of care cert/re-cert  Unspecified lack of coordination - Plan: PT plan of care  cert/re-cert  Rationale for Evaluation and Treatment Rehabilitation  ONSET DATE: worsening over the past couple years  SUBJECTIVE:                                                                                                                                                                                           SUBJECTIVE STATEMENT: Chronic history of pelvic floor dysfunction, rt hip and back and pelvic pain. Pt reports she has had a lot of PT previously and has had PFPT prior as well. Worsening of pain recently and this is main concern for PFPT today.  Pt does regularly need to manually complete bowel evacuation with internal  vaginal pressure and at EAS and self-cath for urination. Does wear pull ups AAT. Does have vaginal dilator (straight in make) but reports she can't get to area of pain now.   Has had multiple falls over time and hit tailbone a lot per pt, x-ray was (-) recently but reports this area has been more painful lately.    PAIN:  Are you having pain? Yes NPRS scale: 10+/10 at worst, wakes at 7-8/10 Pain location: Internal, External, and Rt side of pelvic, low back, right hip.   Pain type: aching, burning, dull, and stabbing  Pain description: constant (but level varies)  Aggravating factors: standing, sitting more than 30 mins Relieving factors: rest, muscle relaxer  PRECAUTIONS: Fall  WEIGHT BEARING RESTRICTIONS No  FALLS:  Has patient fallen in last 6 months? Yes. Number of falls 2  LIVING ENVIRONMENT: Lives with: lives with their family Lives in: House/apartment   OCCUPATION: disabled  PLOF: Independent  PATIENT GOALS to have less pain  PERTINENT HISTORY:  myelomeningocele,  self-catheterizes for neurogenic bladder, Cervical cancer, HPV , recurrent UTIs, HSV , chronic rt hip pain, melanoma sle of Rt foot has been excised but has foot sensory changes, cervical radiculopathy, trigeminal neuralgia, grade 1 carpal tunnel, left mild  chronic C7  radiculopathy,   Sexual abuse: No  BOWEL MOVEMENT Pain with bowel movement: No Type of bowel movement:Type (Bristol Stool Scale) type 1-2 usually, will have diarrhea intermittently but thinks a lot of this is more about constipation and low motility, Frequency empty bowels every time caths, and usually this is enough unless has diarrhea, and Strain No Fully empty rectum: No does not have urge regularly, does have increased pressure in low back.   Leakage: Yes: with diarrhea wears AAT Pads: Yes: pull ups AAT Fiber supplement: No  URINATION Pain with urination: No Fully empty bladder: No does self cath to empty Stream: self-cath Urgency: No has no urge to have urine void Frequency: self caths 6x per day Leakage:  none Pads: No  INTERCOURSE Pain with intercourse:  not painful internally but will have pain at Rt pelvis or hip Ability to have vaginal penetration:  Yes:   Climax: yes not painful Marinoff Scale: 0/3  PREGNANCY Vaginal deliveries 3 Tearing Yes: tearing with first with stiches, tearing with second C-section deliveries 0 Currently pregnant No  PROLAPSE None    OBJECTIVE:   DIAGNOSTIC FINDINGS:    PATIENT SURVEYS:    [id number]   COGNITION:  Overall cognitive status: Within functional limits for tasks assessed     SENSATION:  Light touch: Deficits Rt flank down to toes has decreased sensation, decreased sensation at external genitalis   Proprioception: Deficits Rt side decreased   MUSCLE LENGTH: Rt hamstring and adductor limited by 50%, Lt 25%    GAIT: Distance walked: 250' Assistive device utilized: None Level of assistance: Modified independence Comments: decreased mobility at Rt LE, foot drop Rt foot, decreased flexion of Rt knee, hip circumduction noted with gait at Rt, decreased cadence               POSTURE: rounded shoulders, forward head, and posterior pelvic tilt   LUMBARAROM/PROM  A/PROM A/PROM  eval  Flexion WFL  Extension WFL   Right lateral flexion Limited by 50%  Left lateral flexion Limited by 50%  Right rotation Limited by 50%  Left rotation Limited by 50%   (Blank rows = not tested)  LOWER EXTREMITY ROM:  WFL but tension felt pt pain at Rt side  LOWER EXTREMITY MMT:  MMT Right Eval All out of 5 Left Eval All out of 5  Hip flexion 2 3+  Hip extension 3 4  Hip abduction 2+ 4  Hip adduction 3 4  Hip internal rotation    Hip external rotation    Knee flexion 3+ 4+  Knee extension 2+ 4+  Ankle dorsiflexion 0 4  Ankle plantarflexion 1+ 4  Ankle inversion 1+ 4  Ankle eversion 1+ 4    PALPATION:   General  TTP and tension noted throughout Rt thoracic and lumbar paraspinals, TTP at coccyx, Rt piriformis, throughout gluteal rt                 External Perineal Exam deferred to next session                              Internal Pelvic Floor deferred to next session   Patient confirms identification and approves PT to assess internal pelvic floor and treatment Yes next session   PELVIC MMT:   MMT eval  Vaginal   Internal Anal Sphincter   External Anal Sphincter   Puborectalis   Diastasis Recti   (Blank rows =  not tested)        TONE: deferred to next session   PROLAPSE: deferred to next session   TODAY'S TREATMENT  EVAL Examination completed, findings reviewed, pt educated on POC.  Pt motivated to participate in PT and agreeable to attempt recommendations.     PATIENT EDUCATION:  Education details: to be given Person educated: Patient Education method: Consulting civil engineer, Demonstration, Tactile cues, Verbal cues, and Handouts Education comprehension: verbalized understanding and returned demonstration   HOME EXERCISE PROGRAM: To be given  ASSESSMENT:  CLINICAL IMPRESSION: Patient is a 46 y.o. female  who was seen today for physical therapy evaluation and treatment for pelvic pain. Pt has complex medical history with myelomeningocele,  Cervical cancer, HPV, recurrent UTIs, HSV,  melanoma sle of Rt foot has been excised but has foot sensory changes, cervical radiculopathy, trigeminal neuralgia, grade 1 carpal tunnel, left mild chronic C7 radiculopathy. Pt is I with self cathing for urine voids and manual evacuation for bowels. Pt's largest complaint today is worsening pelvic pain. Pt found to have decreased flexibility in side bending and rotation of spine with pain per pt, decreased rt LE strength due to chronic spinal cord injury myelomeningocele, decreased sensation throughout Rt side from flank into toes and does include sacral area and external genitals. Pt also has TTP throughout Rt gluteal, Rt piriformis, Rt thoracic and lumbar paraspinals with tension in these areas noted, fascial restrictions throughout abdomen, tightness and pain at Rt and midline coccyx. Pt agreeable to internal assessment but limited due to time today with complexity of pt's history and assessment externally. Pt reports she is limited in household tasks, mobility, activity tolerance due to pain. Pt would benefit from additional PT to further address deficits.     OBJECTIVE IMPAIRMENTS decreased activity tolerance, decreased coordination, decreased endurance, decreased mobility, difficulty walking, decreased strength, increased fascial restrictions, increased muscle spasms, impaired flexibility, improper body mechanics, postural dysfunction, and pain.   ACTIVITY LIMITATIONS carrying, lifting, sitting, standing, squatting, stairs, continence, toileting, and locomotion level  PARTICIPATION LIMITATIONS: meal prep, cleaning, interpersonal relationship, driving, shopping, community activity, and yard work  PERSONAL FACTORS Past/current experiences, Time since onset of injury/illness/exacerbation, and 3+ comorbidities: medical history  are also affecting patient's functional outcome.   REHAB POTENTIAL: Good  CLINICAL DECISION MAKING: Evolving/moderate complexity  EVALUATION COMPLEXITY:  Moderate   GOALS: Goals reviewed with patient? Yes  SHORT TERM GOALS: Target date: 02/13/2022  Pt to be I with HEP.  Baseline: Goal status: INITIAL  2.  Pt will report 25% reduction of pain due to improvements in posture, strength, and muscle length  Baseline: 10/10 Goal status: INITIAL  3.  Pt will report her BMs and bladder voids are complete due to improved bowel habits and evacuation techniques.  Baseline:  Goal status: INITIAL   LONG TERM GOALS: Target date:  04/18/22    Pt to be I with advanced HEP.  Baseline:  Goal status: INITIAL  2.  Pt will report 50% reduction of pain due to improvements in posture, strength, and muscle length  Baseline:  Goal status: INITIAL  3.  Pt to be I with pelvic wand/dilator use for pain management outside of PT.  Baseline:  Goal status: INITIAL  4.  Pt to be I with abdominal massage, voiding mechanics, breathing mechanics for improved pelvic relaxation and decreased pain.  Baseline:  Goal status: INITIAL  5.  Pt to demonstrated improved coordination of pelvic floor and breathing mechanics with functional squat with no increase in pain. Baseline:  Goal status: INITIAL  6.  Pt to demonstrate at least 3/5 pelvic floor strength and ability to fully relax post contraction for improved pelvic stability and decreased strain at pelvic floor. Baseline:  Goal status: INITIAL  PLAN: PT FREQUENCY: 1x/week  PT DURATION:  8 sessions  PLANNED INTERVENTIONS: Therapeutic exercises, Therapeutic activity, Neuromuscular re-education, Patient/Family education, Self Care, Joint mobilization, Aquatic Therapy, Dry Needling, Spinal mobilization, Cryotherapy, Moist heat, scar mobilization, Taping, Vasopneumatic device, Biofeedback, and Manual therapy  PLAN FOR NEXT SESSION: internal as needed and pt consents, strengthening at core and hips, coordination of pelvic floor and breathing, education on wand use  Stacy Gardner, PT, DPT 01/17/2311:46 PM

## 2022-02-03 ENCOUNTER — Encounter: Payer: Self-pay | Admitting: *Deleted

## 2022-02-05 ENCOUNTER — Ambulatory Visit: Payer: Medicaid Other | Admitting: Physical Therapy

## 2022-02-05 DIAGNOSIS — M6281 Muscle weakness (generalized): Secondary | ICD-10-CM | POA: Diagnosis not present

## 2022-02-05 DIAGNOSIS — R279 Unspecified lack of coordination: Secondary | ICD-10-CM

## 2022-02-05 DIAGNOSIS — M62838 Other muscle spasm: Secondary | ICD-10-CM

## 2022-02-05 NOTE — Therapy (Signed)
OUTPATIENT PHYSICAL THERAPY FEMALE PELVIC TREATMENT   Patient Name: Melissa Pruitt MRN: [id number] DOB:January 07, 1976, 46 y.o., female Today's Date: 02/05/2022   PT End of Session - 02/05/22 1103     Visit Number 2    Date for PT Re-Evaluation 04/18/22    Authorization Type [place] medicaid [employer]    PT Start Time 1100    PT Stop Time 1139    PT Time Calculation (min) 39 min    Activity Tolerance Patient tolerated treatment well    Behavior During Therapy [facility] for tasks assessed/performed             Past Medical History:  Diagnosis Date   Abnormal Pap smear of cervix    Cervical cancer ([place])    removed in July 2009   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   R mid sole superior moderate atypia    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   R med sole inferior moderate atypia    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   R dorsum lat great toe   Dysplastic nevus 06/11/2020   left prox lat thigh sup, - severe   Dysplastic nevus 06/11/2020   left prox lat thigh inf, - moderate   Dysplastic nevus 06/11/2020   R mid to upper back 2.0cm lat to spine, Severe atypia   Dysplastic nevus 06/11/2020   R distal post thigh, moderate atypia   Dysplastic nevus 08/23/2020   Right labia - mild   Dysplastic nevus 08/12/2021   R lat neck post, moderate atypia   History of recurrent UTIs    HPV (human papilloma virus) infection    HSV (herpes simplex virus) infection    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[facility])    sacro/coccygeal mass excision   Melanoma ([place]) 02/16/2020   R mid plantar sole, BRESLOW'S DEPTH/MAXIMUM TUMOR THICKNESS: 0.5 MM, CLARK/ANATOMIC LEVEL: II Excised 03/20/2020   Normocytic anemia 08/21/2014   Tethered spinal cord ([place])    Vitamin B 12 deficiency 08/21/2014   Past Surgical History:  Procedure Laterality Date   BACK SURGERY     BLADDER SURGERY     COLOSTOMY     after flex sig as 46 year old.    colostomy reversal     EXPLORATORY LAPAROTOMY     with colostomy placement   LEEP     removal  of cervical cancer     TUBAL LIGATION     Patient Active Problem List   Diagnosis Date Noted   Allergic urticaria 07/02/2020   Food allergy 07/02/2020   Tendinitis of knee 07/02/2020   Vasomotor rhinitis 07/02/2020   Melanoma ([place]) 07/02/2020   Pain in joint of left shoulder 11/08/2019   Cervical radiculopathy 05/24/2019   Trigeminal neuralgia 11/05/2018   Symptomatic states associated with artificial menopause 11/05/2018   Arthralgia of hand, right 11/17/2017   Vitamin D deficiency 02/18/2017   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   Other specified disorders of uterus 10/12/2015   Herpes simplex 06/27/2015   Hyperlipidemia 05/30/2015   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   Normocytic anemia 08/21/2014   Vitamin B 12 deficiency 08/21/2014   Allergic rhinitis 01/19/2014   Condyloma acuminatum 01/09/2014   Chronic pain 08/09/2013   Cervical dysplasia 05/09/2013   Other headache syndrome 05/09/2013   Arthralgia of temporomandibular joint 05/09/2013   Cervical pain 08/25/2012   Pain in joint, pelvic region and thigh 07/16/2012   Tethered spinal cord ([place]) 07/16/2012   Balance disorder 07/16/2012   Personal history of fall 07/16/2012   Neurogenic bladder 06/25/2011   Lipomyelomeningocele  of lumbar region ([place]) 12/23/2010   OTHER SPECIFIED CONGENITAL ANOMALY SPINAL CORD 05/08/2010   UNSPEC CONGN ANOMALY BRAIN SP CORD&NERV SYSTEM 05/08/2010   H N P-LUMBAR 04/23/2010   BACK PAIN 01/28/2010   Papanicolaou smear of cervix with atypical squamous cells cannot exclude high grade squamous intraepithelial lesion (ASC-H) 04/14/1898    PCP: Danelle Berry, NP  REFERRING PROVIDER: Vladimir Crofts, MD  REFERRING DIAG: R10.2 (ICD-10-CM) - Pelvic pain  THERAPY DIAG:  Other muscle spasm  Muscle weakness (generalized)  Unspecified lack of coordination  Rationale for Evaluation and Treatment Rehabilitation  ONSET DATE: worsening over the past couple years  SUBJECTIVE:                                                                                                                                                                                            SUBJECTIVE STATEMENT: Pt reports pain and symptoms are about the same since last eval.   Has had multiple falls over time and hit tailbone a lot per pt, x-ray was (-) recently but reports this area has been more painful lately.    PAIN:  Are you having pain? Yes NPRS scale: 7-8/10 Pain location: Internal, External, and Rt side of pelvic, low back, right hip.   Pain type: aching, burning, dull, and stabbing  Pain description: constant (but level varies)  Aggravating factors: standing, sitting more than 30 mins Relieving factors: rest, muscle relaxer  PRECAUTIONS: Fall  WEIGHT BEARING RESTRICTIONS No  FALLS:  Has patient fallen in last 6 months? Yes. Number of falls 2  LIVING ENVIRONMENT: Lives with: lives with their family Lives in: House/apartment   OCCUPATION: disabled  PLOF: Independent  PATIENT GOALS to have less pain  PERTINENT HISTORY:  myelomeningocele,  self-catheterizes for neurogenic bladder, Cervical cancer, HPV , recurrent UTIs, HSV , chronic rt hip pain, melanoma sle of Rt foot has been excised but has foot sensory changes, cervical radiculopathy, trigeminal neuralgia, grade 1 carpal tunnel, left mild chronic C7 radiculopathy,   Sexual abuse: No  BOWEL MOVEMENT Pain with bowel movement: No Type of bowel movement:Type (Bristol Stool Scale) type 1-2 usually, will have diarrhea intermittently but thinks a lot of this is more about constipation and low motility, Frequency empty bowels every time caths, and usually this is enough unless has diarrhea, and Strain No Fully empty rectum: No does not have urge regularly, does have increased pressure in low back.   Leakage: Yes: with diarrhea wears AAT Pads: Yes: pull ups AAT Fiber supplement: No  URINATION Pain with urination: No Fully empty bladder: No does  self cath to empty Stream: self-cath  Urgency: No has no urge to have urine void Frequency: self caths 6x per day Leakage:  none Pads: No  INTERCOURSE Pain with intercourse:  not painful internally but will have pain at Rt pelvis or hip Ability to have vaginal penetration:  Yes:   Climax: yes not painful Marinoff Scale: 0/3  PREGNANCY Vaginal deliveries 3 Tearing Yes: tearing with first with stiches, tearing with second C-section deliveries 0 Currently pregnant No  PROLAPSE None    OBJECTIVE:   DIAGNOSTIC FINDINGS:    PATIENT SURVEYS:    [id number]   COGNITION:  Overall cognitive status: Within functional limits for tasks assessed     SENSATION:  Light touch: Deficits Rt flank down to toes has decreased sensation, decreased sensation at external genitalis   Proprioception: Deficits Rt side decreased   MUSCLE LENGTH: Rt hamstring and adductor limited by 50%, Lt 25%    GAIT: Distance walked: 250' Assistive device utilized: None Level of assistance: Modified independence Comments: decreased mobility at Rt LE, foot drop Rt foot, decreased flexion of Rt knee, hip circumduction noted with gait at Rt, decreased cadence               POSTURE: rounded shoulders, forward head, and posterior pelvic tilt   LUMBARAROM/PROM  A/PROM A/PROM  eval  Flexion WFL  Extension WFL  Right lateral flexion Limited by 50%  Left lateral flexion Limited by 50%  Right rotation Limited by 50%  Left rotation Limited by 50%   (Blank rows = not tested)  LOWER EXTREMITY ROM:  WFL but tension felt pt pain at Rt side  LOWER EXTREMITY MMT:  MMT Right Eval All out of 5 Left Eval All out of 5  Hip flexion 2 3+  Hip extension 3 4  Hip abduction 2+ 4  Hip adduction 3 4  Hip internal rotation    Hip external rotation    Knee flexion 3+ 4+  Knee extension 2+ 4+  Ankle dorsiflexion 0 4  Ankle plantarflexion 1+ 4  Ankle inversion 1+ 4  Ankle eversion 1+ 4    PALPATION:    General  TTP and tension noted throughout Rt thoracic and lumbar paraspinals, TTP at coccyx, Rt piriformis, throughout gluteal rt                 External Perineal Exam deferred to next session                              Internal Pelvic Floor deferred to next session   Patient confirms identification and approves PT to assess internal pelvic floor and treatment Yes next session   PELVIC MMT:   MMT eval  Vaginal 0/5, pt also has decreased internally at lt pelvic floor and unable to feel well for contraction.   Internal Anal Sphincter   External Anal Sphincter   Puborectalis   Diastasis Recti   (Blank rows = not tested)        TONE: deferred to next session  02/05/22 - decreased   PROLAPSE: deferred to next session  02/05/22 - anterior vaginal wall laxity possibly grade 2 noted in hooklying with strong cough   TODAY'S TREATMENT  02/05/22  No emotional/communication barriers or cognitive limitation. Patient is motivated to learn. Patient understands and agrees with treatment goals and plan. PT explains patient will be examined in standing, sitting, and lying down to see how their muscles and joints work. When they are ready,  they will be asked to remove their underwear so PT can examine their perineum. The patient is also given the option of providing their own chaperone as one is not provided in our facility. The patient also has the right and is explained the right to defer or refuse any part of the evaluation or treatment including the internal exam. With the patient's consent, PT will use one gloved finger to gently assess the muscles of the pelvic floor, seeing how well it contracts and relaxes and if there is muscle symmetry. After, the patient will get dressed and PT and patient will discuss exam findings and plan of care. PT and patient discuss plan of care, schedule, attendance policy and HEP activities.  Manual: Pt consented to internal vaginal assessment this date and found to  have decreased strength, coordination, and endurance.  Pt limited in contraction of pelvic floor due poor sensation and motor control with history of myelomeningocele and SCI associated with this. Pt reports she has better sensation at Lt side of pelvic floor but no sensation at Rt other than deep pressure and referred sensation to Rt quad. Pt did have two trigger points one at Rt obturator and Rt iliococcygeus both released well and pt reported with this treatment her pain lowered from 7-8/10 to 4-5/10.  Externally: pt did have TTP throughout Rt gluteal with increased tension throughout Rt gluteal, rt lumbar paraspinal, and rt hamstring.  Manual externally: pt dressed and manual work at rt gluteal, hamstring, and low back with addaday completed. Pt reported this further decreased her pain to 3-4/10.  Pt educated on stretches for home of forward hinge stretch with bil hip IR, pigeon pose. Pt demonstrated good technique with one rep during session for carry over.    PATIENT EDUCATION:  Education details: to be given Person educated: Patient Education method: Consulting civil engineer, Demonstration, Tactile cues, Verbal cues, and Handouts Education comprehension: verbalized understanding and returned demonstration   HOME EXERCISE PROGRAM: To be given  ASSESSMENT:  CLINICAL IMPRESSION: Patient session focused on internal pelvic floor assessment, findings above. Manual work internally completed for trigger point release completed with pt reported lowered pain post this. External manual work with addaday used at Rt hip and low back. Pt tolerated well and reported end session pain 3/10. Pt pleased with progress today and reports this has been helpful for her. Pt would benefit from additional PT to further address deficits.     OBJECTIVE IMPAIRMENTS decreased activity tolerance, decreased coordination, decreased endurance, decreased mobility, difficulty walking, decreased strength, increased fascial restrictions,  increased muscle spasms, impaired flexibility, improper body mechanics, postural dysfunction, and pain.   ACTIVITY LIMITATIONS carrying, lifting, sitting, standing, squatting, stairs, continence, toileting, and locomotion level  PARTICIPATION LIMITATIONS: meal prep, cleaning, interpersonal relationship, driving, shopping, community activity, and yard work  PERSONAL FACTORS Past/current experiences, Time since onset of injury/illness/exacerbation, and 3+ comorbidities: medical history  are also affecting patient's functional outcome.   REHAB POTENTIAL: Good  CLINICAL DECISION MAKING: Evolving/moderate complexity  EVALUATION COMPLEXITY: Moderate   GOALS: Goals reviewed with patient? Yes  SHORT TERM GOALS: Target date: 02/13/2022  Pt to be I with HEP.  Baseline: Goal status: INITIAL  2.  Pt will report 25% reduction of pain due to improvements in posture, strength, and muscle length  Baseline: 10/10 Goal status: INITIAL  3.  Pt will report her BMs and bladder voids are complete due to improved bowel habits and evacuation techniques.  Baseline:  Goal status: INITIAL   LONG TERM GOALS: Target  date:  04/18/22    Pt to be I with advanced HEP.  Baseline:  Goal status: INITIAL  2.  Pt will report 50% reduction of pain due to improvements in posture, strength, and muscle length  Baseline:  Goal status: INITIAL  3.  Pt to be I with pelvic wand/dilator use for pain management outside of PT.  Baseline:  Goal status: INITIAL  4.  Pt to be I with abdominal massage, voiding mechanics, breathing mechanics for improved pelvic relaxation and decreased pain.  Baseline:  Goal status: INITIAL  5.  Pt to demonstrated improved coordination of pelvic floor and breathing mechanics with functional squat with no increase in pain. Baseline:  Goal status: INITIAL  6.  Pt to demonstrate at least 3/5 pelvic floor strength and ability to fully relax post contraction for improved pelvic stability  and decreased strain at pelvic floor. Baseline:  Goal status: INITIAL  PLAN: PT FREQUENCY: 1x/week  PT DURATION:  8 sessions  PLANNED INTERVENTIONS: Therapeutic exercises, Therapeutic activity, Neuromuscular re-education, Patient/Family education, Self Care, Joint mobilization, Aquatic Therapy, Dry Needling, Spinal mobilization, Cryotherapy, Moist heat, scar mobilization, Taping, Vasopneumatic device, Biofeedback, and Manual therapy  PLAN FOR NEXT SESSION: internal as needed and pt consents, strengthening at core and hips, coordination of pelvic floor and breathing, education on wand use  Stacy Gardner, PT, DPT 02/05/2310:47 AM

## 2022-02-06 ENCOUNTER — Ambulatory Visit: Payer: Medicaid Other | Admitting: Physical Therapy

## 2022-02-12 ENCOUNTER — Ambulatory Visit (HOSPITAL_COMMUNITY)
Admission: RE | Admit: 2022-02-12 | Discharge: 2022-02-12 | Disposition: A | Discharge status: Home / Self Care | Payer: Medicaid Other | Source: Ambulatory Visit | Attending: Obstetrics and Gynecology | Admitting: Obstetrics and Gynecology

## 2022-02-12 DIAGNOSIS — N319 Neuromuscular dysfunction of bladder, unspecified: Secondary | ICD-10-CM | POA: Diagnosis present

## 2022-02-13 ENCOUNTER — Ambulatory Visit: Payer: Medicaid Other | Attending: Neurology | Admitting: Physical Therapy

## 2022-02-13 DIAGNOSIS — M6281 Muscle weakness (generalized): Secondary | ICD-10-CM | POA: Diagnosis not present

## 2022-02-13 DIAGNOSIS — M62838 Other muscle spasm: Secondary | ICD-10-CM | POA: Diagnosis present

## 2022-02-13 DIAGNOSIS — R293 Abnormal posture: Secondary | ICD-10-CM | POA: Diagnosis present

## 2022-02-13 DIAGNOSIS — R279 Unspecified lack of coordination: Secondary | ICD-10-CM | POA: Diagnosis present

## 2022-02-13 NOTE — Therapy (Signed)
OUTPATIENT PHYSICAL THERAPY FEMALE PELVIC TREATMENT   Patient Name: Melissa Pruitt MRN: [id number] DOB:02/07/1976, 46 y.o., female Today's Date: 02/13/2022   PT End of Session - 02/13/22 1105     Visit Number 3    Date for PT Re-Evaluation 04/18/22    Authorization Type [place] medicaid [employer]    PT Start Time 1100    PT Stop Time 1140    PT Time Calculation (min) 40 min    Activity Tolerance Patient tolerated treatment well    Behavior During Therapy [facility] for tasks assessed/performed             Past Medical History:  Diagnosis Date   Abnormal Pap smear of cervix    Cervical cancer ([place])    removed in July 2009   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   R mid sole superior moderate atypia    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   R med sole inferior moderate atypia    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   R dorsum lat great toe   Dysplastic nevus 06/11/2020   left prox lat thigh sup, - severe   Dysplastic nevus 06/11/2020   left prox lat thigh inf, - moderate   Dysplastic nevus 06/11/2020   R mid to upper back 2.0cm lat to spine, Severe atypia   Dysplastic nevus 06/11/2020   R distal post thigh, moderate atypia   Dysplastic nevus 08/23/2020   Right labia - mild   Dysplastic nevus 08/12/2021   R lat neck post, moderate atypia   History of recurrent UTIs    HPV (human papilloma virus) infection    HSV (herpes simplex virus) infection    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[facility])    sacro/coccygeal mass excision   Melanoma ([place]) 02/16/2020   R mid plantar sole, BRESLOW'S DEPTH/MAXIMUM TUMOR THICKNESS: 0.5 MM, CLARK/ANATOMIC LEVEL: II Excised 03/20/2020   Normocytic anemia 08/21/2014   Tethered spinal cord ([place])    Vitamin B 12 deficiency 08/21/2014   Past Surgical History:  Procedure Laterality Date   BACK SURGERY     BLADDER SURGERY     COLOSTOMY     after flex sig as 46 year old.    colostomy reversal     EXPLORATORY LAPAROTOMY     with colostomy placement   LEEP     removal  of cervical cancer     TUBAL LIGATION     Patient Active Problem List   Diagnosis Date Noted   Allergic urticaria 07/02/2020   Food allergy 07/02/2020   Tendinitis of knee 07/02/2020   Vasomotor rhinitis 07/02/2020   Melanoma ([place]) 07/02/2020   Pain in joint of left shoulder 11/08/2019   Cervical radiculopathy 05/24/2019   Trigeminal neuralgia 11/05/2018   Symptomatic states associated with artificial menopause 11/05/2018   Arthralgia of hand, right 11/17/2017   Vitamin D deficiency 02/18/2017   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   Other specified disorders of uterus 10/12/2015   Herpes simplex 06/27/2015   Hyperlipidemia 05/30/2015   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   Normocytic anemia 08/21/2014   Vitamin B 12 deficiency 08/21/2014   Allergic rhinitis 01/19/2014   Condyloma acuminatum 01/09/2014   Chronic pain 08/09/2013   Cervical dysplasia 05/09/2013   Other headache syndrome 05/09/2013   Arthralgia of temporomandibular joint 05/09/2013   Cervical pain 08/25/2012   Pain in joint, pelvic region and thigh 07/16/2012   Tethered spinal cord ([place]) 07/16/2012   Balance disorder 07/16/2012   Personal history of fall 07/16/2012   Neurogenic bladder 06/25/2011   Lipomyelomeningocele  of lumbar region ([place]) 12/23/2010   OTHER SPECIFIED CONGENITAL ANOMALY SPINAL CORD 05/08/2010   UNSPEC CONGN ANOMALY BRAIN SP CORD&NERV SYSTEM 05/08/2010   H N P-LUMBAR 04/23/2010   BACK PAIN 01/28/2010   Papanicolaou smear of cervix with atypical squamous cells cannot exclude high grade squamous intraepithelial lesion (ASC-H) 04/14/1898    PCP: Danelle Berry, NP  REFERRING PROVIDER: Vladimir Crofts, MD  REFERRING DIAG: R10.2 (ICD-10-CM) - Pelvic pain  THERAPY DIAG:  Muscle weakness (generalized)  Other muscle spasm  Unspecified lack of coordination  Abnormal posture  Rationale for Evaluation and Treatment Rehabilitation  ONSET DATE: worsening over the past couple years  SUBJECTIVE:                                                                                                                                                                                            SUBJECTIVE STATEMENT: Pt reports low back and Rt hip pain after lifting a lot of boxes on Saturday but has been improving since then with stretching.  Pt reports manual last session really helped afterward and would like this again today.   PAIN:  Are you having pain? Yes NPRS scale: 7-810 Pain location: Internal, External, and Rt side of pelvic, low back, right hip.   Pain type: aching, burning, dull, and stabbing  Pain description: constant (but level varies)  Aggravating factors: standing, sitting more than 30 mins Relieving factors: rest, muscle relaxer  PRECAUTIONS: Fall  WEIGHT BEARING RESTRICTIONS No  FALLS:  Has patient fallen in last 6 months? Yes. Number of falls 2  LIVING ENVIRONMENT: Lives with: lives with their family Lives in: House/apartment   OCCUPATION: disabled  PLOF: Independent  PATIENT GOALS to have less pain  PERTINENT HISTORY:  myelomeningocele,  self-catheterizes for neurogenic bladder, Cervical cancer, HPV , recurrent UTIs, HSV , chronic rt hip pain, melanoma sle of Rt foot has been excised but has foot sensory changes, cervical radiculopathy, trigeminal neuralgia, grade 1 carpal tunnel, left mild chronic C7 radiculopathy,   Sexual abuse: No  BOWEL MOVEMENT Pain with bowel movement: No Type of bowel movement:Type (Bristol Stool Scale) type 1-2 usually, will have diarrhea intermittently but thinks a lot of this is more about constipation and low motility, Frequency empty bowels every time caths, and usually this is enough unless has diarrhea, and Strain No Fully empty rectum: No does not have urge regularly, does have increased pressure in low back.   Leakage: Yes: with diarrhea wears AAT Pads: Yes: pull ups AAT Fiber supplement: No  URINATION Pain with urination:  No Fully empty bladder: No does self cath to empty Stream:  self-cath Urgency: No has no urge to have urine void Frequency: self caths 6x per day Leakage:  none Pads: No  INTERCOURSE Pain with intercourse:  not painful internally but will have pain at Rt pelvis or hip Ability to have vaginal penetration:  Yes:   Climax: yes not painful Marinoff Scale: 0/3  PREGNANCY Vaginal deliveries 3 Tearing Yes: tearing with first with stiches, tearing with second C-section deliveries 0 Currently pregnant No  PROLAPSE None    OBJECTIVE:   DIAGNOSTIC FINDINGS:    PATIENT SURVEYS:    [id number]   COGNITION:  Overall cognitive status: Within functional limits for tasks assessed     SENSATION:  Light touch: Deficits Rt flank down to toes has decreased sensation, decreased sensation at external genitalis   Proprioception: Deficits Rt side decreased   MUSCLE LENGTH: Rt hamstring and adductor limited by 50%, Lt 25%    GAIT: Distance walked: 250' Assistive device utilized: None Level of assistance: Modified independence Comments: decreased mobility at Rt LE, foot drop Rt foot, decreased flexion of Rt knee, hip circumduction noted with gait at Rt, decreased cadence               POSTURE: rounded shoulders, forward head, and posterior pelvic tilt   LUMBARAROM/PROM  A/PROM A/PROM  eval  Flexion WFL  Extension WFL  Right lateral flexion Limited by 50%  Left lateral flexion Limited by 50%  Right rotation Limited by 50%  Left rotation Limited by 50%   (Blank rows = not tested)  LOWER EXTREMITY ROM:  WFL but tension felt pt pain at Rt side  LOWER EXTREMITY MMT:  MMT Right Eval All out of 5 Left Eval All out of 5  Hip flexion 2 3+  Hip extension 3 4  Hip abduction 2+ 4  Hip adduction 3 4  Hip internal rotation    Hip external rotation    Knee flexion 3+ 4+  Knee extension 2+ 4+  Ankle dorsiflexion 0 4  Ankle plantarflexion 1+ 4  Ankle inversion 1+ 4  Ankle  eversion 1+ 4    PALPATION:   General  TTP and tension noted throughout Rt thoracic and lumbar paraspinals, TTP at coccyx, Rt piriformis, throughout gluteal rt                 External Perineal Exam deferred to next session                              Internal Pelvic Floor deferred to next session   Patient confirms identification and approves PT to assess internal pelvic floor and treatment Yes next session   PELVIC MMT:   MMT eval  Vaginal 0/5, pt also has decreased internally at lt pelvic floor and unable to feel well for contraction.   Internal Anal Sphincter   External Anal Sphincter   Puborectalis   Diastasis Recti   (Blank rows = not tested)        TONE: deferred to next session  02/05/22 - decreased   PROLAPSE: deferred to next session  02/05/22 - anterior vaginal wall laxity possibly grade 2 noted in hooklying with strong cough   TODAY'S TREATMENT   02/13/22: Pt consented to vaginal treatment today, upon treatment noted anterior wall laxity at introitus and pt reports she thought this would be worse today, she did a lot of lifting and moving boxes for a yard sale over the weekend and has had  a lot of pain since then. Treatment consisted of manual internal work at Rt side of pelvic floor Rt obturator and Rt iliococcygeus and rt pubococcygeus with multiple trigger points. And pt reports she doesn't feel the sensation well on rt however does feel the tension/pulsating/and it refers to quad and sometimes foot per pt. Pt reports this is where she felt so much discomfort over the weekend. Release work completed here and gentle pressure throughout with good release of all trigger points, progressed to gentle stretching. Pt tolerated well and reported decreased pain from 8/10 > 5/10.  Manual with addaday in Lt sidelying at Rt hip/hamstring region extra time spent at rt piriformis and proximal abductor. Pt reports 5/10> to 3/10 pain post this.  During manual pt educated on  prolapse relief positions for improved pt comfort as needed for 10 mins. Pt verbalized understanding.   02/05/22  No emotional/communication barriers or cognitive limitation. Patient is motivated to learn. Patient understands and agrees with treatment goals and plan. PT explains patient will be examined in standing, sitting, and lying down to see how their muscles and joints work. When they are ready, they will be asked to remove their underwear so PT can examine their perineum. The patient is also given the option of providing their own chaperone as one is not provided in our facility. The patient also has the right and is explained the right to defer or refuse any part of the evaluation or treatment including the internal exam. With the patient's consent, PT will use one gloved finger to gently assess the muscles of the pelvic floor, seeing how well it contracts and relaxes and if there is muscle symmetry. After, the patient will get dressed and PT and patient will discuss exam findings and plan of care. PT and patient discuss plan of care, schedule, attendance policy and HEP activities.  Manual: Pt consented to internal vaginal assessment this date and found to have decreased strength, coordination, and endurance.  Pt limited in contraction of pelvic floor due poor sensation and motor control with history of myelomeningocele and SCI associated with this. Pt reports she has better sensation at Lt side of pelvic floor but no sensation at Rt other than deep pressure and referred sensation to Rt quad. Pt did have two trigger points one at Rt obturator and Rt iliococcygeus both released well and pt reported with this treatment her pain lowered from 7-8/10 to 4-5/10.  Externally: pt did have TTP throughout Rt gluteal with increased tension throughout Rt gluteal, rt lumbar paraspinal, and rt hamstring.  Manual externally: pt dressed and manual work at rt gluteal, hamstring, and low back with addaday completed. Pt  reported this further decreased her pain to 3-4/10.  Pt educated on stretches for home of forward hinge stretch with bil hip IR, pigeon pose. Pt demonstrated good technique with one rep during session for carry over.    PATIENT EDUCATION:  Education details: to be given Person educated: Patient Education method: Education officer, environmental, Tactile cues, Verbal cues, and Handouts Education comprehension: verbalized understanding and returned demonstration   HOME EXERCISE PROGRAM: To be given  ASSESSMENT:  CLINICAL IMPRESSION: Patient session focused on internal pelvic floor treatment, manual work internally completed for trigger point release completed with pt reported lowered pain post this. External manual work with addaday used at Rt hip. Pt tolerated well and reported end session pain 3/10. Pt pleased with progress today and reports this has been helpful for her. Pt also educated on prolapse relief  positions for comfort at home as well and decreased strain at pelvic floor.  Pt would benefit from additional PT to further address deficits.     OBJECTIVE IMPAIRMENTS decreased activity tolerance, decreased coordination, decreased endurance, decreased mobility, difficulty walking, decreased strength, increased fascial restrictions, increased muscle spasms, impaired flexibility, improper body mechanics, postural dysfunction, and pain.   ACTIVITY LIMITATIONS carrying, lifting, sitting, standing, squatting, stairs, continence, toileting, and locomotion level  PARTICIPATION LIMITATIONS: meal prep, cleaning, interpersonal relationship, driving, shopping, community activity, and yard work  PERSONAL FACTORS Past/current experiences, Time since onset of injury/illness/exacerbation, and 3+ comorbidities: medical history  are also affecting patient's functional outcome.   REHAB POTENTIAL: Good  CLINICAL DECISION MAKING: Evolving/moderate complexity  EVALUATION COMPLEXITY:  Moderate   GOALS: Goals reviewed with patient? Yes  SHORT TERM GOALS: Target date: 02/13/2022  Pt to be I with HEP.  Baseline: Goal status: INITIAL  2.  Pt will report 25% reduction of pain due to improvements in posture, strength, and muscle length  Baseline: 10/10 Goal status: INITIAL  3.  Pt will report her BMs and bladder voids are complete due to improved bowel habits and evacuation techniques.  Baseline:  Goal status: INITIAL   LONG TERM GOALS: Target date:  04/18/22    Pt to be I with advanced HEP.  Baseline:  Goal status: INITIAL  2.  Pt will report 50% reduction of pain due to improvements in posture, strength, and muscle length  Baseline:  Goal status: INITIAL  3.  Pt to be I with pelvic wand/dilator use for pain management outside of PT.  Baseline:  Goal status: INITIAL  4.  Pt to be I with abdominal massage, voiding mechanics, breathing mechanics for improved pelvic relaxation and decreased pain.  Baseline:  Goal status: INITIAL  5.  Pt to demonstrated improved coordination of pelvic floor and breathing mechanics with functional squat with no increase in pain. Baseline:  Goal status: INITIAL  6.  Pt to demonstrate at least 3/5 pelvic floor strength and ability to fully relax post contraction for improved pelvic stability and decreased strain at pelvic floor. Baseline:  Goal status: INITIAL  PLAN: PT FREQUENCY: 1x/week  PT DURATION:  8 sessions  PLANNED INTERVENTIONS: Therapeutic exercises, Therapeutic activity, Neuromuscular re-education, Patient/Family education, Self Care, Joint mobilization, Aquatic Therapy, Dry Needling, Spinal mobilization, Cryotherapy, Moist heat, scar mobilization, Taping, Vasopneumatic device, Biofeedback, and Manual therapy  PLAN FOR NEXT SESSION: internal as needed and pt consents, strengthening at core and hips, coordination of pelvic floor and breathing, education on wand use  Stacy Gardner, PT, DPT 02/14/2311:02 PM

## 2022-02-20 ENCOUNTER — Ambulatory Visit: Payer: Medicaid Other | Admitting: Physical Therapy

## 2022-02-20 DIAGNOSIS — R279 Unspecified lack of coordination: Secondary | ICD-10-CM

## 2022-02-20 DIAGNOSIS — M6281 Muscle weakness (generalized): Secondary | ICD-10-CM

## 2022-02-20 DIAGNOSIS — R293 Abnormal posture: Secondary | ICD-10-CM

## 2022-02-20 DIAGNOSIS — M62838 Other muscle spasm: Secondary | ICD-10-CM

## 2022-02-20 NOTE — Therapy (Signed)
OUTPATIENT PHYSICAL THERAPY FEMALE PELVIC TREATMENT   Patient Name: Melissa Pruitt MRN: [id number] DOB:26-Mar-1976, 46 y.o., female Today's Date: 02/20/2022   PT End of Session - 02/20/22 1152     Visit Number 4    Date for PT Re-Evaluation 04/18/22    Authorization Type [place] [employer]    PT Start Time 1101    PT Stop Time 1142    PT Time Calculation (min) 41 min    Activity Tolerance Patient tolerated treatment well    Behavior During Therapy [facility] for tasks assessed/performed              Past Medical History:  Diagnosis Date   Abnormal Pap smear of cervix    Cervical cancer ([place])    removed in July 2009   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   R mid sole superior moderate atypia    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   R med sole inferior moderate atypia    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   R dorsum lat great toe   Dysplastic nevus 06/11/2020   left prox lat thigh sup, - severe   Dysplastic nevus 06/11/2020   left prox lat thigh inf, - moderate   Dysplastic nevus 06/11/2020   R mid to upper back 2.0cm lat to spine, Severe atypia   Dysplastic nevus 06/11/2020   R distal post thigh, moderate atypia   Dysplastic nevus 08/23/2020   Right labia - mild   Dysplastic nevus 08/12/2021   R lat neck post, moderate atypia   History of recurrent UTIs    HPV (human papilloma virus) infection    HSV (herpes simplex virus) infection    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[facility])    sacro/coccygeal mass excision   Melanoma ([place]) 02/16/2020   R mid plantar sole, BRESLOW'S DEPTH/MAXIMUM TUMOR THICKNESS: 0.5 MM, [place]ARK/ANATOMIC LEVEL: II Excised 03/20/2020   Normocytic anemia 08/21/2014   Tethered spinal cord ([place])    Vitamin B 12 deficiency 08/21/2014   Past Surgical History:  Procedure Laterality Date   BACK SURGERY     BLADDER SURGERY     COLOSTOMY     after flex sig as 46 year old.    colostomy reversal     EXPLORATORY LAPAROTOMY     with colostomy placement   LEEP     removal  of cervical cancer     TUBAL LIGATION     Patient Active Problem List   Diagnosis Date Noted   Allergic urticaria 07/02/2020   Food allergy 07/02/2020   Tendinitis of knee 07/02/2020   Vasomotor rhinitis 07/02/2020   Melanoma ([place]) 07/02/2020   Pain in joint of left shoulder 11/08/2019   Cervical radiculopathy 05/24/2019   Trigeminal neuralgia 11/05/2018   Symptomatic states associated with artificial menopause 11/05/2018   Arthralgia of hand, right 11/17/2017   Vitamin D deficiency 02/18/2017   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   Other specified disorders of uterus 10/12/2015   Herpes simplex 06/27/2015   Hyperlipidemia 05/30/2015   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   Normocytic anemia 08/21/2014   Vitamin B 12 deficiency 08/21/2014   Allergic rhinitis 01/19/2014   Condyloma acuminatum 01/09/2014   Chronic pain 08/09/2013   Cervical dysplasia 05/09/2013   Other headache syndrome 05/09/2013   Arthralgia of temporomandibular joint 05/09/2013   Cervical pain 08/25/2012   Pain in joint, pelvic region and thigh 07/16/2012   Tethered spinal cord ([place]) 07/16/2012   Balance disorder 07/16/2012   Personal history of fall 07/16/2012   Neurogenic bladder 06/25/2011  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   OTHER SPECIFIED CONGENITAL ANOMALY SPINAL CORD 05/08/2010   UNSPEC CONGN ANOMALY BRAIN SP CORD&NERV SYSTEM 05/08/2010   H N P-LUMBAR 04/23/2010   BACK PAIN 01/28/2010   Papanicolaou smear of cervix with atypical squamous cells cannot exclude high grade squamous intraepithelial lesion (ASC-H) 04/14/1898    PCP: Danelle Berry, NP  REFERRING PROVIDER: Vladimir Crofts, MD  REFERRING DIAG: R10.2 (ICD-10-CM) - Pelvic pain  THERAPY DIAG:  Muscle weakness (generalized)  Other muscle spasm  Abnormal posture  Unspecified lack of coordination  Rationale for Evaluation and Treatment Rehabilitation  ONSET DATE: worsening over the past couple years  SUBJECTIVE:                                                                                                                                                                                            SUBJECTIVE STATEMENT: Pt reports she felt great after last session with manual but then next day and days following had a lot of tightness and increase pain.   PAIN:  Are you having pain? Yes NPRS scale: 9/10 Pain location: Internal, External, and Rt side of pelvic, low back, right hip.   Pain type: aching, burning, dull, and stabbing  Pain description: constant (but level varies)  Aggravating factors: standing, sitting more than 30 mins Relieving factors: rest, muscle relaxer  PRECAUTIONS: Fall  WEIGHT BEARING RESTRICTIONS No  FALLS:  Has patient fallen in last 6 months? Yes. Number of falls 2  LIVING ENVIRONMENT: Lives with: lives with their family Lives in: House/apartment   OCCUPATION: disabled  PLOF: Independent  PATIENT GOALS to have less pain  PERTINENT HISTORY:  myelomeningocele,  self-catheterizes for neurogenic bladder, Cervical cancer, HPV , recurrent UTIs, HSV , chronic rt hip pain, melanoma sle of Rt foot has been excised but has foot sensory changes, cervical radiculopathy, trigeminal neuralgia, grade 1 carpal tunnel, left mild chronic C7 radiculopathy,   Sexual abuse: No  BOWEL MOVEMENT Pain with bowel movement: No Type of bowel movement:Type (Bristol Stool Scale) type 1-2 usually, will have diarrhea intermittently but thinks a lot of this is more about constipation and low motility, Frequency empty bowels every time caths, and usually this is enough unless has diarrhea, and Strain No Fully empty rectum: No does not have urge regularly, does have increased pressure in low back.   Leakage: Yes: with diarrhea wears AAT Pads: Yes: pull ups AAT Fiber supplement: No  URINATION Pain with urination: No Fully empty bladder: No does self cath to empty Stream: self-cath Urgency: No has no  urge to have urine void Frequency: self caths  6x per day Leakage:  none Pads: No  INTERCOURSE Pain with intercourse:  not painful internally but will have pain at Rt pelvis or hip Ability to have vaginal penetration:  Yes:   Climax: yes not painful Marinoff Scale: 0/3  PREGNANCY Vaginal deliveries 3 Tearing Yes: tearing with first with stiches, tearing with second C-section deliveries 0 Currently pregnant No  PROLAPSE None    OBJECTIVE:   DIAGNOSTIC FINDINGS:    PATIENT SURVEYS:    [id number]   COGNITION:  Overall cognitive status: Within functional limits for tasks assessed     SENSATION:  Light touch: Deficits Rt flank down to toes has decreased sensation, decreased sensation at external genitalis   Proprioception: Deficits Rt side decreased   MUSCLE LENGTH: Rt hamstring and adductor limited by 50%, Lt 25%    GAIT: Distance walked: 250' Assistive device utilized: None Level of assistance: Modified independence Comments: decreased mobility at Rt LE, foot drop Rt foot, decreased flexion of Rt knee, hip circumduction noted with gait at Rt, decreased cadence               POSTURE: rounded shoulders, forward head, and posterior pelvic tilt   LUMBARAROM/PROM  A/PROM A/PROM  eval  Flexion WFL  Extension WFL  Right lateral flexion Limited by 50%  Left lateral flexion Limited by 50%  Right rotation Limited by 50%  Left rotation Limited by 50%   (Blank rows = not tested)  LOWER EXTREMITY ROM:  WFL but tension felt pt pain at Rt side  LOWER EXTREMITY MMT:  MMT Right Eval All out of 5 Left Eval All out of 5  Hip flexion 2 3+  Hip extension 3 4  Hip abduction 2+ 4  Hip adduction 3 4  Hip internal rotation    Hip external rotation    Knee flexion 3+ 4+  Knee extension 2+ 4+  Ankle dorsiflexion 0 4  Ankle plantarflexion 1+ 4  Ankle inversion 1+ 4  Ankle eversion 1+ 4    PALPATION:   General  TTP and tension noted throughout Rt thoracic and  lumbar paraspinals, TTP at coccyx, Rt piriformis, throughout gluteal rt                 External Perineal Exam deferred to next session                              Internal Pelvic Floor deferred to next session   Patient confirms identification and approves PT to assess internal pelvic floor and treatment Yes next session   PELVIC MMT:   MMT eval  Vaginal 0/5, pt also has decreased internally at lt pelvic floor and unable to feel well for contraction.   Internal Anal Sphincter   External Anal Sphincter   Puborectalis   Diastasis Recti   (Blank rows = not tested)        TONE: deferred to next session  02/05/22 - decreased   PROLAPSE: deferred to next session  02/05/22 - anterior vaginal wall laxity possibly grade 2 noted in hooklying with strong cough   TODAY'S TREATMENT   02/20/22:  Manual work externally at Rt lumbar paraspinals, gluteal, hip flexor and hamstring to decreased tension felt here and pain management for pt. Pt tolerated well with addaday used initially at level 2 throughout these regions then progressed to hands on at lumbar paraspinals with gentle release for muscle lengthening and decreased pain. Pt reported decreased  pain from 8/10>4/10.    PATIENT EDUCATION:  Education details: to be given Person educated: Patient Education method: Consulting civil engineer, Demonstration, Tactile cues, Verbal cues, and Handouts Education comprehension: verbalized understanding and returned demonstration   HOME EXERCISE PROGRAM: To be given  ASSESSMENT:  CLINICAL IMPRESSION: Patient session focused on manual work at trunk and hips for improved muscle relaxation and lengthening for improved pain management. Pt tolerated well and responded well with 50% decreased pain at end of session. Pt also demonstrated improved gait mechanics at end of session and reports she felt better.   Pt would benefit from additional PT to further address deficits.     OBJECTIVE IMPAIRMENTS decreased  activity tolerance, decreased coordination, decreased endurance, decreased mobility, difficulty walking, decreased strength, increased fascial restrictions, increased muscle spasms, impaired flexibility, improper body mechanics, postural dysfunction, and pain.   ACTIVITY LIMITATIONS carrying, lifting, sitting, standing, squatting, stairs, continence, toileting, and locomotion level  PARTICIPATION LIMITATIONS: meal prep, cleaning, interpersonal relationship, driving, shopping, community activity, and yard work  PERSONAL FACTORS Past/current experiences, Time since onset of injury/illness/exacerbation, and 3+ comorbidities: medical history  are also affecting patient's functional outcome.   REHAB POTENTIAL: Good  CLINICAL DECISION MAKING: Evolving/moderate complexity  EVALUATION COMPLEXITY: Moderate   GOALS: Goals reviewed with patient? Yes  SHORT TERM GOALS: Target date: 02/13/2022  Pt to be I with HEP.  Baseline: Goal status: INITIAL  2.  Pt will report 25% reduction of pain due to improvements in posture, strength, and muscle length  Baseline: 10/10 Goal status: INITIAL  3.  Pt will report her BMs and bladder voids are complete due to improved bowel habits and evacuation techniques.  Baseline:  Goal status: INITIAL   LONG TERM GOALS: Target date:  04/18/22    Pt to be I with advanced HEP.  Baseline:  Goal status: INITIAL  2.  Pt will report 50% reduction of pain due to improvements in posture, strength, and muscle length  Baseline:  Goal status: INITIAL  3.  Pt to be I with pelvic wand/dilator use for pain management outside of PT.  Baseline:  Goal status: INITIAL  4.  Pt to be I with abdominal massage, voiding mechanics, breathing mechanics for improved pelvic relaxation and decreased pain.  Baseline:  Goal status: INITIAL  5.  Pt to demonstrated improved coordination of pelvic floor and breathing mechanics with functional squat with no increase in pain. Baseline:   Goal status: INITIAL  6.  Pt to demonstrate at least 3/5 pelvic floor strength and ability to fully relax post contraction for improved pelvic stability and decreased strain at pelvic floor. Baseline:  Goal status: INITIAL  PLAN: PT FREQUENCY: 1x/week  PT DURATION:  8 sessions  PLANNED INTERVENTIONS: Therapeutic exercises, Therapeutic activity, Neuromuscular re-education, Patient/Family education, Self Care, Joint mobilization, Aquatic Therapy, Dry Needling, Spinal mobilization, Cryotherapy, Moist heat, scar mobilization, Taping, Vasopneumatic device, Biofeedback, and Manual therapy  PLAN FOR NEXT SESSION: internal as needed and pt consents, strengthening at core and hips, coordination of pelvic floor and breathing, education on wand use  Stacy Gardner, PT, DPT 02/20/2310:55 AM

## 2022-02-25 NOTE — Therapy (Signed)
OUTPATIENT PHYSICAL THERAPY FEMALE PELVIC TREATMENT   Patient Name: Melissa Pruitt MRN: [id number] DOB:Sep 12, 1975, 46 y.o., female Today's Date: 02/26/2022   PT End of Session - 02/26/22 1151     Visit Number 5    Date for PT Re-Evaluation 04/18/22    Authorization Type [employer]    PT Start Time 1150    PT Stop Time 1228    PT Time Calculation (min) 38 min    Activity Tolerance Patient tolerated treatment well    Behavior During Therapy [facility] [address] for tasks assessed/performed               Past Medical History:  Diagnosis Date   Abnormal Pap smear of cervix    Cervical cancer ([place])    removed in July 2009   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   R mid sole superior moderate atypia    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   R med sole inferior moderate atypia    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   R dorsum lat great toe   Dysplastic nevus 06/11/2020   left prox lat thigh sup, - severe   Dysplastic nevus 06/11/2020   left prox lat thigh inf, - moderate   Dysplastic nevus 06/11/2020   R mid to upper back 2.0cm lat to spine, Severe atypia   Dysplastic nevus 06/11/2020   R distal post thigh, moderate atypia   Dysplastic nevus 08/23/2020   Right labia - mild   Dysplastic nevus 08/12/2021   R lat neck post, moderate atypia   History of recurrent UTIs    HPV (human papilloma virus) infection    HSV (herpes simplex virus) infection    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[facility])    sacro/coccygeal mass excision   Melanoma ([place]) 02/16/2020   R mid plantar sole, BRESLOW'S DEPTH/MAXIMUM TUMOR THICKNESS: 0.5 MM, CLARK/ANATOMIC LEVEL: II Excised 03/20/2020   Normocytic anemia 08/21/2014   Tethered spinal cord ([place])    Vitamin B 12 deficiency 08/21/2014   Past Surgical History:  Procedure Laterality Date   BACK SURGERY     BLADDER SURGERY     COLOSTOMY     after flex sig as 46 year old.    colostomy reversal     EXPLORATORY LAPAROTOMY     with colostomy placement   LEEP      removal of cervical cancer     TUBAL LIGATION     Patient Active Problem List   Diagnosis Date Noted   Allergic urticaria 07/02/2020   Food allergy 07/02/2020   Tendinitis of knee 07/02/2020   Vasomotor rhinitis 07/02/2020   Melanoma ([place]) 07/02/2020   Pain in joint of left shoulder 11/08/2019   Cervical radiculopathy 05/24/2019   Trigeminal neuralgia 11/05/2018   Symptomatic states associated with artificial menopause 11/05/2018   Arthralgia of hand, right 11/17/2017   Vitamin D deficiency 02/18/2017   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   Other specified disorders of uterus 10/12/2015   Herpes simplex 06/27/2015   Hyperlipidemia 05/30/2015   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   Normocytic anemia 08/21/2014   Vitamin B 12 deficiency 08/21/2014   Allergic rhinitis 01/19/2014   Condyloma acuminatum 01/09/2014   Chronic pain 08/09/2013   Cervical dysplasia 05/09/2013   Other headache syndrome 05/09/2013   Arthralgia of temporomandibular joint 05/09/2013   Cervical pain 08/25/2012   Pain in joint, pelvic region and thigh 07/16/2012   Tethered spinal cord ([place]) 07/16/2012   Balance disorder 07/16/2012   Personal history of fall 07/16/2012   Neurogenic bladder 06/25/2011  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   OTHER SPECIFIED CONGENITAL ANOMALY SPINAL CORD 05/08/2010   UNSPEC CONGN ANOMALY BRAIN SP CORD&NERV SYSTEM 05/08/2010   H N P-LUMBAR 04/23/2010   BACK PAIN 01/28/2010   Papanicolaou smear of cervix with atypical squamous cells cannot exclude high grade squamous intraepithelial lesion (ASC-H) 04/14/1898    PCP: Danelle Berry, NP  REFERRING PROVIDER: Vladimir Crofts, MD  REFERRING DIAG: R10.2 (ICD-10-CM) - Pelvic pain  THERAPY DIAG:  Abnormal posture  Muscle weakness (generalized)  Other muscle spasm  Unspecified lack of coordination  Rationale for Evaluation and Treatment Rehabilitation  ONSET DATE: worsening over the past couple years  SUBJECTIVE:                                                                                                                                                                                            SUBJECTIVE STATEMENT: Pt states that she is here for DN visit. Still feeling significant Rt abdominal/low back/pelvic tightness.  PAIN:  Are you having pain? Yes NPRS scale: 8/10 Pain location: Internal, External, and Rt side of pelvic, low back, right hip.   Pain type: aching, burning, dull, and stabbing  Pain description: constant (but level varies)  Aggravating factors: standing, sitting more than 30 mins Relieving factors: rest, muscle relaxer  PRECAUTIONS: Fall  WEIGHT BEARING RESTRICTIONS No  FALLS:  Has patient fallen in last 6 months? Yes. Number of falls 2  LIVING ENVIRONMENT: Lives with: lives with their family Lives in: House/apartment   OCCUPATION: disabled  PLOF: Independent  PATIENT GOALS to have less pain  PERTINENT HISTORY:  myelomeningocele,  self-catheterizes for neurogenic bladder, Cervical cancer, HPV , recurrent UTIs, HSV , chronic rt hip pain, melanoma sle of Rt foot has been excised but has foot sensory changes, cervical radiculopathy, trigeminal neuralgia, grade 1 carpal tunnel, left mild chronic C7 radiculopathy,   Sexual abuse: No  BOWEL MOVEMENT Pain with bowel movement: No Type of bowel movement:Type (Bristol Stool Scale) type 1-2 usually, will have diarrhea intermittently but thinks a lot of this is more about constipation and low motility, Frequency empty bowels every time caths, and usually this is enough unless has diarrhea, and Strain No Fully empty rectum: No does not have urge regularly, does have increased pressure in low back.   Leakage: Yes: with diarrhea wears AAT Pads: Yes: pull ups AAT Fiber supplement: No  URINATION Pain with urination: No Fully empty bladder: No does self cath to empty Stream: self-cath Urgency: No has no urge to have urine  void Frequency: self caths 6x per day Leakage:  none Pads: No  INTERCOURSE  Pain with intercourse:  not painful internally but will have pain at Rt pelvis or hip Ability to have vaginal penetration:  Yes:   Climax: yes not painful Marinoff Scale: 0/3  PREGNANCY Vaginal deliveries 3 Tearing Yes: tearing with first with stiches, tearing with second C-section deliveries 0 Currently pregnant No  PROLAPSE None    OBJECTIVE:   DIAGNOSTIC FINDINGS:    PATIENT SURVEYS:    [id number]   COGNITION:  Overall cognitive status: Within functional limits for tasks assessed     SENSATION:  Light touch: Deficits Rt flank down to toes has decreased sensation, decreased sensation at external genitalis   Proprioception: Deficits Rt side decreased   MUSCLE LENGTH: Rt hamstring and adductor limited by 50%, Lt 25%    GAIT: Distance walked: 250' Assistive device utilized: None Level of assistance: Modified independence Comments: decreased mobility at Rt LE, foot drop Rt foot, decreased flexion of Rt knee, hip circumduction noted with gait at Rt, decreased cadence               POSTURE: rounded shoulders, forward head, and posterior pelvic tilt   LUMBARAROM/PROM  A/PROM A/PROM  eval  Flexion WFL  Extension WFL  Right lateral flexion Limited by 50%  Left lateral flexion Limited by 50%  Right rotation Limited by 50%  Left rotation Limited by 50%   (Blank rows = not tested)  LOWER EXTREMITY ROM:  WFL but tension felt pt pain at Rt side  LOWER EXTREMITY MMT:  MMT Right Eval All out of 5 Left Eval All out of 5  Hip flexion 2 3+  Hip extension 3 4  Hip abduction 2+ 4  Hip adduction 3 4  Hip internal rotation    Hip external rotation    Knee flexion 3+ 4+  Knee extension 2+ 4+  Ankle dorsiflexion 0 4  Ankle plantarflexion 1+ 4  Ankle inversion 1+ 4  Ankle eversion 1+ 4    PALPATION:   General  TTP and tension noted throughout Rt thoracic and lumbar paraspinals,  TTP at coccyx, Rt piriformis, throughout gluteal rt                 External Perineal Exam deferred to next session                              Internal Pelvic Floor deferred to next session   Patient confirms identification and approves PT to assess internal pelvic floor and treatment Yes next session   PELVIC MMT:   MMT eval  Vaginal 0/5, pt also has decreased internally at lt pelvic floor and unable to feel well for contraction.   Internal Anal Sphincter   External Anal Sphincter   Puborectalis   Diastasis Recti   (Blank rows = not tested)        TONE: deferred to next session  02/05/22 - decreased   PROLAPSE: deferred to next session  02/05/22 - anterior vaginal wall laxity possibly grade 2 noted in hooklying with strong cough   TODAY'S TREATMENT   02/26/22: Trigger Point Dry-Needling  Treatment instructions: Expect mild to moderate muscle soreness. S/S of pneumothorax if dry needled over a lung field, and to seek immediate medical attention should they occur. Patient verbalized understanding of these instructions and education.  Patient Consent Given: Yes Education handout provided: Yes Muscles treated: Rt iliacus, common iliopsoas insertion, TFL, pectineus, proximal adductors, rectus femoris, obliques Electrical stimulation performed: No  Parameters: N/A Treatment response/outcome: significant twitch response and improved muscle tone Manual: soft tissue mobilization and palpation of anterior Rt side abdominals, hip flexors, and adductors.   PATIENT EDUCATION:  Education details: to be given Person educated: Patient Education method: Consulting civil engineer, Demonstration, Tactile cues, Verbal cues, and Handouts Education comprehension: verbalized understanding and returned demonstration   HOME EXERCISE PROGRAM: To be given  ASSESSMENT:  CLINICAL IMPRESSION: Pt tolerated DN and palpation/soft tissue of restricted tissue well demonstrated by significant twitch response  and lengthening of muscle and fascia. We discussed benefit of further DN to these areas in order to continue release of chronic restriction. She was educated on the importance of following up DN and manual techniques with mobility activities she has been working on. Initially she presented with notable Rt pelvic rotation that improved by about 25% by end of session. Pt would benefit from additional PT to further address deficits.     OBJECTIVE IMPAIRMENTS decreased activity tolerance, decreased coordination, decreased endurance, decreased mobility, difficulty walking, decreased strength, increased fascial restrictions, increased muscle spasms, impaired flexibility, improper body mechanics, postural dysfunction, and pain.   ACTIVITY LIMITATIONS carrying, lifting, sitting, standing, squatting, stairs, continence, toileting, and locomotion level  PARTICIPATION LIMITATIONS: meal prep, cleaning, interpersonal relationship, driving, shopping, community activity, and yard work  PERSONAL FACTORS Past/current experiences, Time since onset of injury/illness/exacerbation, and 3+ comorbidities: medical history  are also affecting patient's functional outcome.   REHAB POTENTIAL: Good  CLINICAL DECISION MAKING: Evolving/moderate complexity  EVALUATION COMPLEXITY: Moderate   GOALS: Goals reviewed with patient? Yes  SHORT TERM GOALS: Target date: 02/13/2022  Pt to be I with HEP.  Baseline: Goal status: INITIAL  2.  Pt will report 25% reduction of pain due to improvements in posture, strength, and muscle length  Baseline: 10/10 Goal status: INITIAL  3.  Pt will report her BMs and bladder voids are complete due to improved bowel habits and evacuation techniques.  Baseline:  Goal status: INITIAL   LONG TERM GOALS: Target date:  04/18/22    Pt to be I with advanced HEP.  Baseline:  Goal status: INITIAL  2.  Pt will report 50% reduction of pain due to improvements in posture, strength, and muscle  length  Baseline:  Goal status: INITIAL  3.  Pt to be I with pelvic wand/dilator use for pain management outside of PT.  Baseline:  Goal status: INITIAL  4.  Pt to be I with abdominal massage, voiding mechanics, breathing mechanics for improved pelvic relaxation and decreased pain.  Baseline:  Goal status: INITIAL  5.  Pt to demonstrated improved coordination of pelvic floor and breathing mechanics with functional squat with no increase in pain. Baseline:  Goal status: INITIAL  6.  Pt to demonstrate at least 3/5 pelvic floor strength and ability to fully relax post contraction for improved pelvic stability and decreased strain at pelvic floor. Baseline:  Goal status: INITIAL  PLAN: PT FREQUENCY: 1x/week  PT DURATION:  8 sessions  PLANNED INTERVENTIONS: Therapeutic exercises, Therapeutic activity, Neuromuscular re-education, Patient/Family education, Self Care, Joint mobilization, Aquatic Therapy, Dry Needling, Spinal mobilization, Cryotherapy, Moist heat, scar mobilization, Taping, Vasopneumatic device, Biofeedback, and Manual therapy  PLAN FOR NEXT SESSION: internal as needed and pt consents, strengthening at core and hips, coordination of pelvic floor and breathing, education on wand use; continue DN as needed.   Heather Roberts, PT, DPT11/15/231:50 PM

## 2022-02-26 ENCOUNTER — Ambulatory Visit: Payer: Medicaid Other

## 2022-02-26 DIAGNOSIS — M6281 Muscle weakness (generalized): Secondary | ICD-10-CM

## 2022-02-26 DIAGNOSIS — R293 Abnormal posture: Secondary | ICD-10-CM

## 2022-02-26 DIAGNOSIS — M62838 Other muscle spasm: Secondary | ICD-10-CM

## 2022-02-26 DIAGNOSIS — R279 Unspecified lack of coordination: Secondary | ICD-10-CM

## 2022-02-26 NOTE — Patient Instructions (Signed)

## 2022-02-28 ENCOUNTER — Encounter: Payer: Medicaid Other | Admitting: Physical Therapy

## 2022-03-05 ENCOUNTER — Ambulatory Visit: Payer: Medicaid Other | Admitting: Physical Therapy

## 2022-03-05 DIAGNOSIS — M6281 Muscle weakness (generalized): Secondary | ICD-10-CM | POA: Diagnosis not present

## 2022-03-05 DIAGNOSIS — M62838 Other muscle spasm: Secondary | ICD-10-CM

## 2022-03-05 DIAGNOSIS — R293 Abnormal posture: Secondary | ICD-10-CM

## 2022-03-05 NOTE — Therapy (Signed)
OUTPATIENT PHYSICAL THERAPY FEMALE PELVIC TREATMENT   Patient Name: Melissa Pruitt MRN: [id number] DOB:06/05/1975, 46 y.o., female Today's Date: 03/05/2022   PT End of Session - 03/05/22 1014     Visit Number 6   5 treatments   Number of Visits 5    Date for PT Re-Evaluation 04/18/22    Authorization Type [place] medicaid [employer]    PT Start Time 1015    PT Stop Time 1059    PT Time Calculation (min) 44 min    Activity Tolerance Patient tolerated treatment well    Behavior During Therapy WFL for tasks assessed/performed               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   R mid sole superior moderate atypia    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   R med sole inferior moderate atypia    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   R dorsum lat great toe   Dysplastic nevus 06/11/2020   left prox lat thigh sup, - severe   Dysplastic nevus 06/11/2020   left prox lat thigh inf, - moderate   Dysplastic nevus 06/11/2020   R mid to upper back 2.0cm lat to spine, Severe atypia   Dysplastic nevus 06/11/2020   R distal post thigh, moderate atypia   Dysplastic nevus 08/23/2020   Right labia - mild   Dysplastic nevus 08/12/2021   R lat neck post, moderate atypia   History of recurrent UTIs    HPV (human papilloma virus) infection    HSV (herpes simplex virus) infection    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(HCC)    sacro/coccygeal mass excision   Melanoma (HCC) 02/16/2020   R mid plantar sole, BRESLOW'S DEPTH/MAXIMUM TUMOR THICKNESS: 0.5 MM, CLARK/ANATOMIC LEVEL: II Excised 03/20/2020   Normocytic anemia 08/21/2014   Tethered spinal cord (HCC)    Vitamin B 12 deficiency 08/21/2014   Past Surgical History:  Procedure Laterality Date   BACK SURGERY     BLADDER SURGERY     COLOSTOMY     after flex sig as 46 year old.    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   Melanoma (HCC) 07/02/2020   Pain in joint of left shoulder 11/08/2019   Cervical radiculopathy 05/24/2019   Trigeminal neuralgia 11/05/2018   Symptomatic states associated with artificial menopause 11/05/2018   Arthralgia of hand, right 11/17/2017   Vitamin D deficiency 02/18/2017   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   Other specified disorders of uterus 10/12/2015   Herpes simplex 06/27/2015   Hyperlipidemia 05/30/2015   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   Normocytic anemia 08/21/2014   Vitamin B 12 deficiency 08/21/2014   Allergic rhinitis 01/19/2014   Condyloma acuminatum 01/09/2014   Chronic pain 08/09/2013   Cervical dysplasia 05/09/2013   Other headache syndrome 05/09/2013   Arthralgia of temporomandibular joint 05/09/2013   Cervical pain 08/25/2012   Pain in joint, pelvic region and thigh 07/16/2012   Tethered spinal cord (HCC) 07/16/2012   Balance disorder 07/16/2012   Personal   history of fall 07/16/2012   Neurogenic bladder 06/25/2011   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([place]) 12/23/2010   OTHER SPECIFIED CONGENITAL ANOMALY SPINAL CORD 05/08/2010   UNSPEC [place] SP CORD&NERV SYSTEM 05/08/2010   H N P-LUMBAR 04/23/2010   BACK PAIN 01/28/2010   Papanicolaou smear of cervix with atypical squamous cells cannot exclude high grade squamous intraepithelial lesion (ASC-H) 04/14/1898    PCP: Danelle Berry, NP  REFERRING PROVIDER: Vladimir Crofts, MD  REFERRING DIAG: R10.2 (ICD-10-CM) - Pelvic pain  THERAPY DIAG:  Abnormal posture  Muscle weakness (generalized)  Other muscle spasm  Rationale for Evaluation and Treatment Rehabilitation  ONSET DATE: worsening over the past couple years  SUBJECTIVE:                                                                                                                                                                                            SUBJECTIVE STATEMENT: Pt reports she had much less pain and improved mobility after dry needling last session.   PAIN:  Are you having pain? Yes NPRS scale: 8/10 Pain location: Internal, External, and Rt side of pelvic, low back, right hip.   Pain type: aching, burning, dull, and stabbing  Pain description: constant (but level varies)  Aggravating factors: standing, sitting more than 30 mins Relieving factors: rest, muscle relaxer  PRECAUTIONS: Fall  WEIGHT BEARING RESTRICTIONS No  FALLS:  Has patient fallen in last 6 months? Yes. Number of falls 2  LIVING ENVIRONMENT: Lives with: lives with their family Lives in: House/apartment   OCCUPATION: disabled  PLOF: Independent  PATIENT GOALS to have less pain  PERTINENT HISTORY:  myelomeningocele,  self-catheterizes for neurogenic bladder, Cervical cancer, HPV , recurrent UTIs, HSV , chronic rt hip pain, melanoma sle of Rt foot has been excised but has foot sensory changes, cervical radiculopathy, trigeminal neuralgia, grade 1 carpal tunnel, left mild chronic C7 radiculopathy,   Sexual abuse: No  BOWEL MOVEMENT Pain with bowel movement: No Type of bowel movement:Type (Bristol Stool Scale) type 1-2 usually, will have diarrhea intermittently but thinks a lot of this is more about constipation and low motility, Frequency empty bowels every time caths, and usually this is enough unless has diarrhea, and Strain No Fully empty rectum: No does not have urge regularly, does have increased pressure in low back.   Leakage: Yes: with diarrhea wears AAT Pads: Yes: pull ups AAT Fiber supplement: No  URINATION Pain with urination: No Fully empty bladder: No does self cath to empty Stream: self-cath Urgency: No has no urge to have urine void Frequency: self  caths 6x per day Leakage:  none Pads: No  INTERCOURSE Pain with intercourse:  not painful internally but will have pain at Rt pelvis or hip Ability to have vaginal penetration:  Yes:   Climax: yes not painful Marinoff Scale: 0/3  PREGNANCY Vaginal deliveries 3 Tearing Yes: tearing with first with stiches, tearing with second C-section deliveries 0 Currently pregnant No  PROLAPSE None    OBJECTIVE:   DIAGNOSTIC FINDINGS:    PATIENT SURVEYS:   PFIQ-7 86 PFIQ-7 48 03/05/22    COGNITION:  Overall cognitive status: Within functional limits for tasks assessed     SENSATION:  Light touch: Deficits Rt flank down to toes has decreased sensation, decreased sensation at external genitalis   Proprioception: Deficits Rt side decreased   MUSCLE LENGTH: Rt hamstring and adductor limited by 50%, Lt 25%    GAIT: Distance walked: 250' Assistive device utilized: None Level of assistance: Modified independence Comments: decreased mobility at Rt LE, foot drop Rt foot, decreased flexion of Rt knee, hip circumduction noted with gait at Rt, decreased cadence               POSTURE: rounded shoulders, forward head, and posterior pelvic tilt   LUMBARAROM/PROM  A/PROM A/PROM  eval  Flexion WFL  Extension WFL  Right lateral flexion Limited by 50%  Left lateral flexion Limited by 50%  Right rotation Limited by 50%  Left rotation Limited by 50%   (Blank rows = not tested)  LOWER EXTREMITY ROM:  WFL but tension felt pt pain at Rt side  LOWER EXTREMITY MMT:  MMT Right Eval All out of 5 Left Eval All out of 5  Hip flexion 2 3+  Hip extension 3 4  Hip abduction 2+ 4  Hip adduction 3 4  Hip internal rotation    Hip external rotation    Knee flexion 3+ 4+  Knee extension 2+ 4+  Ankle dorsiflexion 0 4  Ankle plantarflexion 1+ 4  Ankle inversion 1+ 4  Ankle eversion 1+ 4    PALPATION:   General  TTP and tension noted throughout Rt thoracic and lumbar paraspinals,  TTP at coccyx, Rt piriformis, throughout gluteal rt                 External Perineal Exam deferred to next session                              Internal Pelvic Floor deferred to next session   Patient confirms identification and approves PT to assess internal pelvic floor and treatment Yes next session   PELVIC MMT:   MMT eval  Vaginal 0/5, pt also has decreased internally at lt pelvic floor and unable to feel well for contraction.   Internal Anal Sphincter   External Anal Sphincter   Puborectalis   Diastasis Recti   (Blank rows = not tested)        TONE: deferred to next session  02/05/22 - decreased   PROLAPSE: deferred to next session  02/05/22 - anterior vaginal wall laxity possibly grade 2 noted in hooklying with strong cough   TODAY'S TREATMENT   03/05/2022:  Manual work: side lying sacrotuberous ligament gentle release work at Rt side and Rt proximal adductors with noted tension and trigger points here. Soft tissue mobility completed in these regions. Pt responded well and demonstrated good release here. Then in Lt sidelying Rt external oblique manual work completed with gentle release at muscle tension throughout, progressed to   cross stretch at ribs and iliac crest 2x30s with breatihng mechanics cued. X5 sidelying rib mobility completed at Rt side as well with breathing pattern. X45s lateral stretch with ball at Lt flank. Pt tolerated well and reported feeling much better at end of session and reports able to move better at abdomen.   02/26/22: Trigger Point Dry-Needling  Treatment instructions: Expect mild to moderate muscle soreness. S/S of pneumothorax if dry needled over a lung field, and to seek immediate medical attention should they occur. Patient verbalized understanding of these instructions and education.  Patient Consent Given: Yes Education handout provided: Yes Muscles treated: Rt iliacus, common iliopsoas insertion, TFL, pectineus, proximal adductors,  rectus femoris, obliques Electrical stimulation performed: No Parameters: N/A Treatment response/outcome: significant twitch response and improved muscle tone Manual: soft tissue mobilization and palpation of anterior Rt side abdominals, hip flexors, and adductors.   PATIENT EDUCATION:  Education details: to be given Person educated: Patient Education method: Explanation, Demonstration, Tactile cues, Verbal cues, and Handouts Education comprehension: verbalized understanding and returned demonstration   HOME EXERCISE PROGRAM: To be given  ASSESSMENT:  CLINICAL IMPRESSION: Pt tolerated session well with emphasis being on manual work and decreased tension for improved mobility and decreased pain and restrictions. Pt reported she felt better at end of session and able to move better. Pt would benefit from additional PT to further address deficits with addition of dry needling for improved mobility now attempting with sessions and pt's chronic medical history and complexity. Pt has shown progress toward goals but progressing slowly due to needs of relaxation first then progress to strengthening to further decreased pain levels.    OBJECTIVE IMPAIRMENTS decreased activity tolerance, decreased coordination, decreased endurance, decreased mobility, difficulty walking, decreased strength, increased fascial restrictions, increased muscle spasms, impaired flexibility, improper body mechanics, postural dysfunction, and pain.   ACTIVITY LIMITATIONS carrying, lifting, sitting, standing, squatting, stairs, continence, toileting, and locomotion level  PARTICIPATION LIMITATIONS: meal prep, cleaning, interpersonal relationship, driving, shopping, community activity, and yard work  PERSONAL FACTORS Past/current experiences, Time since onset of injury/illness/exacerbation, and 3+ comorbidities: medical history  are also affecting patient's functional outcome.   REHAB POTENTIAL: Good  CLINICAL DECISION  MAKING: Evolving/moderate complexity  EVALUATION COMPLEXITY: Moderate   GOALS: Goals reviewed with patient? Yes  SHORT TERM GOALS: Target date: 02/13/2022  Pt to be I with HEP.  Baseline: Goal status: MET  2.  Pt will report 25% reduction of pain due to improvements in posture, strength, and muscle length  Baseline: 10/10 Goal status: on going  3.  Pt will report her BMs and bladder voids are complete due to improved bowel habits and evacuation techniques.  Baseline:  Goal status: on going   LONG TERM GOALS: Target date:  04/18/22    Pt to be I with advanced HEP.  Baseline:  Goal status: on going  2.  Pt will report 50% reduction of pain due to improvements in posture, strength, and muscle length  Baseline:  Goal status: on going  3.  Pt to be I with pelvic wand/dilator use for pain management outside of PT.  Baseline:  Goal status: on going  4.  Pt to be I with abdominal massage, voiding mechanics, breathing mechanics for improved pelvic relaxation and decreased pain.  Baseline:  Goal status: MET  5.  Pt to demonstrated improved coordination of pelvic floor and breathing mechanics with functional squat with no increase in pain. Baseline:  Goal status: on going  6.  Pt to demonstrate at   least 3/5 pelvic floor strength and ability to fully relax post contraction for improved pelvic stability and decreased strain at pelvic floor. Baseline:  Goal status: on going  PLAN: PT FREQUENCY: 1x/week  PT DURATION:  8 sessions  PLANNED INTERVENTIONS: Therapeutic exercises, Therapeutic activity, Neuromuscular re-education, Patient/Family education, Self Care, Joint mobilization, Aquatic Therapy, Dry Needling, Spinal mobilization, Cryotherapy, Moist heat, scar mobilization, Taping, Vasopneumatic device, Biofeedback, and Manual therapy  PLAN FOR NEXT SESSION: internal as needed and pt consents, strengthening at core and hips, coordination of pelvic floor and breathing, education  on wand use; continue DN as needed.   Stacy Gardner, PT, DPT 03/05/2310:26 AM

## 2022-03-13 ENCOUNTER — Ambulatory Visit: Payer: Medicaid Other | Admitting: Physical Therapy

## 2022-03-13 DIAGNOSIS — M62838 Other muscle spasm: Secondary | ICD-10-CM

## 2022-03-13 DIAGNOSIS — R279 Unspecified lack of coordination: Secondary | ICD-10-CM

## 2022-03-13 DIAGNOSIS — M6281 Muscle weakness (generalized): Secondary | ICD-10-CM | POA: Diagnosis not present

## 2022-03-13 DIAGNOSIS — R293 Abnormal posture: Secondary | ICD-10-CM

## 2022-03-13 NOTE — Therapy (Signed)
OUTPATIENT PHYSICAL THERAPY FEMALE PELVIC TREATMENT   Patient Name: Melissa Pruitt MRN: [id number] DOB:[birth year], 46 y.o., female Today's Date: 03/13/2022   PT End of Session - 03/13/22 1100     Visit Number 7   6 treatments (1 eval)   Number of Visits 10    Date for PT Re-Evaluation 04/18/22    Authorization Type [place] medicaid [employer]    PT Start Time 1100    PT Stop Time 1140    PT Time Calculation (min) 40 min    Activity Tolerance Patient tolerated treatment well    Behavior During Therapy [facility] for tasks assessed/performed               Past Medical History:  Diagnosis Date   Abnormal Pap smear of cervix    Cervical cancer ([place])    removed in July 2009   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   R mid sole superior moderate atypia    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   R med sole inferior moderate atypia    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   R dorsum lat great toe   Dysplastic nevus 06/11/2020   left prox lat thigh sup, - severe   Dysplastic nevus 06/11/2020   left prox lat thigh inf, - moderate   Dysplastic nevus 06/11/2020   R mid to upper back 2.0cm lat to spine, Severe atypia   Dysplastic nevus 06/11/2020   R distal post thigh, moderate atypia   Dysplastic nevus 08/23/2020   Right labia - mild   Dysplastic nevus 08/12/2021   R lat neck post, moderate atypia   History of recurrent UTIs    HPV (human papilloma virus) infection    HSV (herpes simplex virus) infection    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    colostomy reversal     EXPLORATORY LAPAROTOMY      with colostomy placement   LEEP     removal of cervical cancer     TUBAL LIGATION     Patient Active Problem List   Diagnosis Date Noted   Allergic urticaria 07/02/2020   Food allergy 07/02/2020   Tendinitis of knee 07/02/2020   Vasomotor rhinitis 07/02/2020   Melanoma ([place]) 07/02/2020   Pain in joint of left shoulder 11/08/2019   Cervical radiculopathy 05/24/2019   Trigeminal neuralgia 11/05/2018   Symptomatic states associated with artificial menopause 11/05/2018   Arthralgia of hand, right 11/17/2017   Vitamin D deficiency 02/18/2017   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   Other specified disorders of uterus 10/12/2015   Herpes simplex 06/27/2015   Hyperlipidemia 05/30/2015   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   Normocytic anemia 08/21/2014   Vitamin B 12 deficiency 08/21/2014   Allergic rhinitis 01/19/2014   Condyloma acuminatum 01/09/2014   Chronic pain 08/09/2013   Cervical dysplasia 05/09/2013   Other headache syndrome 05/09/2013   Arthralgia of temporomandibular joint 05/09/2013   Cervical pain 08/25/2012   Pain in joint, pelvic region and thigh 07/16/2012   Tethered spinal cord ([place]) 07/16/2012   Balance disorder 07/16/2012  Personal history of fall 07/16/2012   Neurogenic bladder 06/25/2011   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   OTHER SPECIFIED CONGENITAL ANOMALY SPINAL CORD 05/08/2010   UNSPEC CONGN ANOMALY BRAIN SP CORD&NERV SYSTEM 05/08/2010   H N P-LUMBAR 04/23/2010   BACK PAIN 01/28/2010   Papanicolaou smear of cervix with atypical squamous cells cannot exclude high grade squamous intraepithelial lesion (ASC-H) 04/14/1898    PCP: Danelle Berry, NP  REFERRING PROVIDER: Vladimir Crofts, MD  REFERRING DIAG: R10.2 (ICD-10-CM) - Pelvic pain  THERAPY DIAG:  Abnormal posture  Muscle weakness (generalized)  Unspecified lack of coordination  Other muscle spasm  Rationale for Evaluation and Treatment Rehabilitation  ONSET DATE: worsening  over the past couple years  SUBJECTIVE:                                                                                                                                                                                           SUBJECTIVE STATEMENT: Pt reports pain is about the same as last time.   PAIN:  Are you having pain? Yes NPRS scale: 8/10 Pain location: Internal, External, and Rt side of pelvic, low back, right hip.   Pain type: aching, burning, dull, and stabbing  Pain description: constant (but level varies)  Aggravating factors: standing, sitting more than 30 mins Relieving factors: rest, muscle relaxer  PRECAUTIONS: Fall  WEIGHT BEARING RESTRICTIONS No  FALLS:  Has patient fallen in last 6 months? Yes. Number of falls 2  LIVING ENVIRONMENT: Lives with: lives with their family Lives in: House/apartment   OCCUPATION: disabled  PLOF: Independent  PATIENT GOALS to have less pain  PERTINENT HISTORY:  myelomeningocele,  self-catheterizes for neurogenic bladder, Cervical cancer, HPV , recurrent UTIs, HSV , chronic rt hip pain, melanoma sle of Rt foot has been excised but has foot sensory changes, cervical radiculopathy, trigeminal neuralgia, grade 1 carpal tunnel, left mild chronic C7 radiculopathy,   Sexual abuse: No  BOWEL MOVEMENT Pain with bowel movement: No Type of bowel movement:Type (Bristol Stool Scale) type 1-2 usually, will have diarrhea intermittently but thinks a lot of this is more about constipation and low motility, Frequency empty bowels every time caths, and usually this is enough unless has diarrhea, and Strain No Fully empty rectum: No does not have urge regularly, does have increased pressure in low back.   Leakage: Yes: with diarrhea wears AAT Pads: Yes: pull ups AAT Fiber supplement: No  URINATION Pain with urination: No Fully empty bladder: No does self cath to empty Stream: self-cath Urgency: No has no urge to have urine void Frequency:  self caths 6x per day  Leakage:  none Pads: No  INTERCOURSE Pain with intercourse:  not painful internally but will have pain at Rt pelvis or hip Ability to have vaginal penetration:  Yes:   Climax: yes not painful Marinoff Scale: 0/3  PREGNANCY Vaginal deliveries 3 Tearing Yes: tearing with first with stiches, tearing with second C-section deliveries 0 Currently pregnant No  PROLAPSE None    OBJECTIVE:   DIAGNOSTIC FINDINGS:    PATIENT SURVEYS:   PFIQ-7 31 PFIQ-7 48 03/05/22    COGNITION:  Overall cognitive status: Within functional limits for tasks assessed     SENSATION:  Light touch: Deficits Rt flank down to toes has decreased sensation, decreased sensation at external genitalis   Proprioception: Deficits Rt side decreased   MUSCLE LENGTH: Rt hamstring and adductor limited by 50%, Lt 25%    GAIT: Distance walked: 250' Assistive device utilized: None Level of assistance: Modified independence Comments: decreased mobility at Rt LE, foot drop Rt foot, decreased flexion of Rt knee, hip circumduction noted with gait at Rt, decreased cadence               POSTURE: rounded shoulders, forward head, and posterior pelvic tilt   LUMBARAROM/PROM  A/PROM A/PROM  eval  Flexion WFL  Extension WFL  Right lateral flexion Limited by 50%  Left lateral flexion Limited by 50%  Right rotation Limited by 50%  Left rotation Limited by 50%   (Blank rows = not tested)  LOWER EXTREMITY ROM:  WFL but tension felt pt pain at Rt side  LOWER EXTREMITY MMT:  MMT Right Eval All out of 5 Left Eval All out of 5  Hip flexion 2 3+  Hip extension 3 4  Hip abduction 2+ 4  Hip adduction 3 4  Hip internal rotation    Hip external rotation    Knee flexion 3+ 4+  Knee extension 2+ 4+  Ankle dorsiflexion 0 4  Ankle plantarflexion 1+ 4  Ankle inversion 1+ 4  Ankle eversion 1+ 4    PALPATION:   General  TTP and tension noted throughout Rt thoracic and lumbar  paraspinals, TTP at coccyx, Rt piriformis, throughout gluteal rt                 External Perineal Exam deferred to next session                              Internal Pelvic Floor deferred to next session   Patient confirms identification and approves PT to assess internal pelvic floor and treatment Yes next session   PELVIC MMT:   MMT eval  Vaginal 0/5, pt also has decreased internally at lt pelvic floor and unable to feel well for contraction.   Internal Anal Sphincter   External Anal Sphincter   Puborectalis   Diastasis Recti   (Blank rows = not tested)        TONE: deferred to next session  02/05/22 - decreased   PROLAPSE: deferred to next session  02/05/22 - anterior vaginal wall laxity possibly grade 2 noted in hooklying with strong cough   TODAY'S TREATMENT   03/13/22:  Manual: pt consented to internal vaginal treatment today. Found to have multiple trigger points and muscle tension throughout Rt side of pelvic floor emphasis on Rt pubococcygeus and obturator with gentle over pressure for release of trigger points, and gentle mobility at tension to improve muscle lengthening and pelvic floor relaxation. Pt cued throughout for  diaphragmatic breathing. Pt tolerated well and reports she felt much better at end of session from 8/10> 5/10.    PATIENT EDUCATION:  Education details: to be given Person educated: Patient Education method: Consulting civil engineer, Demonstration, Tactile cues, Verbal cues, and Handouts Education comprehension: verbalized understanding and returned demonstration   HOME EXERCISE PROGRAM: To be given  ASSESSMENT:  CLINICAL IMPRESSION: Pt tolerated session well with emphasis being on manual work and decreased tension for improved mobility and decreased pain and restrictions internally at pelvic floor. Pt reported she felt better at end of session with pain decreasing from 8 to 5/10. Pt would benefit from additional PT to further address deficits with  addition of dry needling for improved mobility now attempting with sessions and pt's chronic medical history and complexity. Pt has shown progress toward goals but progressing slowly due to needs of relaxation first then progress to strengthening to further decreased pain levels.    OBJECTIVE IMPAIRMENTS decreased activity tolerance, decreased coordination, decreased endurance, decreased mobility, difficulty walking, decreased strength, increased fascial restrictions, increased muscle spasms, impaired flexibility, improper body mechanics, postural dysfunction, and pain.   ACTIVITY LIMITATIONS carrying, lifting, sitting, standing, squatting, stairs, continence, toileting, and locomotion level  PARTICIPATION LIMITATIONS: meal prep, cleaning, interpersonal relationship, driving, shopping, community activity, and yard work  PERSONAL FACTORS Past/current experiences, Time since onset of injury/illness/exacerbation, and 3+ comorbidities: medical history  are also affecting patient's functional outcome.   REHAB POTENTIAL: Good  CLINICAL DECISION MAKING: Evolving/moderate complexity  EVALUATION COMPLEXITY: Moderate   GOALS: Goals reviewed with patient? Yes  SHORT TERM GOALS: Target date: 02/13/2022  Pt to be I with HEP.  Baseline: Goal status: MET  2.  Pt will report 25% reduction of pain due to improvements in posture, strength, and muscle length  Baseline: 10/10 Goal status: on going  3.  Pt will report her BMs and bladder voids are complete due to improved bowel habits and evacuation techniques.  Baseline:  Goal status: on going   LONG TERM GOALS: Target date:  04/18/22    Pt to be I with advanced HEP.  Baseline:  Goal status: on going  2.  Pt will report 50% reduction of pain due to improvements in posture, strength, and muscle length  Baseline:  Goal status: on going  3.  Pt to be I with pelvic wand/dilator use for pain management outside of PT.  Baseline:  Goal status: on  going  4.  Pt to be I with abdominal massage, voiding mechanics, breathing mechanics for improved pelvic relaxation and decreased pain.  Baseline:  Goal status: MET  5.  Pt to demonstrated improved coordination of pelvic floor and breathing mechanics with functional squat with no increase in pain. Baseline:  Goal status: on going  6.  Pt to demonstrate at least 3/5 pelvic floor strength and ability to fully relax post contraction for improved pelvic stability and decreased strain at pelvic floor. Baseline:  Goal status: on going  PLAN: PT FREQUENCY: 1x/week  PT DURATION:  8 sessions  PLANNED INTERVENTIONS: Therapeutic exercises, Therapeutic activity, Neuromuscular re-education, Patient/Family education, Self Care, Joint mobilization, Aquatic Therapy, Dry Needling, Spinal mobilization, Cryotherapy, Moist heat, scar mobilization, Taping, Vasopneumatic device, Biofeedback, and Manual therapy  PLAN FOR NEXT SESSION: internal as needed and pt consents, strengthening at core and hips, coordination of pelvic floor and breathing, continue DN as needed.   Stacy Gardner, PT, DPT 03/13/2310:43 AM

## 2022-03-20 ENCOUNTER — Ambulatory Visit: Payer: Medicaid Other | Admitting: Physical Therapy

## 2022-03-27 ENCOUNTER — Ambulatory Visit: Payer: Medicaid Other | Attending: Neurology | Admitting: Physical Therapy

## 2022-03-27 DIAGNOSIS — M6281 Muscle weakness (generalized): Secondary | ICD-10-CM | POA: Insufficient documentation

## 2022-03-27 DIAGNOSIS — R279 Unspecified lack of coordination: Secondary | ICD-10-CM | POA: Diagnosis present

## 2022-03-27 DIAGNOSIS — R293 Abnormal posture: Secondary | ICD-10-CM | POA: Diagnosis present

## 2022-03-27 NOTE — Therapy (Addendum)
OUTPATIENT PHYSICAL THERAPY FEMALE PELVIC TREATMENT   Patient Name: Melissa Pruitt MRN: [id number] DOB:01/05/1976, 46 y.o., female Today's Date: 03/27/2022   PT End of Session - 03/27/22 1058     Visit Number 8   7 treatments + eval   Number of Visits 10    Date for PT Re-Evaluation 04/18/22    Authorization Type [place] medicaid [employer]    PT Start Time 1100    PT Stop Time 1139    PT Time Calculation (min) 39 min    Activity Tolerance Patient tolerated treatment well    Behavior During Therapy [facility] for tasks assessed/performed               Past Medical History:  Diagnosis Date   Abnormal Pap smear of cervix    Cervical cancer ([facility])    removed in July 2009   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   R mid sole superior moderate atypia    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   R med sole inferior moderate atypia    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   R dorsum lat great toe   Dysplastic nevus 06/11/2020   left prox lat thigh sup, - severe   Dysplastic nevus 06/11/2020   left prox lat thigh inf, - moderate   Dysplastic nevus 06/11/2020   R mid to upper back 2.0cm lat to spine, Severe atypia   Dysplastic nevus 06/11/2020   R distal post thigh, moderate atypia   Dysplastic nevus 08/23/2020   Right labia - mild   Dysplastic nevus 08/12/2021   R lat neck post, moderate atypia   History of recurrent UTIs    HPV (human papilloma virus) infection    HSV (herpes simplex virus) infection    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    colostomy reversal     EXPLORATORY LAPAROTOMY      with colostomy placement   LEEP     removal of cervical cancer     TUBAL LIGATION     Patient Active Problem List   Diagnosis Date Noted   Allergic urticaria 07/02/2020   Food allergy 07/02/2020   Tendinitis of knee 07/02/2020   Vasomotor rhinitis 07/02/2020   Melanoma ([place]) 07/02/2020   Pain in joint of left shoulder 11/08/2019   Cervical radiculopathy 05/24/2019   Trigeminal neuralgia 11/05/2018   Symptomatic states associated with artificial menopause 11/05/2018   Arthralgia of hand, right 11/17/2017   Vitamin D deficiency 02/18/2017   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   Other specified disorders of uterus 10/12/2015   Herpes simplex 06/27/2015   Hyperlipidemia 05/30/2015   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   Normocytic anemia 08/21/2014   Vitamin B 12 deficiency 08/21/2014   Allergic rhinitis 01/19/2014   Condyloma acuminatum 01/09/2014   Chronic pain 08/09/2013   Cervical dysplasia 05/09/2013   Other headache syndrome 05/09/2013   Arthralgia of temporomandibular joint 05/09/2013   Cervical pain 08/25/2012   Pain in joint, pelvic region and thigh 07/16/2012   Tethered spinal cord ([place]) 07/16/2012   Balance disorder 07/16/2012  Personal history of fall 07/16/2012   Neurogenic bladder 06/25/2011   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   OTHER SPECIFIED CONGENITAL ANOMALY SPINAL CORD 05/08/2010   UNSPEC CONGN ANOMALY BRAIN SP CORD&NERV SYSTEM 05/08/2010   H N P-LUMBAR 04/23/2010   BACK PAIN 01/28/2010   Papanicolaou smear of cervix with atypical squamous cells cannot exclude high grade squamous intraepithelial lesion (ASC-H) 04/14/1898    PCP: Danelle Berry, NP  REFERRING PROVIDER: Vladimir Crofts, MD  REFERRING DIAG: R10.2 (ICD-10-CM) - Pelvic pain  THERAPY DIAG:  Abnormal posture  Muscle weakness (generalized)  Unspecified lack of coordination  Rationale for Evaluation and Treatment Rehabilitation  ONSET DATE: worsening over the past couple  years  SUBJECTIVE:                                                                                                                                                                                           SUBJECTIVE STATEMENT: Pt reports last session with internal "was a world of difference". "I have been having some spasm pain on by right butt cheek from my sit bone to my tailbone".   PAIN:  Are you having pain? Yes NPRS scale: 7/10 Pain location: Internal, External, and Rt side of pelvic, low back, right hip.   Pain type: aching, burning, dull, and stabbing  Pain description: constant (but level varies)  Aggravating factors: standing, sitting more than 30 mins Relieving factors: rest, muscle relaxer  PRECAUTIONS: Fall  WEIGHT BEARING RESTRICTIONS No  FALLS:  Has patient fallen in last 6 months? Yes. Number of falls 2  LIVING ENVIRONMENT: Lives with: lives with their family Lives in: House/apartment   OCCUPATION: disabled  PLOF: Independent  PATIENT GOALS to have less pain  PERTINENT HISTORY:  myelomeningocele,  self-catheterizes for neurogenic bladder, Cervical cancer, HPV , recurrent UTIs, HSV , chronic rt hip pain, melanoma sle of Rt foot has been excised but has foot sensory changes, cervical radiculopathy, trigeminal neuralgia, grade 1 carpal tunnel, left mild chronic C7 radiculopathy,   Sexual abuse: No  BOWEL MOVEMENT Pain with bowel movement: No Type of bowel movement:Type (Bristol Stool Scale) type 1-2 usually, will have diarrhea intermittently but thinks a lot of this is more about constipation and low motility, Frequency empty bowels every time caths, and usually this is enough unless has diarrhea, and Strain No Fully empty rectum: No does not have urge regularly, does have increased pressure in low back.   Leakage: Yes: with diarrhea wears AAT Pads: Yes: pull ups AAT Fiber supplement: No  URINATION Pain with urination: No Fully empty bladder: No does  self cath to empty Stream:  self-cath Urgency: No has no urge to have urine void Frequency: self caths 6x per day Leakage:  none Pads: No  INTERCOURSE Pain with intercourse:  not painful internally but will have pain at Rt pelvis or hip Ability to have vaginal penetration:  Yes:   Climax: yes not painful Marinoff Scale: 0/3  PREGNANCY Vaginal deliveries 3 Tearing Yes: tearing with first with stiches, tearing with second C-section deliveries 0 Currently pregnant No  PROLAPSE None    OBJECTIVE:   DIAGNOSTIC FINDINGS:    PATIENT SURVEYS:   PFIQ-7 46 PFIQ-7 48 03/05/22    COGNITION:  Overall cognitive status: Within functional limits for tasks assessed     SENSATION:  Light touch: Deficits Rt flank down to toes has decreased sensation, decreased sensation at external genitalis   Proprioception: Deficits Rt side decreased   MUSCLE LENGTH: Rt hamstring and adductor limited by 50%, Lt 25%    GAIT: Distance walked: 250' Assistive device utilized: None Level of assistance: Modified independence Comments: decreased mobility at Rt LE, foot drop Rt foot, decreased flexion of Rt knee, hip circumduction noted with gait at Rt, decreased cadence               POSTURE: rounded shoulders, forward head, and posterior pelvic tilt   LUMBARAROM/PROM  A/PROM A/PROM  eval  Flexion WFL  Extension WFL  Right lateral flexion Limited by 50%  Left lateral flexion Limited by 50%  Right rotation Limited by 50%  Left rotation Limited by 50%   (Blank rows = not tested)  LOWER EXTREMITY ROM:  WFL but tension felt pt pain at Rt side  LOWER EXTREMITY MMT:  MMT Right Eval All out of 5 Left Eval All out of 5  Hip flexion 2 3+  Hip extension 3 4  Hip abduction 2+ 4  Hip adduction 3 4  Hip internal rotation    Hip external rotation    Knee flexion 3+ 4+  Knee extension 2+ 4+  Ankle dorsiflexion 0 4  Ankle plantarflexion 1+ 4  Ankle inversion 1+ 4  Ankle eversion 1+ 4     PALPATION:   General  TTP and tension noted throughout Rt thoracic and lumbar paraspinals, TTP at coccyx, Rt piriformis, throughout gluteal rt                 External Perineal Exam deferred to next session                              Internal Pelvic Floor deferred to next session   Patient confirms identification and approves PT to assess internal pelvic floor and treatment Yes next session   PELVIC MMT:   MMT eval  Vaginal 0/5, pt also has decreased internally at lt pelvic floor and unable to feel well for contraction.   Internal Anal Sphincter   External Anal Sphincter   Puborectalis   Diastasis Recti   (Blank rows = not tested)        TONE: deferred to next session  02/05/22 - decreased   PROLAPSE: deferred to next session  02/05/22 - anterior vaginal wall laxity possibly grade 2 noted in hooklying with strong cough   TODAY'S TREATMENT   03/27/22:   Manual: pt consented to internal vaginal treatment today. Found to have multiple trigger points and muscle tension throughout Rt pubococcygeus and iliococcygeus with gentle over pressure for release of trigger points, and gentle mobility at tension to improve  muscle lengthening and pelvic floor relaxation. Pt cued throughout for diaphragmatic breathing. Pt tolerated well and released well reported she no longer felt tightness/pulsating. Progressed to external manual work post Comptroller and glove change. Focus at right sacrotuberous ligament and rt piriformis with release work and tissue mobility here. Pt tolerated well and reports she felt much better at end of session, reports spasm pain was gone and it had been there "for days".   PATIENT EDUCATION:  Education details: to be given Person educated: Patient Education method: Consulting civil engineer, Demonstration, Tactile cues, Verbal cues, and Handouts Education comprehension: verbalized understanding and returned demonstration   HOME EXERCISE PROGRAM: To be given - pt wants to  bring her previous HEP in  next session and see if these are helpful.  ASSESSMENT:  CLINICAL IMPRESSION: Pt tolerated session well with emphasis being on manual work and decreased tension for improved mobility and decreased pain and restrictions internally at pelvic floor. Pt reported she felt better at end of session with pain decreasing with no spasm pain. Pt would benefit from additional PT to further address deficits with addition of dry needling for improved mobility now attempting with sessions and pt's chronic medical history and complexity. Pt has shown progress toward goals but progressing slowly due to needs of relaxation first then progress to strengthening to further decreased pain levels.    OBJECTIVE IMPAIRMENTS decreased activity tolerance, decreased coordination, decreased endurance, decreased mobility, difficulty walking, decreased strength, increased fascial restrictions, increased muscle spasms, impaired flexibility, improper body mechanics, postural dysfunction, and pain.   ACTIVITY LIMITATIONS carrying, lifting, sitting, standing, squatting, stairs, continence, toileting, and locomotion level  PARTICIPATION LIMITATIONS: meal prep, cleaning, interpersonal relationship, driving, shopping, community activity, and yard work  PERSONAL FACTORS Past/current experiences, Time since onset of injury/illness/exacerbation, and 3+ comorbidities: medical history  are also affecting patient's functional outcome.   REHAB POTENTIAL: Good  CLINICAL DECISION MAKING: Evolving/moderate complexity  EVALUATION COMPLEXITY: Moderate   GOALS: Goals reviewed with patient? Yes  SHORT TERM GOALS: Target date: 02/13/2022  Pt to be I with HEP.  Baseline: Goal status: MET  2.  Pt will report 25% reduction of pain due to improvements in posture, strength, and muscle length  Baseline: 10/10 Goal status: on going  3.  Pt will report her BMs and bladder voids are complete due to improved bowel  habits and evacuation techniques.  Baseline:  Goal status: on going   LONG TERM GOALS: Target date:  04/18/22    Pt to be I with advanced HEP.  Baseline:  Goal status: on going  2.  Pt will report 50% reduction of pain due to improvements in posture, strength, and muscle length  Baseline:  Goal status: on going  3.  Pt to be I with pelvic wand/dilator use for pain management outside of PT.  Baseline:  Goal status: on going  4.  Pt to be I with abdominal massage, voiding mechanics, breathing mechanics for improved pelvic relaxation and decreased pain.  Baseline:  Goal status: MET  5.  Pt to demonstrated improved coordination of pelvic floor and breathing mechanics with functional squat with no increase in pain. Baseline:  Goal status: on going  6.  Pt to demonstrate at least 3/5 pelvic floor strength and ability to fully relax post contraction for improved pelvic stability and decreased strain at pelvic floor. Baseline:  Goal status: on going  PLAN: PT FREQUENCY: 1x/week  PT DURATION:  8 sessions  PLANNED INTERVENTIONS: Therapeutic exercises, Therapeutic activity, Neuromuscular re-education, Patient/Family  education, Self Care, Joint mobilization, Aquatic Therapy, Dry Needling, Spinal mobilization, Cryotherapy, Moist heat, scar mobilization, Taping, Vasopneumatic device, Biofeedback, and Manual therapy  PLAN FOR NEXT SESSION: internal as needed and pt consents, strengthening at core and hips, coordination of pelvic floor and breathing, continue DN as needed.   Stacy Gardner, PT, DPT 03/27/2310:44 AM   PHYSICAL THERAPY DISCHARGE SUMMARY  Visits from Start of Care: 8  Current functional level related to goals / functional outcomes: incomplete   Remaining deficits: See above   Education / Equipment: hep   Patient agrees to discharge. Patient goals were met. Patient is being discharged due to not returning since the last visit.  Heather Roberts, PT, DPT02/21/2411:34  AM

## 2022-04-03 ENCOUNTER — Ambulatory Visit: Payer: Medicaid Other

## 2022-04-10 ENCOUNTER — Ambulatory Visit: Payer: Medicaid Other

## 2022-04-11 ENCOUNTER — Encounter: Payer: Medicaid Other | Admitting: Physical Therapy

## 2022-04-17 ENCOUNTER — Ambulatory Visit: Payer: Medicaid Other | Admitting: Dermatology

## 2022-04-21 ENCOUNTER — Emergency Department (HOSPITAL_COMMUNITY): Payer: Medicaid Other

## 2022-04-21 ENCOUNTER — Other Ambulatory Visit: Payer: Self-pay

## 2022-04-21 ENCOUNTER — Emergency Department (HOSPITAL_COMMUNITY)
Admission: EM | Admit: 2022-04-21 | Discharge: 2022-04-21 | Disposition: A | Discharge status: Home / Self Care | Payer: Medicaid Other | Attending: Emergency Medicine | Admitting: Emergency Medicine

## 2022-04-21 ENCOUNTER — Encounter (HOSPITAL_COMMUNITY): Payer: Self-pay

## 2022-04-21 DIAGNOSIS — H66001 Acute suppurative otitis media without spontaneous rupture of ear drum, right ear: Secondary | ICD-10-CM

## 2022-04-21 DIAGNOSIS — R0602 Shortness of breath: Secondary | ICD-10-CM | POA: Insufficient documentation

## 2022-04-21 DIAGNOSIS — H9201 Otalgia, right ear: Secondary | ICD-10-CM | POA: Diagnosis present

## 2022-04-21 DIAGNOSIS — Z1152 Encounter for screening for COVID-19: Secondary | ICD-10-CM | POA: Insufficient documentation

## 2022-04-21 LAB — CBC WITH DIFFERENTIAL/PLATELET
Abs Immature Granulocytes: 0.01 10*3/uL (ref 0.00–0.07)
Basophils Absolute: 0 10*3/uL (ref 0.0–0.1)
Basophils Relative: 1 %
Eosinophils Absolute: 0 10*3/uL (ref 0.0–0.5)
Eosinophils Relative: 1 %
HCT: 38.2 % (ref 36.0–46.0)
Hemoglobin: 12.6 g/dL (ref 12.0–15.0)
Immature Granulocytes: 0 %
Lymphocytes Relative: 32 %
Lymphs Abs: 2 10*3/uL (ref 0.7–4.0)
MCH: 29.8 pg (ref 26.0–34.0)
MCHC: 33 g/dL (ref 30.0–36.0)
MCV: 90.3 fL (ref 80.0–100.0)
Monocytes Absolute: 0.6 10*3/uL (ref 0.1–1.0)
Monocytes Relative: 9 %
Neutro Abs: 3.6 10*3/uL (ref 1.7–7.7)
Neutrophils Relative %: 57 %
Platelets: 284 10*3/uL (ref 150–400)
RBC: 4.23 MIL/uL (ref 3.87–5.11)
RDW: 12.6 % (ref 11.5–15.5)
WBC: 6.2 10*3/uL (ref 4.0–10.5)
nRBC: 0 % (ref 0.0–0.2)

## 2022-04-21 LAB — BASIC METABOLIC PANEL
Anion gap: 7 (ref 5–15)
BUN: 11 mg/dL (ref 6–20)
CO2: 25 mmol/L (ref 22–32)
Calcium: 9.6 mg/dL (ref 8.9–10.3)
Chloride: 103 mmol/L (ref 98–111)
Creatinine, Ser: 0.57 mg/dL (ref 0.44–1.00)
GFR, Estimated: >60 mL/min (ref >60)
Glucose, Bld: 92 mg/dL (ref 70–99)
Potassium: 4.2 mmol/L (ref 3.5–5.1)
Sodium: 135 mmol/L (ref 135–145)

## 2022-04-21 LAB — I-STAT BETA HCG BLOOD, ED (MC, WL, AP ONLY): I-stat hCG, quantitative: <5 m[IU]/mL (ref <5)

## 2022-04-21 LAB — TROPONIN I (HIGH SENSITIVITY): Troponin I (High Sensitivity): <2 ng/L (ref <18)

## 2022-04-21 LAB — RESP PANEL BY RT-PCR (RSV, FLU A&B, COVID)  RVPGX2
Influenza A by PCR: NEGATIVE
Influenza B by PCR: NEGATIVE
Resp Syncytial Virus by PCR: NEGATIVE
SARS Coronavirus 2 by RT PCR: NEGATIVE

## 2022-04-21 MED ORDER — AMOXICILLIN 500 MG PO CAPS: 500.0000 mg | ORAL_CAPSULE | Freq: Three times a day (TID) | ORAL | 0 refills | Status: DC

## 2022-04-21 NOTE — ED Provider Triage Note (Signed)
Emergency Medicine Provider Triage Evaluation Note  CHARMELLE SOH , a 47 y.o. female  was evaluated in triage.  Pt complains of shortness of breath and palpitation symptoms.  Started having chest pain in the middle of her chest, some radiation to the back.  States she has been having nausea and vomiting over the last few days.  Discussed by her primary for COVID but was negative.  Also endorsing sinus congestion.  No loss of consciousness, lateralized weakness or numbness, vision changes..  Review of Systems  Per HPI  Physical Exam  BP 128/69 (BP Location: Left Arm)   Pulse 93   Temp 98.9 F (37.2 C) (Oral)   Resp 17   Ht '5\' 4"'$  (1.626 m)   SpO2 97%   BMI 20.60 kg/m  Gen:   Awake, no distress   Resp:  Normal effort  MSK:   Moves extremities without difficulty  Other:  Upper and lower extremity pulses symmetric bilaterally  Medical Decision Making  Medically screening exam initiated at 7:32 PM.  Appropriate orders placed.  RONNETTA CURRINGTON was informed that the remainder of the evaluation will be completed by another provider, this initial triage assessment does not replace that evaluation, and the importance of remaining in the ED until their evaluation is complete.     Sherrill Raring, [place] 04/21/22 1933

## 2022-04-21 NOTE — ED Triage Notes (Signed)
Pt reports recent COVID and reports pain in upper back, shortness of breath on exertion and chest pain and pressure into right side face. Pt reports hx of mitral valve regurgitation.

## 2022-04-21 NOTE — ED Provider Notes (Signed)
Melissa Pruitt   CSN: [id number] Arrival date & time: 04/21/22  1759     History  Chief Complaint  Patient presents with   Shortness of Breath   Palpitations    Melissa Pruitt is a 47 y.o. female.  Patient is a 47 year old female who presents with sinus pressure and ear pain.  She states she started having some cough and cold symptoms December 21.  She had myalgias, fevers at that time and a productive cough.  She also had a lot of nasal congestion.  She said her cough is overall improving and overall she is feeling better but she started to have pressure in her sinuses and pain in her right ear with pulsatile feeling in her ear.  She does not have any ongoing fevers.  No nausea or vomiting.  She has some pain in her chest and upper back but only when she is coughing.  No other chest pain.  She also has like her heart is pounding when she has these coughing spells but no other palpitations.       Home Medications Prior to Admission medications   Medication Sig Start Date End Date Taking? Authorizing Provider  amoxicillin (AMOXIL) 500 MG capsule Take 1 capsule (500 mg total) by mouth 3 (three) times daily. 04/21/22  Yes Malvin Johns, MD  clobetasol cream (TEMOVATE) 0.05 % Apply topically aa of back twice daily and cover with bandage. Use for up to 2 weeks. Avoid applying to face, groin, and axilla. Use as directed. 01/09/22   Ralene Bathe, MD  conjugated estrogens (PREMARIN) vaginal cream Place 1 Applicatorful vaginally 2 (two) times a week. Place 0.5g nightly for two weeks then twice a week after 03/01/20   Jaquita Folds, MD  Cyanocobalamin (VITAMIN B 12 PO) Take by mouth.    [provider]  doxycycline (MONODOX) 100 MG capsule Take 1 capsule (100 mg total) by mouth 2 (two) times daily. Take with food and drink. 01/06/22   Ralene Bathe, MD  EPINEPHrine 0.3 mg/0.3 mL IJ SOAJ injection Inject into the muscle once.     [provider]  estradiol (ESTRACE) 1 MG tablet Take 1 tablet (1 mg total) by mouth daily. Take for 2 weeks each month for bleeding 08/15/20 08/15/21  Donnamae Jude, MD  fluticasone [facility]) 50 MCG/ACT nasal spray Place 2 sprays into the nose daily.    [provider]  hydrocortisone 2.5 % cream Apply topically at bedtime. Apply to under left arm on Monday, Wednesday and Friday. 06/11/20   Ralene Bathe, MD  loratadine (CLARITIN) 10 MG tablet Take 10 mg by mouth daily.    [provider]  methocarbamol (ROBAXIN) 750 MG tablet Take 750 mg by mouth 3 (three) times daily.    [provider]  montelukast (SINGULAIR) 10 MG tablet Take 10 mg by mouth at bedtime.    [provider]  mupirocin ointment (BACTROBAN) 2 % Apply 1 application topically daily. With dressing changes 04/25/20   Ralene Bathe, MD  mupirocin ointment Drue Stager) 2 % Apply to skin qd-bid 03/20/21   Ralene Bathe, MD  norethindrone (MICRONOR) 0.35 MG tablet TAKE 1 TABLET BY MOUTH EVERY DAY 05/04/21   Donnamae Jude, MD      Allergies    Ciprofloxacin, Macrobid [nitrofurantoin monohyd macro], Morphine and related, Azithromycin, Dilaudid [hydromorphone hcl], Lactose, Prednisolone, Prednisone, Sulfa antibiotics, Tramadol hcl, Vitamin d analogs, Lidocaine, and Pork-derived products  Review of Systems   Review of Systems  Constitutional:  Positive for fatigue. Negative for chills, diaphoresis and fever.  HENT:  Positive for congestion, ear pain and rhinorrhea. Negative for sneezing.   Eyes: Negative.   Respiratory:  Positive for cough. Negative for chest tightness and shortness of breath.   Cardiovascular:  Positive for chest pain (With coughing). Negative for leg swelling.  Gastrointestinal:  Negative for abdominal pain, blood in stool, diarrhea, nausea and vomiting.  Genitourinary:  Negative for difficulty urinating, flank pain, frequency and hematuria.  Musculoskeletal:   Positive for back pain (With coughing). Negative for arthralgias.  Skin:  Negative for rash.  Neurological:  Negative for dizziness, speech difficulty, weakness, numbness and headaches.    Physical Exam Updated Vital Signs BP 116/70 (BP Location: Left Arm)   Pulse 82   Temp 98.1 F (36.7 C) (Oral)   Resp 17   Ht '5\' 4"'$  (1.626 m)   SpO2 100%   BMI 20.60 kg/m  Physical Exam Constitutional:      Appearance: She is well-developed.  HENT:     Head: Normocephalic and atraumatic.     Ears:     Comments: Left now has a cerumen impaction.  Right TM is erythematous with cloudy fluid behind the TM. Eyes:     Pupils: Pupils are equal, round, and reactive to light.  Cardiovascular:     Rate and Rhythm: Normal rate and regular rhythm.     Heart sounds: Normal heart sounds.  Pulmonary:     Effort: Pulmonary effort is normal. No respiratory distress.     Breath sounds: Normal breath sounds. No wheezing or rales.  Chest:     Chest wall: No tenderness.  Abdominal:     General: Bowel sounds are normal.     Palpations: Abdomen is soft.     Tenderness: There is no abdominal tenderness. There is no guarding or rebound.  Musculoskeletal:        General: Normal range of motion.     Cervical back: Normal range of motion and neck supple.  Lymphadenopathy:     Cervical: No cervical adenopathy.  Skin:    General: Skin is warm and dry.     Findings: No rash.  Neurological:     Mental Status: She is alert and oriented to person, place, and time.     ED Results / Procedures / Treatments   Labs (all labs ordered are listed, but only abnormal results are displayed) Labs Reviewed  RESP PANEL BY RT-PCR (RSV, FLU A&B, COVID)  [id number]  BASIC METABOLIC PANEL  CBC WITH DIFFERENTIAL/PLATELET  I-STAT BETA HCG BLOOD, ED ([facility], [facility], [facility] ONLY)  TROPONIN I (HIGH SENSITIVITY)  TROPONIN I (HIGH SENSITIVITY)    EKG EKG Interpretation  Date/Time:  Monday April 21 2022 18:18:39 EST Ventricular Rate:   91 PR Interval:  114 QRS Duration: 82 QT Interval:  336 QTC Calculation: 413 R Axis:   78 Text Interpretation: Normal sinus rhythm Biatrial enlargement Abnormal ECG When compared with ECG of 19-Aug-2021 12:29, PREVIOUS ECG IS PRESENT since last tracing no significant change Confirmed by Malvin Johns [phone]) on 04/21/2022 9:11:00 PM  Radiology DG Chest 2 View  Result Date: 04/21/2022 CLINICAL DATA:  COVID with recent upper back pain and shortness of breath on exertion. EXAM: CHEST - 2 VIEW COMPARISON:  Chest radiograph 08/19/2021 FINDINGS: The cardiomediastinal silhouette is normal There is no focal consolidation or pulmonary edema. There is no pleural effusion or pneumothorax There is no acute  osseous abnormality. IMPRESSION: No radiographic evidence of acute cardiopulmonary process. Electronically Signed   By: Valetta Mole M.D.   On: 04/21/2022 19:54    Procedures Procedures    Medications Ordered in ED Medications - No data to display  ED Course/ Medical Decision Making/ A&P                           Medical Decision Making Problems Addressed: Non-recurrent acute suppurative otitis media of right ear without spontaneous rupture of tympanic membrane: undiagnosed new problem with uncertain prognosis  Amount and/or Complexity of Data Reviewed External Data Reviewed: notes. Labs: ordered. Decision-making details documented in ED Course. Radiology: ordered and independent interpretation performed. Decision-making details documented in ED Course. ECG/medicine tests: ordered and independent interpretation performed. Decision-making details documented in ED Course.  Risk Prescription drug management. Decision regarding hospitalization.   Patient is a 47 year old female who presents 2 weeks post URI symptoms.  Overall her symptoms are improving.  However she does have some increased pain in her right ear and right sinus area.  On exam it appears that she has bit of a right otitis  media.  Will start amoxicillin for this.  Chest x-ray two-view was performed which was interpreted by me and confirmed by the radiologist to show no evidence of pneumonia.  No other acute abnormality.  Her EKG does not show any ischemic changes.  Her troponin is negative.  I do not feel that a repeat is necessary.  Other labs are nonconcerning.  Her COVID/flu test is negative.  However she likely had a viral syndrome that started about 2 weeks ago.  Appears that she has now developed an otitis media.  Will treat with antibiotics.  Overall is well-appearing and I feel is appropriate for discharge.  She was encouraged to follow-up with her primary care provider if her symptoms are not improving.  Return precautions were given.  She also will start taking Mucinex.  Final Clinical Impression(s) / ED Diagnoses Final diagnoses:  Non-recurrent acute suppurative otitis media of right ear without spontaneous rupture of tympanic membrane    Rx / DC Orders ED Discharge Orders          Ordered    amoxicillin (AMOXIL) 500 MG capsule  3 times daily        04/21/22 2138              Malvin Johns, MD 04/21/22 2143

## 2022-04-22 ENCOUNTER — Ambulatory Visit: Payer: Medicaid Other | Attending: Neurology | Admitting: Physical Therapy

## 2022-05-12 ENCOUNTER — Other Ambulatory Visit: Payer: Self-pay | Admitting: Family Medicine

## 2022-05-12 ENCOUNTER — Encounter: Payer: Self-pay | Admitting: Obstetrics and Gynecology

## 2022-05-12 DIAGNOSIS — N938 Other specified abnormal uterine and vaginal bleeding: Secondary | ICD-10-CM

## 2022-05-12 DIAGNOSIS — N319 Neuromuscular dysfunction of bladder, unspecified: Secondary | ICD-10-CM

## 2022-05-12 MED ORDER — COMFORT PROTECT ADULT DIAPER/M MISC: 1.0000 | 11 refills | Status: DC | PRN

## 2022-05-24 ENCOUNTER — Encounter: Payer: Self-pay | Admitting: Internal Medicine

## 2022-05-27 ENCOUNTER — Ambulatory Visit: Payer: Medicaid Other | Admitting: Dermatology

## 2022-06-02 ENCOUNTER — Encounter: Payer: Self-pay | Admitting: *Deleted

## 2022-06-10 ENCOUNTER — Ambulatory Visit: Payer: Medicaid Other | Admitting: Dermatology

## 2022-06-12 ENCOUNTER — Encounter: Payer: Self-pay | Admitting: Obstetrics and Gynecology

## 2022-06-12 ENCOUNTER — Ambulatory Visit (INDEPENDENT_AMBULATORY_CARE_PROVIDER_SITE_OTHER): Payer: Medicaid Other | Admitting: Obstetrics and Gynecology

## 2022-06-12 ENCOUNTER — Other Ambulatory Visit: Payer: Self-pay

## 2022-06-12 ENCOUNTER — Other Ambulatory Visit (HOSPITAL_COMMUNITY)
Admission: RE | Admit: 2022-06-12 | Discharge: 2022-06-12 | Disposition: A | Discharge status: Home / Self Care | Payer: Medicaid Other | Source: Ambulatory Visit | Attending: Obstetrics and Gynecology | Admitting: Obstetrics and Gynecology

## 2022-06-12 VITALS — BP 103/59 | HR 66 | Ht 64.0 in | Wt 121.0 lb

## 2022-06-12 DIAGNOSIS — N938 Other specified abnormal uterine and vaginal bleeding: Secondary | ICD-10-CM | POA: Diagnosis not present

## 2022-06-12 DIAGNOSIS — Z01419 Encounter for gynecological examination (general) (routine) without abnormal findings: Secondary | ICD-10-CM | POA: Diagnosis present

## 2022-06-12 DIAGNOSIS — R300 Dysuria: Secondary | ICD-10-CM | POA: Diagnosis not present

## 2022-06-12 MED ORDER — NORETHINDRONE 0.35 MG PO TABS: 1.0000 | ORAL_TABLET | Freq: Every day | ORAL | 3 refills | Status: DC

## 2022-06-12 NOTE — Progress Notes (Signed)
ANNUAL EXAM Patient name: Melissa Pruitt MRN [id number]  Date of birth: Sep 24, 1975 Chief Complaint:   Gynecologic Exam  History of Present Illness:   Melissa Pruitt is a 47 y.o. G3P3 being seen today for a routine annual exam.  Current complaints: desires refill of norethindrone  No breast or nipple changes. Baseline self-catheterizes. Taking norethindrone for menstrual suppression with prn estrogen for BTB.   No LMP recorded. (Menstrual status: Perimenopausal).  Last pap     Component Value Date/Time   DIAGPAP  06/21/2019 1116    - Negative for Intraepithelial Lesions or Malignancy (NILM)   DIAGPAP - Benign reactive/reparative changes 06/21/2019 1116   [place] Negative 06/21/2019 1116   ADEQPAP  06/21/2019 1116    Satisfactory for evaluation; transformation zone component PRESENT.   Last mammogram: 12/2020 BIRADS 1      06/12/2022    3:54 PM 07/02/2020    9:11 AM 12/29/2019    8:48 AM 11/22/2019   11:23 AM 09/09/2019    9:41 AM  Depression screen PHQ 2/9  Decreased Interest 0 0 0 0 0  Down, Depressed, Hopeless 0 0 0 0 0  PHQ - 2 Score 0 0 0 0 0  Altered sleeping 0 0 0 0 0  Tired, decreased energy 0 0 '3 2 3  '$ Change in appetite 0 0 0 0 0  Feeling bad or failure about yourself  0 0 0 0 0  Trouble concentrating 0 0 0 0 0  Moving slowly or fidgety/restless 0 0 0 0 0  Suicidal thoughts 0 0 0 0 0  PHQ-9 Score 0 0 '3 2 3        '$ 06/12/2022    3:54 PM 07/02/2020    9:11 AM 12/29/2019    8:49 AM 11/22/2019   11:23 AM  GAD 7 : Generalized Anxiety Score  Nervous, Anxious, on Edge 0 0 0 0  Control/stop worrying 0 0 0 0  Worry too much - different things 0 0 0 0  Trouble relaxing 0 0 0 0  Restless 0 0 0 0  Easily annoyed or irritable 0 0 0 0  Afraid - awful might happen 0 0 0 0  Total GAD 7 Score 0 0 0 0     Review of Systems:   Pertinent items are noted in HPI Denies any headaches, blurred vision, fatigue, shortness of breath, chest pain, abdominal pain, abnormal  vaginal discharge/itching/odor/irritation, problems with periods, bowel movements, urination, or intercourse unless otherwise stated above. Pertinent History Reviewed:  Reviewed past medical,surgical, social and family history.  Reviewed problem list, medications and allergies. Physical Assessment:   Vitals:   06/12/22 1548  BP: (!) 103/59  Pulse: 66  Weight: 121 lb (54.9 kg)  Height: '5\' 4"'$  (1.626 m)  Body mass index is 20.77 kg/m.        Physical Examination:   General appearance - well appearing, and in no distress  Mental status - alert, oriented to person, place, and time  Psych:  She has a normal mood and affect  Skin - warm and dry, normal color, no suspicious lesions noted  Chest - effort normal, all lung fields clear to auscultation bilaterally  Heart - normal rate and regular rhythm  Breasts - breasts appear normal, no suspicious masses, no skin or nipple changes or axillary nodes  Abdomen - soft, nontender, nondistended, no masses or organomegaly  Pelvic -  VULVA: normal appearing vulva  VAGINA: large presumed Skene's gland, nontender CERVIX: normal  appearing cervix without discharge or lesions  Thin prep pap is done with HR HPV cotesting  Extremities:  No swelling or varicosities noted  Chaperone present for exam  No results found for this or any previous visit (from the past 24 hour(s)).    Assessment & Plan:  1. Encounter for annual routine gynecological examination Routine pap collected - Cytology - PAP( [place])  2. Dysuria Urine culture collected for new onset urinary complaints - Urine Culture  3. DUB (dysfunctional uterine bleeding) Continue POP for menstrual suppression  - norethindrone (MICRONOR) 0.35 MG tablet; Take 1 tablet (0.35 mg total) by mouth daily.  Dispense: 84 tablet; Refill: 3  - Cytology - PAP( [place])   Orders Placed This Encounter  Procedures   Urine Culture    Meds:  Meds ordered this encounter  Medications    norethindrone (MICRONOR) 0.35 MG tablet    Sig: Take 1 tablet (0.35 mg total) by mouth daily.    Dispense:  84 tablet    Refill:  3    Follow-up: No follow-ups on file.  Darliss Cheney, MD 06/12/2022 5:57 PM

## 2022-06-15 LAB — URINE CULTURE: Organism ID, Bacteria: NO GROWTH

## 2022-06-17 LAB — CYTOLOGY - PAP
Comment: NEGATIVE
Diagnosis: NEGATIVE
High risk HPV: NEGATIVE

## 2022-06-19 ENCOUNTER — Encounter: Payer: Self-pay | Admitting: Dermatology

## 2022-06-19 ENCOUNTER — Ambulatory Visit: Payer: Medicaid Other | Admitting: Dermatology

## 2022-06-19 VITALS — BP 105/80 | HR 60

## 2022-06-19 DIAGNOSIS — Z1283 Encounter for screening for malignant neoplasm of skin: Secondary | ICD-10-CM

## 2022-06-19 DIAGNOSIS — L578 Other skin changes due to chronic exposure to nonionizing radiation: Secondary | ICD-10-CM

## 2022-06-19 DIAGNOSIS — D1801 Hemangioma of skin and subcutaneous tissue: Secondary | ICD-10-CM

## 2022-06-19 DIAGNOSIS — L905 Scar conditions and fibrosis of skin: Secondary | ICD-10-CM

## 2022-06-19 DIAGNOSIS — Z85820 Personal history of malignant melanoma of skin: Secondary | ICD-10-CM | POA: Diagnosis not present

## 2022-06-19 DIAGNOSIS — Z86018 Personal history of other benign neoplasm: Secondary | ICD-10-CM

## 2022-06-19 DIAGNOSIS — D229 Melanocytic nevi, unspecified: Secondary | ICD-10-CM

## 2022-06-19 DIAGNOSIS — L821 Other seborrheic keratosis: Secondary | ICD-10-CM

## 2022-06-19 DIAGNOSIS — L814 Other melanin hyperpigmentation: Secondary | ICD-10-CM

## 2022-06-19 NOTE — Progress Notes (Signed)
Follow-Up Visit   Subjective  Melissa Pruitt is a 47 y.o. female who presents for the following: Total body skin exam (Hx of Melanoma R mid plantar sole, hx of Dysplastic Nevi). The patient presents for Total-Body Skin Exam (TBSE) for skin cancer screening and mole check.  The patient has spots, moles and lesions to be evaluated, some may be new or changing and the patient has concerns that these could be cancer.   The following portions of the chart were reviewed this encounter and updated as appropriate:   Tobacco  Allergies  Meds  Problems  Med Hx  Surg Hx  Fam Hx     Review of Systems:  No other skin or systemic complaints except as noted in HPI or Assessment and Plan.  Objective  Well appearing patient in no apparent distress; mood and affect are within normal limits.  A full examination was performed including scalp, head, eyes, ears, nose, lips, neck, chest, axillae, abdomen, back, buttocks, bilateral upper extremities, bilateral lower extremities, hands, feet, fingers, toes, fingernails, and toenails. All findings within normal limits unless otherwise noted below.  R mid plantar sole Well healed scar with no evidence of recurrence, no lymphadenopathy.   L buttock Hypopigmented scar  L dorsal foot Hypopigmented scar  Assessment & Plan  History of melanoma R mid plantar sole  BRESLOW'S DEPTH/MAXIMUM TUMOR THICKNESS: 0.5 MM, CLARK/ANATOMIC LEVEL: II Excised 03/20/2020   Clear. Observe for recurrence.  No lymphadenopathy.  Call clinic for new or changing lesions.  Recommend regular skin exams, daily broad-spectrum spf 30+ sunscreen use, and photoprotection.    Scar (2) L dorsal foot; L buttock  L dorsal foot - Benign, secondary from Dysplastic Nevus, mild atypia, bx by Dr. Rosemary Holms on 06/11/2012 (path report is in media).  L buttock - Benign, secondary to bx proven Benign Nevus 05/24/2014  History of Dysplastic Nevi - No evidence of recurrence today -  Recommend regular full body skin exams - Recommend daily broad spectrum sunscreen SPF 30+ to sun-exposed areas, reapply every 2 hours as needed.  - Call if any new or changing lesions are noted between office visits  - multiple  Lentigines - Scattered tan macules - Due to sun exposure - Benign-appearing, observe - Recommend daily broad spectrum sunscreen SPF 30+ to sun-exposed areas, reapply every 2 hours as needed. - Call for any changes  Seborrheic Keratoses - Stuck-on, waxy, tan-brown papules and/or plaques  - Benign-appearing - Discussed benign etiology and prognosis. - Observe - Call for any changes  Melanocytic Nevi - Tan-brown and/or pink-flesh-colored symmetric macules and papules - Benign appearing on exam today - Observation - Call clinic for new or changing moles - Recommend daily use of broad spectrum spf 30+ sunscreen to sun-exposed areas.   Hemangiomas - Red papules - Discussed benign nature - Observe - Call for any changes  Actinic Damage - Chronic condition, secondary to cumulative UV/sun exposure - diffuse scaly erythematous macules with underlying dyspigmentation - Recommend daily broad spectrum sunscreen SPF 30+ to sun-exposed areas, reapply every 2 hours as needed.  - Staying in the shade or wearing long sleeves, sun glasses (UVA+UVB protection) and wide brim hats (4-inch brim around the entire circumference of the hat) are also recommended for sun protection.  - Call for new or changing lesions.  Skin cancer screening performed today.   Return in about 6 months (around 12/20/2022) for TBSE, Hx of Melanoma, Hx of Dysplastic nevi.  I, Sonya Hupman, RMA, am acting as  scribe for Sarina Ser, MD . Documentation: I have reviewed the above documentation for accuracy and completeness, and I agree with the above.  Sarina Ser, MD

## 2022-06-19 NOTE — Patient Instructions (Signed)
Due to recent changes in healthcare laws, you may see results of your pathology and/or laboratory studies on MyChart before the doctors have had a chance to review them. We understand that in some cases there may be results that are confusing or concerning to you. Please understand that not all results are received at the same time and often the doctors may need to interpret multiple results in order to provide you with the best plan of care or course of treatment. Therefore, we ask that you please give us 2 business days to thoroughly review all your results before contacting the office for clarification. Should we see a critical lab result, you will be contacted sooner.   If You Need Anything After Your Visit  If you have any questions or concerns for your doctor, please call our main line at [phone] and press option 4 to reach your doctor's medical assistant. If no one answers, please leave a voicemail as directed and we will return your call as soon as possible. Messages left after 4 pm will be answered the following business day.   You may also send us a message via MyChart. We typically respond to MyChart messages within 1-2 business days.  For prescription refills, please ask your pharmacy to contact our office. Our fax number is [phone].  If you have an urgent issue when the clinic is closed that cannot wait until the next business day, you can page your doctor at the number below.    Please note that while we do our best to be available for urgent issues outside of office hours, we are not available 24/7.   If you have an urgent issue and are unable to reach us, you may choose to seek medical care at your doctor's office, retail clinic, urgent care center, or emergency room.  If you have a medical emergency, please immediately call 911 or go to the emergency department.  Pager Numbers  - Dr. Kowalski: [phone]  - Dr. Moye: [phone]  - Dr. Stewart:  [phone]  In the event of inclement weather, please call our main line at [phone] for an update on the status of any delays or closures.  Dermatology Medication Tips: Please keep the boxes that topical medications come in in order to help keep track of the instructions about where and how to use these. Pharmacies typically print the medication instructions only on the boxes and not directly on the medication tubes.   If your medication is too expensive, please contact our office at [phone] option 4 or send us a message through MyChart.   We are unable to tell what your co-pay for medications will be in advance as this is different depending on your insurance coverage. However, we may be able to find a substitute medication at lower cost or fill out paperwork to get insurance to cover a needed medication.   If a prior authorization is required to get your medication covered by your insurance company, please allow us 1-2 business days to complete this process.  Drug prices often vary depending on where the prescription is filled and some pharmacies may offer cheaper prices.  The website www.goodrx.com contains coupons for medications through different pharmacies. The prices here do not account for what the cost may be with help from insurance (it may be cheaper with your insurance), but the website can give you the price if you did not use any insurance.  - You can print the associated coupon and take it with   your prescription to the pharmacy.  - You may also stop by our office during regular business hours and pick up a GoodRx coupon card.  - If you need your prescription sent electronically to a different pharmacy, notify our office through [facility] MyChart or by phone at [phone] option 4.     Si Usted Necesita Algo Despus de Su Visita  Tambin puede enviarnos un mensaje a travs de MyChart. Por lo general respondemos a los mensajes de MyChart en el transcurso de 1 a 2  das hbiles.  Para renovar recetas, por favor pida a su farmacia que se ponga en contacto con nuestra oficina. Nuestro nmero de fax es el [phone].  Si tiene un asunto urgente cuando la clnica est cerrada y que no puede esperar hasta el siguiente da hbil, puede llamar/localizar a su doctor(a) al nmero que aparece a continuacin.   Por favor, tenga en cuenta que aunque hacemos todo lo posible para estar disponibles para asuntos urgentes fuera del horario de oficina, no estamos disponibles las 24 horas del da, los 7 das de la semana.   Si tiene un problema urgente y no puede comunicarse con nosotros, puede optar por buscar atencin mdica  en el consultorio de su doctor(a), en una clnica privada, en un centro de atencin urgente o en una sala de emergencias.  Si tiene una emergencia mdica, por favor llame inmediatamente al 911 o vaya a la sala de emergencias.  Nmeros de bper  - Dr. Kowalski: [phone]  - Dra. Moye: [phone]  - Dra. Stewart: [phone]  En caso de inclemencias del tiempo, por favor llame a nuestra lnea principal al [phone] para una actualizacin sobre el estado de cualquier retraso o cierre.  Consejos para la medicacin en dermatologa: Por favor, guarde las cajas en las que vienen los medicamentos de uso tpico para ayudarle a seguir las instrucciones sobre dnde y cmo usarlos. Las farmacias generalmente imprimen las instrucciones del medicamento slo en las cajas y no directamente en los tubos del medicamento.   Si su medicamento es muy caro, por favor, pngase en contacto con nuestra oficina llamando al [phone] y presione la opcin 4 o envenos un mensaje a travs de MyChart.   No podemos decirle cul ser su copago por los medicamentos por adelantado ya que esto es diferente dependiendo de la cobertura de su seguro. Sin embargo, es posible que podamos encontrar un medicamento sustituto a menor costo o llenar un formulario para que el  seguro cubra el medicamento que se considera necesario.   Si se requiere una autorizacin previa para que su compaa de seguros cubra su medicamento, por favor permtanos de 1 a 2 das hbiles para completar este proceso.  Los precios de los medicamentos varan con frecuencia dependiendo del lugar de dnde se surte la receta y alguna farmacias pueden ofrecer precios ms baratos.  El sitio web www.goodrx.com tiene cupones para medicamentos de diferentes farmacias. Los precios aqu no tienen en cuenta lo que podra costar con la ayuda del seguro (puede ser ms barato con su seguro), pero el sitio web puede darle el precio si no utiliz ningn seguro.  - Puede imprimir el cupn correspondiente y llevarlo con su receta a la farmacia.  - Tambin puede pasar por nuestra oficina durante el horario de atencin regular y recoger una tarjeta de cupones de GoodRx.  - Si necesita que su receta se enve electrnicamente a una farmacia diferente, informe a nuestra oficina a travs de MyChart de [facility]   o por telfono llamando al [phone] y presione la opcin 4.  

## 2022-06-21 ENCOUNTER — Encounter: Payer: Self-pay | Admitting: Dermatology

## 2022-07-02 ENCOUNTER — Ambulatory Visit: Payer: Medicaid Other

## 2022-07-07 ENCOUNTER — Encounter: Payer: Self-pay | Admitting: Physical Therapy

## 2022-07-17 ENCOUNTER — Telehealth: Payer: Self-pay

## 2022-07-17 DIAGNOSIS — N938 Other specified abnormal uterine and vaginal bleeding: Secondary | ICD-10-CM

## 2022-07-17 MED ORDER — ESTRADIOL 1 MG PO TABS
1.0000 mg | ORAL_TABLET | Freq: Every day | ORAL | 11 refills | Status: DC
Start: 2022-07-17 — End: 2023-11-26

## 2022-07-17 NOTE — Telephone Encounter (Signed)
VM left on nurse line requesting assistance with prescription. Called pt who states she needs refill of Estradiol. Uses for breakthrough bleeding on norethindrone when taking antibiotics. Reviewed with Currie Paris MD who gives verbal order for refill. Pt requests STD screening-- appt scheduled for 07/21/22 nurse visit. Will also collect urine culture that day as patient is experiencing frequency and sharp, stabbing pain with urination.

## 2022-07-21 ENCOUNTER — Other Ambulatory Visit: Payer: Self-pay

## 2022-07-21 ENCOUNTER — Ambulatory Visit (INDEPENDENT_AMBULATORY_CARE_PROVIDER_SITE_OTHER): Payer: Medicaid Other

## 2022-07-21 ENCOUNTER — Other Ambulatory Visit (HOSPITAL_COMMUNITY)
Admission: RE | Admit: 2022-07-21 | Discharge: 2022-07-21 | Disposition: A | Discharge status: Home / Self Care | Payer: Medicaid Other | Source: Ambulatory Visit | Attending: Family Medicine | Admitting: Family Medicine

## 2022-07-21 VITALS — BP 108/71 | HR 58 | Wt 114.9 lb

## 2022-07-21 DIAGNOSIS — Z113 Encounter for screening for infections with a predominantly sexual mode of transmission: Secondary | ICD-10-CM | POA: Insufficient documentation

## 2022-07-21 DIAGNOSIS — N949 Unspecified condition associated with female genital organs and menstrual cycle: Secondary | ICD-10-CM

## 2022-07-21 DIAGNOSIS — R350 Frequency of micturition: Secondary | ICD-10-CM

## 2022-07-21 DIAGNOSIS — R300 Dysuria: Secondary | ICD-10-CM

## 2022-07-21 NOTE — Progress Notes (Signed)
Melissa Pruitt is here with concern of urinary frequency and sharp, stabbing pain with urination. Pt self caths for each void due to neurogenic bladder. Pt will perform usual cleaning prior to cath for collection of urine culture. Also desires STD screening today with testing for yeast and BV. Reports vaginal irritation with thick, white discharge. Self swab instructions given and specimen obtained. Lab to bedside for blood draw. Explained patient will be contacted with any abnormal results following review by Briscoe Deutscher, MD. Pt will continue follow up with Uro/Gyn at yearly visit in August 2024.  Marjo Bicker, RN 07/21/2022  3:27 PM

## 2022-07-22 ENCOUNTER — Other Ambulatory Visit: Payer: Self-pay | Admitting: Obstetrics and Gynecology

## 2022-07-22 DIAGNOSIS — B3731 Acute candidiasis of vulva and vagina: Secondary | ICD-10-CM

## 2022-07-22 LAB — CERVICOVAGINAL ANCILLARY ONLY
Bacterial Vaginitis (gardnerella): NEGATIVE
Candida Glabrata: NEGATIVE
Candida Vaginitis: POSITIVE — AB
Chlamydia: NEGATIVE
Comment: NEGATIVE
Comment: NEGATIVE
Comment: NEGATIVE
Comment: NEGATIVE
Comment: NEGATIVE
Comment: NORMAL
Neisseria Gonorrhea: NEGATIVE
Trichomonas: NEGATIVE

## 2022-07-22 LAB — HEPATITIS B SURFACE ANTIGEN: Hepatitis B Surface Ag: NEGATIVE

## 2022-07-22 LAB — HIV ANTIBODY (ROUTINE TESTING W REFLEX): HIV Screen 4th Generation wRfx: NONREACTIVE

## 2022-07-22 LAB — HEPATITIS C ANTIBODY: Hep C Virus Ab: NONREACTIVE

## 2022-07-22 LAB — RPR: RPR Ser Ql: NONREACTIVE

## 2022-07-22 MED ORDER — FLUCONAZOLE 150 MG PO TABS
150.0000 mg | ORAL_TABLET | Freq: Once | ORAL | 3 refills | Status: AC
Start: 2022-07-22 — End: 2022-07-22

## 2022-07-23 ENCOUNTER — Other Ambulatory Visit: Payer: Self-pay | Admitting: Family Medicine

## 2022-07-23 ENCOUNTER — Telehealth: Payer: Self-pay | Admitting: *Deleted

## 2022-07-23 DIAGNOSIS — N3000 Acute cystitis without hematuria: Secondary | ICD-10-CM

## 2022-07-23 MED ORDER — ONDANSETRON HCL 4 MG PO TABS
4.0000 mg | ORAL_TABLET | ORAL | 0 refills | Status: DC | PRN
Start: 2022-07-23 — End: 2022-10-22

## 2022-07-23 MED ORDER — AMOXICILLIN 500 MG PO CAPS
500.0000 mg | ORAL_CAPSULE | Freq: Three times a day (TID) | ORAL | 2 refills | Status: DC
Start: 2022-07-23 — End: 2022-07-25

## 2022-07-23 NOTE — Telephone Encounter (Signed)
Melissa Pruitt called back and states she called [facility] to see if RX ready and they said issue with Zofran and need to contact us. I informed her I will call [facility] and see if we can fix the issue and call her back. I called [facility] and they needed clarification how often she can take the Zofran. I consulted with Dr. Alvester Morin who stated Zofran 1 tablet every 8 hours prn. I informed pharmacist of this order. I called Melissa Pruitt and told her the issue has been corrected and they will be getting her prescription ready soon. She voiced understanding. Nancy Fetter

## 2022-07-23 NOTE — Telephone Encounter (Signed)
Melissa Pruitt called asking about results of her urine culture. I explained it has been collected and sent out but no results yet and results take up to 72 hours because cultures are read 48-72 hours. She states she may need to go to ER because she is having her usual symptoms. 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 She states she is having symptoms of pain radiating front to back around right kidney, nausea, fiery daggers when she caths herself. She states she saw Dr. Tildon Husky last in August who prescribed Fosfomycin which held her until recently. And also sees her PCP who prescribes Amoxicillin. I discussed with Dr. Alvester Morin who spoke with patient and documented in separate note.  Nancy Fetter

## 2022-07-23 NOTE — Progress Notes (Signed)
Discussed treatment with with the client.  Recommended starting amoxicillin until the culture returns and then we can escalate to fosfomycin.  Patient requested zofran and I sent this in as well Federico Flake, MD

## 2022-07-25 ENCOUNTER — Emergency Department (HOSPITAL_COMMUNITY)
Admission: EM | Admit: 2022-07-25 | Discharge: 2022-07-25 | Disposition: A | Discharge status: Home / Self Care | Payer: Medicaid Other | Attending: Emergency Medicine | Admitting: Emergency Medicine

## 2022-07-25 ENCOUNTER — Encounter (HOSPITAL_COMMUNITY): Payer: Self-pay

## 2022-07-25 ENCOUNTER — Other Ambulatory Visit: Payer: Self-pay

## 2022-07-25 ENCOUNTER — Telehealth: Payer: Self-pay

## 2022-07-25 ENCOUNTER — Emergency Department (HOSPITAL_COMMUNITY): Payer: Medicaid Other

## 2022-07-25 DIAGNOSIS — N3000 Acute cystitis without hematuria: Secondary | ICD-10-CM | POA: Diagnosis not present

## 2022-07-25 DIAGNOSIS — R109 Unspecified abdominal pain: Secondary | ICD-10-CM | POA: Diagnosis present

## 2022-07-25 DIAGNOSIS — B379 Candidiasis, unspecified: Secondary | ICD-10-CM

## 2022-07-25 LAB — URINALYSIS, ROUTINE W REFLEX MICROSCOPIC
Bilirubin Urine: NEGATIVE
Glucose, UA: NEGATIVE mg/dL
Hgb urine dipstick: NEGATIVE
Ketones, ur: NEGATIVE mg/dL
Nitrite: NEGATIVE
Protein, ur: NEGATIVE mg/dL
Specific Gravity, Urine: 1.006 (ref 1.005–1.030)
pH: 7 (ref 5.0–8.0)

## 2022-07-25 LAB — CBC WITH DIFFERENTIAL/PLATELET
Abs Immature Granulocytes: 0.01 10*3/uL (ref 0.00–0.07)
Basophils Absolute: 0.1 10*3/uL (ref 0.0–0.1)
Basophils Relative: 1 %
Eosinophils Absolute: 0.1 10*3/uL (ref 0.0–0.5)
Eosinophils Relative: 1 %
HCT: 39.8 % (ref 36.0–46.0)
Hemoglobin: 13.7 g/dL (ref 12.0–15.0)
Immature Granulocytes: 0 %
Lymphocytes Relative: 25 %
Lymphs Abs: 2.1 10*3/uL (ref 0.7–4.0)
MCH: 32.2 pg (ref 26.0–34.0)
MCHC: 34.4 g/dL (ref 30.0–36.0)
MCV: 93.4 fL (ref 80.0–100.0)
Monocytes Absolute: 0.5 10*3/uL (ref 0.1–1.0)
Monocytes Relative: 6 %
Neutro Abs: 5.5 10*3/uL (ref 1.7–7.7)
Neutrophils Relative %: 67 %
Platelets: 255 10*3/uL (ref 150–400)
RBC: 4.26 MIL/uL (ref 3.87–5.11)
RDW: 12.8 % (ref 11.5–15.5)
WBC: 8.2 10*3/uL (ref 4.0–10.5)
nRBC: 0 % (ref 0.0–0.2)

## 2022-07-25 LAB — BASIC METABOLIC PANEL
Anion gap: 7 (ref 5–15)
BUN: 10 mg/dL (ref 6–20)
CO2: 27 mmol/L (ref 22–32)
Calcium: 9.2 mg/dL (ref 8.9–10.3)
Chloride: 104 mmol/L (ref 98–111)
Creatinine, Ser: 0.68 mg/dL (ref 0.44–1.00)
GFR, Estimated: >60 mL/min (ref >60)
Glucose, Bld: 99 mg/dL (ref 70–99)
Potassium: 4.2 mmol/L (ref 3.5–5.1)
Sodium: 138 mmol/L (ref 135–145)

## 2022-07-25 LAB — URINE CULTURE

## 2022-07-25 MED ORDER — HYDROCODONE-ACETAMINOPHEN 5-325 MG PO TABS: 1.0000 | ORAL_TABLET | Freq: Four times a day (QID) | ORAL | 0 refills | Status: DC | PRN

## 2022-07-25 MED ORDER — HYDROCODONE-ACETAMINOPHEN 5-325 MG PO TABS
1.0000 | ORAL_TABLET | Freq: Once | ORAL | Status: AC
  Administered 2022-07-25: 1 via ORAL
  Filled 2022-07-25: qty 1

## 2022-07-25 MED ORDER — FLUCONAZOLE 150 MG PO TABS
150.0000 mg | ORAL_TABLET | Freq: Once | ORAL | 1 refills | Status: AC
Start: 2022-07-25 — End: 2022-07-25

## 2022-07-25 MED ORDER — CEFDINIR 300 MG PO CAPS: 300.0000 mg | ORAL_CAPSULE | Freq: Two times a day (BID) | ORAL | 0 refills | Status: DC

## 2022-07-25 NOTE — Telephone Encounter (Signed)
Patient called nurse phone. I explained this line is for urgent clinical use only. Offered to take patient's information and return call prior to end of day. Pt states "so I should go to the emergency room." Pt states she has not received urine culture results and is in terrible pain. Brief review of results shows no urine culture result. Offered urgent care visit- patient states she has only ever been to ED. Recommended urgent care.

## 2022-07-25 NOTE — ED Provider Triage Note (Signed)
Emergency Medicine Provider Triage Evaluation Note  DEZIRAY SUMP , a 47 y.o. female  was evaluated in triage.  Patient complains of right flank pain.  Patient has history of neurogenic bladder.  Self caths 6 times a day.  About a month ago she started to have some suprapubic discomfort and bilateral flank pain.  [facility] and [facility] urine culture done but she says that she has not received any results of this.  Over the past 2 days she has gotten severe right flank pain and is concerned she has a kidney infection  Review of Systems  Positive:  Negative:   Physical Exam  BP 131/86 (BP Location: Right Arm)   Pulse 82   Temp 98.7 F (37.1 C) (Oral)   Resp 16   SpO2 97%  Gen:   Awake, no distress   Resp:  Normal effort  MSK:   Moves extremities without difficulty  Other:  Severe right-sided CVA tenderness.  No abdominal tenderness  Medical Decision Making  Medically screening exam initiated at 1:43 PM.  Appropriate orders placed.  MAYELLA CICCHINO was informed that the remainder of the evaluation will be completed by another provider, this initial triage assessment does not replace that evaluation, and the importance of remaining in the ED until their evaluation is complete.     Saddie Benders, PA-C 07/25/22 1344

## 2022-07-25 NOTE — ED Notes (Signed)
Entered pt room to reassess vital signs and pt reports, "I need to go home so I can have a bowel movement." Pt told PA should be in room in a moment. PA came in room to assess pt and pt requesting discharge. Pt out in hallway again asking for papers. Pt refused VS reassessment

## 2022-07-25 NOTE — ED Triage Notes (Addendum)
Pt came in via POV d/t a UTI that she has been on ABT for & came here to make sure it has not infected her kidneys since she has sever flank pain, Rt feeling worse than Lt, A/Ox4, rates pain 10/10. Does self cath 6 times daily d/t neurogenic bladder.

## 2022-07-25 NOTE — ED Provider Notes (Signed)
[place] EMERGENCY DEPARTMENT AT [facility] Provider Note   CSN: [id number] Arrival date & time: 07/25/22  1306     History  Chief Complaint  Patient presents with   Flank Pain   UTI    JANE BIRKEL is a 47 y.o. female.  Patient with history of neurogenic bladder, self cath, frequent UTI --presents to the emergency department for concern of pyelonephritis.  She reports flank pain and symptoms that are typical for her kidney infections.  She denies fever, vomiting or other abdominal pain.  She had a urine culture performed on 07/21/2022 by her primary care.  She started taking amoxicillin.  She has multiple medication allergies and intolerances.  She grew out E. coli in her culture that was resistant to the amoxicillin.  She was sent to the ED for consideration of IV antibiotics.       Home Medications Prior to Admission medications   Medication Sig Start Date End Date Taking? Authorizing Provider  cefdinir (OMNICEF) 300 MG capsule Take 1 capsule (300 mg total) by mouth 2 (two) times daily. 07/25/22  Yes Renne Crigler, PA-C  HYDROcodone-acetaminophen (NORCO/VICODIN) 5-325 MG tablet Take 1 tablet by mouth every 6 (six) hours as needed for severe pain. 07/25/22  Yes Renne Crigler, PA-C  conjugated estrogens (PREMARIN) vaginal cream Place 1 Applicatorful vaginally 2 (two) times a week. Place 0.5g nightly for two weeks then twice a week after 03/01/20   Marguerita Beards, MD  Cyanocobalamin (VITAMIN B-12) 1000 MCG SUBL Place under the tongue as needed.    [provider]  EPINEPHrine 0.3 mg/0.3 mL IJ SOAJ injection Inject into the muscle once.    [provider]  estradiol (ESTRACE) 1 MG tablet Take 1 tablet (1 mg total) by mouth daily. Take for 2 weeks each month as needed for breakthrough bleeding only 07/17/22   Lorriane Shire, MD  fluconazole (DIFLUCAN) 150 MG tablet Take 1 tablet (150 mg total) by mouth once for 1 dose. Pick up refill if no  improvement after 3 days of first dose. 07/25/22 07/25/22  Reva Bores, MD  fluticasone (FLONASE) 50 MCG/ACT nasal spray Place 2 sprays into the nose daily.    [provider]  hydrocortisone 2.5 % cream Apply topically at bedtime. Apply to under left arm on Monday, Wednesday and Friday. Patient not taking: Reported on 07/21/2022 06/11/20   Deirdre Evener, MD  Incontinence Supply Disposable (COMFORT PROTECT ADULT DIAPER/M) MISC 1 Device by Does not apply route as needed. 05/12/22   Selmer Dominion, NP  loratadine (CLARITIN) 10 MG tablet Take 10 mg by mouth daily.    [provider]  methocarbamol (ROBAXIN) 750 MG tablet Take 750 mg by mouth 3 (three) times daily.    [provider]  norethindrone (MICRONOR) 0.35 MG tablet Take 1 tablet (0.35 mg total) by mouth daily. 06/12/22   Lorriane Shire, MD  ondansetron (ZOFRAN) 4 MG tablet Take 1 tablet (4 mg total) by mouth as needed for nausea or vomiting. 07/23/22   Federico Flake, MD      Allergies    Ciprofloxacin, Macrobid [nitrofurantoin monohyd macro], Morphine and related, Azithromycin, Dilaudid [hydromorphone hcl], Lactose, Prednisolone, Prednisone, Rosuvastatin, Sulfa antibiotics, Tramadol hcl, Vitamin d analogs, Lidocaine, and Pork-derived products    Review of Systems   Review of Systems  Physical Exam Updated Vital Signs BP 121/62   Pulse 66   Temp 98.7 F (37.1 C) (Oral)   Resp 14   SpO2  100%  Physical Exam Vitals and nursing note reviewed.  Constitutional:      General: She is not in acute distress.    Appearance: She is well-developed.  HENT:     Head: Normocephalic and atraumatic.     Right Ear: External ear normal.     Left Ear: External ear normal.     Nose: Nose normal.  Eyes:     Conjunctiva/sclera: Conjunctivae normal.  Cardiovascular:     Rate and Rhythm: Normal rate.  Pulmonary:     Effort: No respiratory distress.     Breath sounds: No wheezing, rhonchi or rales.   Abdominal:     Palpations: Abdomen is soft.     Tenderness: There is abdominal tenderness. There is no guarding or rebound.     Comments: Mid right-sided abdominal tenderness to palpation, no rebound or guarding  Musculoskeletal:     Cervical back: Normal range of motion and neck supple.     Right lower leg: No edema.     Left lower leg: No edema.  Skin:    General: Skin is warm and dry.     Findings: No rash.  Neurological:     General: No focal deficit present.     Mental Status: She is alert. Mental status is at baseline.     Motor: No weakness.  Psychiatric:        Mood and Affect: Mood normal.     ED Results / Procedures / Treatments   Labs (all labs ordered are listed, but only abnormal results are displayed) Labs Reviewed  URINALYSIS, ROUTINE W REFLEX MICROSCOPIC - Abnormal; Notable for the following components:      Result Value   Color, Urine STRAW (*)    Leukocytes,Ua SMALL (*)    Bacteria, UA RARE (*)    All other components within normal limits  CBC WITH DIFFERENTIAL/PLATELET  BASIC METABOLIC PANEL    EKG None  Radiology CT Renal Stone Study  Result Date: 07/25/2022 CLINICAL DATA:  Right flank pain.  History of neurogenic bladder. EXAM: CT ABDOMEN AND PELVIS WITHOUT CONTRAST TECHNIQUE: Multidetector CT imaging of the abdomen and pelvis was performed following the standard protocol without IV contrast. RADIATION DOSE REDUCTION: This exam was performed according to the departmental dose-optimization program which includes automated exposure control, adjustment of the mA and/or kV according to patient size and/or use of iterative reconstruction technique. COMPARISON:  CT abdomen/pelvis 10/21/2015 FINDINGS: Lower chest: The lung bases are clear. The imaged heart is unremarkable. Hepatobiliary: The liver and gallbladder are unremarkable. There is no biliary ductal dilatation. Pancreas: Unremarkable. Spleen: Unremarkable. Adrenals/Urinary Tract: The adrenals are  unremarkable. The kidneys are unremarkable, within the confines of noncontrast technique. There is no perinephric stranding or swelling. There is no perinephric fluid collection. No stones are seen in either kidney or along the course of either ureter. The bladder is distended consistent with the history of neurogenic bladder. Stomach/Bowel: The stomach is unremarkable. There is no evidence of bowel obstruction. There is no abnormal bowel wall thickening or inflammatory change. There is colonic diverticulosis without evidence of acute diverticulitis. Vascular/Lymphatic: The abdominal aorta is normal in course and caliber with minimal calcified plaque. There is no abdominopelvic lymphadenopathy. Reproductive: The uterus and adnexa are unremarkable. Other: There is no ascites or free air. There is a small fat containing umbilical hernia, unchanged. Musculoskeletal: There is no acute osseous abnormality or suspicious osseous lesion. IMPRESSION: 1. No acute finding in the abdomen or pelvis. No secondary signs of  pyelonephritis, though this diagnosis can not be entirely excluded in the absence of intravenous contrast. 2. Diverticulosis without evidence of acute diverticulitis. Electronically Signed   By: Lesia Hausen M.D.   On: 07/25/2022 15:14    Procedures Procedures    Medications Ordered in ED Medications  HYDROcodone-acetaminophen (NORCO/VICODIN) 5-325 MG per tablet 1 tablet (1 tablet Oral Given 07/25/22 1823)    ED Course/ Medical Decision Making/ A&P    Patient seen and examined. History obtained directly from patient. Work-up including labs, imaging, EKG ordered in triage, if performed, were reviewed.    Labs/EKG: Independently reviewed and interpreted.  This included: CBC unremarkable; BMP unremarkable; UA does have a few white blood cells  Imaging: Independently reviewed and interpreted.  This included: CT abdomen pelvis without contrast, agree no acute findings  Medications/Fluids: None  ordered, offered IV antibiotics however patient would like to be discharged.  Most recent vital signs reviewed and are as follows: BP 121/62   Pulse 66   Temp 98.7 F (37.1 C) (Oral)   Resp 14   SpO2 100%   Initial impression: UTI  Upon seeing the patient, she is very anxious to leave after waiting about 8 hours and needing supplies from home.  We discussed her lab results and CT imaging results.  She is able to take Keflex and is able to take other cephalosporins.  Due to her clinical symptoms, will give a course of cefdinir.  She will follow-up with her doctor next week.  We discussed return precautions.  Home treatment plan: Initiation of cefdinir, #5 tabs Vicodin for pain per her request.   Patient counseled on use of narcotic pain medications. Counseled not to combine these medications with others containing tylenol. Urged not to drink alcohol, drive, or perform any other activities that requires focus while taking these medications. The patient verbalizes understanding and agrees with the plan.  Return instructions discussed with patient: Return with worsening uncontrolled pain, vomiting, fevers  Follow-up instructions discussed with patient: Follow-up with PCP for recheck in 3 days.                            Medical Decision Making Risk Prescription drug management.   For this patient's complaint of abdominal pain, the following conditions were considered on the differential diagnosis: gastritis/PUD, enteritis/duodenitis, appendicitis, cholelithiasis/cholecystitis, cholangitis, pancreatitis, ruptured viscus, colitis, diverticulitis, small/large bowel obstruction, proctitis, cystitis, pyelonephritis, ureteral colic, aortic dissection, aortic aneurysm. In women, ectopic pregnancy, pelvic inflammatory disease, ovarian cysts, and tubo-ovarian abscess were also considered. Atypical chest etiologies were also considered including ACS, PE, and pneumonia.  The patient's vital signs,  pertinent lab work and imaging were reviewed and interpreted as discussed in the ED course. Hospitalization was considered for further testing, treatments, or serial exams/observation. However as patient is well-appearing, has a stable exam, and reassuring studies today, I do not feel that they warrant admission at this time. This plan was discussed with the patient who verbalizes agreement and comfort with this plan and seems reliable and able to return to the Emergency Department with worsening or changing symptoms.          Final Clinical Impression(s) / ED Diagnoses Final diagnoses:  Acute cystitis without hematuria    Rx / DC Orders ED Discharge Orders          Ordered    HYDROcodone-acetaminophen (NORCO/VICODIN) 5-325 MG tablet  Every 6 hours PRN        07/25/22 2119  cefdinir (OMNICEF) 300 MG capsule  2 times daily        07/25/22 2119              Renne Crigler, Cordelia Poche 07/25/22 2129    Wynetta Fines, MD 07/28/22 [phone]

## 2022-07-25 NOTE — Discharge Instructions (Signed)
Please read and follow all provided instructions.  Your diagnoses today include:  1. Acute cystitis without hematuria     Tests performed today include: Urine test - suggests that you have an infection in your bladder Blood cell counts and electrolytes look okay CT scan of your abdomen does not show any acute findings Vital signs. See below for your results today.   Medications prescribed:  Cefdinir: Antibiotic for UTI and possible kidney infection  Vicodin (hydrocodone/acetaminophen) - narcotic pain medication  DO NOT drive or perform any activities that require you to be awake and alert because this medicine can make you drowsy. BE VERY CAREFUL not to take multiple medicines containing Tylenol (also called acetaminophen). Doing so can lead to an overdose which can damage your liver and cause liver failure and possibly death.  Home care instructions:  Follow any educational materials contained in this packet.  Follow-up instructions: Please follow-up with your primary care provider in 3 days if symptoms are not resolved for further evaluation of your symptoms.  Return instructions:  Please return to the Emergency Department if you experience worsening symptoms.  Return with fever, worsening pain, persistent vomiting, worsening pain in your back.  Please return if you have any other emergent concerns.  Additional Information:  Your vital signs today were: BP 121/62   Pulse 66   Temp 98.7 F (37.1 C) (Oral)   Resp 14   SpO2 100%  If your blood pressure (BP) was elevated above 135/85 this visit, please have this repeated by your doctor within one month. --------------

## 2022-08-04 ENCOUNTER — Ambulatory Visit: Payer: Medicaid Other | Admitting: Nurse Practitioner

## 2022-08-05 ENCOUNTER — Other Ambulatory Visit: Payer: Medicaid Other

## 2022-08-05 ENCOUNTER — Other Ambulatory Visit: Payer: Self-pay | Admitting: Nurse Practitioner

## 2022-08-05 ENCOUNTER — Other Ambulatory Visit: Payer: Self-pay

## 2022-08-05 DIAGNOSIS — Z131 Encounter for screening for diabetes mellitus: Secondary | ICD-10-CM

## 2022-08-05 DIAGNOSIS — Z1329 Encounter for screening for other suspected endocrine disorder: Secondary | ICD-10-CM

## 2022-08-05 DIAGNOSIS — I10 Essential (primary) hypertension: Secondary | ICD-10-CM

## 2022-08-05 DIAGNOSIS — E785 Hyperlipidemia, unspecified: Secondary | ICD-10-CM

## 2022-08-05 DIAGNOSIS — R197 Diarrhea, unspecified: Secondary | ICD-10-CM

## 2022-08-05 DIAGNOSIS — N390 Urinary tract infection, site not specified: Secondary | ICD-10-CM

## 2022-08-06 LAB — CMP14+EGFR
ALT: 15 IU/L (ref 0–32)
AST: 15 IU/L (ref 0–40)
Albumin/Globulin Ratio: 2.3 — ABNORMAL HIGH (ref 1.2–2.2)
Albumin: 4.8 g/dL (ref 3.9–4.9)
Alkaline Phosphatase: 38 IU/L — ABNORMAL LOW (ref 44–121)
BUN/Creatinine Ratio: 16 (ref 9–23)
BUN: 13 mg/dL (ref 6–24)
Bilirubin Total: 0.9 mg/dL (ref 0.0–1.2)
CO2: 21 mmol/L (ref 20–29)
Calcium: 9.4 mg/dL (ref 8.7–10.2)
Chloride: 102 mmol/L (ref 96–106)
Creatinine, Ser: 0.82 mg/dL (ref 0.57–1.00)
Globulin, Total: 2.1 g/dL (ref 1.5–4.5)
Glucose: 86 mg/dL (ref 70–99)
Potassium: 4.2 mmol/L (ref 3.5–5.2)
Sodium: 140 mmol/L (ref 134–144)
Total Protein: 6.9 g/dL (ref 6.0–8.5)
eGFR: 88 mL/min/{1.73_m2} (ref >59)

## 2022-08-06 LAB — LIPID PANEL
Chol/HDL Ratio: 3.5 ratio (ref 0.0–4.4)
Cholesterol, Total: 212 mg/dL — ABNORMAL HIGH (ref 100–199)
HDL: 60 mg/dL (ref >39)
LDL Chol Calc (NIH): 139 mg/dL — ABNORMAL HIGH (ref 0–99)
Triglycerides: 71 mg/dL (ref 0–149)
VLDL Cholesterol Cal: 13 mg/dL (ref 5–40)

## 2022-08-06 LAB — HEMOGLOBIN A1C
Est. average glucose Bld gHb Est-mCnc: 108 mg/dL
Hgb A1c MFr Bld: 5.4 % (ref 4.8–5.6)

## 2022-08-06 LAB — TSH: TSH: 3.2 u[IU]/mL (ref 0.450–4.500)

## 2022-08-08 ENCOUNTER — Ambulatory Visit: Payer: Medicaid Other | Admitting: Nurse Practitioner

## 2022-08-08 LAB — URINE CULTURE

## 2022-08-08 NOTE — Progress Notes (Signed)
Ok, I did not know this when we agreed to do the urine culture earlier this week.  I recommend ER for IV antibiotics since it's Friday afternoon and also follow up with Urogynecologist.  She is a more complicated case with her multiple allergies and should follow a specialist closely, not PCP.  Her urine cultures need to be done thru her specialist.

## 2022-08-08 NOTE — Progress Notes (Signed)
I'm not affiliated with the hospital so I cannot send order.  You were correct thanks

## 2022-08-10 ENCOUNTER — Emergency Department (HOSPITAL_BASED_OUTPATIENT_CLINIC_OR_DEPARTMENT_OTHER)
Admission: EM | Admit: 2022-08-10 | Discharge: 2022-08-10 | Disposition: A | Discharge status: Home / Self Care | Payer: Medicaid Other | Attending: Emergency Medicine | Admitting: Emergency Medicine

## 2022-08-10 ENCOUNTER — Encounter (HOSPITAL_BASED_OUTPATIENT_CLINIC_OR_DEPARTMENT_OTHER): Payer: Self-pay | Admitting: Emergency Medicine

## 2022-08-10 ENCOUNTER — Other Ambulatory Visit: Payer: Self-pay

## 2022-08-10 DIAGNOSIS — N3000 Acute cystitis without hematuria: Secondary | ICD-10-CM | POA: Insufficient documentation

## 2022-08-10 DIAGNOSIS — R109 Unspecified abdominal pain: Secondary | ICD-10-CM | POA: Diagnosis present

## 2022-08-10 LAB — CBC WITH DIFFERENTIAL/PLATELET
Abs Immature Granulocytes: 0.01 10*3/uL (ref 0.00–0.07)
Basophils Absolute: 0.1 10*3/uL (ref 0.0–0.1)
Basophils Relative: 1 %
Eosinophils Absolute: 0.1 10*3/uL (ref 0.0–0.5)
Eosinophils Relative: 1 %
HCT: 37.2 % (ref 36.0–46.0)
Hemoglobin: 12.8 g/dL (ref 12.0–15.0)
Immature Granulocytes: 0 %
Lymphocytes Relative: 30 %
Lymphs Abs: 2.7 10*3/uL (ref 0.7–4.0)
MCH: 31.6 pg (ref 26.0–34.0)
MCHC: 34.4 g/dL (ref 30.0–36.0)
MCV: 91.9 fL (ref 80.0–100.0)
Monocytes Absolute: 0.5 10*3/uL (ref 0.1–1.0)
Monocytes Relative: 6 %
Neutro Abs: 5.5 10*3/uL (ref 1.7–7.7)
Neutrophils Relative %: 62 %
Platelets: 215 10*3/uL (ref 150–400)
RBC: 4.05 MIL/uL (ref 3.87–5.11)
RDW: 12.3 % (ref 11.5–15.5)
WBC: 8.8 10*3/uL (ref 4.0–10.5)
nRBC: 0 % (ref 0.0–0.2)

## 2022-08-10 LAB — BASIC METABOLIC PANEL
Anion gap: 9 (ref 5–15)
BUN: 10 mg/dL (ref 6–20)
CO2: 24 mmol/L (ref 22–32)
Calcium: 9.2 mg/dL (ref 8.9–10.3)
Chloride: 105 mmol/L (ref 98–111)
Creatinine, Ser: 0.59 mg/dL (ref 0.44–1.00)
GFR, Estimated: >60 mL/min (ref >60)
Glucose, Bld: 123 mg/dL — ABNORMAL HIGH (ref 70–99)
Potassium: 3.8 mmol/L (ref 3.5–5.1)
Sodium: 138 mmol/L (ref 135–145)

## 2022-08-10 LAB — URINALYSIS, ROUTINE W REFLEX MICROSCOPIC
Bilirubin Urine: NEGATIVE
Glucose, UA: NEGATIVE mg/dL
Ketones, ur: 15 mg/dL — AB
Nitrite: POSITIVE — AB
Specific Gravity, Urine: 1.014 (ref 1.005–1.030)
WBC, UA: >50 WBC/hpf (ref 0–5)
pH: 5.5 (ref 5.0–8.0)

## 2022-08-10 MED ORDER — FENTANYL CITRATE PF 50 MCG/ML IJ SOSY
50.0000 ug | PREFILLED_SYRINGE | Freq: Once | INTRAMUSCULAR | Status: AC
  Administered 2022-08-10: 50 ug via INTRAVENOUS
  Filled 2022-08-10: qty 1

## 2022-08-10 MED ORDER — OXYCODONE HCL 5 MG PO TABS: 5.0000 mg | ORAL_TABLET | Freq: Four times a day (QID) | ORAL | 0 refills | Status: DC | PRN

## 2022-08-10 MED ORDER — SODIUM CHLORIDE 0.9 % IV SOLN
2.0000 g | Freq: Once | INTRAVENOUS | Status: AC
  Administered 2022-08-10: 2 g via INTRAVENOUS
  Filled 2022-08-10: qty 20

## 2022-08-10 MED ORDER — CEFPODOXIME PROXETIL 200 MG PO TABS: 200.0000 mg | ORAL_TABLET | Freq: Two times a day (BID) | ORAL | 0 refills | Status: AC

## 2022-08-10 MED ORDER — CEFDINIR 300 MG PO CAPS: 300.0000 mg | ORAL_CAPSULE | Freq: Two times a day (BID) | ORAL | 0 refills | Status: AC

## 2022-08-10 NOTE — ED Provider Notes (Signed)
EMERGENCY DEPARTMENT AT [facility] Provider Note   CSN: [id number] Arrival date & time: 08/10/22  1708     History  Chief Complaint  Patient presents with   Flank Pain    Melissa Pruitt is a 47 y.o. female.  She is here for evaluation of urinary tract infection.  She finished a course of cefdinir about 8 days ago.  She had a urine checked 5 days later and still culture had some E. coli in it.  She denies any fevers or chills.  She is having some flank pain which occurs when she has infection.  She had a CT scan a couple weeks ago that was unremarkable.  She has a history of neurogenic bladder.  She does self catheterizations at home.  She denies any nausea vomiting diarrhea.  The history is provided by the patient.       Home Medications Prior to Admission medications   Medication Sig Start Date End Date Taking? Authorizing Provider  cefdinir (OMNICEF) 300 MG capsule Take 1 capsule (300 mg total) by mouth 2 (two) times daily for 10 days. 08/10/22 08/20/22 Yes Adea Geisel, DO  cefpodoxime (VANTIN) 200 MG tablet Take 1 tablet (200 mg total) by mouth 2 (two) times daily for 10 days. 08/10/22 08/20/22 Yes [place] Inthavong, DO  oxyCODONE (ROXICODONE) 5 MG immediate release tablet Take 1 tablet (5 mg total) by mouth every 6 (six) hours as needed for up to 10 doses for breakthrough pain. 08/10/22  Yes Mccoy Testa, DO  conjugated estrogens (PREMARIN) vaginal cream Place 1 Applicatorful vaginally 2 (two) times a week. Place 0.5g nightly for two weeks then twice a week after 03/01/20   Marguerita Beards, MD  Cyanocobalamin (VITAMIN B-12) 1000 MCG SUBL Place under the tongue as needed.    [provider]  EPINEPHrine 0.3 mg/0.3 mL IJ SOAJ injection Inject into the muscle once.    [provider]  estradiol (ESTRACE) 1 MG tablet Take 1 tablet (1 mg total) by mouth daily. Take for 2 weeks each month as needed for breakthrough bleeding only 07/17/22    Lorriane Shire, MD  fluticasone (FLONASE) 50 MCG/ACT nasal spray Place 2 sprays into the nose daily.    [provider]  HYDROcodone-acetaminophen (NORCO/VICODIN) 5-325 MG tablet Take 1 tablet by mouth every 6 (six) hours as needed for severe pain. 07/25/22   Renne Crigler, PA-C  hydrocortisone 2.5 % cream Apply topically at bedtime. Apply to under left arm on Monday, Wednesday and Friday. Patient not taking: Reported on 07/21/2022 06/11/20   Deirdre Evener, MD  Incontinence Supply Disposable (COMFORT PROTECT ADULT DIAPER/M) MISC 1 Device by Does not apply route as needed. 05/12/22   Selmer Dominion, NP  loratadine (CLARITIN) 10 MG tablet Take 10 mg by mouth daily.    [provider]  methocarbamol (ROBAXIN) 750 MG tablet Take 750 mg by mouth 3 (three) times daily.    [provider]  norethindrone (MICRONOR) 0.35 MG tablet Take 1 tablet (0.35 mg total) by mouth daily. 06/12/22   Lorriane Shire, MD  ondansetron (ZOFRAN) 4 MG tablet Take 1 tablet (4 mg total) by mouth as needed for nausea or vomiting. 07/23/22   Federico Flake, MD      Allergies    Ciprofloxacin, Macrobid [nitrofurantoin monohyd macro], Morphine and related, Azithromycin, Dilaudid [hydromorphone hcl], Lactose, Prednisolone, Prednisone, Rosuvastatin, Sulfa antibiotics, Tramadol hcl, Vitamin d analogs, Lidocaine, and Pork-derived products    Review of Systems  Review of Systems  Physical Exam Updated Vital Signs BP 104/62   Pulse 62   Temp 98.6 F (37 C) (Oral)   Resp 20   Ht 5\' 4"  (1.626 m)   Wt 52.1 kg   SpO2 100%   BMI 19.72 kg/m  Physical Exam Vitals and nursing note reviewed.  Constitutional:      General: She is not in acute distress.    Appearance: She is well-developed. She is not ill-appearing.  HENT:     Head: Normocephalic and atraumatic.     Nose: Nose normal.     Mouth/Throat:     Mouth: Mucous membranes are moist.  Eyes:     Extraocular Movements:  Extraocular movements intact.     Conjunctiva/sclera: Conjunctivae normal.     Pupils: Pupils are equal, round, and reactive to light.  Cardiovascular:     Rate and Rhythm: Normal rate and regular rhythm.     Pulses: Normal pulses.     Heart sounds: No murmur heard. Pulmonary:     Effort: Pulmonary effort is normal. No respiratory distress.     Breath sounds: Normal breath sounds.  Abdominal:     Palpations: Abdomen is soft.     Tenderness: There is no abdominal tenderness.  Musculoskeletal:        General: No swelling.     Cervical back: Normal range of motion and neck supple.  Skin:    General: Skin is warm and dry.     Capillary Refill: Capillary refill takes less than 2 seconds.  Neurological:     General: No focal deficit present.     Mental Status: She is alert.  Psychiatric:        Mood and Affect: Mood normal.     ED Results / Procedures / Treatments   Labs (all labs ordered are listed, but only abnormal results are displayed) Labs Reviewed  BASIC METABOLIC PANEL - Abnormal; Notable for the following components:      Result Value   Glucose, Bld 123 (*)    All other components within normal limits  URINALYSIS, ROUTINE W REFLEX MICROSCOPIC - Abnormal; Notable for the following components:   APPearance CLOUDY (*)    Hgb urine dipstick SMALL (*)    Ketones, ur 15 (*)    Protein, ur TRACE (*)    Nitrite POSITIVE (*)    Leukocytes,Ua LARGE (*)    Bacteria, UA RARE (*)    All other components within normal limits  URINE CULTURE  CBC WITH DIFFERENTIAL/PLATELET    EKG None  Radiology No results found.  Procedures Procedures    Medications Ordered in ED Medications  cefTRIAXone (ROCEPHIN) 2 g in sodium chloride 0.9 % 100 mL IVPB (0 g Intravenous Stopped 08/10/22 1932)  fentaNYL (SUBLIMAZE) injection 50 mcg (50 mcg Intravenous Given 08/10/22 1856)    ED Course/ Medical Decision Making/ A&P                             Medical Decision Making Amount  and/or Complexity of Data Reviewed Labs: ordered.  Risk Prescription drug management.   [name] is here with UTI symptoms.  Finished course of antibiotics by 8 days ago.  She had a urinalysis done a couple days after that showed E. coli.  She has no fever.  Normal vitals.  Patient does self catheterizations at home for neurogenic bladder.  She finished a course of cefdinir.  She has  multiple drug allergies.  E. coli in the urine culture from a few days ago was pretty pansensitive but she has multiple drug allergies.  I talked with pharmacy on the phone and we will give her dose IV Rocephin and treat her with Vantin.  I will give her another prescription for cefdinir if she cannot afford the Vantin.  Is possible that she might just be colonized but also she might not have had a long enough course.  Will do a 10-day course per pharmacy recommendations.  Will send a new culture and try to tailor medications as needed.  Lab work showed no significant leukocytosis or anemia or electrolyte abnormality.  I am not concerned for sepsis.  She had a CT scan 2 weeks ago that was unremarkable.  Will write her for oxycodone for any breakthrough pain.  Varney Baas has been sent to pharmacy.  Patient discharged in good condition.  This chart was dictated using voice recognition software.  Despite best efforts to proofread,  errors can occur which can change the documentation meaning.         Final Clinical Impression(s) / ED Diagnoses Final diagnoses:  Acute cystitis without hematuria    Rx / DC Orders ED Discharge Orders          Ordered    cefpodoxime (VANTIN) 200 MG tablet  2 times daily        08/10/22 1833    cefdinir (OMNICEF) 300 MG capsule  2 times daily        08/10/22 1927    oxyCODONE (ROXICODONE) 5 MG immediate release tablet  Every 6 hours PRN        08/10/22 1929              Virgina Norfolk, DO 08/10/22 2017

## 2022-08-10 NOTE — ED Triage Notes (Signed)
Pt via pov from home with continuing UTI symptoms; states she was called by her GP and told her that she had continuing E-coli in her urine and needs IV antibiotics. She has been on oral antibiotics for the same. Pt alert & oriented, nad noted.,

## 2022-08-10 NOTE — Discharge Instructions (Addendum)
Overall you do not have a fever or white count.  As discussed I think it is worth trying new oral antibiotic called Vantin.  I hope that this is something that you can take.  Please strongly consider getting this antibiotic filled.  If the cost is too much I would refill cefdinir and take for 10 days.  If you develop fever, uncontrollable nausea vomiting, then I think it is reasonable that you get admitted for IV antibiotics but I still think at this time we can try to treat this with oral antibiotics.  I have prescribed you a narcotic pain medicine called Roxicodone to use for breakthrough pain.  Do not mix with alcohol or other drugs or dangerous activities including driving as this medication is sedating.

## 2022-08-11 ENCOUNTER — Encounter: Payer: Self-pay | Admitting: Nurse Practitioner

## 2022-08-11 ENCOUNTER — Ambulatory Visit: Payer: Medicaid Other | Admitting: Nurse Practitioner

## 2022-08-11 VITALS — BP 118/64 | HR 72 | Ht 64.0 in | Wt 113.0 lb

## 2022-08-11 DIAGNOSIS — J301 Allergic rhinitis due to pollen: Secondary | ICD-10-CM | POA: Diagnosis not present

## 2022-08-11 DIAGNOSIS — Q068 Other specified congenital malformations of spinal cord: Secondary | ICD-10-CM

## 2022-08-11 DIAGNOSIS — E782 Mixed hyperlipidemia: Secondary | ICD-10-CM

## 2022-08-11 DIAGNOSIS — N319 Neuromuscular dysfunction of bladder, unspecified: Secondary | ICD-10-CM

## 2022-08-11 NOTE — Progress Notes (Signed)
Established Patient Office Visit  Subjective:  Patient ID: Melissa Pruitt, female    DOB: [birth year]  Age: 47 y.o. MRN: [id number]  Chief Complaint  Patient presents with   Follow-up    3 month follow up, discuss lab results.    3 month follow up and lab results review.  Pt recently returned to ER for unresolved UTI, hx of multiple allergies.  Pt cannot see UroGyn until August 2024.      No other concerns at this time.   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 Larey Dresser   History of recurrent UTIs    HPV (human papilloma virus) infection    HSV (herpes simplex virus) infection    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[facility])    sacro/coccygeal mass excision   Melanoma (HCC) 02/16/2020   R mid plantar sole, BRESLOW'S DEPTH/MAXIMUM TUMOR THICKNESS: 0.5 MM, CLARK/ANATOMIC LEVEL: II Excised 03/20/2020   Normocytic anemia 08/21/2014   Tethered spinal cord (HCC)    Vitamin B 12 deficiency 08/21/2014    Past Surgical History:  Procedure Laterality Date   BACK SURGERY     BLADDER SURGERY     COLOSTOMY     after flex sig as 47 year old.    colostomy reversal     EXPLORATORY LAPAROTOMY     with colostomy placement   LEEP      removal of cervical cancer     TUBAL LIGATION      Social History   Socioeconomic History   Marital status: Divorced    Spouse name: Not on file   Number of children: 3   Years of education: Not on file   Highest education level: Not on file  Occupational History   Occupation: disability    Employer: OTHER  Tobacco Use   Smoking status: Former    Types: Cigarettes    Quit date: 07/14/2011    Years since quitting: 11.0   Smokeless tobacco: Never   Tobacco comments:    1-3 cigarettes  Vaping Use   Vaping Use: Never used  Substance and Sexual Activity   Alcohol use: Yes    Comment: twice a year   Drug use: No   Sexual activity: Yes    Birth control/protection: Pill, Condom  Other Topics Concern   Not on file  Social History Narrative   Not on file   Social Determinants of Health   Financial Resource Strain: Not on file  Food Insecurity: No Food Insecurity (06/12/2022)   Hunger Vital Sign    Worried About Running Out of Food in  the Last Year: Never true    Ran Out of Food in the Last Year: Never true  Transportation Needs: No Transportation Needs (06/12/2022)   PRAPARE - Administrator, Civil Service (Medical): No    Lack of Transportation (Non-Medical): No  Physical Activity: Not on file  Stress: Not on file  Social Connections: Not on file  Intimate Partner Violence: Not on file    Family History  Problem Relation Age of Onset   Hypertension Mother    Atrial fibrillation Mother    Cervical cancer Maternal Grandmother    Colon cancer Neg Hx    Breast cancer Neg Hx     Allergies  Allergen Reactions   Ciprofloxacin Anaphylaxis    Heart and nerve issues   Macrobid [Nitrofurantoin Monohyd Macro] Anaphylaxis, Hives and Shortness Of Breath   Morphine And Related Anaphylaxis   Azithromycin Nausea And Vomiting   Dilaudid [Hydromorphone Hcl] Other (See Comments)    Makes patient feel like she out of it   Lactose     Other reaction(s): Other (See  Comments) Digestive issues/pain   Prednisolone Other (See Comments)    Chest pain, left arm and hand tingling, eye pain, and mania   Prednisone Other (See Comments)   Rosuvastatin Other (See Comments)   Sulfa Antibiotics Nausea And Vomiting   Tramadol Hcl     REACTION: HEART PALPITATIONS /SWELLING /SOB   Vitamin D Analogs     Severe Diarhhea   Lidocaine Palpitations   Pork-Derived Products Nausea And Vomiting    Review of Systems  Constitutional: Negative.   HENT: Negative.    Eyes: Negative.   Respiratory: Negative.    Cardiovascular: Negative.   Gastrointestinal: Negative.   Genitourinary:  Positive for flank pain.  Skin: Negative.   Neurological: Negative.   Endo/Heme/Allergies: Negative.   Psychiatric/Behavioral: Negative.         Objective:   BP 118/64   Pulse 72   Ht 5\' 4"  (1.626 m)   Wt 113 lb (51.3 kg)   SpO2 97%   BMI 19.40 kg/m   Vitals:   08/11/22 1422  BP: 118/64  Pulse: 72  Height: 5\' 4"  (1.626 m)  Weight: 113 lb (51.3 kg)  SpO2: 97%  BMI (Calculated): 19.39    Physical Exam Vitals reviewed.  Constitutional:      Appearance: Normal appearance.  HENT:     Head: Normocephalic.     Nose: Nose normal.     Mouth/Throat:     Mouth: Mucous membranes are moist.  Eyes:     Pupils: Pupils are equal, round, and reactive to light.  Cardiovascular:     Rate and Rhythm: Normal rate and regular rhythm.  Pulmonary:     Effort: Pulmonary effort is normal.     Breath sounds: Normal breath sounds.  Abdominal:     General: Bowel sounds are normal.     Palpations: Abdomen is soft.  Musculoskeletal:        General: Normal range of motion.     Cervical back: Normal range of motion and neck supple.  Skin:    General: Skin is warm and dry.  Neurological:     Mental Status: She is alert and oriented to person, place, and time.  Psychiatric:        Mood and Affect: Mood normal.        Behavior: Behavior normal.      No results found for any visits  on 08/11/22.  Recent Results (  from the past 2160 hour(s))  Cytology - PAP( [place])     Status: None   Collection Time: 06/12/22  4:46 PM  Result Value Ref Range   High risk HPV Negative    Adequacy      Satisfactory for evaluation; transformation zone component PRESENT.   Diagnosis      - Negative for intraepithelial lesion or malignancy (NILM)   Comment Normal Reference Range HPV - Negative   Urine Culture     Status: None   Collection Time: 06/12/22  5:09 PM   Specimen: Urine, Clean Catch   Urine  Result Value Ref Range   Urine Culture, Routine Final report    Organism ID, Bacteria No growth   Cervicovaginal ancillary only( [place])     Status: Abnormal   Collection Time: 07/21/22  3:45 PM  Result Value Ref Range   Neisseria Gonorrhea Negative    Chlamydia Negative    Trichomonas Negative    Bacterial Vaginitis (gardnerella) Negative    Candida Vaginitis Positive (A)    Candida Glabrata Negative    Comment      Normal Reference Range Bacterial Vaginosis - Negative   Comment Normal Reference Range Candida Species - Negative    Comment Normal Reference Range Candida Galbrata - Negative    Comment Normal Reference Range Trichomonas - Negative    Comment Normal Reference Ranger Chlamydia - Negative    Comment      Normal Reference Range Neisseria Gonorrhea - Negative  Urine Culture     Status: Abnormal   Collection Time: 07/21/22  3:46 PM   Specimen: Blood   UR  Result Value Ref Range   Urine Culture, Routine Final report (A)    Organism ID, Bacteria Escherichia coli (A)     Comment: Greater than 100,000 colony forming units per mL   Antimicrobial Susceptibility Comment     Comment:       ** S = Susceptible; I = Intermediate; R = Resistant **                    P = Positive; N = Negative             MICS are expressed in micrograms per mL    Antibiotic                 RSLT#1    RSLT#2    RSLT#3    RSLT#4 Amoxicillin/Clavulanic Acid    I Ampicillin                      R Cefazolin                      R Cefepime                       S Ceftriaxone                    S Cefuroxime                     S Ciprofloxacin                  S Ertapenem                      S Gentamicin  S Imipenem                       S Levofloxacin                   S Meropenem                      S Nitrofurantoin                 S Piperacillin/Tazobactam        S Tetracycline                   S Tobramycin                     S Trimethoprim/Sulfa             S   RPR     Status: None   Collection Time: 07/21/22  3:51 PM  Result Value Ref Range   RPR Ser Ql Non Reactive Non Reactive  HIV antibody (with reflex)     Status: None   Collection Time: 07/21/22  3:51 PM  Result Value Ref Range   HIV Screen 4th Generation wRfx Non Reactive Non Reactive    Comment: HIV-1/HIV-2 antibodies and HIV-1 p24 antigen were NOT detected. There is no laboratory evidence of HIV infection. HIV Negative   Hepatitis C Antibody     Status: None   Collection Time: 07/21/22  3:51 PM  Result Value Ref Range   Hep C Virus Ab Non Reactive Non Reactive    Comment: HCV antibody alone does not differentiate between previously resolved infection and active infection. Equivocal and Reactive HCV antibody results should be followed up with an HCV RNA test to support the diagnosis of active HCV infection.   Hepatitis B Surface AntiGEN     Status: None   Collection Time: 07/21/22  3:51 PM  Result Value Ref Range   Hepatitis B Surface Ag Negative Negative  CBC with Differential     Status: None   Collection Time: 07/25/22  1:54 PM  Result Value Ref Range   WBC 8.2 4.0 - 10.5 K/uL   RBC 4.26 3.87 - 5.11 MIL/uL   Hemoglobin 13.7 12.0 - 15.0 g/dL   HCT 16.1 09.6 - 04.5 %   MCV 93.4 80.0 - 100.0 fL   MCH 32.2 26.0 - 34.0 pg   MCHC 34.4 30.0 - 36.0 g/dL   RDW 40.9 81.1 - 91.4 %   Platelets 255 150 - 400 K/uL   nRBC 0.0 0.0 - 0.2 %   Neutrophils Relative % 67 %   Neutro  Abs 5.5 1.7 - 7.7 K/uL   Lymphocytes Relative 25 %   Lymphs Abs 2.1 0.7 - 4.0 K/uL   Monocytes Relative 6 %   Monocytes Absolute 0.5 0.1 - 1.0 K/uL   Eosinophils Relative 1 %   Eosinophils Absolute 0.1 0.0 - 0.5 K/uL   Basophils Relative 1 %   Basophils Absolute 0.1 0.0 - 0.1 K/uL   Immature Granulocytes 0 %   Abs Immature Granulocytes 0.01 0.00 - 0.07 K/uL    Comment: Performed at [facility], [address] [place], [place] [address]  Basic metabolic panel     Status: None   Collection Time: 07/25/22  1:54 PM  Result Value Ref Range   Sodium 138 135 - 145 mmol/L   Potassium 4.2 3.5 - 5.1 mmol/L  Chloride 104 98 - 111 mmol/L   CO2 27 22 - 32 mmol/L   Glucose, Bld 99 70 - 99 mg/dL    Comment: Glucose reference range applies only to samples taken after fasting for at least 8 hours.   BUN 10 6 - 20 mg/dL   Creatinine, Ser 1.61 0.44 - 1.00 mg/dL   Calcium 9.2 8.9 - 09.6 mg/dL   GFR, Estimated >04 >54 mL/min    Comment: (NOTE) Calculated using the CKD-EPI Creatinine Equation (2021)    Anion gap 7 5 - 15    Comment: Performed at [facility], [address] [place], [place] [address]  Urinalysis, Routine w reflex microscopic -Urine, Catheterized     Status: Abnormal   Collection Time: 07/25/22  3:28 PM  Result Value Ref Range   Color, Urine STRAW (A) YELLOW   APPearance CLEAR CLEAR   Specific Gravity, Urine 1.006 1.005 - 1.030   pH 7.0 5.0 - 8.0   Glucose, UA NEGATIVE NEGATIVE mg/dL   Hgb urine dipstick NEGATIVE NEGATIVE   Bilirubin Urine NEGATIVE NEGATIVE   Ketones, ur NEGATIVE NEGATIVE mg/dL   Protein, ur NEGATIVE NEGATIVE mg/dL   Nitrite NEGATIVE NEGATIVE   Leukocytes,Ua SMALL (A) NEGATIVE   RBC / HPF 0-5 0 - 5 RBC/hpf   WBC, UA 6-10 0 - 5 WBC/hpf   Bacteria, UA RARE (A) NONE SEEN   Squamous Epithelial / HPF 0-5 0 - 5 /HPF    Comment: Performed at [facility], [address], [place], [place] [address]  Hemoglobin A1c     Status: None    Collection Time: 08/05/22 10:40 AM  Result Value Ref Range   Hgb A1c MFr Bld 5.4 4.8 - 5.6 %    Comment:          Prediabetes: 5.7 - 6.4          Diabetes: >6.4          Glycemic control for adults with diabetes: <7.0    Est. average glucose Bld gHb Est-mCnc 108 mg/dL  TSH     Status: None   Collection Time: 08/05/22 10:40 AM  Result Value Ref Range   TSH 3.200 0.450 - 4.500 uIU/mL  CMP14+EGFR     Status: Abnormal   Collection Time: 08/05/22 10:40 AM  Result Value Ref Range   Glucose 86 70 - 99 mg/dL   BUN 13 6 - 24 mg/dL   Creatinine, Ser 2.95 0.57 - 1.00 mg/dL   eGFR 88 >62 ZH/YQM/5.78   BUN/Creatinine Ratio 16 9 - 23   Sodium 140 134 - 144 mmol/L   Potassium 4.2 3.5 - 5.2 mmol/L   Chloride 102 96 - 106 mmol/L   CO2 21 20 - 29 mmol/L   Calcium 9.4 8.7 - 10.2 mg/dL   Total Protein 6.9 6.0 - 8.5 g/dL   Albumin 4.8 3.9 - 4.9 g/dL   Globulin, Total 2.1 1.5 - 4.5 g/dL   Albumin/Globulin Ratio 2.3 (H) 1.2 - 2.2   Bilirubin Total 0.9 0.0 - 1.2 mg/dL   Alkaline Phosphatase 38 (L) 44 - 121 IU/L   AST 15 0 - 40 IU/L   ALT 15 0 - 32 IU/L  Lipid panel     Status: Abnormal   Collection Time: 08/05/22 10:40 AM  Result Value Ref Range   Cholesterol, Total 212 (H) 100 - 199 mg/dL   Triglycerides 71 0 - 149 mg/dL   HDL 60 >46 mg/dL   VLDL  Cholesterol Cal 13 5 - 40 mg/dL   LDL Chol Calc (NIH) 409 (H) 0 - 99 mg/dL   Chol/HDL Ratio 3.5 0.0 - 4.4 ratio    Comment:                                   T. Chol/HDL Ratio                                             Men  Women                               1/2 Avg.Risk  3.4    3.3                                   Avg.Risk  5.0    4.4                                2X Avg.Risk  9.6    7.1                                3X Avg.Risk 23.4   11.0   Culture, Urine     Status: Abnormal   Collection Time: 08/05/22 10:46 AM   Specimen: Urine   CU  Result Value Ref Range   Urine Culture, Routine Final report (A)    Organism ID, Bacteria  Escherichia coli (A)     Comment: Greater than 100,000 colony forming units per mL   Antimicrobial Susceptibility Comment     Comment:       ** S = Susceptible; I = Intermediate; R = Resistant **                    P = Positive; N = Negative             MICS are expressed in micrograms per mL    Antibiotic                 RSLT#1    RSLT#2    RSLT#3    RSLT#4 Amoxicillin/Clavulanic Acid    I Ampicillin                     R Cefazolin                      R Cefepime                       S Ceftriaxone                    S Cefuroxime                     S Ciprofloxacin                  S Ertapenem                      S Gentamicin  S Imipenem                       S Levofloxacin                   S Meropenem                      S Nitrofurantoin                 S Piperacillin/Tazobactam        S Tetracycline                   S Tobramycin                     S Trimethoprim/Sulfa             S   CBC with Differential     Status: None   Collection Time: 08/10/22  5:29 PM  Result Value Ref Range   WBC 8.8 4.0 - 10.5 K/uL   RBC 4.05 3.87 - 5.11 MIL/uL   Hemoglobin 12.8 12.0 - 15.0 g/dL   HCT 45.4 09.8 - 11.9 %   MCV 91.9 80.0 - 100.0 fL   MCH 31.6 26.0 - 34.0 pg   MCHC 34.4 30.0 - 36.0 g/dL   RDW 14.7 82.9 - 56.2 %   Platelets 215 150 - 400 K/uL   nRBC 0.0 0.0 - 0.2 %   Neutrophils Relative % 62 %   Neutro Abs 5.5 1.7 - 7.7 K/uL   Lymphocytes Relative 30 %   Lymphs Abs 2.7 0.7 - 4.0 K/uL   Monocytes Relative 6 %   Monocytes Absolute 0.5 0.1 - 1.0 K/uL   Eosinophils Relative 1 %   Eosinophils Absolute 0.1 0.0 - 0.5 K/uL   Basophils Relative 1 %   Basophils Absolute 0.1 0.0 - 0.1 K/uL   Immature Granulocytes 0 %   Abs Immature Granulocytes 0.01 0.00 - 0.07 K/uL    Comment: Performed at [facility], [address], [place], [place] [address]  Basic metabolic panel     Status: Abnormal   Collection Time: 08/10/22  5:29 PM  Result Value  Ref Range   Sodium 138 135 - 145 mmol/L   Potassium 3.8 3.5 - 5.1 mmol/L   Chloride 105 98 - 111 mmol/L   CO2 24 22 - 32 mmol/L   Glucose, Bld 123 (H) 70 - 99 mg/dL    Comment: Glucose reference range applies only to samples taken after fasting for at least 8 hours.   BUN 10 6 - 20 mg/dL   Creatinine, Ser 5.78 0.44 - 1.00 mg/dL   Calcium 9.2 8.9 - 46.9 mg/dL   GFR, Estimated >62 >95 mL/min    Comment: (NOTE) Calculated using the CKD-EPI Creatinine Equation (2021)    Anion gap 9 5 - 15    Comment: Performed at [facility], [address] [place], [place] [address]  Urinalysis, Routine w reflex microscopic -Urine, Clean Catch     Status: Abnormal   Collection Time: 08/10/22  6:58 PM  Result Value Ref Range   Color, Urine YELLOW YELLOW   APPearance CLOUDY (A) CLEAR   Specific Gravity, Urine 1.014 1.005 - 1.030   pH 5.5 5.0 - 8.0   Glucose, UA NEGATIVE NEGATIVE mg/dL   Hgb urine dipstick SMALL (A) NEGATIVE   Bilirubin Urine NEGATIVE NEGATIVE   Ketones, ur 15 (A)  NEGATIVE mg/dL   Protein, ur TRACE (A) NEGATIVE mg/dL   Nitrite POSITIVE (A) NEGATIVE   Leukocytes,Ua LARGE (A) NEGATIVE   RBC / HPF 0-5 0 - 5 RBC/hpf   WBC, UA >50 0 - 5 WBC/hpf   Bacteria, UA RARE (A) NONE SEEN   Squamous Epithelial / HPF 0-5 0 - 5 /HPF   WBC Clumps PRESENT     Comment: Performed at [facility], [address], [place], [place] [address]      Assessment & Plan:   Problem List Items Addressed This Visit   None   No follow-ups on file.   Total time spent: 35 minutes  Orson Eva, NP  08/11/2022

## 2022-08-11 NOTE — Patient Instructions (Signed)
1) Pt on 2nd antibiotic for urine culture 2) Dyslipidemia referral  3) Follow up appt in 4 months, fasting labs prior

## 2022-08-13 LAB — URINE CULTURE: Culture: >=100000 — AB

## 2022-08-13 LAB — CLOSTRIDIUM DIFFICILE EIA: C difficile Toxins A+B, EIA: NEGATIVE

## 2022-08-14 ENCOUNTER — Telehealth (HOSPITAL_BASED_OUTPATIENT_CLINIC_OR_DEPARTMENT_OTHER): Payer: Self-pay | Admitting: *Deleted

## 2022-08-14 ENCOUNTER — Encounter (HOSPITAL_COMMUNITY): Payer: Self-pay

## 2022-08-14 ENCOUNTER — Other Ambulatory Visit: Payer: Self-pay

## 2022-08-14 ENCOUNTER — Ambulatory Visit: Payer: Medicaid Other

## 2022-08-14 ENCOUNTER — Emergency Department (HOSPITAL_COMMUNITY)
Admission: EM | Admit: 2022-08-14 | Discharge: 2022-08-14 | Disposition: A | Discharge status: Home / Self Care | Payer: Medicaid Other | Attending: Emergency Medicine | Admitting: Emergency Medicine

## 2022-08-14 DIAGNOSIS — R109 Unspecified abdominal pain: Secondary | ICD-10-CM | POA: Diagnosis present

## 2022-08-14 DIAGNOSIS — R899 Unspecified abnormal finding in specimens from other organs, systems and tissues: Secondary | ICD-10-CM

## 2022-08-14 DIAGNOSIS — R799 Abnormal finding of blood chemistry, unspecified: Secondary | ICD-10-CM | POA: Insufficient documentation

## 2022-08-14 LAB — CBC
HCT: 41.4 % (ref 36.0–46.0)
Hemoglobin: 14 g/dL (ref 12.0–15.0)
MCH: 31.3 pg (ref 26.0–34.0)
MCHC: 33.8 g/dL (ref 30.0–36.0)
MCV: 92.6 fL (ref 80.0–100.0)
Platelets: 221 10*3/uL (ref 150–400)
RBC: 4.47 MIL/uL (ref 3.87–5.11)
RDW: 12.4 % (ref 11.5–15.5)
WBC: 7.1 10*3/uL (ref 4.0–10.5)
nRBC: 0 % (ref 0.0–0.2)

## 2022-08-14 LAB — COMPREHENSIVE METABOLIC PANEL
ALT: 12 U/L (ref 0–44)
AST: 15 U/L (ref 15–41)
Albumin: 4.8 g/dL (ref 3.5–5.0)
Alkaline Phosphatase: 27 U/L — ABNORMAL LOW (ref 38–126)
Anion gap: 8 (ref 5–15)
BUN: 13 mg/dL (ref 6–20)
CO2: 26 mmol/L (ref 22–32)
Calcium: 9.2 mg/dL (ref 8.9–10.3)
Chloride: 103 mmol/L (ref 98–111)
Creatinine, Ser: 0.64 mg/dL (ref 0.44–1.00)
GFR, Estimated: >60 mL/min (ref >60)
Glucose, Bld: 124 mg/dL — ABNORMAL HIGH (ref 70–99)
Potassium: 3.8 mmol/L (ref 3.5–5.1)
Sodium: 137 mmol/L (ref 135–145)
Total Bilirubin: 0.5 mg/dL (ref 0.3–1.2)
Total Protein: 7.5 g/dL (ref 6.5–8.1)

## 2022-08-14 LAB — URINALYSIS, ROUTINE W REFLEX MICROSCOPIC
Bilirubin Urine: NEGATIVE
Glucose, UA: NEGATIVE mg/dL
Hgb urine dipstick: NEGATIVE
Ketones, ur: NEGATIVE mg/dL
Leukocytes,Ua: NEGATIVE
Nitrite: NEGATIVE
Protein, ur: NEGATIVE mg/dL
Specific Gravity, Urine: 1.003 — ABNORMAL LOW (ref 1.005–1.030)
pH: 7 (ref 5.0–8.0)

## 2022-08-14 LAB — LACTIC ACID, PLASMA: Lactic Acid, Venous: 1.2 mmol/L (ref 0.5–1.9)

## 2022-08-14 NOTE — ED Provider Notes (Signed)
[place] [facility] DEPARTMENT AT [facility] Provider Note   CSN: [id number] Arrival date & time: 08/14/22  1118     History  Chief Complaint  Patient presents with   Flank Pain    Melissa Pruitt is a 47 y.o. female with history of neurogenic bladder, recurrent urine infection, presented to ED with concern for persistent urine infection.  The patient has had multiple positive urine cultures performed this month.  She self catheterizes at home for her neurogenic bladder.   Most recently she was seen in the ER on 08/10/22 growing 100K citrobacter freundii that was pansensitive, and started on Vantin, which she was taking at home.  She reports that she was contacted by the staff here and told she "needs to come to the ER for IV antibiotics."  Urine cx from 08/05/22 growing 100K E. Coli     Antibiotic                 RSLT#1    RSLT#2    RSLT#3    RSLT#4  Amoxicillin/Clavulanic Acid    I  Ampicillin                     R  Cefazolin                      R  Cefepime                       S  Ceftriaxone                    S  Cefuroxime                     S  Ciprofloxacin                  S  Ertapenem                      S  Gentamicin                     S  Imipenem                       S  Levofloxacin                   S  Meropenem                      S  Nitrofurantoin                 S  Piperacillin/Tazobactam        S  Tetracycline                   S  Tobramycin                     S  Trimethoprim/Sulfa             S   Patient reports in the past when she saw urogynecology she was on fosfomycin prophylactically, once every 10 days, and that this was very effective in preventing her urinary tract infections.  She unfortunately has an extensive list of drug allergies, which include sulfa containing medications, which she states causes anaphylaxis.  She does not tolerate Bactrim.  She also does not tolerate fluoroquinolones, which caused anaphylaxis, and  nitrofurantoin.  HPI     Home Medications Prior to Admission medications   Medication Sig Start Date End Date Taking? Authorizing Provider  cefdinir (OMNICEF) 300 MG capsule Take 1 capsule (300 mg total) by mouth 2 (two) times daily for 10 days. Patient not taking: Reported on 08/11/2022 08/10/22 08/20/22  Virgina Norfolk, DO  cefpodoxime (VANTIN) 200 MG tablet Take 1 tablet (200 mg total) by mouth 2 (two) times daily for 10 days. 08/10/22 08/20/22  Curatolo, Adam, DO  conjugated estrogens (PREMARIN) vaginal cream Place 1 Applicatorful vaginally 2 (two) times a week. Place 0.5g nightly for two weeks then twice a week after 03/01/20   Marguerita Beards, MD  Cyanocobalamin (VITAMIN B-12) 1000 MCG SUBL Place under the tongue as needed.    [provider]  EPINEPHrine 0.3 mg/0.3 mL IJ SOAJ injection Inject into the muscle once.    [provider]  estradiol (ESTRACE) 1 MG tablet Take 1 tablet (1 mg total) by mouth daily. Take for 2 weeks each month as needed for breakthrough bleeding only 07/17/22   Lorriane Shire, MD  fluticasone (FLONASE) 50 MCG/ACT nasal spray Place 2 sprays into the nose daily.    [provider]  HYDROcodone-acetaminophen (NORCO/VICODIN) 5-325 MG tablet Take 1 tablet by mouth every 6 (six) hours as needed for severe pain. Patient not taking: Reported on 08/11/2022 07/25/22   Renne Crigler, PA-C  hydrocortisone 2.5 % cream Apply topically at bedtime. Apply to under left arm on Monday, Wednesday and Friday. Patient not taking: Reported on 07/21/2022 06/11/20   Deirdre Evener, MD  Incontinence Supply Disposable (COMFORT PROTECT ADULT DIAPER/M) MISC 1 Device by Does not apply route as needed. 05/12/22   Selmer Dominion, NP  loratadine (CLARITIN) 10 MG tablet Take 10 mg by mouth daily.    [provider]  methocarbamol (ROBAXIN) 750 MG tablet Take 750 mg by mouth 3 (three) times daily.    [provider]  norethindrone (MICRONOR) 0.35 MG  tablet Take 1 tablet (0.35 mg total) by mouth daily. 06/12/22   Lorriane Shire, MD  ondansetron (ZOFRAN) 4 MG tablet Take 1 tablet (4 mg total) by mouth as needed for nausea or vomiting. 07/23/22   Federico Flake, MD  oxyCODONE (ROXICODONE) 5 MG immediate release tablet Take 1 tablet (5 mg total) by mouth every 6 (six) hours as needed for up to 10 doses for breakthrough pain. 08/10/22   Curatolo, Adam, DO      Allergies    Ciprofloxacin, Macrobid [nitrofurantoin monohyd macro], Morphine and related, Sulfa antibiotics, Azithromycin, Dilaudid [hydromorphone hcl], Lactose, Prednisolone, Prednisone, Rosuvastatin, Tramadol hcl, Vitamin d analogs, Lidocaine, and Pork-derived products    Review of Systems   Review of Systems  Physical Exam Updated Vital Signs BP 114/64   Pulse 63   Temp 98.5 F (36.9 C) (Oral)   Resp 18   SpO2 97%  Physical Exam Constitutional:      General: She is not in acute distress. HENT:     Head: Normocephalic and atraumatic.  Eyes:     Conjunctiva/sclera: Conjunctivae normal.     Pupils: Pupils are equal, round, and reactive to light.  Cardiovascular:     Rate and Rhythm: Normal rate and regular rhythm.  Pulmonary:     Effort: Pulmonary effort is normal. No respiratory distress.  Skin:    General: Skin is warm and dry.  Neurological:     General: No focal deficit present.     Mental Status: She  is alert and oriented to person, place, and time. Mental status is at baseline.  Psychiatric:        Mood and Affect: Mood normal.        Behavior: Behavior normal.     ED Results / Procedures / Treatments   Labs (all labs ordered are listed, but only abnormal results are displayed) Labs Reviewed  COMPREHENSIVE METABOLIC PANEL - Abnormal; Notable for the following components:      Result Value   Glucose, Bld 124 (*)    Alkaline Phosphatase 27 (*)    All other components within normal limits  URINALYSIS, ROUTINE W REFLEX MICROSCOPIC - Abnormal;  Notable for the following components:   Color, Urine STRAW (*)    Specific Gravity, Urine 1.003 (*)    All other components within normal limits  CULTURE, BLOOD (ROUTINE X 2)  CULTURE, BLOOD (ROUTINE X 2)  CBC  LACTIC ACID, PLASMA    EKG None  Radiology No results found.  Procedures Procedures    Medications Ordered in ED Medications - No data to display  ED Course/ Medical Decision Making/ A&P                             Medical Decision Making Amount and/or Complexity of Data Reviewed Labs: ordered.   This patient presents to the ED with concern for abnormal lab results.   Patient reporting she was told to come to ED for "IV antibiotics" by the ED staff or pharmacist, as well as her PCP's office.  I spoke to her PCP who says she was echoing the message from the ED, but had no additional information.  From reviewing her prior workup, it appears Vantin would be a good choice to treat both urinary infection organisms from 4/23 and 4/28.  Her current UA appears clear.  I advised she continue and complete this course of oral antibiotics.  I do not see an indication for IV antibiotics or new antibiotics at this time.  She is comfortable with this plan.  *  Co-morbidities that complicate the patient evaluation: neurogenic bladder at risk of infections  External records from outside source obtained and reviewed including urine cx results from this month  I ordered and personally interpreted labs.  The pertinent results include:  UA without evidence of infection.  WBC and lactate wnl.  Doubt bacteremia (blood cx drawn per triage orders)  The patient was maintained on a cardiac monitor.  I personally viewed and interpreted the cardiac monitored which showed an underlying rhythm of: NSR  Test Considered: no indication for CT imaging of the abdomen at this time.  After the interventions noted above, I reevaluated the patient and found that they have: stayed the  same   Dispostion:  After consideration of the diagnostic results and the patients response to treatment, I feel that the patent would benefit from outpatient f/u          Final Clinical Impression(s) / ED Diagnoses Final diagnoses:  Abnormal laboratory test    Rx / DC Orders ED Discharge Orders     None         Khalia Gong, Kermit Balo, MD 08/14/22 1814

## 2022-08-14 NOTE — Progress Notes (Signed)
ED Antimicrobial Stewardship Positive Culture Follow Up   Melissa Pruitt is an 47 y.o. female who presented to [facility] on 08/10/2022 with a chief complaint of  Chief Complaint  Patient presents with   Flank Pain    Recent Results (from the past 720 hour(s))  Urine Culture     Status: Abnormal   Collection Time: 07/21/22  3:46 PM   Specimen: Blood   UR  Result Value Ref Range Status   Urine Culture, Routine Final report (A)  Final   Organism ID, Bacteria Escherichia coli (A)  Final    Comment: Greater than 100,000 colony forming units per mL   Antimicrobial Susceptibility Comment  Final    Comment:       ** S = Susceptible; I = Intermediate; R = Resistant **                    P = Positive; N = Negative             MICS are expressed in micrograms per mL    Antibiotic                 RSLT#1    RSLT#2    RSLT#3    RSLT#4 Amoxicillin/Clavulanic Acid    I Ampicillin                     R Cefazolin                      R Cefepime                       S Ceftriaxone                    S Cefuroxime                     S Ciprofloxacin                  S Ertapenem                      S Gentamicin                     S Imipenem                       S Levofloxacin                   S Meropenem                      S Nitrofurantoin                 S Piperacillin/Tazobactam        S Tetracycline                   S Tobramycin                     S Trimethoprim/Sulfa             S   Culture, Urine     Status: Abnormal   Collection Time: 08/05/22 10:46 AM   Specimen: Urine   CU  Result Value Ref Range Status   Urine Culture, Routine Final report (A)  Final   Organism ID, Bacteria Escherichia coli (A)  Final  Comment: Greater than 100,000 colony forming units per mL   Antimicrobial Susceptibility Comment  Final    Comment:       ** S = Susceptible; I = Intermediate; R = Resistant **                    P = Positive; N = Negative             MICS are expressed in micrograms  per mL    Antibiotic                 RSLT#1    RSLT#2    RSLT#3    RSLT#4 Amoxicillin/Clavulanic Acid    I Ampicillin                     R Cefazolin                      R Cefepime                       S Ceftriaxone                    S Cefuroxime                     S Ciprofloxacin                  S Ertapenem                      S Gentamicin                     S Imipenem                       S Levofloxacin                   S Meropenem                      S Nitrofurantoin                 S Piperacillin/Tazobactam        S Tetracycline                   S Tobramycin                     S Trimethoprim/Sulfa             S   Urine Culture     Status: Abnormal   Collection Time: 08/10/22  6:58 PM   Specimen: Urine, Clean Catch  Result Value Ref Range Status   Specimen Description   Final    URINE, CLEAN CATCH Performed at [facility], [address] [place], [place] [address]    Special Requests   Final    NONE Performed at [facility], [address] [place], [place] [address]    Culture >=100,000 COLONIES/mL CITROBACTER FREUNDII (A)  Final   Report Status 08/13/2022 FINAL  Final   Organism ID, Bacteria CITROBACTER FREUNDII (A)  Final      Susceptibility   Citrobacter freundii - MIC*    CEFEPIME <=0.12 SENSITIVE Sensitive     CEFTRIAXONE <=0.25 SENSITIVE Sensitive     CIPROFLOXACIN <=0.25 SENSITIVE Sensitive     GENTAMICIN <=1 SENSITIVE Sensitive  IMIPENEM 1 SENSITIVE Sensitive     NITROFURANTOIN <=16 SENSITIVE Sensitive     TRIMETH/SULFA <=20 SENSITIVE Sensitive     PIP/TAZO <=4 SENSITIVE Sensitive     * >=100,000 COLONIES/mL CITROBACTER FREUNDII  Clostridium difficile Toxin A/B     Status: None   Collection Time: 08/11/22  3:01 PM   Specimen: Stool   ST  Result Value Ref Range Status   C difficile Toxins A+B, EIA Negative Negative Final    [x]  Treated with cefpodoxime, organism resistant to prescribed  antimicrobial  New antibiotic prescription: Stop cefpodoxime (or cefdinir if still taking). Start sulfamethoxazole/trimethoprim 1 DS tablet PO BID x 10 days. If unable to tolerate sulfamethoxazole/trimethoprim (hx N/V reported) and urinary symptoms not resolved, will likely need to return to ER for IV antibiotics.  ED Provider: Glynn Octave, MD   Susa Griffins Dohlen 08/14/2022, 8:18 AM Clinical Pharmacist Monday - Friday phone -  [phone] Saturday - Sunday phone - [phone]

## 2022-08-14 NOTE — Telephone Encounter (Signed)
Post ED Visit - Positive Culture Follow-up: Successful Patient Follow-Up  Culture assessed and recommendations reviewed by:  [x]  Francene Finders [place] Pharm.D. []  Celedonio Miyamoto, Pharm.D., BCPS AQ-ID []  Garvin Fila, Pharm.D., BCPS []  Georgina Pillion, Pharm.D., BCPS []  [place], [place].D., BCPS, AAHIVP []  Estella Husk, Pharm.D., BCPS, AAHIVP []  [name], PharmD, BCPS []  Phillips Climes, PharmD, BCPS []  Agapito Games, PharmD, BCPS []  Verlan Friends, PharmD  Positive urine culture  []  Patient discharged without antimicrobial prescription and treatment is now indicated [x]  Organism is resistant to prescribed ED discharge antimicrobial []  Patient with positive blood cultures  Changes discussed with ED provider: Glynn Octave, [facility] Recommend Bactrim 1 DS tablet PO BID x 10 days.  If unable to tolerate Bactrim from N/V perspective and not feeling better, return to ED for IV antibiotics.  Per pt she is unable to take Bactrim and will return to ED for further evaluation and possible IV antibiotic therapy.   Contacted patient, date 08/14/2022, time 1015   [name] 08/14/2022, 10:19 AM

## 2022-08-14 NOTE — Progress Notes (Signed)
I already saw and it should (unless resistance has built up)

## 2022-08-14 NOTE — ED Triage Notes (Signed)
C/o flank pain  Pt reports she has to self cath and reports had urine culture performed and told to come to ER for IV abx due to unable to take bactrim.  Pt reports finishing cefdinir about 2 weeks ago.

## 2022-08-14 NOTE — Discharge Instructions (Addendum)
Urinalysis today did not show signs of urinary tract infection.  You should continue the Vantin antibiotic that was prescribed from the emergency department this week until you complete the full course.

## 2022-08-15 LAB — CULTURE, BLOOD (ROUTINE X 2): Culture: NO GROWTH

## 2022-08-16 LAB — CULTURE, BLOOD (ROUTINE X 2): Culture: NO GROWTH

## 2022-08-17 LAB — CULTURE, BLOOD (ROUTINE X 2): Special Requests: ADEQUATE

## 2022-08-19 LAB — CULTURE, BLOOD (ROUTINE X 2)

## 2022-08-20 ENCOUNTER — Ambulatory Visit: Payer: Medicaid Other | Attending: Neurology

## 2022-08-20 ENCOUNTER — Other Ambulatory Visit: Payer: Self-pay

## 2022-08-20 DIAGNOSIS — R293 Abnormal posture: Secondary | ICD-10-CM | POA: Diagnosis present

## 2022-08-20 DIAGNOSIS — R102 Pelvic and perineal pain: Secondary | ICD-10-CM | POA: Insufficient documentation

## 2022-08-20 DIAGNOSIS — M6281 Muscle weakness (generalized): Secondary | ICD-10-CM

## 2022-08-20 DIAGNOSIS — M5459 Other low back pain: Secondary | ICD-10-CM | POA: Diagnosis present

## 2022-08-20 DIAGNOSIS — M62838 Other muscle spasm: Secondary | ICD-10-CM

## 2022-08-20 DIAGNOSIS — R279 Unspecified lack of coordination: Secondary | ICD-10-CM | POA: Diagnosis present

## 2022-08-20 NOTE — Therapy (Signed)
OUTPATIENT PHYSICAL THERAPY FEMALE PELVIC EVALUATION   Patient Name: Melissa Pruitt MRN: [id number] DOB:1975/10/26, 47 y.o., female Today's Date: 08/20/2022  END OF SESSION:  PT End of Session - 08/20/22 1619     Visit Number 1    Date for PT Re-Evaluation 02/04/23    Authorization Type [employer]    PT Start Time 1615    PT Stop Time 1655    PT Time Calculation (min) 40 min    Activity Tolerance Patient tolerated treatment well    Behavior During Therapy [facility] for tasks assessed/performed             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   R mid sole superior moderate atypia    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   R med sole inferior moderate atypia    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   R dorsum lat great toe   Dysplastic nevus 06/11/2020   left prox lat thigh sup, - severe   Dysplastic nevus 06/11/2020   left prox lat thigh inf, - moderate   Dysplastic nevus 06/11/2020   R mid to upper back 2.0cm lat to spine, Severe atypia   Dysplastic nevus 06/11/2020   R distal post thigh, moderate atypia   Dysplastic nevus 08/23/2020   Right labia - mild   Dysplastic nevus 08/12/2021   R lat neck post, moderate atypia   Dysplastic nevus 06/11/2012   L dorsal foot, mild atypia, bx from Dr. 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   Other specified disorders of uterus 10/12/2015   Herpes simplex 06/27/2015   Hyperlipidemia 05/30/2015   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   Normocytic anemia 08/21/2014   Vitamin B 12 deficiency 08/21/2014   Allergic rhinitis 01/19/2014   Condyloma acuminatum 01/09/2014   Chronic pain 08/09/2013   Cervical dysplasia 05/09/2013   Other headache syndrome 05/09/2013   Arthralgia of temporomandibular joint 05/09/2013   Cervical pain 08/25/2012   Pain in joint, pelvic region and thigh 07/16/2012   Tethered spinal cord (HCC) 07/16/2012  Balance disorder 07/16/2012   Personal history of fall 07/16/2012   Neurogenic bladder 06/25/2011   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   OTHER SPECIFIED CONGENITAL ANOMALY SPINAL CORD 05/08/2010   UNSPEC CONGN ANOMALY BRAIN SP CORD&NERV SYSTEM 05/08/2010   H N P-LUMBAR 04/23/2010   BACK PAIN 01/28/2010   Papanicolaou smear of cervix with atypical squamous cells cannot exclude high grade squamous intraepithelial lesion (ASC-H) 04/14/1898    PCP: Orson Eva, NP  REFERRING PROVIDER: Lonell Face, MD  REFERRING DIAG: R10.2 Pelvic pain  THERAPY DIAG:  Pelvic pain  Abnormal posture  Muscle weakness (generalized)  Unspecified lack of coordination  Other muscle spasm  Other low back pain  Rationale  for Evaluation and Treatment: Rehabilitation  ONSET DATE: chronic  SUBJECTIVE:                                                                                                                                                                                            SUBJECTIVE STATEMENT: Pt self-caths 6x/day to urinate; self-brace to have bowel movement (puts glove on with thumb in vagina and pushes with thumb while pulling anus open with index finger) 6x/day which she refers to as emptying colon that is different from a bowel movement - she does this 6x a day in order to prevent a mega colon.   Chronic history of pelvic floor dysfunction, rt hip and back and pelvic pain. Pt reports she has had a lot of PT previously and has had PFPT prior as well.  Does wear pull ups all day everyday - only leaking fecal matter but no urine. She has pelvic floor wand put has not been using.    Has had multiple falls over time and hit tailbone a lot per pt, x-ray was (-) recently but reports this area has been more painful lately.      PAIN:  Are you having pain? Yes NPRS scale: 7-8/10 currently; 10+/10 at worst Pain location: Rt ischial tuberosity with pain down lateral thigh to knee, Rt groin/anterior hip/abdomen, Rt low back   Pain type: aching, burning, dull, and stabbing  Pain description: constant (but level varies)   Aggravating factors: standing, sitting more than 30 mins Relieving factors: rest, muscle relaxer, taping, pelvic stability belt; popping, pillows for sleeping   PRECAUTIONS: Fall   WEIGHT BEARING RESTRICTIONS No   FALLS:  Has patient fallen in last 6 months? Yes. Number of falls 2   LIVING ENVIRONMENT: Lives with: lives with their family Lives in: House/apartment     OCCUPATION: disabled   PLOF: Independent   PATIENT GOALS to have less pain  PERTINENT HISTORY:  Myelomeningocele with surgical repair,  self-catheterizes for neurogenic bladder, Cervical cancer, HPV ,  recurrent UTIs, HSV , chronic rt hip pain, melanoma sle of Rt foot has been excised but has foot sensory changes, cervical radiculopathy, trigeminal neuralgia, grade 1 carpal tunnel, left mild chronic C7 radiculopathy   Sexual abuse: No   BOWEL MOVEMENT Pain with bowel movement: No Type of bowel movement:Type (Bristol Stool Scale) type 1-2 usually, will have diarrhea intermittently but thinks a lot of this is more about constipation and low motility, Frequency empty bowels every time caths, and usually this is enough unless has diarrhea, and Strain No Fully empty rectum: No does not have urge regularly, does have increased pressure in low back.   Leakage: Yes: with diarrhea wears AAT Pads: Yes: pull ups AAT Fiber supplement: No   URINATION Pain with urination: No Fully empty bladder: No does self cath to empty Stream: self-cath Urgency: No has no urge to have urine void Frequency: self caths 6x per day Leakage:  none Pads: No   INTERCOURSE Pain with intercourse:  not painful internally but will have pain at Rt pelvis or hip Ability to have vaginal penetration:  Yes:   Climax: yes not painful Marinoff Scale: 0/3   PREGNANCY Vaginal deliveries 3 Tearing Yes: tearing with first with stiches, tearing with second C-section deliveries 0 Currently pregnant No   PROLAPSE None       OBJECTIVE:   PATIENT SURVEYS:  [id number]     COGNITION:            Overall cognitive status: Within functional limits for tasks assessed                          SENSATION:            Light touch: Deficits Rt flank down to toes has decreased sensation, decreased sensation at external genitalis             Proprioception: Deficits Rt side decreased    MUSCLE LENGTH: Rt hamstring and adductor limited by 50%, Lt 25%  GAIT: Comments: decreased mobility at Rt LE, foot drop Rt foot, decreased flexion of Rt knee, hip circumduction noted with gait at Rt, decreased cadence   POSTURE: rounded  shoulders, forward head, and posterior pelvic tilt; Lt posterior/Rt anterior rotation; Slight Lt iliac crest elevation  FUNCTIONAL TASK: Lunge: bil instability present, pain with Rt   LUMBARAROM/PROM   A/PROM A/PROM  Eval (% available)  Flexion WFL  Extension 50 with pain Rt  Right lateral flexion WNL  Left lateral flexion 50 limited by Rt side  Right rotation WNL  Left rotation 50 limtied by Rt side   (Blank rows = not tested)   LOWER EXTREMITY ROM:   WFL but tension felt pt pain at Rt side   LOWER EXTREMITY MMT:   MMT Right Eval All out of 5 Left Eval All out of 5  Hip flexion 2 4  Hip extension 3 4  Hip abduction 2+ 4  Hip adduction 3 4  Hip internal rotation  4 4  Hip external rotation  4 4  Knee flexion 3+ 4+  Knee extension 2+ 4+  Ankle dorsiflexion 0 4  Ankle plantarflexion 1+ 4  Ankle inversion 1+ 4  Ankle eversion 1+ 4    PALPATION: General: Pain throughout Rt lower quadrant, flank, low back; significant scar tissue restriction across low back/sacrum    External Perineal Exam: deferred  to next session                Internal Pelvic Floor :deferred to next session    Patient confirms identification and approves PT to assess internal pelvic floor and treatment Yes-next session    PELVIC MMT:   MMT eval  Vaginal    Internal Anal Sphincter    External Anal Sphincter    Puborectalis    Diastasis Recti    (Blank rows = not tested)         TONE: deferred to next session    PROLAPSE: deferred to next session    TODAY'S TREATMENT  08/20/22 EVAL Examination completed, findings reviewed, pt educated on POC.  Pt motivated to participate in PT and agreeable to attempt recommendations.    Check all possible CPT codes: 16109 - Therapeutic Activities    Check all conditions that are expected to impact treatment: {Conditions expected to impact treatment:Neurological condition and/or seizures   If treatment provided at initial evaluation, no treatment  charged due to lack of authorization.            PATIENT EDUCATION:  Education details: See above Person educated: Patient Education method: Explanation, Demonstration, Tactile cues, Verbal cues, and Handouts Education comprehension: verbalized understanding and returned demonstration     HOME EXERCISE PROGRAM:    ASSESSMENT:   CLINICAL IMPRESSION: Patient is a 47 y.o. female  who was seen today for physical therapy evaluation and treatment for pelvic pain. Exam findings notable for decreased lumbar A/ROM, bil hip/core weakness (Rt>Lt), decreased sensation Rt LE, scar tissue restriction over low back/sacrum, tenderness to palpation throughout Rt lower quadrant, functional limitation and altered movement patterns; no internal pelvic pelvic floor examination performed this treatment session due to time constraints, but we will plan to perform next treatment session. Signs and symptoms are most consistent with generalized hypermobility, compensatory muscle spasm, and lack of muscular support surrounding Rt pelvis due to neurological component. She will continue to benefit from skilled PT intervention in order to decrease pain, begin/progress functional strengthening program, improve bowel movements, and improve quality of life.      OBJECTIVE IMPAIRMENTS decreased activity tolerance, decreased coordination, decreased endurance, decreased mobility, difficulty walking, decreased strength, increased fascial restrictions, increased muscle spasms, impaired flexibility, improper body mechanics, postural dysfunction, and pain.    ACTIVITY LIMITATIONS carrying, lifting, sitting, standing, squatting, stairs, continence, toileting, and locomotion level   PARTICIPATION LIMITATIONS: meal prep, cleaning, interpersonal relationship, driving, shopping, community activity, and yard work   PERSONAL FACTORS Past/current experiences, Time since onset of injury/illness/exacerbation, and 3+ comorbidities:  medical history  are also affecting patient's functional outcome.    REHAB POTENTIAL: Good   CLINICAL DECISION MAKING: Evolving/moderate complexity   EVALUATION COMPLEXITY: Moderate     GOALS: Goals reviewed with patient? Yes   SHORT TERM GOALS: Target date: 09/24/22   Pt to be I with HEP.  Baseline: Goal status: INITIAL   2.  Pt will report 25% reduction of pain due to improvements in posture, strength, and muscle length  Baseline: 10/10 Goal status: INITIAL   3.  Pt will report her BMs and bladder voids are complete due to improved bowel habits and evacuation techniques.  Baseline:  Goal status: INITIAL     LONG TERM GOALS: Target date: 02/04/23     Pt to be I with advanced HEP.  Baseline:  Goal status: INITIAL   2.  Pt will report 50% reduction of pain due to improvements in posture, strength, and  muscle length  Baseline:  Goal status: INITIAL   3.  Pt to be I with pelvic wand/dilator use for pain management outside of PT.  Baseline:  Goal status: INITIAL   4.  Pt to be I with abdominal massage, voiding mechanics, breathing mechanics for improved pelvic relaxation and decreased pain.  Baseline:  Goal status: INITIAL   5.  Pt to demonstrated improved coordination of pelvic floor and breathing mechanics with functional squat with no increase in pain. Baseline:  Goal status: INITIAL   6.  Pt to demonstrate at least 3/5 pelvic floor strength and ability to fully relax post contraction for improved pelvic stability and decreased strain at pelvic floor. Baseline:  Goal status: INITIAL   PLAN: PT FREQUENCY: 1-2x/week   PT DURATION:  6 months   PLANNED INTERVENTIONS: Therapeutic exercises, Therapeutic activity, Neuromuscular re-education, Patient/Family education, Self Care, Joint mobilization, Aquatic Therapy, Dry Needling, Spinal mobilization, Cryotherapy, Moist heat, scar mobilization, Taping, Vasopneumatic device, Biofeedback, and Manual therapy   PLAN FOR  NEXT SESSION: Perform internal pelvic floor assessment and begin treatment accordingly  Julio Alm, PT, DPT05/08/245:14 PM

## 2022-09-03 ENCOUNTER — Ambulatory Visit: Payer: Medicaid Other

## 2022-09-03 DIAGNOSIS — R293 Abnormal posture: Secondary | ICD-10-CM

## 2022-09-03 DIAGNOSIS — M5459 Other low back pain: Secondary | ICD-10-CM

## 2022-09-03 DIAGNOSIS — M6281 Muscle weakness (generalized): Secondary | ICD-10-CM

## 2022-09-03 DIAGNOSIS — R102 Pelvic and perineal pain: Secondary | ICD-10-CM | POA: Diagnosis not present

## 2022-09-03 DIAGNOSIS — M62838 Other muscle spasm: Secondary | ICD-10-CM

## 2022-09-03 DIAGNOSIS — R279 Unspecified lack of coordination: Secondary | ICD-10-CM

## 2022-09-03 NOTE — Therapy (Signed)
OUTPATIENT PHYSICAL THERAPY FEMALE PELVIC TREATMENT   Patient Name: Melissa Pruitt MRN: [id number] DOB:August 05, 1975, 47 y.o., female Today's Date: 09/03/2022  END OF SESSION:  PT End of Session - 09/03/22 1527     Visit Number 2    Date for PT Re-Evaluation 02/04/23    Authorization Type [employer]    Authorization Time Period 08/20/2022-10/18/2022    Authorization - Visit Number 1    Authorization - Number of Visits 6              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   R mid sole superior moderate atypia    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   R med sole inferior moderate atypia    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   R dorsum lat great toe   Dysplastic nevus 06/11/2020   left prox lat thigh sup, - severe   Dysplastic nevus 06/11/2020   left prox lat thigh inf, - moderate   Dysplastic nevus 06/11/2020   R mid to upper back 2.0cm lat to spine, Severe atypia   Dysplastic nevus 06/11/2020   R distal post thigh, moderate atypia   Dysplastic nevus 08/23/2020   Right labia - mild   Dysplastic nevus 08/12/2021   R lat neck post, moderate atypia   Dysplastic nevus 06/11/2012   L dorsal foot, mild atypia, bx from Dr. 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   Melanoma (HCC) 07/02/2020   Pain in joint of left shoulder 11/08/2019   Cervical radiculopathy 05/24/2019   Trigeminal neuralgia 11/05/2018   Symptomatic states associated with artificial menopause 11/05/2018   Arthralgia of hand, right 11/17/2017   Vitamin D deficiency 02/18/2017   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   Other specified disorders of uterus 10/12/2015   Herpes simplex 06/27/2015   Hyperlipidemia 05/30/2015   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   Normocytic anemia 08/21/2014   Vitamin B 12 deficiency 08/21/2014   Allergic rhinitis 01/19/2014   Condyloma acuminatum 01/09/2014   Chronic pain 08/09/2013   Cervical dysplasia 05/09/2013   Other headache syndrome 05/09/2013   Arthralgia of temporomandibular joint 05/09/2013   Cervical pain 08/25/2012   Pain in joint, pelvic region and thigh 07/16/2012   Tethered spinal cord (HCC) 07/16/2012   Balance disorder 07/16/2012   Personal history of fall 07/16/2012   Neurogenic bladder 06/25/2011  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   OTHER SPECIFIED CONGENITAL ANOMALY SPINAL CORD 05/08/2010   UNSPEC CONGN ANOMALY BRAIN SP CORD&NERV SYSTEM 05/08/2010   H N P-LUMBAR 04/23/2010   BACK PAIN 01/28/2010   Papanicolaou smear of cervix with atypical squamous cells cannot exclude high grade squamous intraepithelial lesion (ASC-H) 04/14/1898    PCP: Orson Eva, NP  REFERRING PROVIDER: Lonell Face, MD  REFERRING DIAG: R10.2 Pelvic pain  THERAPY DIAG:  Pelvic pain  Abnormal posture  Muscle weakness (generalized)  Unspecified lack of coordination  Other muscle spasm  Other low back pain  Rationale for Evaluation and Treatment: Rehabilitation  ONSET DATE:  chronic  SUBJECTIVE:                                                                                                                                                                                            SUBJECTIVE STATEMENT: Pt states that she currently has UTI and E.Coli infection that she cannot get cleared up. She went to [place] and feels like she overdid.      PAIN:  Are you having pain? Yes NPRS scale: 9/10 currently; 10+/10 at worst Pain location: Rt ischial tuberosity with pain down lateral thigh to knee, Rt groin/anterior hip/abdomen, Rt low back, Rt side of tailbone   Pain type: aching, burning, dull, and stabbing  Pain description: constant (but level varies)   Aggravating factors: standing, sitting more than 30 mins Relieving factors: rest, muscle relaxer, taping, pelvic stability belt; popping, pillows for sleeping   PRECAUTIONS: Fall   WEIGHT BEARING RESTRICTIONS No   FALLS:  Has patient fallen in last 6 months? Yes. Number of falls 2   LIVING ENVIRONMENT: Lives with: lives with their family Lives in: House/apartment     OCCUPATION: disabled   PLOF: Independent   PATIENT GOALS to have less pain   PERTINENT HISTORY:  Myelomeningocele with surgical repair,  self-catheterizes for neurogenic bladder, Cervical cancer, HPV , recurrent UTIs, HSV , chronic rt hip pain, melanoma sle of Rt foot has been excised but has foot sensory changes, cervical radiculopathy, trigeminal neuralgia, grade 1 carpal tunnel, left mild chronic C7 radiculopathy   Sexual abuse: No   BOWEL MOVEMENT Pain with bowel movement: No Type of bowel movement:Type (Bristol Stool Scale) type 1-2 usually, will have diarrhea intermittently but thinks a lot of this is more about constipation and low motility, Frequency empty bowels every time caths, and usually this is enough unless has diarrhea, and Strain No Fully empty rectum: No does not have urge regularly, does have increased pressure in low  back.   Leakage: Yes: with diarrhea  wears AAT Pads: Yes: pull ups AAT Fiber supplement: No   URINATION Pain with urination: No Fully empty bladder: No does self cath to empty Stream: self-cath Urgency: No has no urge to have urine void Frequency: self caths 6x per day Leakage:  none Pads: No   INTERCOURSE Pain with intercourse:  not painful internally but will have pain at Rt pelvis or hip Ability to have vaginal penetration:  Yes:   Climax: yes not painful Marinoff Scale: 0/3   PREGNANCY Vaginal deliveries 3 Tearing Yes: tearing with first with stiches, tearing with second C-section deliveries 0 Currently pregnant No   PROLAPSE None       OBJECTIVE:   09/03/22 External Perineal Exam: no sensation Rt labia               Internal Pelvic Floor :  no sensation/decreased sensation superficial/deep pelvic floor - more sensation with palpation closer to coccyx vaginally, no sensation palpated on or lateral to coccyx rectally; significant hypermobility of coccyx; 100% pain reduction in low back/coccyx with posterior pressure on Rt coccyx; increase in low back pain with posterior pressure on Lt coccyx; spasm in Rt OI   Patient confirms identification and approves PT to assess internal pelvic floor and treatment Yes-next session    PELVIC MMT:   MMT eval  Vaginal 1/5 with significant effort   Internal Anal Sphincter  0/5  External Anal Sphincter 0/5 Rt, 1/5 Lt   Puborectalis  1/5  Diastasis Recti    (Blank rows = not tested)         TONE: Low, decreased more on Rt than Lt   PROLAPSE: Grade 2 anterior vaginal wall laxity  08/20/22 PATIENT SURVEYS:  [id number]     COGNITION:            Overall cognitive status: Within functional limits for tasks assessed                          SENSATION:            Light touch: Deficits Rt flank down to toes has decreased sensation, decreased sensation at external genitalis             Proprioception: Deficits Rt side decreased     MUSCLE LENGTH: Rt hamstring and adductor limited by 50%, Lt 25%  GAIT: Comments: decreased mobility at Rt LE, foot drop Rt foot, decreased flexion of Rt knee, hip circumduction noted with gait at Rt, decreased cadence   POSTURE: rounded shoulders, forward head, and posterior pelvic tilt; Lt posterior/Rt anterior rotation; Slight Lt iliac crest elevation  FUNCTIONAL TASK: Lunge: bil instability present, pain with Rt   LUMBARAROM/PROM   A/PROM A/PROM  Eval (% available)  Flexion WFL  Extension 50 with pain Rt  Right lateral flexion WNL  Left lateral flexion 50 limited by Rt side  Right rotation WNL  Left rotation 50 limtied by Rt side   (Blank rows = not tested)   LOWER EXTREMITY ROM:   WFL but tension felt pt pain at Rt side   LOWER EXTREMITY MMT:   MMT Right Eval All out of 5 Left Eval All out of 5  Hip flexion 2 4  Hip extension 3 4  Hip abduction 2+ 4  Hip adduction 3 4  Hip internal rotation  4 4  Hip external rotation  4 4  Knee flexion 3+ 4+  Knee extension 2+ 4+  Ankle dorsiflexion 0 4  Ankle plantarflexion 1+ 4  Ankle inversion 1+ 4  Ankle eversion 1+ 4    PALPATION: General: Pain throughout Rt lower quadrant, flank, low back; significant scar tissue restriction across low back/sacrum       TODAY'S TREATMENT  09/03/22 Manual: Pt provides verbal consent for internal vaginal/rectal pelvic floor exam. Internal vaginal/rectal pelvic floor muscle assessment Deep pelvic floor muscle release   08/20/22 EVAL Examination completed, findings reviewed, pt educated on POC.  Pt motivated to participate in PT and agreeable to attempt recommendations.    Check all possible CPT codes: 40981 - Therapeutic Activities    Check all conditions that are expected to impact treatment: {Conditions expected to impact treatment:Neurological condition and/or seizures   If treatment provided at initial evaluation, no treatment charged due to lack of authorization.             PATIENT EDUCATION:  Education details: See above Person educated: Patient Education method: Explanation, Demonstration, Tactile cues, Verbal cues, and Handouts Education comprehension: verbalized understanding and returned demonstration     HOME EXERCISE PROGRAM:    ASSESSMENT:   CLINICAL IMPRESSION: Internal vaginal and rectal pelvic floor muscle assessment performed this session. Decreased/absent sensation in much of Rt vaginal/rectal area. Significant weakness in bil pelvic floor, but she does have active levator ani contraction bil; she does not have Rt external anal sphincter contraction and no internal anal sphincter contraction palpated today. She has complete relief of low back/coccyx pain with posterior translation of coccyx. She tolerated some pelvic floor release along coccyx borders and Rt OI; we will proceed cautiously due to history of flaring up with manual techniques and lack of sensation to determine tolerance. She will continue to benefit from skilled PT intervention in order to decrease pain, begin/progress functional strengthening program, improve bowel movements, and improve quality of life.      OBJECTIVE IMPAIRMENTS decreased activity tolerance, decreased coordination, decreased endurance, decreased mobility, difficulty walking, decreased strength, increased fascial restrictions, increased muscle spasms, impaired flexibility, improper body mechanics, postural dysfunction, and pain.    ACTIVITY LIMITATIONS carrying, lifting, sitting, standing, squatting, stairs, continence, toileting, and locomotion level   PARTICIPATION LIMITATIONS: meal prep, cleaning, interpersonal relationship, driving, shopping, community activity, and yard work   PERSONAL FACTORS Past/current experiences, Time since onset of injury/illness/exacerbation, and 3+ comorbidities: medical history  are also affecting patient's functional outcome.    REHAB POTENTIAL: Good   CLINICAL  DECISION MAKING: Evolving/moderate complexity   EVALUATION COMPLEXITY: Moderate     GOALS: Goals reviewed with patient? Yes   SHORT TERM GOALS: Target date: 09/24/22   Pt to be I with HEP.  Baseline: Goal status: INITIAL   2.  Pt will report 25% reduction of pain due to improvements in posture, strength, and muscle length  Baseline: 10/10 Goal status: INITIAL   3.  Pt will report her BMs and bladder voids are complete due to improved bowel habits and evacuation techniques.  Baseline:  Goal status: INITIAL     LONG TERM GOALS: Target date: 02/04/23     Pt to be I with advanced HEP.  Baseline:  Goal status: INITIAL   2.  Pt will report 50% reduction of pain due to improvements in posture, strength, and muscle length  Baseline:  Goal status: INITIAL   3.  Pt to be I with pelvic wand/dilator use for pain management outside of PT.  Baseline:  Goal status: INITIAL   4.  Pt to be I with abdominal massage, voiding mechanics, breathing mechanics  for improved pelvic relaxation and decreased pain.  Baseline:  Goal status: INITIAL   5.  Pt to demonstrated improved coordination of pelvic floor and breathing mechanics with functional squat with no increase in pain. Baseline:  Goal status: INITIAL   6.  Pt to demonstrate at least 3/5 pelvic floor strength and ability to fully relax post contraction for improved pelvic stability and decreased strain at pelvic floor. Baseline:  Goal status: INITIAL   PLAN: PT FREQUENCY: 1-2x/week   PT DURATION:  6 months   PLANNED INTERVENTIONS: Therapeutic exercises, Therapeutic activity, Neuromuscular re-education, Patient/Family education, Self Care, Joint mobilization, Aquatic Therapy, Dry Needling, Spinal mobilization, Cryotherapy, Moist heat, scar mobilization, Taping, Vasopneumatic device, Biofeedback, and Manual therapy   PLAN FOR NEXT SESSION: Stretches/mobility exercises; external manual techniques.   Julio Alm, PT,  DPT05/22/245:07 PM

## 2022-09-04 ENCOUNTER — Ambulatory Visit: Payer: Medicaid Other

## 2022-09-04 DIAGNOSIS — R293 Abnormal posture: Secondary | ICD-10-CM

## 2022-09-04 DIAGNOSIS — R102 Pelvic and perineal pain: Secondary | ICD-10-CM

## 2022-09-04 DIAGNOSIS — M5459 Other low back pain: Secondary | ICD-10-CM

## 2022-09-04 DIAGNOSIS — R279 Unspecified lack of coordination: Secondary | ICD-10-CM

## 2022-09-04 DIAGNOSIS — M62838 Other muscle spasm: Secondary | ICD-10-CM

## 2022-09-04 DIAGNOSIS — M6281 Muscle weakness (generalized): Secondary | ICD-10-CM

## 2022-09-04 NOTE — Therapy (Signed)
OUTPATIENT PHYSICAL THERAPY FEMALE PELVIC TREATMENT   Patient Name: Melissa Pruitt MRN: [id number] DOB:1976/02/27, 47 y.o., female Today's Date: 09/04/2022  END OF SESSION:  PT End of Session - 09/04/22 1522     Visit Number 3    Date for PT Re-Evaluation 02/04/23    Authorization Type [employer]    Authorization Time Period 08/20/2022-10/18/2022    Authorization - Visit Number 2    Authorization - Number of Visits 6    PT Start Time 1525    PT Stop Time 1610    PT Time Calculation (min) 45 min    Activity Tolerance Patient tolerated treatment well    Behavior During Therapy [facility] for tasks assessed/performed               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   R mid sole superior moderate atypia    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   R med sole inferior moderate atypia    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   R dorsum lat great toe   Dysplastic nevus 06/11/2020   left prox lat thigh sup, - severe   Dysplastic nevus 06/11/2020   left prox lat thigh inf, - moderate   Dysplastic nevus 06/11/2020   R mid to upper back 2.0cm lat to spine, Severe atypia   Dysplastic nevus 06/11/2020   R distal post thigh, moderate atypia   Dysplastic nevus 08/23/2020   Right labia - mild   Dysplastic nevus 08/12/2021   R lat neck post, moderate atypia   Dysplastic nevus 06/11/2012   L dorsal foot, mild atypia, bx from Dr. Larey Dresser   History of recurrent UTIs    HPV (human papilloma virus) infection    HSV (herpes simplex virus) infection    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[facility])    sacro/coccygeal mass excision   Melanoma (HCC) 02/16/2020   R mid plantar sole, BRESLOW'S DEPTH/MAXIMUM TUMOR THICKNESS: 0.5 MM, CLARK/ANATOMIC LEVEL: II Excised 03/20/2020   Normocytic anemia 08/21/2014   Tethered spinal cord (HCC)    Vitamin B 12 deficiency 08/21/2014   Past Surgical  History:  Procedure Laterality Date   BACK SURGERY     BLADDER SURGERY     COLOSTOMY     after flex sig as 47 year old.    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   Other specified disorders of uterus 10/12/2015   Herpes simplex 06/27/2015   Hyperlipidemia 05/30/2015   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   Normocytic anemia 08/21/2014   Vitamin B 12 deficiency 08/21/2014   Allergic rhinitis 01/19/2014   Condyloma acuminatum 01/09/2014   Chronic pain 08/09/2013   Cervical dysplasia 05/09/2013   Other headache syndrome 05/09/2013   Arthralgia of temporomandibular  joint 05/09/2013   Cervical pain 08/25/2012   Pain in joint, pelvic region and thigh 07/16/2012   Tethered spinal cord (HCC) 07/16/2012   Balance disorder 07/16/2012   Personal history of fall 07/16/2012   Neurogenic bladder 06/25/2011   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   OTHER SPECIFIED CONGENITAL ANOMALY SPINAL CORD 05/08/2010   UNSPEC CONGN ANOMALY BRAIN SP CORD&NERV SYSTEM 05/08/2010   H N P-LUMBAR 04/23/2010   BACK PAIN 01/28/2010   Papanicolaou smear of cervix with atypical squamous cells cannot exclude high grade squamous intraepithelial lesion (ASC-H) 04/14/1898    PCP: Orson Eva, NP  REFERRING PROVIDER: Lonell Face, MD  REFERRING DIAG: R10.2 Pelvic pain  THERAPY DIAG:  Pelvic pain  Abnormal  posture  Muscle weakness (generalized)  Unspecified lack of coordination  Other muscle spasm  Other low back pain  Rationale for Evaluation and Treatment: Rehabilitation  ONSET DATE: chronic  SUBJECTIVE:                                                                                                                                                                                            SUBJECTIVE STATEMENT: Pt states that she is sore from manual work yesterday, and the pain is located in Rt groin and along Rt coccyx border.   PAIN:  Are you having pain? Yes NPRS scale: 8-9/10 currently; 10+/10 at worst Pain location: Rt ischial tuberosity with pain down lateral thigh to knee, Rt groin/anterior hip/abdomen, Rt low back, Rt side of tailbone   Pain type: aching, burning, dull, and stabbing  Pain description: constant (but level varies)   Aggravating factors: standing, sitting more than 30 mins Relieving factors: rest, muscle relaxer, taping, pelvic stability belt; popping, pillows for sleeping   PRECAUTIONS: Fall   WEIGHT BEARING RESTRICTIONS No   FALLS:  Has patient fallen in last 6 months? Yes. Number of falls 2   LIVING ENVIRONMENT: Lives with: lives with their family Lives in: House/apartment     OCCUPATION: disabled   PLOF: Independent   PATIENT GOALS to have less pain   PERTINENT HISTORY:  Myelomeningocele with surgical repair,  self-catheterizes for neurogenic bladder, Cervical cancer, HPV , recurrent UTIs, HSV , chronic rt hip pain, melanoma sle of Rt foot has been excised but has foot sensory changes, cervical radiculopathy, trigeminal neuralgia, grade 1 carpal tunnel, left mild chronic C7 radiculopathy   Sexual abuse: No   BOWEL MOVEMENT Pain with bowel movement: No Type of bowel movement:Type (Bristol Stool Scale) type 1-2 usually, will have diarrhea intermittently but thinks a lot of this is more about constipation and low motility, Frequency empty  bowels every time caths, and usually this is enough unless has diarrhea, and Strain No Fully empty rectum: No does not have urge regularly, does have increased pressure in low back.   Leakage: Yes: with diarrhea wears AAT Pads: Yes: pull ups AAT Fiber supplement: No   URINATION Pain with urination: No Fully empty bladder: No does self cath to empty Stream: self-cath Urgency: No has no urge to have urine void Frequency: self caths 6x per day Leakage:  none Pads: No   INTERCOURSE Pain with intercourse:  not painful internally but will have pain at Rt pelvis or hip Ability to have vaginal penetration:  Yes:   Climax: yes not painful Marinoff Scale: 0/3   PREGNANCY Vaginal deliveries 3 Tearing Yes: tearing with first with stiches, tearing with second C-section deliveries 0 Currently pregnant No   PROLAPSE None       OBJECTIVE:   09/03/22 External Perineal Exam: no sensation Rt labia               Internal Pelvic Floor :  no sensation/decreased sensation superficial/deep pelvic floor - more sensation with palpation closer to coccyx vaginally, no sensation palpated on or lateral to coccyx rectally; significant hypermobility of coccyx; 100% pain reduction in low back/coccyx with posterior pressure on Rt coccyx; increase in low back pain with posterior pressure on Lt coccyx; spasm in Rt OI   Patient confirms identification and approves PT to assess internal pelvic floor and treatment Yes-next session    PELVIC MMT:   MMT eval  Vaginal 1/5 with significant effort   Internal Anal Sphincter  0/5  External Anal Sphincter 0/5 Rt, 1/5 Lt   Puborectalis  1/5  Diastasis Recti    (Blank rows = not tested)         TONE: Low, decreased more on Rt than Lt   PROLAPSE: Grade 2 anterior vaginal wall laxity  08/20/22 PATIENT SURVEYS:  [id number]     COGNITION:            Overall cognitive status: Within functional limits for tasks assessed                          SENSATION:             Light touch: Deficits Rt flank down to toes has decreased sensation, decreased sensation at external genitalis             Proprioception: Deficits Rt side decreased    MUSCLE LENGTH: Rt hamstring and adductor limited by 50%, Lt 25%  GAIT: Comments: decreased mobility at Rt LE, foot drop Rt foot, decreased flexion of Rt knee, hip circumduction noted with gait at Rt, decreased cadence   POSTURE: rounded shoulders, forward head, and posterior pelvic tilt; Lt posterior/Rt anterior rotation; Slight Lt iliac crest elevation  FUNCTIONAL TASK: Lunge: bil instability present, pain with Rt   LUMBARAROM/PROM   A/PROM A/PROM  Eval (% available)  Flexion WFL  Extension 50 with pain Rt  Right lateral flexion WNL  Left lateral flexion 50 limited by Rt side  Right rotation WNL  Left rotation 50 limtied by Rt side   (Blank rows = not tested)   LOWER EXTREMITY ROM:   WFL but tension felt pt pain at Rt side   LOWER EXTREMITY MMT:   MMT Right Eval All out of 5 Left Eval All out of 5  Hip flexion 2 4  Hip extension 3 4  Hip abduction  2+ 4  Hip adduction 3 4  Hip internal rotation  4 4  Hip external rotation  4 4  Knee flexion 3+ 4+  Knee extension 2+ 4+  Ankle dorsiflexion 0 4  Ankle plantarflexion 1+ 4  Ankle inversion 1+ 4  Ankle eversion 1+ 4    PALPATION: General: Pain throughout Rt lower quadrant, flank, low back; significant scar tissue restriction across low back/sacrum       TODAY'S TREATMENT  09/04/22 Manual: Trigger Point Dry-Needling  Treatment instructions: Expect mild to moderate muscle soreness. S/S of pneumothorax if dry needled over a lung field, and to seek immediate medical attention should they occur. Patient verbalized understanding of these instructions and education.  Patient Consent Given: Yes Education handout provided: Yes Muscles treated: Rt obliques Electrical stimulation performed: No Parameters: N/A Treatment response/outcome: twitch  response/release Soft tissue mobilization Rt abdomen/obliques Instrument assisted soft tissue mobilization Rt ischial tuberosity, hamstring/glutes  Neuromuscular re-education: Transversus abdominus training with multimodal cues for improved motor control and breath coordination 2 x 5 isometrics core contractions  Supine bil UE ball press 10x Supine hip adduction ball press 10x Side lying UE ball press    09/03/22 Manual: Pt provides verbal consent for internal vaginal/rectal pelvic floor exam. Internal vaginal/rectal pelvic floor muscle assessment Deep pelvic floor muscle release   08/20/22 EVAL Examination completed, findings reviewed, pt educated on POC.  Pt motivated to participate in PT and agreeable to attempt recommendations.    Check all possible CPT codes: 16109 - Therapeutic Activities    Check all conditions that are expected to impact treatment: {Conditions expected to impact treatment:Neurological condition and/or seizures   If treatment provided at initial evaluation, no treatment charged due to lack of authorization.            PATIENT EDUCATION:  Education details: See above Person educated: Patient Education method: Explanation, Demonstration, Tactile cues, Verbal cues, and Handouts Education comprehension: verbalized understanding and returned demonstration     HOME EXERCISE PROGRAM: [id number]   ASSESSMENT:   CLINICAL IMPRESSION: Pt tolerated initial manual therapy to pelvic floor yesterday very well with soreness, but not in a debilitating way. She was able to start core training and progressions with good activation of transversus abdominus after initial attempts. Contraction was stronger on Lt than Rt, but made some improvements through session. Oblique dominance on Rt and trigger points needled with significant twitch response. Instrument assisted soft tissue mobilization performed to right posterior hip/pelvic with good tolerance. HEP updated with core  activation exercises. She will continue to benefit from skilled PT intervention in order to decrease pain, begin/progress functional strengthening program, improve bowel movements, and improve quality of life.      OBJECTIVE IMPAIRMENTS decreased activity tolerance, decreased coordination, decreased endurance, decreased mobility, difficulty walking, decreased strength, increased fascial restrictions, increased muscle spasms, impaired flexibility, improper body mechanics, postural dysfunction, and pain.    ACTIVITY LIMITATIONS carrying, lifting, sitting, standing, squatting, stairs, continence, toileting, and locomotion level   PARTICIPATION LIMITATIONS: meal prep, cleaning, interpersonal relationship, driving, shopping, community activity, and yard work   PERSONAL FACTORS Past/current experiences, Time since onset of injury/illness/exacerbation, and 3+ comorbidities: medical history  are also affecting patient's functional outcome.    REHAB POTENTIAL: Good   CLINICAL DECISION MAKING: Evolving/moderate complexity   EVALUATION COMPLEXITY: Moderate     GOALS: Goals reviewed with patient? Yes   SHORT TERM GOALS: Target date: 09/24/22   Pt to be I with HEP.  Baseline: Goal status: INITIAL   2.  Pt will report 25% reduction of pain due to improvements in posture, strength, and muscle length  Baseline: 10/10 Goal status: INITIAL   3.  Pt will report her BMs and bladder voids are complete due to improved bowel habits and evacuation techniques.  Baseline:  Goal status: INITIAL     LONG TERM GOALS: Target date: 02/04/23     Pt to be I with advanced HEP.  Baseline:  Goal status: INITIAL   2.  Pt will report 50% reduction of pain due to improvements in posture, strength, and muscle length  Baseline:  Goal status: INITIAL   3.  Pt to be I with pelvic wand/dilator use for pain management outside of PT.  Baseline:  Goal status: INITIAL   4.  Pt to be I with abdominal massage,  voiding mechanics, breathing mechanics for improved pelvic relaxation and decreased pain.  Baseline:  Goal status: INITIAL   5.  Pt to demonstrated improved coordination of pelvic floor and breathing mechanics with functional squat with no increase in pain. Baseline:  Goal status: INITIAL   6.  Pt to demonstrate at least 3/5 pelvic floor strength and ability to fully relax post contraction for improved pelvic stability and decreased strain at pelvic floor. Baseline:  Goal status: INITIAL   PLAN: PT FREQUENCY: 1-2x/week   PT DURATION:  6 months   PLANNED INTERVENTIONS: Therapeutic exercises, Therapeutic activity, Neuromuscular re-education, Patient/Family education, Self Care, Joint mobilization, Aquatic Therapy, Dry Needling, Spinal mobilization, Cryotherapy, Moist heat, scar mobilization, Taping, Vasopneumatic device, Biofeedback, and Manual therapy   PLAN FOR NEXT SESSION: Progress core strengthening; manual techniques as needed.   Julio Alm, PT, DPT05/23/244:16 PM

## 2022-09-05 ENCOUNTER — Encounter: Payer: Self-pay | Admitting: Obstetrics and Gynecology

## 2022-09-05 ENCOUNTER — Ambulatory Visit: Payer: Medicaid Other | Admitting: Obstetrics and Gynecology

## 2022-09-05 ENCOUNTER — Other Ambulatory Visit (HOSPITAL_COMMUNITY)
Admission: RE | Admit: 2022-09-05 | Discharge: 2022-09-05 | Disposition: A | Discharge status: Home / Self Care | Payer: Medicaid Other | Source: Ambulatory Visit | Attending: Obstetrics and Gynecology | Admitting: Obstetrics and Gynecology

## 2022-09-05 ENCOUNTER — Other Ambulatory Visit: Payer: Self-pay | Admitting: Obstetrics and Gynecology

## 2022-09-05 VITALS — BP 124/82 | HR 84

## 2022-09-05 DIAGNOSIS — R350 Frequency of micturition: Secondary | ICD-10-CM | POA: Diagnosis not present

## 2022-09-05 DIAGNOSIS — N390 Urinary tract infection, site not specified: Secondary | ICD-10-CM

## 2022-09-05 DIAGNOSIS — B9689 Other specified bacterial agents as the cause of diseases classified elsewhere: Secondary | ICD-10-CM | POA: Diagnosis not present

## 2022-09-05 LAB — POCT URINALYSIS DIPSTICK
Appearance: NEGATIVE
Bilirubin, UA: NEGATIVE
Blood, UA: NEGATIVE
Glucose, UA: NEGATIVE
Ketones, UA: NEGATIVE
Leukocytes, UA: NEGATIVE
Nitrite, UA: 0.2
Protein, UA: NEGATIVE
Spec Grav, UA: 1.02 (ref 1.010–1.025)
Urobilinogen, UA: NEGATIVE E.U./dL — AB
pH, UA: 7 (ref 5.0–8.0)

## 2022-09-05 MED ORDER — FOSFOMYCIN TROMETHAMINE 3 G PO PACK
3.0000 g | PACK | ORAL | 5 refills | Status: DC
Start: 2022-09-05 — End: 2022-09-05

## 2022-09-05 NOTE — Patient Instructions (Signed)
Start Fosfamyacin today and then every 10 days daily

## 2022-09-05 NOTE — Progress Notes (Signed)
[facility] Return Visit  SUBJECTIVE  History of Present Illness: Melissa Pruitt is a 47 y.o. female seen in follow-up for rUTI. She has recently been seen in the ER x3.   She has neurogenic bladder and has been self catheterizing since 2012.   She reports she had a sexual partner cause infection and had been dealing with problems since earlier this year. She presented to the ER on 07/25/22, 08/10/22. And 08/14/22 in an attempt to treat her symptoms in the bladder. She reports she was having burning and pain that "felt like fire" every time she catheterized.  Past Medical History: Patient  has a past medical history of Abnormal Pap smear of cervix, Cervical cancer (HCC), Dysplastic nevus (02/16/2020), Dysplastic nevus (02/16/2020), Dysplastic nevus (02/16/2020), Dysplastic nevus (06/11/2020), Dysplastic nevus (06/11/2020), Dysplastic nevus (06/11/2020), Dysplastic nevus (06/11/2020), Dysplastic nevus (08/23/2020), Dysplastic nevus (08/12/2021), Dysplastic nevus (06/11/2012), History of recurrent UTIs, HPV (human papilloma virus) infection, HSV (herpes simplex virus) infection, 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[facility]), Melanoma (HCC) (02/16/2020), Normocytic anemia (08/21/2014), Tethered spinal cord (HCC), and Vitamin B 12 deficiency (08/21/2014).   Past Surgical History: She  has a past surgical history that includes Back surgery; Bladder surgery; Colostomy; Tubal ligation; removal of cervical cancer; colostomy reversal; Exploratory laparotomy; and LEEP.   Medications: She has a current medication list which includes the following prescription(s): conjugated estrogens, vitamin b-12, epinephrine, estradiol, fluticasone, fosfomycin, hydrocodone-acetaminophen, hydrocortisone, comfort protect adult diaper/m, loratadine, methocarbamol, norethindrone, ondansetron, and oxycodone.   Allergies: Patient is allergic to ciprofloxacin, macrobid [nitrofurantoin monohyd macro], morphine and  codeine, sulfa antibiotics, azithromycin, dilaudid [hydromorphone hcl], lactose, prednisolone, prednisone, rosuvastatin, tramadol hcl, vitamin d analogs, lidocaine, and pork-derived products.   Social History: Patient  reports that she quit smoking about 11 years ago. Her smoking use included cigarettes. She has never used smokeless tobacco. She reports current alcohol use. She reports that she does not use drugs.      OBJECTIVE    Culture Results:  08/10/22: Positive for Citrobacter Freudi  08/05/22: Positive for E. Coli 07/21/22: Positive for E-Coli  Physical Exam: Vitals:   09/05/22 0951  BP: 124/82  Pulse: 84   Gen: No apparent distress, A&O x 3.  Detailed Urogynecologic Evaluation:  Deferred.     ASSESSMENT AND PLAN    [name] is a 47 y.o. with:  1. Recurrent UTI   2. Urinary frequency   3. Recurrent urinary tract infection    Patient did well with 6 month course of Fosfomyacin 3g every 10 days. We will do another course of this with the goal to be to discontinue at 6 months and start methenamine potentially.  She is doing pelvic floor PT with Julio Alm to help control the pelvic floor symptoms.   Patient to follow up in 3 months or sooner if needed. Encouraged to call if she feels she needs to give a urine sample to send for culture.

## 2022-09-06 LAB — URINE CULTURE

## 2022-09-07 LAB — URINE CULTURE: Culture: 400 — AB

## 2022-09-08 LAB — URINE CULTURE

## 2022-09-09 ENCOUNTER — Ambulatory Visit: Payer: Medicaid Other

## 2022-09-09 DIAGNOSIS — M5459 Other low back pain: Secondary | ICD-10-CM

## 2022-09-09 DIAGNOSIS — R102 Pelvic and perineal pain: Secondary | ICD-10-CM | POA: Diagnosis not present

## 2022-09-09 DIAGNOSIS — R279 Unspecified lack of coordination: Secondary | ICD-10-CM

## 2022-09-09 DIAGNOSIS — M6281 Muscle weakness (generalized): Secondary | ICD-10-CM

## 2022-09-09 DIAGNOSIS — R293 Abnormal posture: Secondary | ICD-10-CM

## 2022-09-09 DIAGNOSIS — M62838 Other muscle spasm: Secondary | ICD-10-CM

## 2022-09-09 NOTE — Therapy (Signed)
OUTPATIENT PHYSICAL THERAPY FEMALE PELVIC TREATMENT   Patient Name: Melissa Pruitt MRN: [id number] DOB:06-29-1975, 47 y.o., female Today's Date: 09/09/2022  END OF SESSION:  PT End of Session - 09/09/22 0932     Visit Number 4    Date for PT Re-Evaluation 02/04/23    Authorization Type [employer]    Authorization Time Period 08/20/2022-10/18/2022    Authorization - Visit Number 3    Authorization - Number of Visits 6    PT Start Time 0930    PT Stop Time 1010    PT Time Calculation (min) 40 min    Activity Tolerance Patient tolerated treatment well    Behavior During Therapy [facility] for tasks assessed/performed               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   R mid sole superior moderate atypia    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   R med sole inferior moderate atypia    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   R dorsum lat great toe   Dysplastic nevus 06/11/2020   left prox lat thigh sup, - severe   Dysplastic nevus 06/11/2020   left prox lat thigh inf, - moderate   Dysplastic nevus 06/11/2020   R mid to upper back 2.0cm lat to spine, Severe atypia   Dysplastic nevus 06/11/2020   R distal post thigh, moderate atypia   Dysplastic nevus 08/23/2020   Right labia - mild   Dysplastic nevus 08/12/2021   R lat neck post, moderate atypia   Dysplastic nevus 06/11/2012   L dorsal foot, mild atypia, bx from Dr. 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   Other specified disorders of uterus 10/12/2015   Herpes simplex 06/27/2015   Hyperlipidemia 05/30/2015   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   Normocytic anemia 08/21/2014   Vitamin B 12 deficiency 08/21/2014   Allergic rhinitis 01/19/2014   Condyloma acuminatum 01/09/2014   Chronic pain 08/09/2013   Cervical dysplasia 05/09/2013   Other headache syndrome 05/09/2013   Arthralgia of temporomandibular  joint 05/09/2013   Cervical pain 08/25/2012   Pain in joint, pelvic region and thigh 07/16/2012   Tethered spinal cord (HCC) 07/16/2012   Balance disorder 07/16/2012   Personal history of fall 07/16/2012   Neurogenic bladder 06/25/2011   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   OTHER SPECIFIED CONGENITAL ANOMALY SPINAL CORD 05/08/2010   UNSPEC CONGN ANOMALY BRAIN SP CORD&NERV SYSTEM 05/08/2010   H N P-LUMBAR 04/23/2010   BACK PAIN 01/28/2010   Papanicolaou smear of cervix with atypical squamous cells cannot exclude high grade squamous intraepithelial lesion (ASC-H) 04/14/1898    PCP: Orson Eva, NP  REFERRING PROVIDER: Lonell Face, MD  REFERRING DIAG: R10.2 Pelvic pain  THERAPY DIAG:  Pelvic pain  Abnormal  posture  Muscle weakness (generalized)  Unspecified lack of coordination  Other muscle spasm  Other low back pain  Rationale for Evaluation and Treatment: Rehabilitation  ONSET DATE: chronic  SUBJECTIVE:                                                                                                                                                                                            SUBJECTIVE STATEMENT: Pt states that she was very sore after last treatment session, but she did not take muscle relaxer and only used heating pad. She also had some referral into Rt foot. She is still having some soreness. She has been working on exercises and feel like they are easy.    PAIN:  Are you having pain? Yes NPRS scale: 9/10 currently Pain location: Rt ischial tuberosity with pain down lateral thigh to knee, Rt groin/anterior hip/abdomen, Rt low back, Rt side of tailbone   Pain type: aching, burning, dull, and stabbing  Pain description: constant (but level varies)   Aggravating factors: standing, sitting more than 30 mins Relieving factors: rest, muscle relaxer, taping, pelvic stability belt; popping, pillows for sleeping   PRECAUTIONS: Fall   WEIGHT BEARING RESTRICTIONS No   FALLS:  Has patient fallen in last 6 months? Yes. Number of falls 2   LIVING ENVIRONMENT: Lives with: lives with their family Lives in: House/apartment     OCCUPATION: disabled   PLOF: Independent   PATIENT GOALS to have less pain   PERTINENT HISTORY:  Myelomeningocele with surgical repair,  self-catheterizes for neurogenic bladder, Cervical cancer, HPV , recurrent UTIs, HSV , chronic rt hip pain, melanoma sle of Rt foot has been excised but has foot sensory changes, cervical radiculopathy, trigeminal neuralgia, grade 1 carpal tunnel, left mild chronic C7 radiculopathy   Sexual abuse: No   BOWEL MOVEMENT Pain with bowel movement: No Type of bowel movement:Type [employer]  Stool Scale) type 1-2  usually, will have diarrhea intermittently but thinks a lot of this is more about constipation and low motility, Frequency empty bowels every time caths, and usually this is enough unless has diarrhea, and Strain No Fully empty rectum: No does not have urge regularly, does have increased pressure in low back.   Leakage: Yes: with diarrhea wears AAT Pads: Yes: pull ups AAT Fiber supplement: No   URINATION Pain with urination: No Fully empty bladder: No does self cath to empty Stream: self-cath Urgency: No has no urge to have urine void Frequency: self caths 6x per day Leakage:  none Pads: No   INTERCOURSE Pain with intercourse:  not painful internally but will have pain at Rt pelvis or hip Ability to have vaginal penetration:  Yes:   Climax: yes not painful Marinoff Scale: 0/3   PREGNANCY Vaginal deliveries 3 Tearing Yes: tearing with first with stiches, tearing with second C-section deliveries 0 Currently pregnant No   PROLAPSE None       OBJECTIVE:   09/03/22 External Perineal Exam: no sensation Rt labia               Internal Pelvic Floor :  no sensation/decreased sensation superficial/deep pelvic floor - more sensation with palpation closer to coccyx vaginally, no sensation palpated on or lateral to coccyx rectally; significant hypermobility of coccyx; 100% pain reduction in low back/coccyx with posterior pressure on Rt coccyx; increase in low back pain with posterior pressure on Lt coccyx; spasm in Rt OI   Patient confirms identification and approves PT to assess internal pelvic floor and treatment Yes-next session    PELVIC MMT:   MMT eval  Vaginal 1/5 with significant effort   Internal Anal Sphincter  0/5  External Anal Sphincter 0/5 Rt, 1/5 Lt   Puborectalis  1/5  Diastasis Recti    (Blank rows = not tested)         TONE: Low, decreased more on Rt than Lt   PROLAPSE: Grade 2 anterior vaginal wall laxity  08/20/22 PATIENT SURVEYS:  [id number]      COGNITION:            Overall cognitive status: Within functional limits for tasks assessed                          SENSATION:            Light touch: Deficits Rt flank down to toes has decreased sensation, decreased sensation at external genitalis             Proprioception: Deficits Rt side decreased    MUSCLE LENGTH: Rt hamstring and adductor limited by 50%, Lt 25%  GAIT: Comments: decreased mobility at Rt LE, foot drop Rt foot, decreased flexion of Rt knee, hip circumduction noted with gait at Rt, decreased cadence   POSTURE: rounded shoulders, forward head, and posterior pelvic tilt; Lt posterior/Rt anterior rotation; Slight Lt iliac crest elevation  FUNCTIONAL TASK: Lunge: bil instability present, pain with Rt   LUMBARAROM/PROM   A/PROM A/PROM  Eval (% available)  Flexion WFL  Extension 50 with pain Rt  Right lateral flexion WNL  Left lateral flexion 50 limited by Rt side  Right rotation WNL  Left rotation 50 limtied by Rt side   (Blank rows = not tested)   LOWER EXTREMITY ROM:   WFL but tension felt pt pain at Rt side   LOWER EXTREMITY MMT:   MMT  Right Eval All out of 5 Left Eval All out of 5  Hip flexion 2 4  Hip extension 3 4  Hip abduction 2+ 4  Hip adduction 3 4  Hip internal rotation  4 4  Hip external rotation  4 4  Knee flexion 3+ 4+  Knee extension 2+ 4+  Ankle dorsiflexion 0 4  Ankle plantarflexion 1+ 4  Ankle inversion 1+ 4  Ankle eversion 1+ 4    PALPATION: General: Pain throughout Rt lower quadrant, flank, low back; significant scar tissue restriction across low back/sacrum       TODAY'S TREATMENT  09/09/22 Manual: MET to reduce pelvic rotation Shotgun technique to improve pelvic stability Neuromuscular re-education: McConnel taping for pelvic stability (x pattern) Therapeutic activities: Serola belt trial and education    09/04/22 Manual: Trigger Point Dry-Needling  Treatment instructions: Expect mild to moderate muscle  soreness. S/S of pneumothorax if dry needled over a lung field, and to seek immediate medical attention should they occur. Patient verbalized understanding of these instructions and education.  Patient Consent Given: Yes Education handout provided: Yes Muscles treated: Rt obliques Electrical stimulation performed: No Parameters: N/A Treatment response/outcome: twitch response/release Soft tissue mobilization Rt abdomen/obliques Instrument assisted soft tissue mobilization Rt ischial tuberosity, hamstring/glutes  Neuromuscular re-education: Transversus abdominus training with multimodal cues for improved motor control and breath coordination 2 x 5 isometrics core contractions  Supine bil UE ball press 10x Supine hip adduction ball press 10x Side lying UE ball press    09/03/22 Manual: Pt provides verbal consent for internal vaginal/rectal pelvic floor exam. Internal vaginal/rectal pelvic floor muscle assessment Deep pelvic floor muscle release    PATIENT EDUCATION:  Education details: See above Person educated: Patient Education method: Explanation, Demonstration, Tactile cues, Verbal cues, and Handouts Education comprehension: verbalized understanding and returned demonstration     HOME EXERCISE PROGRAM: [id number]   ASSESSMENT:   CLINICAL IMPRESSION: Pt was very sore after last treatment session. Pt discussed benefit of tape due to previous pain reduction. Education performed on serola belt and we tried it with immediate and complete reduction of pain while wearing. She didn't want to attempt at home yet, but chose to trial McConnel taping method. We observed pelvic rotation with supine>sit test and found Lt anterior/Rt posterior rotation that was reduced, but incompletely, after MET and shotgun technique. Taping provided afterward with good tolerance and education for safe removal. She is confident in performing MET and shotgun technique on her own. Once tape comes off, she was  encouraged to use the belt on a consistent basis. She will continue to benefit from skilled PT intervention in order to decrease pain, begin/progress functional strengthening program, improve bowel movements, and improve quality of life.      OBJECTIVE IMPAIRMENTS decreased activity tolerance, decreased coordination, decreased endurance, decreased mobility, difficulty walking, decreased strength, increased fascial restrictions, increased muscle spasms, impaired flexibility, improper body mechanics, postural dysfunction, and pain.    ACTIVITY LIMITATIONS carrying, lifting, sitting, standing, squatting, stairs, continence, toileting, and locomotion level   PARTICIPATION LIMITATIONS: meal prep, cleaning, interpersonal relationship, driving, shopping, community activity, and yard work   PERSONAL FACTORS Past/current experiences, Time since onset of injury/illness/exacerbation, and 3+ comorbidities: medical history  are also affecting patient's functional outcome.    REHAB POTENTIAL: Good   CLINICAL DECISION MAKING: Evolving/moderate complexity   EVALUATION COMPLEXITY: Moderate     GOALS: Goals reviewed with patient? Yes   SHORT TERM GOALS: Target date: 09/24/22   Pt to be I with HEP.  Baseline: Goal status: INITIAL   2.  Pt will report 25% reduction of pain due to improvements in posture, strength, and muscle length  Baseline: 10/10 Goal status: INITIAL   3.  Pt will report her BMs and bladder voids are complete due to improved bowel habits and evacuation techniques.  Baseline:  Goal status: INITIAL     LONG TERM GOALS: Target date: 02/04/23     Pt to be I with advanced HEP.  Baseline:  Goal status: INITIAL   2.  Pt will report 50% reduction of pain due to improvements in posture, strength, and muscle length  Baseline:  Goal status: INITIAL   3.  Pt to be I with pelvic wand/dilator use for pain management outside of PT.  Baseline:  Goal status: INITIAL   4.  Pt to be I  with abdominal massage, voiding mechanics, breathing mechanics for improved pelvic relaxation and decreased pain.  Baseline:  Goal status: INITIAL   5.  Pt to demonstrated improved coordination of pelvic floor and breathing mechanics with functional squat with no increase in pain. Baseline:  Goal status: INITIAL   6.  Pt to demonstrate at least 3/5 pelvic floor strength and ability to fully relax post contraction for improved pelvic stability and decreased strain at pelvic floor. Baseline:  Goal status: INITIAL   PLAN: PT FREQUENCY: 1-2x/week   PT DURATION:  6 months   PLANNED INTERVENTIONS: Therapeutic exercises, Therapeutic activity, Neuromuscular re-education, Patient/Family education, Self Care, Joint mobilization, Aquatic Therapy, Dry Needling, Spinal mobilization, Cryotherapy, Moist heat, scar mobilization, Taping, Vasopneumatic device, Biofeedback, and Manual therapy   PLAN FOR NEXT SESSION: Progress core strengthening; manual techniques as needed.   Julio Alm, PT, DPT05/28/2410:34 AM

## 2022-10-07 ENCOUNTER — Ambulatory Visit: Payer: Medicaid Other | Attending: Neurology

## 2022-10-07 DIAGNOSIS — M62838 Other muscle spasm: Secondary | ICD-10-CM | POA: Diagnosis present

## 2022-10-07 DIAGNOSIS — R279 Unspecified lack of coordination: Secondary | ICD-10-CM | POA: Insufficient documentation

## 2022-10-07 DIAGNOSIS — R293 Abnormal posture: Secondary | ICD-10-CM | POA: Diagnosis present

## 2022-10-07 DIAGNOSIS — M6281 Muscle weakness (generalized): Secondary | ICD-10-CM | POA: Insufficient documentation

## 2022-10-07 DIAGNOSIS — R102 Pelvic and perineal pain: Secondary | ICD-10-CM | POA: Insufficient documentation

## 2022-10-07 NOTE — Therapy (Signed)
OUTPATIENT PHYSICAL THERAPY FEMALE PELVIC TREATMENT   Patient Name: Melissa Pruitt MRN: [id number] DOB:[birth year], 47 y.o., female Today's Date: 10/07/2022  END OF SESSION:  PT End of Session - 10/07/22 0930     Visit Number 5    Date for PT Re-Evaluation 02/04/23    Authorization Type [employer]    Authorization Time Period 08/20/2022-10/18/2022    Authorization - Visit Number 4    Authorization - Number of Visits 6    PT Start Time 0930    PT Stop Time 1010    PT Time Calculation (min) 40 min    Activity Tolerance Patient tolerated treatment well    Behavior During Therapy [facility] for tasks assessed/performed               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   R mid sole superior moderate atypia    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   R med sole inferior moderate atypia    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   R dorsum lat great toe   Dysplastic nevus 06/11/2020   left prox lat thigh sup, - severe   Dysplastic nevus 06/11/2020   left prox lat thigh inf, - moderate   Dysplastic nevus 06/11/2020   R mid to upper back 2.0cm lat to spine, Severe atypia   Dysplastic nevus 06/11/2020   R distal post thigh, moderate atypia   Dysplastic nevus 08/23/2020   Right labia - mild   Dysplastic nevus 08/12/2021   R lat neck post, moderate atypia   Dysplastic nevus 06/11/2012   L dorsal foot, mild atypia, bx from Dr. Larey Dresser   History of recurrent UTIs    HPV (human papilloma virus) infection    HSV (herpes simplex virus) infection    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[facility])    sacro/coccygeal mass excision   Melanoma (HCC) 02/16/2020   R mid plantar sole, BRESLOW'S DEPTH/MAXIMUM TUMOR THICKNESS: 0.5 MM, CLARK/ANATOMIC LEVEL: II Excised 03/20/2020   Normocytic anemia 08/21/2014   Tethered spinal cord (HCC)    Vitamin B 12 deficiency 08/21/2014   Past Surgical  History:  Procedure Laterality Date   BACK SURGERY     BLADDER SURGERY     COLOSTOMY     after flex sig as 47 year old.    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   Other specified disorders of uterus 10/12/2015   Herpes simplex 06/27/2015   Hyperlipidemia 05/30/2015   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   Normocytic anemia 08/21/2014   Vitamin B 12 deficiency 08/21/2014   Allergic rhinitis 01/19/2014   Condyloma acuminatum 01/09/2014   Chronic pain 08/09/2013   Cervical dysplasia 05/09/2013   Other headache syndrome 05/09/2013   Arthralgia of temporomandibular  joint 05/09/2013   Cervical pain 08/25/2012   Pain in joint, pelvic region and thigh 07/16/2012   Tethered spinal cord (HCC) 07/16/2012   Balance disorder 07/16/2012   Personal history of fall 07/16/2012   Neurogenic bladder 06/25/2011   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   OTHER SPECIFIED CONGENITAL ANOMALY SPINAL CORD 05/08/2010   UNSPEC CONGN ANOMALY BRAIN SP CORD&NERV SYSTEM 05/08/2010   H N P-LUMBAR 04/23/2010   BACK PAIN 01/28/2010   Papanicolaou smear of cervix with atypical squamous cells cannot exclude high grade squamous intraepithelial lesion (ASC-H) 04/14/1898    PCP: Orson Eva, NP  REFERRING PROVIDER: Lonell Face, MD  REFERRING DIAG: R10.2 Pelvic pain  THERAPY DIAG:  Pelvic pain  Abnormal  posture  Muscle weakness (generalized)  Unspecified lack of coordination  Other muscle spasm  Rationale for Evaluation and Treatment: Rehabilitation  ONSET DATE: chronic  SUBJECTIVE:                                                                                                                                                                                            SUBJECTIVE STATEMENT: Pt states that she was traveled 12 days to [place] and back. She is being treated for ongoing kidney infection with antibiotic course that she takes every 10 days. She states that she did very well with the tape last session and it lasted for about a week. Majority of pain is over glutes and Rt iliac crest.    PAIN:  Are you having pain? Yes NPRS scale: 8-9/10 currently Pain location: Rt ischial tuberosity with pain down lateral thigh to knee, Rt groin/anterior hip/abdomen, Rt low back, Rt side of tailbone   Pain type: aching, burning, dull, and stabbing  Pain description: constant (but level varies)   Aggravating factors: standing, sitting more than 30 mins Relieving factors: rest, muscle relaxer, taping, pelvic stability belt; popping, pillows for sleeping   PRECAUTIONS: Fall   WEIGHT BEARING RESTRICTIONS No   FALLS:  Has patient fallen in last 6 months? Yes. Number of falls 2   LIVING ENVIRONMENT: Lives with: lives with their family Lives in: House/apartment     OCCUPATION: disabled   PLOF: Independent   PATIENT GOALS to have less pain   PERTINENT HISTORY:  Myelomeningocele with surgical repair,  self-catheterizes for neurogenic bladder, Cervical cancer, HPV , recurrent UTIs, HSV , chronic rt hip pain, melanoma sle of Rt foot has been excised but has foot sensory changes, cervical radiculopathy, trigeminal neuralgia, grade 1 carpal tunnel, left mild chronic C7 radiculopathy   Sexual abuse: No   BOWEL MOVEMENT Pain with bowel movement: No Type  of bowel movement:Type (Bristol Stool  Scale) type 1-2 usually, will have diarrhea intermittently but thinks a lot of this is more about constipation and low motility, Frequency empty bowels every time caths, and usually this is enough unless has diarrhea, and Strain No Fully empty rectum: No does not have urge regularly, does have increased pressure in low back.   Leakage: Yes: with diarrhea wears AAT Pads: Yes: pull ups AAT Fiber supplement: No   URINATION Pain with urination: No Fully empty bladder: No does self cath to empty Stream: self-cath Urgency: No has no urge to have urine void Frequency: self caths 6x per day Leakage:  none Pads: No   INTERCOURSE Pain with intercourse:  not painful internally but will have pain at Rt pelvis or hip Ability to have vaginal penetration:  Yes:   Climax: yes not painful Marinoff Scale: 0/3   PREGNANCY Vaginal deliveries 3 Tearing Yes: tearing with first with stiches, tearing with second C-section deliveries 0 Currently pregnant No   PROLAPSE None       OBJECTIVE:   10/07/22: LUMBAR AROM/PROM   A/PROM A/PROM  (% available)  Flexion WFL  Extension 75 with pain Rt  Right lateral flexion 75, pain with compression on Rt  Left lateral flexion 50 limited by Rt side  Right rotation WNL  Left rotation 75 limtied by Rt side   LOWER EXTREMITY MMT:   MMT Right Eval All out of 5 Left Eval All out of 5  Hip flexion 2 4  Hip extension 3 4  Hip abduction 2+ 4  Hip adduction 3 4  Hip internal rotation  4 4  Hip external rotation  4 4  Knee flexion 3+ 4+  Knee extension 2+ 4+  Ankle dorsiflexion 0 4  Ankle plantarflexion 1+ 4  Ankle inversion 1+ 4  Ankle eversion 1+ 4    External Perineal Exam: no light touch, can feel deep pressure over Rt labia               Internal Pelvic Floor :  internal vaginal assessment only performed; decrease sensation throughout Rt superficial/deep pelvic floor; does have sensation over Rt obturator internus   Patient confirms  identification and approves PT to assess internal pelvic floor and treatment Yes   PELVIC MMT:   MMT eval  Vaginal 1-2/5 with better coordination and less effort, equal bil  (Blank rows = not tested)  09/03/22 External Perineal Exam: no sensation Rt labia               Internal Pelvic Floor :  no sensation/decreased sensation superficial/deep pelvic floor - more sensation with palpation closer to coccyx vaginally, no sensation palpated on or lateral to coccyx rectally; significant hypermobility of coccyx; 100% pain reduction in low back/coccyx with posterior pressure on Rt coccyx; increase in low back pain with posterior pressure on Lt coccyx; spasm in Rt OI   Patient confirms identification and approves PT to assess internal pelvic floor and treatment Yes-next session    PELVIC MMT:   MMT eval  Vaginal 1/5 with significant effort   Internal Anal Sphincter  0/5  External Anal Sphincter 0/5 Rt, 1/5 Lt   Puborectalis  1/5  Diastasis Recti    (Blank rows = not tested)         TONE: Low, decreased more on Rt than Lt   PROLAPSE: Grade 2 anterior vaginal wall laxity  08/20/22  PATIENT SURVEYS:  [id number]     COGNITION:  Overall cognitive status: Within functional limits for tasks assessed                          SENSATION:            Light touch: Deficits Rt flank down to toes has decreased sensation, decreased sensation at external genitalis             Proprioception: Deficits Rt side decreased    MUSCLE LENGTH: Rt hamstring and adductor limited by 50%, Lt 25%  GAIT: Comments: decreased mobility at Rt LE, foot drop Rt foot, decreased flexion of Rt knee, hip circumduction noted with gait at Rt, decreased cadence   POSTURE: rounded shoulders, forward head, and posterior pelvic tilt; Lt posterior/Rt anterior rotation; Slight Lt iliac crest elevation  FUNCTIONAL TASK: Lunge: bil instability present, pain with Rt   LUMBARAROM/PROM   A/PROM A/PROM  Eval (%  available)  Flexion WFL  Extension 50 with pain Rt  Right lateral flexion WNL  Left lateral flexion 50 limited by Rt side  Right rotation WNL  Left rotation 50 limtied by Rt side   (Blank rows = not tested)   LOWER EXTREMITY ROM:   WFL but tension felt pt pain at Rt side   LOWER EXTREMITY MMT:   MMT Right Eval All out of 5 Left Eval All out of 5  Hip flexion 2 4  Hip extension 3 4  Hip abduction 2+ 4  Hip adduction 3 4  Hip internal rotation  4 4  Hip external rotation  4 4  Knee flexion 3+ 4+  Knee extension 2+ 4+  Ankle dorsiflexion 0 4  Ankle plantarflexion 1+ 4  Ankle inversion 1+ 4  Ankle eversion 1+ 4    PALPATION: General: Pain throughout Rt lower quadrant, flank, low back; significant scar tissue restriction across low back/sacrum       TODAY'S TREATMENT  10/07/22 Manual: Pt provides verbal consent for internal vaginal/rectal pelvic floor exam. Internal vaginal/rectal pelvic floor muscle assessment Deep pelvic floor muscle release Neuromuscular re-education: McConnel taping for pelvic stability (x pattern) Therapeutic activities: Education on serola belt and regular use   09/09/22 Manual: MET to reduce pelvic rotation Shotgun technique to improve pelvic stability Neuromuscular re-education: McConnel taping for pelvic stability (x pattern) Therapeutic activities: Serola belt trial and education    09/04/22 Manual: Trigger Point Dry-Needling  Treatment instructions: Expect mild to moderate muscle soreness. S/S of pneumothorax if dry needled over a lung field, and to seek immediate medical attention should they occur. Patient verbalized understanding of these instructions and education.  Patient Consent Given: Yes Education handout provided: Yes Muscles treated: Rt obliques Electrical stimulation performed: No Parameters: N/A Treatment response/outcome: twitch response/release Soft tissue mobilization Rt abdomen/obliques Instrument assisted  soft tissue mobilization Rt ischial tuberosity, hamstring/glutes  Neuromuscular re-education: Transversus abdominus training with multimodal cues for improved motor control and breath coordination 2 x 5 isometrics core contractions  Supine bil UE ball press 10x Supine hip adduction ball press 10x Side lying UE ball press    PATIENT EDUCATION:  Education details: See above Person educated: Patient Education method: Programmer, multimedia, Demonstration, Tactile cues, Verbal cues, and Handouts Education comprehension: verbalized understanding and returned demonstration     HOME EXERCISE PROGRAM: [id number]   ASSESSMENT:   CLINICAL IMPRESSION: Pt has made some improvements since re-starting pelvic floor physical therapy, but has been limited due to traveling extensively in this time (has only had 4 visits). She is  demonstrating improvements in pelvic floor strength and coordination of active contraction - she now is able to find contraction in Rt pelvic floor where she could not several weeks ago. Believe core training has been very helpful with this. She still has notable pain in Rt obturator internus that reproduces much of her pain with daily activities; after release of this area today she did not have any of this specific pain. With good benefit form taping last session, believe she will benefit from use of serola belt on a regular basis; pt is hesitant to use this and did want taping today; she was encouraged to bring in belt next session and we can make sure she is wearing appropriately. Her lumbar A/ROM is improving as well. No differences in Bil LE strength at this time. We plan to begin reviewing appropriate voiding techniques next session with relaxation training to help with complete emptying of bowel and bladder. Due to chronic nature of condition that includes neurological deficits, believe duration of physical therapy may be long term in order to manage pain and optimize function; we will have  better idea of this as she is able to regularly attend visits moving forward (has been limited due to travel and high demand of specific therapy services up to this point). She will continue to benefit from skilled PT intervention in order to decrease pain, begin/progress functional strengthening program, improve bowel movements, and improve quality of life.      OBJECTIVE IMPAIRMENTS decreased activity tolerance, decreased coordination, decreased endurance, decreased mobility, difficulty walking, decreased strength, increased fascial restrictions, increased muscle spasms, impaired flexibility, improper body mechanics, postural dysfunction, and pain.    ACTIVITY LIMITATIONS carrying, lifting, sitting, standing, squatting, stairs, continence, toileting, and locomotion level   PARTICIPATION LIMITATIONS: meal prep, cleaning, interpersonal relationship, driving, shopping, community activity, and yard work   PERSONAL FACTORS Past/current experiences, Time since onset of injury/illness/exacerbation, and 3+ comorbidities: medical history  are also affecting patient's functional outcome.    REHAB POTENTIAL: Good   CLINICAL DECISION MAKING: Evolving/moderate complexity   EVALUATION COMPLEXITY: Moderate     GOALS: Goals reviewed with patient? Yes   SHORT TERM GOALS: Target date: 09/24/22 - updated 10/07/22   Pt to be I with HEP.  Baseline: Goal status: MET 10/07/22   2.  Pt will report 25% reduction of pain due to improvements in posture, strength, and muscle length  Baseline: pt currently 8-9/10 pain and does not feel like there has been long lasting improvement in pain Goal status: IN PROGRESS   3.  Pt will report her BMs and bladder voids are complete due to improved bowel habits and evacuation techniques.  Baseline: no change Goal status: IN PROGRESS     LONG TERM GOALS: Target date: 02/04/23 - updated 10/07/22    Pt to be I with advanced HEP.  Baseline:  Goal status: IN PROGRESS   2.   Pt will report 50% reduction of pain due to improvements in posture, strength, and muscle length  Baseline: no significant improvements at this time Goal status: IN PROGRESS   3.  Pt to be I with pelvic wand/dilator use for pain management outside of PT.  Baseline: has not returned to wand use at this time Goal status: IN PROGRESS   4.  Pt to be I with abdominal massage, voiding mechanics, breathing mechanics for improved pelvic relaxation and decreased pain.  Baseline: Pt has been working on relaxation techniques, but has not started bowel massage or been able to  go over voiding mechanics yet Goal status: IN PROGRESS   5.  Pt to demonstrated improved coordination of pelvic floor and breathing mechanics with functional squat with no increase in pain. Baseline:  Goal status: IN PROGRESS   6.  Pt to demonstrate at least 3/5 pelvic floor strength and ability to fully relax post contraction for improved pelvic stability and decreased strain at pelvic floor. Baseline: improved to 1-2/5 Goal status: IN PROGRESS   PLAN: PT FREQUENCY: 1-2x/week   PT DURATION:  6 months   PLANNED INTERVENTIONS: Therapeutic exercises, Therapeutic activity, Neuromuscular re-education, Patient/Family education, Self Care, Joint mobilization, Aquatic Therapy, Dry Needling, Spinal mobilization, Cryotherapy, Moist heat, scar mobilization, Taping, Vasopneumatic device, Biofeedback, and Manual therapy   PLAN FOR NEXT SESSION: voiding mechanics, diaphragmatic breathing  Julio Alm, PT, DPT06/25/2410:50 AM

## 2022-10-10 ENCOUNTER — Encounter: Payer: Self-pay | Admitting: Obstetrics and Gynecology

## 2022-10-10 ENCOUNTER — Other Ambulatory Visit: Payer: Self-pay | Admitting: Obstetrics and Gynecology

## 2022-10-10 DIAGNOSIS — N390 Urinary tract infection, site not specified: Secondary | ICD-10-CM

## 2022-10-10 NOTE — Telephone Encounter (Signed)
Spoke with patient on 6/28

## 2022-10-18 ENCOUNTER — Encounter: Payer: Self-pay | Admitting: Obstetrics and Gynecology

## 2022-10-18 DIAGNOSIS — R110 Nausea: Secondary | ICD-10-CM

## 2022-10-18 DIAGNOSIS — N3000 Acute cystitis without hematuria: Secondary | ICD-10-CM

## 2022-10-18 DIAGNOSIS — R1031 Right lower quadrant pain: Secondary | ICD-10-CM

## 2022-10-21 ENCOUNTER — Other Ambulatory Visit (HOSPITAL_COMMUNITY)
Admission: RE | Admit: 2022-10-21 | Discharge: 2022-10-21 | Disposition: A | Discharge status: Home / Self Care | Payer: Medicaid Other | Source: Other Acute Inpatient Hospital | Attending: Obstetrics and Gynecology | Admitting: Obstetrics and Gynecology

## 2022-10-21 ENCOUNTER — Ambulatory Visit (INDEPENDENT_AMBULATORY_CARE_PROVIDER_SITE_OTHER): Payer: Medicaid Other

## 2022-10-21 DIAGNOSIS — N390 Urinary tract infection, site not specified: Secondary | ICD-10-CM

## 2022-10-21 DIAGNOSIS — R82998 Other abnormal findings in urine: Secondary | ICD-10-CM

## 2022-10-21 DIAGNOSIS — R319 Hematuria, unspecified: Secondary | ICD-10-CM

## 2022-10-21 DIAGNOSIS — R350 Frequency of micturition: Secondary | ICD-10-CM | POA: Diagnosis not present

## 2022-10-21 LAB — POCT URINALYSIS DIPSTICK
Bilirubin, UA: NEGATIVE
Glucose, UA: NEGATIVE
Ketones, UA: NEGATIVE
Nitrite, UA: NEGATIVE
Protein, UA: NEGATIVE
Spec Grav, UA: 1.01 (ref 1.010–1.025)
Urobilinogen, UA: 0.2 E.U./dL
pH, UA: 6 (ref 5.0–8.0)

## 2022-10-21 NOTE — Patient Instructions (Addendum)
Your Urine dip that was done in office was Positive. I am sending the urine off for culture and you can take AZO over the counter for your discomfort, hopefully this gives you some relief. We will contact you when the results are back between 3-5 days. If an antibiotic is needed we will sent the order to the pharmacy and you will be notified. If you have any questions or concerns please feel free to call us at [phone]

## 2022-10-21 NOTE — Progress Notes (Signed)
Melissa Pruitt is a 47 y.o. female arrived today with UTI sx.  Per Dr. Jari Favre protocol: A urine specimen was collected and POCT Urine was done and urine culture sent to the lab. POCT Urine was Positive. Pt was notified

## 2022-10-22 ENCOUNTER — Ambulatory Visit: Payer: Medicaid Other | Attending: Neurology

## 2022-10-22 DIAGNOSIS — R102 Pelvic and perineal pain: Secondary | ICD-10-CM | POA: Diagnosis present

## 2022-10-22 DIAGNOSIS — M6281 Muscle weakness (generalized): Secondary | ICD-10-CM

## 2022-10-22 DIAGNOSIS — M5459 Other low back pain: Secondary | ICD-10-CM | POA: Diagnosis present

## 2022-10-22 DIAGNOSIS — R279 Unspecified lack of coordination: Secondary | ICD-10-CM | POA: Diagnosis present

## 2022-10-22 DIAGNOSIS — M62838 Other muscle spasm: Secondary | ICD-10-CM | POA: Diagnosis present

## 2022-10-22 DIAGNOSIS — R293 Abnormal posture: Secondary | ICD-10-CM | POA: Diagnosis present

## 2022-10-22 LAB — URINE CULTURE: Culture: >=100000 — AB

## 2022-10-22 MED ORDER — ONDANSETRON HCL 4 MG PO TABS
4.0000 mg | ORAL_TABLET | ORAL | 0 refills | Status: DC | PRN
Start: 2022-10-22 — End: 2022-11-10

## 2022-10-22 NOTE — Therapy (Signed)
OUTPATIENT PHYSICAL THERAPY FEMALE PELVIC TREATMENT   Patient Name: Melissa Pruitt MRN: [id number] DOB:[birth year], 47 y.o., female Today's Date: 10/22/2022  END OF SESSION:  PT End of Session - 10/22/22 1102     Visit Number 6    Date for PT Re-Evaluation 02/04/23    Authorization Type [employer]    Authorization Time Period 10/07/2022 -12/05/2022    Authorization - Visit Number 1    Authorization - Number of Visits 5    PT Start Time 1100    PT Stop Time 1140    PT Time Calculation (min) 40 min    Activity Tolerance Patient tolerated treatment well    Behavior During Therapy [facility] for tasks assessed/performed               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   R mid sole superior moderate atypia    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   R med sole inferior moderate atypia    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   R dorsum lat great toe   Dysplastic nevus 06/11/2020   left prox lat thigh sup, - severe   Dysplastic nevus 06/11/2020   left prox lat thigh inf, - moderate   Dysplastic nevus 06/11/2020   R mid to upper back 2.0cm lat to spine, Severe atypia   Dysplastic nevus 06/11/2020   R distal post thigh, moderate atypia   Dysplastic nevus 08/23/2020   Right labia - mild   Dysplastic nevus 08/12/2021   R lat neck post, moderate atypia   Dysplastic nevus 06/11/2012   L dorsal foot, mild atypia, bx from Dr. Larey Dresser   History of recurrent UTIs    HPV (human papilloma virus) infection    HSV (herpes simplex virus) infection    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[facility])    sacro/coccygeal mass excision   Melanoma (HCC) 02/16/2020   R mid plantar sole, BRESLOW'S DEPTH/MAXIMUM TUMOR THICKNESS: 0.5 MM, CLARK/ANATOMIC LEVEL: II Excised 03/20/2020   Normocytic anemia 08/21/2014   Tethered spinal cord (HCC)    Vitamin B 12 deficiency 08/21/2014   Past Surgical  History:  Procedure Laterality Date   BACK SURGERY     BLADDER SURGERY     COLOSTOMY     after flex sig as 47 year old.    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   Other specified disorders of uterus 10/12/2015   Herpes simplex 06/27/2015   Hyperlipidemia 05/30/2015   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   Normocytic anemia 08/21/2014   Vitamin B 12 deficiency 08/21/2014   Allergic rhinitis 01/19/2014   Condyloma acuminatum 01/09/2014   Chronic pain 08/09/2013   Cervical dysplasia 05/09/2013   Other headache syndrome 05/09/2013   Arthralgia of  temporomandibular joint 05/09/2013   Cervical pain 08/25/2012   Pain in joint, pelvic region and thigh 07/16/2012   Tethered spinal cord (HCC) 07/16/2012   Balance disorder 07/16/2012   Personal history of fall 07/16/2012   Neurogenic bladder 06/25/2011   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   OTHER SPECIFIED CONGENITAL ANOMALY SPINAL CORD 05/08/2010   UNSPEC CONGN ANOMALY BRAIN SP CORD&NERV SYSTEM 05/08/2010   H N P-LUMBAR 04/23/2010   BACK PAIN 01/28/2010   Papanicolaou smear of cervix with atypical squamous cells cannot exclude high grade squamous intraepithelial lesion (ASC-H) 04/14/1898    PCP: Orson Eva, NP  REFERRING PROVIDER: Lonell Face, MD  REFERRING DIAG: R10.2 Pelvic pain  THERAPY DIAG:  Pelvic pain  Abnormal  posture  Muscle weakness (generalized)  Unspecified lack of coordination  Other muscle spasm  Other low back pain  Rationale for Evaluation and Treatment: Rehabilitation  ONSET DATE: chronic  SUBJECTIVE:                                                                                                                                                                                            SUBJECTIVE STATEMENT: Pt still working with MD on UTI treatment since she is not better. She continues to have Rt pelvic pain.   PAIN:  Are you having pain? Yes NPRS scale: 7/10 currently Pain location: Rt ischial tuberosity with pain down lateral thigh to knee, Rt groin/anterior hip/abdomen, Rt low back, Rt side of tailbone   Pain type: aching, burning, dull, and stabbing  Pain description: constant (but level varies)   Aggravating factors: standing, sitting more than 30 mins Relieving factors: rest, muscle relaxer, taping, pelvic stability belt; popping, pillows for sleeping   PRECAUTIONS: Fall   WEIGHT BEARING RESTRICTIONS No   FALLS:  Has patient fallen in last 6 months? Yes. Number of falls 2   LIVING ENVIRONMENT: Lives with: lives with their family Lives in: House/apartment     OCCUPATION: disabled   PLOF: Independent   PATIENT GOALS to have less pain   PERTINENT HISTORY:  Myelomeningocele with surgical repair,  self-catheterizes for neurogenic bladder, Cervical cancer, HPV , recurrent UTIs, HSV , chronic rt hip pain, melanoma sle of Rt foot has been excised but has foot sensory changes, cervical radiculopathy, trigeminal neuralgia, grade 1 carpal tunnel, left mild chronic C7 radiculopathy   Sexual abuse: No   BOWEL MOVEMENT Pain with bowel movement: No Type of bowel movement:Type (Bristol Stool Scale) type 1-2 usually, will have diarrhea intermittently but thinks a lot of this is more about constipation and low motility, Frequency empty bowels every time caths, and  usually this is enough unless has diarrhea, and Strain No Fully empty rectum: No does not have urge regularly, does have increased pressure in low back.   Leakage: Yes: with diarrhea wears AAT Pads: Yes: pull ups AAT Fiber supplement: No   URINATION Pain with urination: No Fully empty bladder: No does self cath to empty Stream: self-cath Urgency: No has no urge to have urine void Frequency: self caths 6x per day Leakage:  none Pads: No   INTERCOURSE Pain with intercourse:  not painful internally but will have pain at Rt pelvis or hip Ability to have vaginal penetration:  Yes:   Climax: yes not painful Marinoff Scale: 0/3   PREGNANCY Vaginal deliveries 3 Tearing Yes: tearing with first with stiches, tearing with second C-section deliveries 0 Currently pregnant No   PROLAPSE None       OBJECTIVE:   10/07/22: LUMBAR AROM/PROM   A/PROM A/PROM  (% available)  Flexion WFL  Extension 75 with pain Rt  Right lateral flexion 75, pain with compression on Rt  Left lateral flexion 50 limited by Rt side  Right rotation WNL  Left rotation 75 limtied by Rt side   LOWER EXTREMITY MMT:   MMT Right Eval All out of 5 Left Eval All out of 5  Hip flexion 2 4  Hip extension 3 4  Hip abduction 2+ 4  Hip adduction 3 4  Hip internal rotation  4 4  Hip external rotation  4 4  Knee flexion 3+ 4+  Knee extension 2+ 4+  Ankle dorsiflexion 0 4  Ankle plantarflexion 1+ 4  Ankle inversion 1+ 4  Ankle eversion 1+ 4    External Perineal Exam: no light touch, can feel deep pressure over Rt labia               Internal Pelvic Floor :  internal vaginal assessment only performed; decrease sensation throughout Rt superficial/deep pelvic floor; does have sensation over Rt obturator internus   Patient confirms identification and approves PT to assess internal pelvic floor and treatment Yes   PELVIC MMT:   MMT eval  Vaginal 1-2/5 with better coordination and less effort, equal bil   (Blank rows = not tested)  09/03/22 External Perineal Exam: no sensation Rt labia               Internal Pelvic Floor :  no sensation/decreased sensation superficial/deep pelvic floor - more sensation with palpation closer to coccyx vaginally, no sensation palpated on or lateral to coccyx rectally; significant hypermobility of coccyx; 100% pain reduction in low back/coccyx with posterior pressure on Rt coccyx; increase in low back pain with posterior pressure on Lt coccyx; spasm in Rt OI   Patient confirms identification and approves PT to assess internal pelvic floor and treatment Yes-next session    PELVIC MMT:   MMT eval  Vaginal 1/5 with significant effort   Internal Anal Sphincter  0/5  External Anal Sphincter 0/5 Rt, 1/5 Lt   Puborectalis  1/5  Diastasis Recti    (Blank rows = not tested)         TONE: Low, decreased more on Rt than Lt   PROLAPSE: Grade 2 anterior vaginal wall laxity  08/20/22  PATIENT SURVEYS:  [id number]     COGNITION:            Overall cognitive status: Within functional limits for tasks assessed  SENSATION:            Light touch: Deficits Rt flank down to toes has decreased sensation, decreased sensation at external genitalis             Proprioception: Deficits Rt side decreased    MUSCLE LENGTH: Rt hamstring and adductor limited by 50%, Lt 25%  GAIT: Comments: decreased mobility at Rt LE, foot drop Rt foot, decreased flexion of Rt knee, hip circumduction noted with gait at Rt, decreased cadence   POSTURE: rounded shoulders, forward head, and posterior pelvic tilt; Lt posterior/Rt anterior rotation; Slight Lt iliac crest elevation  FUNCTIONAL TASK: Lunge: bil instability present, pain with Rt   LUMBARAROM/PROM   A/PROM A/PROM  Eval (% available)  Flexion WFL  Extension 50 with pain Rt  Right lateral flexion WNL  Left lateral flexion 50 limited by Rt side  Right rotation WNL  Left rotation 50 limtied by Rt  side   (Blank rows = not tested)   LOWER EXTREMITY ROM:   WFL but tension felt pt pain at Rt side   LOWER EXTREMITY MMT:   MMT Right Eval All out of 5 Left Eval All out of 5  Hip flexion 2 4  Hip extension 3 4  Hip abduction 2+ 4  Hip adduction 3 4  Hip internal rotation  4 4  Hip external rotation  4 4  Knee flexion 3+ 4+  Knee extension 2+ 4+  Ankle dorsiflexion 0 4  Ankle plantarflexion 1+ 4  Ankle inversion 1+ 4  Ankle eversion 1+ 4    PALPATION: General: Pain throughout Rt lower quadrant, flank, low back; significant scar tissue restriction across low back/sacrum       TODAY'S TREATMENT  10/22/22 Manual: Pt provides verbal consent for internal vaginal/rectal pelvic floor exam. Internal vaginal/rectal pelvic floor muscle assessment Deep pelvic floor muscle release Therapeutic activities: HEP folder review/modifications     10/07/22 Manual: Pt provides verbal consent for internal vaginal/rectal pelvic floor exam. Internal vaginal/rectal pelvic floor muscle assessment Deep pelvic floor muscle release Neuromuscular re-education: McConnel taping for pelvic stability (x pattern) Therapeutic activities: Education on serola belt and regular use   09/09/22 Manual: MET to reduce pelvic rotation Shotgun technique to improve pelvic stability Neuromuscular re-education: McConnel taping for pelvic stability (x pattern) Therapeutic activities: Serola belt trial and education     PATIENT EDUCATION:  Education details: See above Person educated: Patient Education method: Programmer, multimedia, Facilities manager, Actor cues, Verbal cues, and Handouts Education comprehension: verbalized understanding and returned demonstration     HOME EXERCISE PROGRAM: [id number]   ASSESSMENT:   CLINICAL IMPRESSION: Pt brought exercise binder full of activities she has been given over the years. We reviewed in detail. She was instructed not to perform strengthen activities on only one  side due to bil weakness - we can provide specific exercises to help with pelvic alignment next session. With internal pelvic floor muscle release, pt demonstrates relief of all low back/Rt LE pain with compression of levator ani/sacrotuberous ligament, possibly indicating ligamentous laxity that could be impacting pelvic stability/pelvic pain. She reported good improvement in pain at end of session. We discussed how to perform pelvic floor muscle release to this area at home. She will continue to benefit from skilled PT intervention in order to decrease pain, begin/progress functional strengthening program, improve bowel movements, and improve quality of life.      OBJECTIVE IMPAIRMENTS decreased activity tolerance, decreased coordination, decreased endurance, decreased mobility, difficulty walking, decreased strength, increased fascial  restrictions, increased muscle spasms, impaired flexibility, improper body mechanics, postural dysfunction, and pain.    ACTIVITY LIMITATIONS carrying, lifting, sitting, standing, squatting, stairs, continence, toileting, and locomotion level   PARTICIPATION LIMITATIONS: meal prep, cleaning, interpersonal relationship, driving, shopping, community activity, and yard work   PERSONAL FACTORS Past/current experiences, Time since onset of injury/illness/exacerbation, and 3+ comorbidities: medical history  are also affecting patient's functional outcome.    REHAB POTENTIAL: Good   CLINICAL DECISION MAKING: Evolving/moderate complexity   EVALUATION COMPLEXITY: Moderate     GOALS: Goals reviewed with patient? Yes   SHORT TERM GOALS: Target date: 09/24/22 - updated 10/07/22   Pt to be I with HEP.  Baseline: Goal status: MET 10/07/22   2.  Pt will report 25% reduction of pain due to improvements in posture, strength, and muscle length  Baseline: pt currently 8-9/10 pain and does not feel like there has been long lasting improvement in pain Goal status: IN PROGRESS    3.  Pt will report her BMs and bladder voids are complete due to improved bowel habits and evacuation techniques.  Baseline: no change Goal status: IN PROGRESS     LONG TERM GOALS: Target date: 02/04/23 - updated 10/07/22    Pt to be I with advanced HEP.  Baseline:  Goal status: IN PROGRESS   2.  Pt will report 50% reduction of pain due to improvements in posture, strength, and muscle length  Baseline: no significant improvements at this time Goal status: IN PROGRESS   3.  Pt to be I with pelvic wand/dilator use for pain management outside of PT.  Baseline: has not returned to wand use at this time Goal status: IN PROGRESS   4.  Pt to be I with abdominal massage, voiding mechanics, breathing mechanics for improved pelvic relaxation and decreased pain.  Baseline: Pt has been working on relaxation techniques, but has not started bowel massage or been able to go over voiding mechanics yet Goal status: IN PROGRESS   5.  Pt to demonstrated improved coordination of pelvic floor and breathing mechanics with functional squat with no increase in pain. Baseline:  Goal status: IN PROGRESS   6.  Pt to demonstrate at least 3/5 pelvic floor strength and ability to fully relax post contraction for improved pelvic stability and decreased strain at pelvic floor. Baseline: improved to 1-2/5 Goal status: IN PROGRESS   PLAN: PT FREQUENCY: 1-2x/week   PT DURATION:  6 months   PLANNED INTERVENTIONS: Therapeutic exercises, Therapeutic activity, Neuromuscular re-education, Patient/Family education, Self Care, Joint mobilization, Aquatic Therapy, Dry Needling, Spinal mobilization, Cryotherapy, Moist heat, scar mobilization, Taping, Vasopneumatic device, Biofeedback, and Manual therapy   PLAN FOR NEXT SESSION: Pelvic alignment assessment/treatment; begin progressing strengthening activities.   Julio Alm, PT, DPT07/01/2410:03 AM

## 2022-10-23 LAB — URINE CULTURE

## 2022-10-23 MED ORDER — CEFUROXIME AXETIL 250 MG PO TABS: 250.0000 mg | ORAL_TABLET | Freq: Two times a day (BID) | ORAL | 0 refills | Status: AC

## 2022-10-23 NOTE — Addendum Note (Signed)
Addended by: Selmer Dominion on: 10/23/2022 09:59 AM   Modules accepted: Orders

## 2022-10-27 ENCOUNTER — Other Ambulatory Visit: Payer: Self-pay | Admitting: Obstetrics and Gynecology

## 2022-10-27 DIAGNOSIS — R110 Nausea: Secondary | ICD-10-CM

## 2022-10-27 MED ORDER — PROMETHAZINE HCL 12.5 MG PO TABS
12.5000 mg | ORAL_TABLET | Freq: Three times a day (TID) | ORAL | 0 refills | Status: DC | PRN
Start: 2022-10-27 — End: 2022-11-12

## 2022-10-27 NOTE — Progress Notes (Signed)
Called and spoke to patient regarding her recurrent UTI. She is very frustrated as she has continued to have multiple UTI's.   She has been taking oral tylenol to try and control the fevers. She has continued to have pain in the right side of her body that runs around to the back right side that she feels is in the kidney. She reports she is not having good results on the ceftin with reducing her symptoms. She has multiple allergies to antibiotics and we are limited in what we can give.  Plan for patient to return for a catheterized urine sample tomorrow and will order CBC and CMP to evaluate kidney function and WBC count with her fevers. May need to change antibiotics.

## 2022-10-28 ENCOUNTER — Other Ambulatory Visit (HOSPITAL_COMMUNITY)
Admission: RE | Admit: 2022-10-28 | Discharge: 2022-10-28 | Disposition: A | Discharge status: Home / Self Care | Payer: Medicaid Other | Source: Ambulatory Visit | Attending: Obstetrics and Gynecology | Admitting: Obstetrics and Gynecology

## 2022-10-28 ENCOUNTER — Ambulatory Visit: Payer: Medicaid Other

## 2022-10-28 ENCOUNTER — Ambulatory Visit: Payer: Medicaid Other | Admitting: Obstetrics and Gynecology

## 2022-10-28 ENCOUNTER — Encounter: Payer: Self-pay | Admitting: Obstetrics and Gynecology

## 2022-10-28 VITALS — BP 123/81 | HR 75

## 2022-10-28 DIAGNOSIS — R279 Unspecified lack of coordination: Secondary | ICD-10-CM

## 2022-10-28 DIAGNOSIS — R5081 Fever presenting with conditions classified elsewhere: Secondary | ICD-10-CM | POA: Diagnosis not present

## 2022-10-28 DIAGNOSIS — R102 Pelvic and perineal pain: Secondary | ICD-10-CM | POA: Diagnosis not present

## 2022-10-28 DIAGNOSIS — M6281 Muscle weakness (generalized): Secondary | ICD-10-CM

## 2022-10-28 DIAGNOSIS — N390 Urinary tract infection, site not specified: Secondary | ICD-10-CM | POA: Diagnosis not present

## 2022-10-28 DIAGNOSIS — R293 Abnormal posture: Secondary | ICD-10-CM

## 2022-10-28 DIAGNOSIS — M62838 Other muscle spasm: Secondary | ICD-10-CM

## 2022-10-28 DIAGNOSIS — M5459 Other low back pain: Secondary | ICD-10-CM

## 2022-10-28 LAB — POCT URINALYSIS DIPSTICK
Bilirubin, UA: NEGATIVE
Blood, UA: NEGATIVE
Glucose, UA: NEGATIVE
Ketones, UA: NEGATIVE
Leukocytes, UA: NEGATIVE
Nitrite, UA: NEGATIVE
Protein, UA: NEGATIVE
Spec Grav, UA: 1.02 (ref 1.010–1.025)
Urobilinogen, UA: 0.2 E.U./dL
pH, UA: 6 (ref 5.0–8.0)

## 2022-10-28 NOTE — Therapy (Signed)
OUTPATIENT PHYSICAL THERAPY FEMALE PELVIC TREATMENT   Patient Name: Melissa Pruitt MRN: [id number] DOB:Jul 27, 1975, 47 y.o., female Today's Date: 10/28/2022  END OF SESSION:  PT End of Session - 10/28/22 1100     Visit Number 7    Date for PT Re-Evaluation 02/04/23    Authorization Type [employer]    Authorization Time Period 10/07/2022 -12/05/2022    Authorization - Visit Number 2    Authorization - Number of Visits 5    PT Start Time 1100    PT Stop Time 1140    PT Time Calculation (min) 40 min    Activity Tolerance Patient tolerated treatment well    Behavior During Therapy [facility] for tasks assessed/performed               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   R mid sole superior moderate atypia    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   R med sole inferior moderate atypia    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   R dorsum lat great toe   Dysplastic nevus 06/11/2020   left prox lat thigh sup, - severe   Dysplastic nevus 06/11/2020   left prox lat thigh inf, - moderate   Dysplastic nevus 06/11/2020   R mid to upper back 2.0cm lat to spine, Severe atypia   Dysplastic nevus 06/11/2020   R distal post thigh, moderate atypia   Dysplastic nevus 08/23/2020   Right labia - mild   Dysplastic nevus 08/12/2021   R lat neck post, moderate atypia   Dysplastic nevus 06/11/2012   L dorsal foot, mild atypia, bx from Dr. Larey Dresser   History of recurrent UTIs    HPV (human papilloma virus) infection    HSV (herpes simplex virus) infection    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[facility])    sacro/coccygeal mass excision   Melanoma (HCC) 02/16/2020   R mid plantar sole, BRESLOW'S DEPTH/MAXIMUM TUMOR THICKNESS: 0.5 MM, CLARK/ANATOMIC LEVEL: II Excised 03/20/2020   Normocytic anemia 08/21/2014   Tethered spinal cord (HCC)    Vitamin B 12 deficiency 08/21/2014   Past Surgical  History:  Procedure Laterality Date   BACK SURGERY     BLADDER SURGERY     COLOSTOMY     after flex sig as 47 year old.    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   Other specified disorders of uterus 10/12/2015   Herpes simplex 06/27/2015   Hyperlipidemia 05/30/2015   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   Normocytic anemia 08/21/2014   Vitamin B 12 deficiency 08/21/2014   Allergic rhinitis 01/19/2014   Condyloma acuminatum 01/09/2014   Chronic pain 08/09/2013   Cervical dysplasia 05/09/2013   Other headache syndrome 05/09/2013   Arthralgia of  temporomandibular joint 05/09/2013   Cervical pain 08/25/2012   Pain in joint, pelvic region and thigh 07/16/2012   Tethered spinal cord (HCC) 07/16/2012   Balance disorder 07/16/2012   Personal history of fall 07/16/2012   Neurogenic bladder 06/25/2011   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   OTHER SPECIFIED CONGENITAL ANOMALY SPINAL CORD 05/08/2010   UNSPEC CONGN ANOMALY BRAIN SP CORD&NERV SYSTEM 05/08/2010   H N P-LUMBAR 04/23/2010   BACK PAIN 01/28/2010   Papanicolaou smear of cervix with atypical squamous cells cannot exclude high grade squamous intraepithelial lesion (ASC-H) 04/14/1898    PCP: Orson Eva, NP  REFERRING PROVIDER: Lonell Face, MD  REFERRING DIAG: R10.2 Pelvic pain  THERAPY DIAG:  Pelvic pain  Abnormal  posture  Muscle weakness (generalized)  Unspecified lack of coordination  Other muscle spasm  Other low back pain  Rationale for Evaluation and Treatment: Rehabilitation  ONSET DATE: chronic  SUBJECTIVE:                                                                                                                                                                                            SUBJECTIVE STATEMENT: Pt states that she is having significant Rt sided pain that feels like it is due to her infection - she is going back to the doctor this afternoon to get another urinalysis. She reports doing very well and did not have major stabbing pain in Rt buttocks that refers into LE.   PAIN:  Are you having pain? Yes NPRS scale: 8-9/10 Pain location: Rt ischial tuberosity with pain down lateral thigh to knee, Rt groin/anterior hip/abdomen, Rt low back, Rt side of tailbone   Pain type: aching, burning, dull, and stabbing  Pain description: constant (but level varies)   Aggravating factors: standing, sitting more than 30 mins Relieving factors: rest, muscle relaxer, taping, pelvic stability belt; popping, pillows for sleeping   PRECAUTIONS: Fall   WEIGHT BEARING RESTRICTIONS No   FALLS:  Has patient fallen in last 6 months? Yes. Number of falls 2   LIVING ENVIRONMENT: Lives with: lives with their family Lives in: House/apartment     OCCUPATION: disabled   PLOF: Independent   PATIENT GOALS to have less pain   PERTINENT HISTORY:  Myelomeningocele with surgical repair,  self-catheterizes for neurogenic bladder, Cervical cancer, HPV , recurrent UTIs, HSV , chronic rt hip pain, melanoma sle of Rt foot has been excised but has foot sensory changes, cervical radiculopathy, trigeminal neuralgia, grade 1 carpal tunnel, left mild chronic C7 radiculopathy   Sexual abuse: No   BOWEL MOVEMENT Pain with bowel movement: No Type of bowel  movement:Type (Bristol Stool Scale) type 1-2  usually, will have diarrhea intermittently but thinks a lot of this is more about constipation and low motility, Frequency empty bowels every time caths, and usually this is enough unless has diarrhea, and Strain No Fully empty rectum: No does not have urge regularly, does have increased pressure in low back.   Leakage: Yes: with diarrhea wears AAT Pads: Yes: pull ups AAT Fiber supplement: No   URINATION Pain with urination: No Fully empty bladder: No does self cath to empty Stream: self-cath Urgency: No has no urge to have urine void Frequency: self caths 6x per day Leakage:  none Pads: No   INTERCOURSE Pain with intercourse:  not painful internally but will have pain at Rt pelvis or hip Ability to have vaginal penetration:  Yes:   Climax: yes not painful Marinoff Scale: 0/3   PREGNANCY Vaginal deliveries 3 Tearing Yes: tearing with first with stiches, tearing with second C-section deliveries 0 Currently pregnant No   PROLAPSE None       OBJECTIVE:   10/28/22 Significant Lt anterior pelvic rotation/posterior Rt rotation  10/07/22: LUMBAR AROM/PROM   A/PROM A/PROM  (% available)  Flexion WFL  Extension 75 with pain Rt  Right lateral flexion 75, pain with compression on Rt  Left lateral flexion 50 limited by Rt side  Right rotation WNL  Left rotation 75 limtied by Rt side   LOWER EXTREMITY MMT:   MMT Right Eval All out of 5 Left Eval All out of 5  Hip flexion 2 4  Hip extension 3 4  Hip abduction 2+ 4  Hip adduction 3 4  Hip internal rotation  4 4  Hip external rotation  4 4  Knee flexion 3+ 4+  Knee extension 2+ 4+  Ankle dorsiflexion 0 4  Ankle plantarflexion 1+ 4  Ankle inversion 1+ 4  Ankle eversion 1+ 4    External Perineal Exam: no light touch, can feel deep pressure over Rt labia               Internal Pelvic Floor :  internal vaginal assessment only performed; decrease sensation throughout Rt superficial/deep pelvic floor; does have  sensation over Rt obturator internus   Patient confirms identification and approves PT to assess internal pelvic floor and treatment Yes   PELVIC MMT:   MMT eval  Vaginal 1-2/5 with better coordination and less effort, equal bil  (Blank rows = not tested)  09/03/22 External Perineal Exam: no sensation Rt labia               Internal Pelvic Floor :  no sensation/decreased sensation superficial/deep pelvic floor - more sensation with palpation closer to coccyx vaginally, no sensation palpated on or lateral to coccyx rectally; significant hypermobility of coccyx; 100% pain reduction in low back/coccyx with posterior pressure on Rt coccyx; increase in low back pain with posterior pressure on Lt coccyx; spasm in Rt OI   Patient confirms identification and approves PT to assess internal pelvic floor and treatment Yes-next session    PELVIC MMT:   MMT eval  Vaginal 1/5 with significant effort   Internal Anal Sphincter  0/5  External Anal Sphincter 0/5 Rt, 1/5 Lt   Puborectalis  1/5  Diastasis Recti    (Blank rows = not tested)         TONE: Low, decreased more on Rt than Lt   PROLAPSE: Grade 2 anterior vaginal wall laxity  08/20/22  PATIENT SURVEYS:  [id number]  COGNITION:            Overall cognitive status: Within functional limits for tasks assessed                          SENSATION:            Light touch: Deficits Rt flank down to toes has decreased sensation, decreased sensation at external genitalis             Proprioception: Deficits Rt side decreased    MUSCLE LENGTH: Rt hamstring and adductor limited by 50%, Lt 25%  GAIT: Comments: decreased mobility at Rt LE, foot drop Rt foot, decreased flexion of Rt knee, hip circumduction noted with gait at Rt, decreased cadence   POSTURE: rounded shoulders, forward head, and posterior pelvic tilt; Lt posterior/Rt anterior rotation; Slight Lt iliac crest elevation  FUNCTIONAL TASK: Lunge: bil instability present, pain  with Rt   LUMBARAROM/PROM   A/PROM A/PROM  Eval (% available)  Flexion WFL  Extension 50 with pain Rt  Right lateral flexion WNL  Left lateral flexion 50 limited by Rt side  Right rotation WNL  Left rotation 50 limtied by Rt side   (Blank rows = not tested)   LOWER EXTREMITY ROM:   WFL but tension felt pt pain at Rt side   LOWER EXTREMITY MMT:   MMT Right Eval All out of 5 Left Eval All out of 5  Hip flexion 2 4  Hip extension 3 4  Hip abduction 2+ 4  Hip adduction 3 4  Hip internal rotation  4 4  Hip external rotation  4 4  Knee flexion 3+ 4+  Knee extension 2+ 4+  Ankle dorsiflexion 0 4  Ankle plantarflexion 1+ 4  Ankle inversion 1+ 4  Ankle eversion 1+ 4    PALPATION: General: Pain throughout Rt lower quadrant, flank, low back; significant scar tissue restriction across low back/sacrum       TODAY'S TREATMENT  10/27/22 Manual: Thoracolumbar fascia release in supine and quadruped Neuromuscular re-education: Muscle energy techniques to reduce pelvic rotation (1 round 5 sec holds 3x, 2 round 10 sec holds 3x) Shot gun technique to stabilize pelvic 2 x 3 rounds Postural correction of anterior pelvic tilt/increased lumbar extension in standing by increasing abdominal activation - avoid gluteal gripping   10/22/22 Manual: Pt provides verbal consent for internal vaginal/rectal pelvic floor exam. Internal vaginal/rectal pelvic floor muscle assessment Deep pelvic floor muscle release Therapeutic activities: HEP folder review/modifications     10/07/22 Manual: Pt provides verbal consent for internal vaginal/rectal pelvic floor exam. Internal vaginal/rectal pelvic floor muscle assessment Deep pelvic floor muscle release Neuromuscular re-education: McConnel taping for pelvic stability (x pattern) Therapeutic activities: Education on serola belt and regular use  PATIENT EDUCATION:  Education details: See above Person educated: Patient Education method:  Programmer, multimedia, Demonstration, Tactile cues, Verbal cues, and Handouts Education comprehension: verbalized understanding and returned demonstration     HOME EXERCISE PROGRAM: [id number]   ASSESSMENT:   CLINICAL IMPRESSION: Pt presents with significant Lt anterior pelvic rotation/posterior Rt rotation today. No initial response to MET, but with longer isometric holds she was able to find some reduction. Thoracolumbar fascial release performed to help allow for correction of pelvic alignment to be more successful. Pt reported significant improvement in tightness, but still felt what she feels like is kidney pain referral wrapping around to Rt lower quadrant. Discussed performing MET at home to help with pelvic alignment. She will  continue to benefit from skilled PT intervention in order to decrease pain, begin/progress functional strengthening program, improve bowel movements, and improve quality of life.      OBJECTIVE IMPAIRMENTS decreased activity tolerance, decreased coordination, decreased endurance, decreased mobility, difficulty walking, decreased strength, increased fascial restrictions, increased muscle spasms, impaired flexibility, improper body mechanics, postural dysfunction, and pain.    ACTIVITY LIMITATIONS carrying, lifting, sitting, standing, squatting, stairs, continence, toileting, and locomotion level   PARTICIPATION LIMITATIONS: meal prep, cleaning, interpersonal relationship, driving, shopping, community activity, and yard work   PERSONAL FACTORS Past/current experiences, Time since onset of injury/illness/exacerbation, and 3+ comorbidities: medical history  are also affecting patient's functional outcome.    REHAB POTENTIAL: Good   CLINICAL DECISION MAKING: Evolving/moderate complexity   EVALUATION COMPLEXITY: Moderate     GOALS: Goals reviewed with patient? Yes   SHORT TERM GOALS: Target date: 09/24/22 - updated 10/07/22   Pt to be I with HEP.  Baseline: Goal status:  MET 10/07/22   2.  Pt will report 25% reduction of pain due to improvements in posture, strength, and muscle length  Baseline: pt currently 8-9/10 pain and does not feel like there has been long lasting improvement in pain Goal status: IN PROGRESS   3.  Pt will report her BMs and bladder voids are complete due to improved bowel habits and evacuation techniques.  Baseline: no change Goal status: IN PROGRESS     LONG TERM GOALS: Target date: 02/04/23 - updated 10/07/22    Pt to be I with advanced HEP.  Baseline:  Goal status: IN PROGRESS   2.  Pt will report 50% reduction of pain due to improvements in posture, strength, and muscle length  Baseline: no significant improvements at this time Goal status: IN PROGRESS   3.  Pt to be I with pelvic wand/dilator use for pain management outside of PT.  Baseline: has not returned to wand use at this time Goal status: IN PROGRESS   4.  Pt to be I with abdominal massage, voiding mechanics, breathing mechanics for improved pelvic relaxation and decreased pain.  Baseline: Pt has been working on relaxation techniques, but has not started bowel massage or been able to go over voiding mechanics yet Goal status: IN PROGRESS   5.  Pt to demonstrated improved coordination of pelvic floor and breathing mechanics with functional squat with no increase in pain. Baseline:  Goal status: IN PROGRESS   6.  Pt to demonstrate at least 3/5 pelvic floor strength and ability to fully relax post contraction for improved pelvic stability and decreased strain at pelvic floor. Baseline: improved to 1-2/5 Goal status: IN PROGRESS   PLAN: PT FREQUENCY: 1-2x/week   PT DURATION:  6 months   PLANNED INTERVENTIONS: Therapeutic exercises, Therapeutic activity, Neuromuscular re-education, Patient/Family education, Self Care, Joint mobilization, Aquatic Therapy, Dry Needling, Spinal mobilization, Cryotherapy, Moist heat, scar mobilization, Taping, Vasopneumatic device,  Biofeedback, and Manual therapy   PLAN FOR NEXT SESSION: Pelvic alignment assessment/treatment; begin progressing strengthening activities. Address goals.   Julio Alm, PT, DPT07/16/2411:43 AM

## 2022-10-28 NOTE — Progress Notes (Addendum)
[facility] Return Visit  SUBJECTIVE  History of Present Illness: NERIA PROCTER is a 47 y.o. female seen in follow-up for rUTI. Plan at last visit was start Fosfomycin 3g every 10 days and do her preventative measure.   She has had multiple positive culture tests done and they have been positive for different organisms. She is febrile today and her highest reported temp has been 100.3. She has been taking Tylenol to try and control this.   She repots she was feeling bad over the weekend but tried to push through and went on a trip to the forest and spent time in a [place]. She also reports she is in an apartment with minimal cooling measures and it has been over 90 degrees in the apartment which has not helped matters.   Past Medical History: Patient  has a past medical history of Abnormal Pap smear of cervix, Cervical cancer (HCC), Dysplastic nevus (02/16/2020), Dysplastic nevus (02/16/2020), Dysplastic nevus (02/16/2020), Dysplastic nevus (06/11/2020), Dysplastic nevus (06/11/2020), Dysplastic nevus (06/11/2020), Dysplastic nevus (06/11/2020), Dysplastic nevus (08/23/2020), Dysplastic nevus (08/12/2021), Dysplastic nevus (06/11/2012), History of recurrent UTIs, HPV (human papilloma virus) infection, HSV (herpes simplex virus) infection, 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[facility]), Melanoma (HCC) (02/16/2020), Normocytic anemia (08/21/2014), Tethered spinal cord (HCC), and Vitamin B 12 deficiency (08/21/2014).   Past Surgical History: She  has a past surgical history that includes Back surgery; Bladder surgery; Colostomy; Tubal ligation; removal of cervical cancer; colostomy reversal; Exploratory laparotomy; and LEEP.   Medications: She has a current medication list which includes the following prescription(s): cefuroxime, conjugated estrogens, vitamin b-12, epinephrine, estradiol, fluticasone, hydrocodone-acetaminophen, hydrocortisone, comfort protect adult diaper/m, loratadine,  methocarbamol, norethindrone, ondansetron, oxycodone, and promethazine.   Allergies: Patient is allergic to ciprofloxacin, macrobid [nitrofurantoin monohyd macro], morphine and codeine, sulfa antibiotics, azithromycin, dilaudid [hydromorphone hcl], lactose, prednisolone, prednisone, rosuvastatin, tramadol hcl, vitamin d analogs, lidocaine, and pork-derived products.   Social History: Patient  reports that she quit smoking about 11 years ago. Her smoking use included cigarettes. She has never used smokeless tobacco. She reports current alcohol use. She reports that she does not use drugs.      OBJECTIVE    POC: Clear of signs of infection  Physical Exam: Vitals:   10/28/22 1208  BP: 123/81  Pulse: 75   Gen: No apparent distress, A&O x 3.  Detailed Urogynecologic Evaluation:  Deferred.    ASSESSMENT AND PLAN    Ms. Goble is a 47 y.o. with:  1. Recurrent UTI   2. Fever in other diseases    Patient has had continued fevers with associated right side pain. She reports she does not believe it is appendicitis or anything else of concern as it is her normal region of pain with rUTI. I catheterized her today to get a sample which did not show obvious signs of infection, but she has been on Ceftin. Will send culture to rule out new or lingering infection.  Due to the fevers that have continued, will check lab work including a CBC and BMP to obtain white blood cell amounts as well as kidney function.   Will call patient with results.

## 2022-10-29 LAB — URINE CULTURE: Culture: NO GROWTH

## 2022-10-29 LAB — CBC
Hematocrit: 37.6 % (ref 34.0–46.6)
Hemoglobin: 13.1 g/dL (ref 11.1–15.9)
MCH: 31.7 pg (ref 26.6–33.0)
MCHC: 34.8 g/dL (ref 31.5–35.7)
MCV: 91 fL (ref 79–97)
Platelets: 192 10*3/uL (ref 150–450)
RBC: 4.13 x10E6/uL (ref 3.77–5.28)
RDW: 12.3 % (ref 11.7–15.4)
WBC: 4.9 10*3/uL (ref 3.4–10.8)

## 2022-10-29 LAB — BASIC METABOLIC PANEL
BUN/Creatinine Ratio: 10 (ref 9–23)
BUN: 7 mg/dL (ref 6–24)
CO2: 23 mmol/L (ref 20–29)
Calcium: 9 mg/dL (ref 8.7–10.2)
Chloride: 101 mmol/L (ref 96–106)
Creatinine, Ser: 0.67 mg/dL (ref 0.57–1.00)
Glucose: 89 mg/dL (ref 70–99)
Potassium: 4 mmol/L (ref 3.5–5.2)
Sodium: 140 mmol/L (ref 134–144)
eGFR: 107 mL/min/{1.73_m2} (ref >59)

## 2022-10-31 NOTE — Addendum Note (Signed)
Addended by: Selmer Dominion on: 10/31/2022 11:44 AM   Modules accepted: Orders

## 2022-11-04 ENCOUNTER — Ambulatory Visit: Payer: Medicaid Other

## 2022-11-04 DIAGNOSIS — M62838 Other muscle spasm: Secondary | ICD-10-CM

## 2022-11-04 DIAGNOSIS — R279 Unspecified lack of coordination: Secondary | ICD-10-CM

## 2022-11-04 DIAGNOSIS — R102 Pelvic and perineal pain: Secondary | ICD-10-CM

## 2022-11-04 DIAGNOSIS — M6281 Muscle weakness (generalized): Secondary | ICD-10-CM

## 2022-11-04 DIAGNOSIS — R293 Abnormal posture: Secondary | ICD-10-CM

## 2022-11-04 DIAGNOSIS — M5459 Other low back pain: Secondary | ICD-10-CM

## 2022-11-04 NOTE — Therapy (Signed)
OUTPATIENT PHYSICAL THERAPY FEMALE PELVIC TREATMENT   Patient Name: Melissa Pruitt MRN: [id number] DOB:[birth year], 47 y.o., female Today's Date: 11/04/2022  END OF SESSION:  PT End of Session - 11/04/22 1049     Visit Number 8    Date for PT Re-Evaluation 02/04/23    Authorization Type [employer]    Authorization Time Period 10/07/2022 -12/05/2022    Authorization - Visit Number 3    Authorization - Number of Visits 5    PT Start Time 1100    PT Stop Time 1140    PT Time Calculation (min) 40 min    Activity Tolerance Patient tolerated treatment well    Behavior During Therapy [facility] for tasks assessed/performed               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   R mid sole superior moderate atypia    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   R med sole inferior moderate atypia    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   R dorsum lat great toe   Dysplastic nevus 06/11/2020   left prox lat thigh sup, - severe   Dysplastic nevus 06/11/2020   left prox lat thigh inf, - moderate   Dysplastic nevus 06/11/2020   R mid to upper back 2.0cm lat to spine, Severe atypia   Dysplastic nevus 06/11/2020   R distal post thigh, moderate atypia   Dysplastic nevus 08/23/2020   Right labia - mild   Dysplastic nevus 08/12/2021   R lat neck post, moderate atypia   Dysplastic nevus 06/11/2012   L dorsal foot, mild atypia, bx from Dr. Larey Dresser   History of recurrent UTIs    HPV (human papilloma virus) infection    HSV (herpes simplex virus) infection    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[facility])    sacro/coccygeal mass excision   Melanoma (HCC) 02/16/2020   R mid plantar sole, BRESLOW'S DEPTH/MAXIMUM TUMOR THICKNESS: 0.5 MM, CLARK/ANATOMIC LEVEL: II Excised 03/20/2020   Normocytic anemia 08/21/2014   Tethered spinal cord (HCC)    Vitamin B 12 deficiency 08/21/2014   Past Surgical  History:  Procedure Laterality Date   BACK SURGERY     BLADDER SURGERY     COLOSTOMY     after flex sig as 47 year old.    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   Other specified disorders of uterus 10/12/2015   Herpes simplex 06/27/2015   Hyperlipidemia 05/30/2015   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   Normocytic anemia 08/21/2014   Vitamin B 12 deficiency 08/21/2014   Allergic rhinitis 01/19/2014   Condyloma acuminatum 01/09/2014   Chronic pain 08/09/2013   Cervical dysplasia 05/09/2013   Other headache syndrome 05/09/2013   Arthralgia of  temporomandibular joint 05/09/2013   Cervical pain 08/25/2012   Pain in joint, pelvic region and thigh 07/16/2012   Tethered spinal cord (HCC) 07/16/2012   Balance disorder 07/16/2012   Personal history of fall 07/16/2012   Neurogenic bladder 06/25/2011   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   OTHER SPECIFIED CONGENITAL ANOMALY SPINAL CORD 05/08/2010   UNSPEC CONGN ANOMALY BRAIN SP CORD&NERV SYSTEM 05/08/2010   H N P-LUMBAR 04/23/2010   BACK PAIN 01/28/2010   Papanicolaou smear of cervix with atypical squamous cells cannot exclude high grade squamous intraepithelial lesion (ASC-H) 04/14/1898    PCP: Orson Eva, NP  REFERRING PROVIDER: Lonell Face, MD  REFERRING DIAG: R10.2 Pelvic pain  THERAPY DIAG:  Pelvic pain  Abnormal  posture  Muscle weakness (generalized)  Unspecified lack of coordination  Other muscle spasm  Other low back pain  Rationale for Evaluation and Treatment: Rehabilitation  ONSET DATE: chronic  SUBJECTIVE:                                                                                                                                                                                            SUBJECTIVE STATEMENT: Pt states that she had urinalysis performed and it was clean, but she still has pain. She has CT scan scheduled this week to see if there is another issue going on in Rt side. She has been working on [employer], newspaper and feels like it is helpful.   PAIN:  Are you having pain? Yes NPRS scale: 6/10 Pain location: Rt ischial tuberosity with pain down lateral thigh to knee, Rt groin/anterior hip/abdomen, Rt low back, Rt side of tailbone   Pain type: aching, burning, dull, and stabbing  Pain description: constant (but level varies)   Aggravating factors: standing, sitting more than 30 mins Relieving factors: rest, muscle relaxer, taping, pelvic stability belt; popping, pillows for sleeping   PRECAUTIONS: Fall   WEIGHT BEARING RESTRICTIONS No   FALLS:  Has patient fallen in last 6 months? Yes. Number of falls 2   LIVING ENVIRONMENT: Lives with: lives with their family Lives in: House/apartment     OCCUPATION: disabled   PLOF: Independent   PATIENT GOALS to have less pain   PERTINENT HISTORY:  Myelomeningocele with surgical repair,  self-catheterizes for neurogenic bladder, Cervical cancer, HPV , recurrent UTIs, HSV , chronic rt hip pain, melanoma sle of Rt foot has been excised but has foot sensory changes, cervical radiculopathy, trigeminal neuralgia, grade 1 carpal tunnel, left mild chronic C7 radiculopathy   Sexual abuse: No   BOWEL MOVEMENT Pain with bowel movement: No Type of bowel movement:Type (Bristol Stool  Scale) type 1-2 usually, will have  diarrhea intermittently but thinks a lot of this is more about constipation and low motility, Frequency empty bowels every time caths, and usually this is enough unless has diarrhea, and Strain No Fully empty rectum: No does not have urge regularly, does have increased pressure in low back.   Leakage: Yes: with diarrhea wears AAT Pads: Yes: pull ups AAT Fiber supplement: No   URINATION Pain with urination: No Fully empty bladder: No does self cath to empty Stream: self-cath Urgency: No has no urge to have urine void Frequency: self caths 6x per day Leakage:  none Pads: No   INTERCOURSE Pain with intercourse:  not painful internally but will have pain at Rt pelvis or hip Ability to have vaginal penetration:  Yes:   Climax: yes not painful Marinoff Scale: 0/3   PREGNANCY Vaginal deliveries 3 Tearing Yes: tearing with first with stiches, tearing with second C-section deliveries 0 Currently pregnant No   PROLAPSE None       OBJECTIVE:   10/28/22 Significant Lt anterior pelvic rotation/posterior Rt rotation  10/07/22: LUMBAR AROM/PROM   A/PROM A/PROM  (% available)  Flexion WFL  Extension 75 with pain Rt  Right lateral flexion 75, pain with compression on Rt  Left lateral flexion 50 limited by Rt side  Right rotation WNL  Left rotation 75 limtied by Rt side   LOWER EXTREMITY MMT:   MMT Right Eval All out of 5 Left Eval All out of 5  Hip flexion 2 4  Hip extension 3 4  Hip abduction 2+ 4  Hip adduction 3 4  Hip internal rotation  4 4  Hip external rotation  4 4  Knee flexion 3+ 4+  Knee extension 2+ 4+  Ankle dorsiflexion 0 4  Ankle plantarflexion 1+ 4  Ankle inversion 1+ 4  Ankle eversion 1+ 4    External Perineal Exam: no light touch, can feel deep pressure over Rt labia               Internal Pelvic Floor :  internal vaginal assessment only performed; decrease sensation throughout Rt superficial/deep pelvic floor; does have sensation over Rt obturator  internus   Patient confirms identification and approves PT to assess internal pelvic floor and treatment Yes   PELVIC MMT:   MMT eval  Vaginal 1-2/5 with better coordination and less effort, equal bil  (Blank rows = not tested)  09/03/22 External Perineal Exam: no sensation Rt labia               Internal Pelvic Floor :  no sensation/decreased sensation superficial/deep pelvic floor - more sensation with palpation closer to coccyx vaginally, no sensation palpated on or lateral to coccyx rectally; significant hypermobility of coccyx; 100% pain reduction in low back/coccyx with posterior pressure on Rt coccyx; increase in low back pain with posterior pressure on Lt coccyx; spasm in Rt OI   Patient confirms identification and approves PT to assess internal pelvic floor and treatment Yes-next session    PELVIC MMT:   MMT eval  Vaginal 1/5 with significant effort   Internal Anal Sphincter  0/5  External Anal Sphincter 0/5 Rt, 1/5 Lt   Puborectalis  1/5  Diastasis Recti    (Blank rows = not tested)         TONE: Low, decreased more on Rt than Lt   PROLAPSE: Grade 2 anterior vaginal wall laxity  08/20/22  PATIENT SURVEYS:  [id number]  COGNITION:            Overall cognitive status: Within functional limits for tasks assessed                          SENSATION:            Light touch: Deficits Rt flank down to toes has decreased sensation, decreased sensation at external genitalis             Proprioception: Deficits Rt side decreased    MUSCLE LENGTH: Rt hamstring and adductor limited by 50%, Lt 25%  GAIT: Comments: decreased mobility at Rt LE, foot drop Rt foot, decreased flexion of Rt knee, hip circumduction noted with gait at Rt, decreased cadence   POSTURE: rounded shoulders, forward head, and posterior pelvic tilt; Lt posterior/Rt anterior rotation; Slight Lt iliac crest elevation  FUNCTIONAL TASK: Lunge: bil instability present, pain with Rt    LUMBARAROM/PROM   A/PROM A/PROM  Eval (% available)  Flexion WFL  Extension 50 with pain Rt  Right lateral flexion WNL  Left lateral flexion 50 limited by Rt side  Right rotation WNL  Left rotation 50 limtied by Rt side   (Blank rows = not tested)   LOWER EXTREMITY ROM:   WFL but tension felt pt pain at Rt side   LOWER EXTREMITY MMT:   MMT Right Eval All out of 5 Left Eval All out of 5  Hip flexion 2 4  Hip extension 3 4  Hip abduction 2+ 4  Hip adduction 3 4  Hip internal rotation  4 4  Hip external rotation  4 4  Knee flexion 3+ 4+  Knee extension 2+ 4+  Ankle dorsiflexion 0 4  Ankle plantarflexion 1+ 4  Ankle inversion 1+ 4  Ankle eversion 1+ 4    PALPATION: General: Pain throughout Rt lower quadrant, flank, low back; significant scar tissue restriction across low back/sacrum       TODAY'S TREATMENT  11/04/22 Exercises: Side lying hip adduction lift with UE ball press for core activation 2 x 10 Side lying abduction lift with UE ball press for core activation 2 x 10 Quadruped hip CAR 10x bil Quadruped hip rainbow 10x bil Therapeutic activities: MET review for pelvic alignment with written handout given   10/27/22 Manual: Thoracolumbar fascia release in supine and quadruped Neuromuscular re-education: Muscle energy techniques to reduce pelvic rotation (1 round 5 sec holds 3x, 2 round 10 sec holds 3x) Shot gun technique to stabilize pelvic 2 x 3 rounds Postural correction of anterior pelvic tilt/increased lumbar extension in standing by increasing abdominal activation - avoid gluteal gripping   10/22/22 Manual: Pt provides verbal consent for internal vaginal/rectal pelvic floor exam. Internal vaginal/rectal pelvic floor muscle assessment Deep pelvic floor muscle release Therapeutic activities: HEP folder review/modifications      PATIENT EDUCATION:  Education details: See above Person educated: Patient Education method: Programmer, multimedia,  Demonstration, Tactile cues, Verbal cues, and Handouts Education comprehension: verbalized understanding and returned demonstration     HOME EXERCISE PROGRAM: [id number]   ASSESSMENT:   CLINICAL IMPRESSION: Pt is doing better this week with lower pain levels, possibly indicating that pelvic alignment is improving and helping to control pain levels.She did very well with abduction/adduction progressions to help improve pelvic stability. She will continue to benefit from skilled PT intervention in order to decrease pain, begin/progress functional strengthening program, improve bowel movements, and improve quality of life.      OBJECTIVE  IMPAIRMENTS decreased activity tolerance, decreased coordination, decreased endurance, decreased mobility, difficulty walking, decreased strength, increased fascial restrictions, increased muscle spasms, impaired flexibility, improper body mechanics, postural dysfunction, and pain.    ACTIVITY LIMITATIONS carrying, lifting, sitting, standing, squatting, stairs, continence, toileting, and locomotion level   PARTICIPATION LIMITATIONS: meal prep, cleaning, interpersonal relationship, driving, shopping, community activity, and yard work   PERSONAL FACTORS Past/current experiences, Time since onset of injury/illness/exacerbation, and 3+ comorbidities: medical history  are also affecting patient's functional outcome.    REHAB POTENTIAL: Good   CLINICAL DECISION MAKING: Evolving/moderate complexity   EVALUATION COMPLEXITY: Moderate     GOALS: Goals reviewed with patient? Yes   SHORT TERM GOALS: Target date: 09/24/22 - updated 10/07/22 - 11/04/22   Pt to be I with HEP.  Baseline: Goal status: MET 10/07/22   2.  Pt will report 25% reduction of pain due to improvements in posture, strength, and muscle length  Baseline: pt currently 8-9/10 pain and does not feel like there has been long lasting improvement in pain Goal status: IN PROGRESS   3.  Pt will report  her BMs and bladder voids are complete due to improved bowel habits and evacuation techniques.  Baseline: no change Goal status: IN PROGRESS     LONG TERM GOALS: Target date: 02/04/23 - updated 10/07/22 - updated 11/04/22   Pt to be I with advanced HEP.  Baseline:  Goal status: IN PROGRESS   2.  Pt will report 50% reduction of pain due to improvements in posture, strength, and muscle length  Baseline: no significant improvements at this time Goal status: IN PROGRESS   3.  Pt to be I with pelvic wand/dilator use for pain management outside of PT.  Baseline: has not returned to wand use at this time Goal status: IN PROGRESS   4.  Pt to be I with abdominal massage, voiding mechanics, breathing mechanics for improved pelvic relaxation and decreased pain.  Baseline: Pt has been working on relaxation techniques, but has not started bowel massage or been able to go over voiding mechanics yet Goal status: IN PROGRESS   5.  Pt to demonstrated improved coordination of pelvic floor and breathing mechanics with functional squat with no increase in pain. Baseline:  Goal status: IN PROGRESS   6.  Pt to demonstrate at least 3/5 pelvic floor strength and ability to fully relax post contraction for improved pelvic stability and decreased strain at pelvic floor. Baseline: improved to 1-2/5 Goal status: IN PROGRESS   PLAN: PT FREQUENCY: 1-2x/week   PT DURATION:  6 months   PLANNED INTERVENTIONS: Therapeutic exercises, Therapeutic activity, Neuromuscular re-education, Patient/Family education, Self Care, Joint mobilization, Aquatic Therapy, Dry Needling, Spinal mobilization, Cryotherapy, Moist heat, scar mobilization, Taping, Vasopneumatic device, Biofeedback, and Manual therapy   PLAN FOR NEXT SESSION: Pelvic alignment assessment/treatment; begin progressing strengthening activities.  Julio Alm, PT, DPT07/23/2411:38 AM

## 2022-11-06 ENCOUNTER — Ambulatory Visit (INDEPENDENT_AMBULATORY_CARE_PROVIDER_SITE_OTHER): Payer: Medicaid Other

## 2022-11-06 ENCOUNTER — Ambulatory Visit (HOSPITAL_COMMUNITY)
Admission: RE | Admit: 2022-11-06 | Discharge: 2022-11-06 | Disposition: A | Discharge status: Home / Self Care | Payer: Medicaid Other | Source: Ambulatory Visit | Attending: Obstetrics and Gynecology | Admitting: Obstetrics and Gynecology

## 2022-11-06 DIAGNOSIS — R350 Frequency of micturition: Secondary | ICD-10-CM

## 2022-11-06 DIAGNOSIS — R1031 Right lower quadrant pain: Secondary | ICD-10-CM | POA: Insufficient documentation

## 2022-11-06 DIAGNOSIS — R82998 Other abnormal findings in urine: Secondary | ICD-10-CM

## 2022-11-06 LAB — POCT URINALYSIS DIPSTICK
Bilirubin, UA: NEGATIVE
Blood, UA: NEGATIVE
Glucose, UA: NEGATIVE
Ketones, UA: NEGATIVE
Nitrite, UA: POSITIVE
Protein, UA: NEGATIVE
Spec Grav, UA: 1.01 (ref 1.010–1.025)
Urobilinogen, UA: 0.2 E.U./dL
pH, UA: 6 (ref 5.0–8.0)

## 2022-11-06 MED ORDER — IOHEXOL 300 MG/ML  SOLN
100.0000 mL | Freq: Once | INTRAMUSCULAR | Status: AC | PRN
  Administered 2022-11-06: 100 mL via INTRAVENOUS

## 2022-11-06 NOTE — Progress Notes (Signed)
Melissa Pruitt is a 47 y.o. female arrived today with UTI sx.  Per Dr. Jari Favre protocol: A urine specimen was collected and POCT Urine was done and urine culture sent to the lab. POCT Urine was Positive  Pt was notified and prescription sent to the preferred pharmacy.

## 2022-11-06 NOTE — Patient Instructions (Signed)
Your Urine dip that was done in office was Positive. I am sending the urine off for culture and you can take AZO over the counter for your discomfort.  Take your Fosfomycin that was prescribed. If the culture comes back with a different antibiotic we will send it to your pharmacy. We will contact you when the results are back between 3-5 days. If a different antibiotic is needed we will sent the order to the pharmacy and you will be notified. If you have any questions or concerns please feel free to call us at [phone]

## 2022-11-10 ENCOUNTER — Telehealth: Payer: Self-pay | Admitting: Obstetrics and Gynecology

## 2022-11-10 MED ORDER — PROMETHAZINE HCL 12.5 MG PO TABS
12.5000 mg | ORAL_TABLET | Freq: Three times a day (TID) | ORAL | 0 refills | Status: DC | PRN
Start: 2022-11-10 — End: 2022-11-12

## 2022-11-10 NOTE — Addendum Note (Signed)
Addended by: Selmer Dominion on: 11/10/2022 12:54 PM   Modules accepted: Orders

## 2022-11-10 NOTE — Telephone Encounter (Signed)
Called and spoke to patient regarding her Culture results. She reports she took a dose of Fosfomycin on Thursday after giving a urine sample and has had more infectious symptoms since then. We discussed switching to a low-dose of Ceftin daily for prophylaxis and she is concerned about it causing gallbladder dysfunction if on a cephalosporin for too long. She cannot take Sulfa antibiotics or Macrobid and has had averse reactions to Fluoroquinolones as well. She is inquiring about getting IV antibiotics outpatient. We discussed that I do not normally order IV antibiotics and am not sure that is something we can do outpatient and would need to check with Dr. Florian Buff when she returns from leave. Also am awaiting imaging results from recent CT scan of Abdomen/Pelvis with contrast.

## 2022-11-11 ENCOUNTER — Ambulatory Visit: Payer: Medicaid Other

## 2022-11-11 DIAGNOSIS — R102 Pelvic and perineal pain: Secondary | ICD-10-CM

## 2022-11-11 DIAGNOSIS — R293 Abnormal posture: Secondary | ICD-10-CM

## 2022-11-11 DIAGNOSIS — R279 Unspecified lack of coordination: Secondary | ICD-10-CM

## 2022-11-11 DIAGNOSIS — M5459 Other low back pain: Secondary | ICD-10-CM

## 2022-11-11 DIAGNOSIS — M62838 Other muscle spasm: Secondary | ICD-10-CM

## 2022-11-11 DIAGNOSIS — M6281 Muscle weakness (generalized): Secondary | ICD-10-CM

## 2022-11-11 NOTE — Progress Notes (Signed)
Called and spoke to imaging center at [facility] about waiting for CT to be read. They report they will work on it as soon as possible.

## 2022-11-11 NOTE — Therapy (Signed)
OUTPATIENT PHYSICAL THERAPY FEMALE PELVIC TREATMENT   Patient Name: Melissa Pruitt MRN: [id number] DOB:1975/07/14, 47 y.o., female Today's Date: 11/11/2022  END OF SESSION:  PT End of Session - 11/11/22 1051     Visit Number 9    Date for PT Re-Evaluation 02/04/23    Authorization Type [employer]    Authorization Time Period 10/07/2022 -12/05/2022    Authorization - Visit Number 4    Authorization - Number of Visits 5    PT Start Time 1100    PT Stop Time 1145    PT Time Calculation (min) 45 min    Activity Tolerance Patient tolerated treatment well    Behavior During Therapy [facility] for tasks assessed/performed               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   R mid sole superior moderate atypia    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   R med sole inferior moderate atypia    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   R dorsum lat great toe   Dysplastic nevus 06/11/2020   left prox lat thigh sup, - severe   Dysplastic nevus 06/11/2020   left prox lat thigh inf, - moderate   Dysplastic nevus 06/11/2020   R mid to upper back 2.0cm lat to spine, Severe atypia   Dysplastic nevus 06/11/2020   R distal post thigh, moderate atypia   Dysplastic nevus 08/23/2020   Right labia - mild   Dysplastic nevus 08/12/2021   R lat neck post, moderate atypia   Dysplastic nevus 06/11/2012   L dorsal foot, mild atypia, bx from Dr. Larey Dresser   History of recurrent UTIs    HPV (human papilloma virus) infection    HSV (herpes simplex virus) infection    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[facility])    sacro/coccygeal mass excision   Melanoma (HCC) 02/16/2020   R mid plantar sole, BRESLOW'S DEPTH/MAXIMUM TUMOR THICKNESS: 0.5 MM, CLARK/ANATOMIC LEVEL: II Excised 03/20/2020   Normocytic anemia 08/21/2014   Tethered spinal cord (HCC)    Vitamin B 12 deficiency 08/21/2014   Past Surgical  History:  Procedure Laterality Date   BACK SURGERY     BLADDER SURGERY     COLOSTOMY     after flex sig as 47 year old.    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   Other specified disorders of uterus 10/12/2015   Herpes simplex 06/27/2015   Hyperlipidemia 05/30/2015   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   Normocytic anemia 08/21/2014   Vitamin B 12 deficiency 08/21/2014   Allergic rhinitis 01/19/2014   Condyloma acuminatum 01/09/2014   Chronic pain 08/09/2013   Cervical dysplasia 05/09/2013   Other headache syndrome 05/09/2013   Arthralgia of  temporomandibular joint 05/09/2013   Cervical pain 08/25/2012   Pain in joint, pelvic region and thigh 07/16/2012   Tethered spinal cord (HCC) 07/16/2012   Balance disorder 07/16/2012   Personal history of fall 07/16/2012   Neurogenic bladder 06/25/2011   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   OTHER SPECIFIED CONGENITAL ANOMALY SPINAL CORD 05/08/2010   UNSPEC CONGN ANOMALY BRAIN SP CORD&NERV SYSTEM 05/08/2010   H N P-LUMBAR 04/23/2010   BACK PAIN 01/28/2010   Papanicolaou smear of cervix with atypical squamous cells cannot exclude high grade squamous intraepithelial lesion (ASC-H) 04/14/1898    PCP: Orson Eva, NP  REFERRING PROVIDER: Lonell Face, MD  REFERRING DIAG: R10.2 Pelvic pain  THERAPY DIAG:  Pelvic pain  Abnormal  posture  Muscle weakness (generalized)  Unspecified lack of coordination  Other muscle spasm  Other low back pain  Rationale for Evaluation and Treatment: Rehabilitation  ONSET DATE: chronic  SUBJECTIVE:                                                                                                                                                                                            SUBJECTIVE STATEMENT: Pt's urine was positive for growth and she is supposed to wait for Dr. Florian Buff to return and come up with next steps. She was told to go to the ER for pain if necessary. She continues to have pain with urination, but also severe Rt upper quadrant pain that radiates into rib cage/back.  PAIN:  Are you having pain? Yes NPRS scale: 10/10 Pain location: Rt ischial tuberosity with pain down lateral thigh to knee, Rt groin/anterior hip/abdomen, Rt low back, Rt side of tailbone   Pain type: aching, burning, dull, and stabbing  Pain description: constant (but level varies)   Aggravating factors: standing, sitting more than 30 mins Relieving factors: rest, muscle relaxer, taping, pelvic stability belt; popping, pillows for sleeping   PRECAUTIONS: Fall   WEIGHT BEARING RESTRICTIONS No   FALLS:  Has patient fallen in last 6 months? Yes. Number of falls 2   LIVING ENVIRONMENT: Lives with: lives with their family Lives in: House/apartment     OCCUPATION: disabled   PLOF: Independent   PATIENT GOALS to have less pain   PERTINENT HISTORY:  Myelomeningocele with surgical repair,  self-catheterizes for neurogenic bladder, Cervical cancer, HPV , recurrent UTIs, HSV , chronic rt hip pain, melanoma sle of Rt foot has been excised but has foot sensory changes, cervical radiculopathy, trigeminal neuralgia, grade 1 carpal tunnel, left mild chronic C7 radiculopathy   Sexual abuse: No   BOWEL MOVEMENT Pain with bowel movement: No Type of  bowel movement:Type (Bristol Stool Scale)  type 1-2 usually, will have diarrhea intermittently but thinks a lot of this is more about constipation and low motility, Frequency empty bowels every time caths, and usually this is enough unless has diarrhea, and Strain No Fully empty rectum: No does not have urge regularly, does have increased pressure in low back.   Leakage: Yes: with diarrhea wears AAT Pads: Yes: pull ups AAT Fiber supplement: No   URINATION Pain with urination: No Fully empty bladder: No does self cath to empty Stream: self-cath Urgency: No has no urge to have urine void Frequency: self caths 6x per day Leakage:  none Pads: No   INTERCOURSE Pain with intercourse:  not painful internally but will have pain at Rt pelvis or hip Ability to have vaginal penetration:  Yes:   Climax: yes not painful Marinoff Scale: 0/3   PREGNANCY Vaginal deliveries 3 Tearing Yes: tearing with first with stiches, tearing with second C-section deliveries 0 Currently pregnant No   PROLAPSE None       OBJECTIVE:   10/28/22 Significant Lt anterior pelvic rotation/posterior Rt rotation  10/07/22: LUMBAR AROM/PROM   A/PROM A/PROM  (% available)  Flexion WFL  Extension 75 with pain Rt  Right lateral flexion 75, pain with compression on Rt  Left lateral flexion 50 limited by Rt side  Right rotation WNL  Left rotation 75 limtied by Rt side   LOWER EXTREMITY MMT:   MMT Right Eval All out of 5 Left Eval All out of 5  Hip flexion 2 4  Hip extension 3 4  Hip abduction 2+ 4  Hip adduction 3 4  Hip internal rotation  4 4  Hip external rotation  4 4  Knee flexion 3+ 4+  Knee extension 2+ 4+  Ankle dorsiflexion 0 4  Ankle plantarflexion 1+ 4  Ankle inversion 1+ 4  Ankle eversion 1+ 4    External Perineal Exam: no light touch, can feel deep pressure over Rt labia               Internal Pelvic Floor :  internal vaginal assessment only performed; decrease sensation throughout Rt superficial/deep pelvic floor; does have  sensation over Rt obturator internus   Patient confirms identification and approves PT to assess internal pelvic floor and treatment Yes   PELVIC MMT:   MMT eval  Vaginal 1-2/5 with better coordination and less effort, equal bil  (Blank rows = not tested)  09/03/22 External Perineal Exam: no sensation Rt labia               Internal Pelvic Floor :  no sensation/decreased sensation superficial/deep pelvic floor - more sensation with palpation closer to coccyx vaginally, no sensation palpated on or lateral to coccyx rectally; significant hypermobility of coccyx; 100% pain reduction in low back/coccyx with posterior pressure on Rt coccyx; increase in low back pain with posterior pressure on Lt coccyx; spasm in Rt OI   Patient confirms identification and approves PT to assess internal pelvic floor and treatment Yes-next session    PELVIC MMT:   MMT eval  Vaginal 1/5 with significant effort   Internal Anal Sphincter  0/5  External Anal Sphincter 0/5 Rt, 1/5 Lt   Puborectalis  1/5  Diastasis Recti    (Blank rows = not tested)         TONE: Low, decreased more on Rt than Lt   PROLAPSE: Grade 2 anterior vaginal wall laxity  08/20/22  PATIENT SURVEYS:  [id number]  133     COGNITION:            Overall cognitive status: Within functional limits for tasks assessed                          SENSATION:            Light touch: Deficits Rt flank down to toes has decreased sensation, decreased sensation at external genitalis             Proprioception: Deficits Rt side decreased    MUSCLE LENGTH: Rt hamstring and adductor limited by 50%, Lt 25%  GAIT: Comments: decreased mobility at Rt LE, foot drop Rt foot, decreased flexion of Rt knee, hip circumduction noted with gait at Rt, decreased cadence   POSTURE: rounded shoulders, forward head, and posterior pelvic tilt; Lt posterior/Rt anterior rotation; Slight Lt iliac crest elevation  FUNCTIONAL TASK: Lunge: bil instability present, pain  with Rt   LUMBARAROM/PROM   A/PROM A/PROM  Eval (% available)  Flexion WFL  Extension 50 with pain Rt  Right lateral flexion WNL  Left lateral flexion 50 limited by Rt side  Right rotation WNL  Left rotation 50 limtied by Rt side   (Blank rows = not tested)   LOWER EXTREMITY ROM:   WFL but tension felt pt pain at Rt side   LOWER EXTREMITY MMT:   MMT Right Eval All out of 5 Left Eval All out of 5  Hip flexion 2 4  Hip extension 3 4  Hip abduction 2+ 4  Hip adduction 3 4  Hip internal rotation  4 4  Hip external rotation  4 4  Knee flexion 3+ 4+  Knee extension 2+ 4+  Ankle dorsiflexion 0 4  Ankle plantarflexion 1+ 4  Ankle inversion 1+ 4  Ankle eversion 1+ 4    PALPATION: General: Pain throughout Rt lower quadrant, flank, low back; significant scar tissue restriction across low back/sacrum       TODAY'S TREATMENT  11/11/22 Manual: Trigger Point Dry-Needling  Treatment instructions: Expect mild to moderate muscle soreness. S/S of pneumothorax if dry needled over a lung field, and to seek immediate medical attention should they occur. Patient verbalized understanding of these instructions and education.  Patient Consent Given: Yes Education handout provided: Previously provided Muscles treated: Rt thoracic paraspinals Electrical stimulation performed: No Parameters: N/A Treatment response/outcome: twitch response/release Soft tissue mobilization to thoracic paraspinals and trigger points over Rt rib cage Negative pressure soft tissue mobilization over Rt upper quadrant Diaphragm ischemic release  Exercises: Cat cow 2 x 10 Thread the needle 2 min bil Serratus punch 5x   11/04/22 Exercises: Side lying hip adduction lift with UE ball press for core activation 2 x 10 Side lying abduction lift with UE ball press for core activation 2 x 10 Quadruped hip CAR 10x bil Quadruped hip rainbow 10x bil Therapeutic activities: MET review for pelvic alignment with  written handout given   10/27/22 Manual: Thoracolumbar fascia release in supine and quadruped Neuromuscular re-education: Muscle energy techniques to reduce pelvic rotation (1 round 5 sec holds 3x, 2 round 10 sec holds 3x) Shot gun technique to stabilize pelvic 2 x 3 rounds Postural correction of anterior pelvic tilt/increased lumbar extension in standing by increasing abdominal activation - avoid gluteal gripping     PATIENT EDUCATION:  Education details: See above Person educated: Patient Education method: Explanation, Demonstration, Tactile cues, Verbal cues, and Handouts Education comprehension: verbalized understanding and  returned demonstration     HOME EXERCISE PROGRAM: [id number]   ASSESSMENT:   CLINICAL IMPRESSION: Pt in significant pain this session in Rt side. Even though she feels like pain is deeper rather than superficial/muscular, we attempted addressing soft tissue restriction present in lumbar paraspinals. She had some reduction in pain after manual techniques and mobility to thoracic spine from 10/10 to 7-10 pain. She will continue to benefit from skilled PT intervention in order to decrease pain, begin/progress functional strengthening program, improve bowel movements, and improve quality of life.      OBJECTIVE IMPAIRMENTS decreased activity tolerance, decreased coordination, decreased endurance, decreased mobility, difficulty walking, decreased strength, increased fascial restrictions, increased muscle spasms, impaired flexibility, improper body mechanics, postural dysfunction, and pain.    ACTIVITY LIMITATIONS carrying, lifting, sitting, standing, squatting, stairs, continence, toileting, and locomotion level   PARTICIPATION LIMITATIONS: meal prep, cleaning, interpersonal relationship, driving, shopping, community activity, and yard work   PERSONAL FACTORS Past/current experiences, Time since onset of injury/illness/exacerbation, and 3+ comorbidities: medical  history  are also affecting patient's functional outcome.    REHAB POTENTIAL: Good   CLINICAL DECISION MAKING: Evolving/moderate complexity   EVALUATION COMPLEXITY: Moderate     GOALS: Goals reviewed with patient? Yes   SHORT TERM GOALS: Target date: 09/24/22 - updated 10/07/22 - 11/04/22   Pt to be I with HEP.  Baseline: Goal status: MET 10/07/22   2.  Pt will report 25% reduction of pain due to improvements in posture, strength, and muscle length  Baseline: pt currently 8-9/10 pain and does not feel like there has been long lasting improvement in pain Goal status: IN PROGRESS   3.  Pt will report her BMs and bladder voids are complete due to improved bowel habits and evacuation techniques.  Baseline: no change Goal status: IN PROGRESS     LONG TERM GOALS: Target date: 02/04/23 - updated 10/07/22 - updated 11/04/22   Pt to be I with advanced HEP.  Baseline:  Goal status: IN PROGRESS   2.  Pt will report 50% reduction of pain due to improvements in posture, strength, and muscle length  Baseline: no significant improvements at this time Goal status: IN PROGRESS   3.  Pt to be I with pelvic wand/dilator use for pain management outside of PT.  Baseline: has not returned to wand use at this time Goal status: IN PROGRESS   4.  Pt to be I with abdominal massage, voiding mechanics, breathing mechanics for improved pelvic relaxation and decreased pain.  Baseline: Pt has been working on relaxation techniques, but has not started bowel massage or been able to go over voiding mechanics yet Goal status: IN PROGRESS   5.  Pt to demonstrated improved coordination of pelvic floor and breathing mechanics with functional squat with no increase in pain. Baseline:  Goal status: IN PROGRESS   6.  Pt to demonstrate at least 3/5 pelvic floor strength and ability to fully relax post contraction for improved pelvic stability and decreased strain at pelvic floor. Baseline: improved to 1-2/5 Goal  status: IN PROGRESS   PLAN: PT FREQUENCY: 1-2x/week   PT DURATION:  6 months   PLANNED INTERVENTIONS: Therapeutic exercises, Therapeutic activity, Neuromuscular re-education, Patient/Family education, Self Care, Joint mobilization, Aquatic Therapy, Dry Needling, Spinal mobilization, Cryotherapy, Moist heat, scar mobilization, Taping, Vasopneumatic device, Biofeedback, and Manual therapy   PLAN FOR NEXT SESSION: Manual techniques as needed; progress strengthening/mobility  Julio Alm, PT, DPT07/30/2411:45 AM

## 2022-11-12 MED ORDER — LEVOFLOXACIN 500 MG PO TABS
500.0000 mg | ORAL_TABLET | Freq: Every day | ORAL | 0 refills | Status: AC
Start: 2022-11-12 — End: 2022-11-22

## 2022-11-12 MED ORDER — PROMETHAZINE HCL 12.5 MG PO TABS
12.5000 mg | ORAL_TABLET | Freq: Three times a day (TID) | ORAL | 2 refills | Status: DC | PRN
Start: 2022-11-12 — End: 2024-02-15

## 2022-11-12 NOTE — Addendum Note (Signed)
Addended by: Selmer Dominion on: 11/12/2022 11:58 AM   Modules accepted: Orders

## 2022-11-14 ENCOUNTER — Ambulatory Visit: Payer: Medicaid Other | Admitting: Obstetrics and Gynecology

## 2022-11-18 ENCOUNTER — Ambulatory Visit: Payer: Medicaid Other | Attending: Neurology

## 2022-11-18 DIAGNOSIS — R293 Abnormal posture: Secondary | ICD-10-CM

## 2022-11-18 DIAGNOSIS — M5459 Other low back pain: Secondary | ICD-10-CM | POA: Diagnosis present

## 2022-11-18 DIAGNOSIS — R102 Pelvic and perineal pain: Secondary | ICD-10-CM

## 2022-11-18 DIAGNOSIS — R279 Unspecified lack of coordination: Secondary | ICD-10-CM

## 2022-11-18 DIAGNOSIS — M6281 Muscle weakness (generalized): Secondary | ICD-10-CM | POA: Diagnosis present

## 2022-11-18 DIAGNOSIS — M62838 Other muscle spasm: Secondary | ICD-10-CM | POA: Diagnosis present

## 2022-11-18 NOTE — Therapy (Signed)
OUTPATIENT PHYSICAL THERAPY FEMALE PELVIC TREATMENT   Patient Name: Melissa Pruitt MRN: [id number] DOB:1975-10-12, 47 y.o., female Today's Date: 11/18/2022  END OF SESSION:  PT End of Session - 11/18/22 1103     Visit Number 10    Date for PT Re-Evaluation 02/04/23    Authorization Type [employer]    Authorization Time Period 10/07/2022 -12/05/2022    Authorization - Visit Number 5    Authorization - Number of Visits 5    PT Start Time 1100    PT Stop Time 1140    PT Time Calculation (min) 40 min    Activity Tolerance Patient tolerated treatment well    Behavior During Therapy [facility] for tasks assessed/performed                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   R mid sole superior moderate atypia    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   R med sole inferior moderate atypia    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   R dorsum lat great toe   Dysplastic nevus 06/11/2020   left prox lat thigh sup, - severe   Dysplastic nevus 06/11/2020   left prox lat thigh inf, - moderate   Dysplastic nevus 06/11/2020   R mid to upper back 2.0cm lat to spine, Severe atypia   Dysplastic nevus 06/11/2020   R distal post thigh, moderate atypia   Dysplastic nevus 08/23/2020   Right labia - mild   Dysplastic nevus 08/12/2021   R lat neck post, moderate atypia   Dysplastic nevus 06/11/2012   L dorsal foot, mild atypia, bx from Dr. Larey Dresser   History of recurrent UTIs    HPV (human papilloma virus) infection    HSV (herpes simplex virus) infection    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[facility])    sacro/coccygeal mass excision   Melanoma (HCC) 02/16/2020   R mid plantar sole, BRESLOW'S DEPTH/MAXIMUM TUMOR THICKNESS: 0.5 MM, CLARK/ANATOMIC LEVEL: II Excised 03/20/2020   Normocytic anemia 08/21/2014   Tethered spinal cord (HCC)    Vitamin B 12 deficiency 08/21/2014   Past Surgical  History:  Procedure Laterality Date   BACK SURGERY     BLADDER SURGERY     COLOSTOMY     after flex sig as 47 year old.    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   Other specified disorders of uterus 10/12/2015   Herpes simplex 06/27/2015   Hyperlipidemia 05/30/2015   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   Normocytic anemia 08/21/2014   Vitamin B 12 deficiency 08/21/2014   Allergic rhinitis 01/19/2014   Condyloma acuminatum 01/09/2014   Chronic pain 08/09/2013   Cervical dysplasia 05/09/2013   Other headache syndrome 05/09/2013   Arthralgia  of temporomandibular joint 05/09/2013   Cervical pain 08/25/2012   Pain in joint, pelvic region and thigh 07/16/2012   Tethered spinal cord (HCC) 07/16/2012   Balance disorder 07/16/2012   Personal history of fall 07/16/2012   Neurogenic bladder 06/25/2011   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   OTHER SPECIFIED CONGENITAL ANOMALY SPINAL CORD 05/08/2010   UNSPEC CONGN ANOMALY BRAIN SP CORD&NERV SYSTEM 05/08/2010   H N P-LUMBAR 04/23/2010   BACK PAIN 01/28/2010   Papanicolaou smear of cervix with atypical squamous cells cannot exclude high grade squamous intraepithelial lesion (ASC-H) 04/14/1898    PCP: Orson Eva, NP  REFERRING PROVIDER: Lonell Face, MD  REFERRING DIAG: R10.2 Pelvic pain  THERAPY DIAG:  Pelvic pain  Abnormal  posture  Muscle weakness (generalized)  Unspecified lack of coordination  Other muscle spasm  Other low back pain  Rationale for Evaluation and Treatment: Rehabilitation  ONSET DATE: chronic  SUBJECTIVE:                                                                                                                                                                                            SUBJECTIVE STATEMENT: Pt is working with dilator again to help decrease pain. She is having significant difficulty with pain this week. She is unsure what has pain aggravated. She states that this pain has been better the last couple of weeks and her Rt flank pain was worse, but now her Rt flank pain is gone and her pelvic pain is flared up. She states that manual techniques very helpful at addressing Rt flank pain last session. She has gotten squatty potty in order to see if this is helpful with bowel movements.   PAIN:  Are you having pain? Yes NPRS scale: 8-9/10 Pain location: Rt ischial tuberosity with pain down lateral thigh to knee, Rt groin/anterior hip/abdomen, Rt low back, Rt side of tailbone   Pain type: aching, burning, dull, and stabbing  Pain description: constant (but level varies)   Aggravating factors: standing, sitting more than 30 mins Relieving factors: rest, muscle relaxer, taping, pelvic stability belt; popping, pillows for sleeping   PRECAUTIONS: Fall   WEIGHT BEARING RESTRICTIONS No   FALLS:  Has patient fallen in last 6 months? Yes. Number of falls 2   LIVING ENVIRONMENT: Lives with: lives with their family Lives in: House/apartment     OCCUPATION: disabled   PLOF: Independent   PATIENT GOALS to have less pain   PERTINENT HISTORY:  Myelomeningocele with surgical repair,  self-catheterizes for neurogenic bladder, Cervical cancer, HPV , recurrent UTIs, HSV , chronic rt hip pain, melanoma sle of  Rt foot has been excised but has foot sensory changes, cervical  radiculopathy, trigeminal neuralgia, grade 1 carpal tunnel, left mild chronic C7 radiculopathy   Sexual abuse: No   BOWEL MOVEMENT Pain with bowel movement: No Type of bowel movement:Type (Bristol Stool Scale) type 1-2 usually, will have diarrhea intermittently but thinks a lot of this is more about constipation and low motility, Frequency empty bowels every time caths, and usually this is enough unless has diarrhea, and Strain No Fully empty rectum: No does not have urge regularly, does have increased pressure in low back.   Leakage: Yes: with diarrhea wears AAT Pads: Yes: pull ups AAT Fiber supplement: No   URINATION Pain with urination: No Fully empty bladder: No does self cath to empty Stream: self-cath Urgency: No has no urge to have urine void Frequency: self caths 6x per day Leakage:  none Pads: No   INTERCOURSE Pain with intercourse:  not painful internally but will have pain at Rt pelvis or hip Ability to have vaginal penetration:  Yes:   Climax: yes not painful Marinoff Scale: 0/3   PREGNANCY Vaginal deliveries 3 Tearing Yes: tearing with first with stiches, tearing with second C-section deliveries 0 Currently pregnant No   PROLAPSE None       OBJECTIVE:   11/18/22 Significant Lt anterior pelvic rotation/posterior Rt rotation still present  PFIQ-7: 77  MMT eval  Vaginal 1-2/5 with coordination still improved and less effort, equal bil  (Blank rows = not tested)  PALPATION: Presence of cyst in vaginal opening (pt reports 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); tenderness throughout Rt pelvic floor with pai nreproduction   10/07/22: LUMBAR AROM/PROM   A/PROM A/PROM  (% available)  Flexion WFL  Extension 75 with pain Rt  Right lateral flexion 75, pain with compression on Rt  Left lateral flexion 50 limited by Rt side  Right rotation WNL  Left rotation 75 limtied by Rt side   LOWER EXTREMITY MMT:   MMT Right Eval All out of 5 Left Eval All out of 5   Hip flexion 2 4  Hip extension 3 4  Hip abduction 2+ 4  Hip adduction 3 4  Hip internal rotation  4 4  Hip external rotation  4 4  Knee flexion 3+ 4+  Knee extension 2+ 4+  Ankle dorsiflexion 0 4  Ankle plantarflexion 1+ 4  Ankle inversion 1+ 4  Ankle eversion 1+ 4    External Perineal Exam: no light touch, can feel deep pressure over Rt labia               Internal Pelvic Floor :  internal vaginal assessment only performed; decrease sensation throughout Rt superficial/deep pelvic floor; does have sensation over Rt obturator internus   Patient confirms identification and approves PT to assess internal pelvic floor and treatment Yes   PELVIC MMT:   MMT eval  Vaginal 1-2/5 with better coordination and less effort, equal bil  (Blank rows = not tested)  09/03/22 External Perineal Exam: no sensation Rt labia               Internal Pelvic Floor :  no sensation/decreased sensation superficial/deep pelvic floor - more sensation with palpation closer to coccyx vaginally, no sensation palpated on or lateral to coccyx rectally; significant hypermobility of coccyx; 100% pain reduction in low back/coccyx with posterior pressure on Rt coccyx; increase in low back pain with posterior pressure on Lt coccyx; spasm in Rt OI   Patient confirms  identification and approves PT to assess internal pelvic floor and treatment Yes-next session    PELVIC MMT:   MMT eval  Vaginal 1/5 with significant effort   Internal Anal Sphincter  0/5  External Anal Sphincter 0/5 Rt, 1/5 Lt   Puborectalis  1/5  Diastasis Recti    (Blank rows = not tested)         TONE: Low, decreased more on Rt than Lt   PROLAPSE: Grade 2 anterior vaginal wall laxity  08/20/22  PATIENT SURVEYS:  PFIQ-7 133     COGNITION:            Overall cognitive status: Within functional limits for tasks assessed                          SENSATION:            Light touch: Deficits Rt flank down to toes has decreased sensation,  decreased sensation at external genitalis             Proprioception: Deficits Rt side decreased    MUSCLE LENGTH: Rt hamstring and adductor limited by 50%, Lt 25%  GAIT: Comments: decreased mobility at Rt LE, foot drop Rt foot, decreased flexion of Rt knee, hip circumduction noted with gait at Rt, decreased cadence   POSTURE: rounded shoulders, forward head, and posterior pelvic tilt; Lt posterior/Rt anterior rotation; Slight Lt iliac crest elevation  FUNCTIONAL TASK: Lunge: bil instability present, pain with Rt   LUMBARAROM/PROM   A/PROM A/PROM  Eval (% available)  Flexion WFL  Extension 50 with pain Rt  Right lateral flexion WNL  Left lateral flexion 50 limited by Rt side  Right rotation WNL  Left rotation 50 limtied by Rt side   (Blank rows = not tested)   LOWER EXTREMITY ROM:   WFL but tension felt pt pain at Rt side   LOWER EXTREMITY MMT:   MMT Right Eval All out of 5 Left Eval All out of 5  Hip flexion 2 4  Hip extension 3 4  Hip abduction 2+ 4  Hip adduction 3 4  Hip internal rotation  4 4  Hip external rotation  4 4  Knee flexion 3+ 4+  Knee extension 2+ 4+  Ankle dorsiflexion 0 4  Ankle plantarflexion 1+ 4  Ankle inversion 1+ 4  Ankle eversion 1+ 4    PALPATION: General: Pain throughout Rt lower quadrant, flank, low back; significant scar tissue restriction across low back/sacrum       TODAY'S TREATMENT  11/18/22 Manual: Pt provides verbal consent for internal vaginal/rectal pelvic floor exam. Vaginal pelvic floor muscle reassessment  Rt superficial/deep pelvic floor muscle release   11/11/22 Manual: Trigger Point Dry-Needling  Treatment instructions: Expect mild to moderate muscle soreness. S/S of pneumothorax if dry needled over a lung field, and to seek immediate medical attention should they occur. Patient verbalized understanding of these instructions and education.  Patient Consent Given: Yes Education handout provided: Previously  provided Muscles treated: Rt thoracic paraspinals Electrical stimulation performed: No Parameters: N/A Treatment response/outcome: twitch response/release Soft tissue mobilization to thoracic paraspinals and trigger points over Rt rib cage Negative pressure soft tissue mobilization over Rt upper quadrant Diaphragm ischemic release  Exercises: Cat cow 2 x 10 Thread the needle 2 min bil Serratus punch 5x   11/04/22 Exercises: Side lying hip adduction lift with UE ball press for core activation 2 x 10 Side lying abduction lift with UE ball  press for core activation 2 x 10 Quadruped hip CAR 10x bil Quadruped hip rainbow 10x bil Therapeutic activities: MET review for pelvic alignment with written handout given   PATIENT EDUCATION:  Education details: See above Person educated: Patient Education method: Explanation, Demonstration, Tactile cues, Verbal cues, and Handouts Education comprehension: verbalized understanding and returned demonstration     HOME EXERCISE PROGRAM: [id number]   ASSESSMENT:   CLINICAL IMPRESSION: Pt is having pelvic pain aggravation this session, but her Rt flank pain is notably better after last treatment session. This may indicate that there was a lot of postural and muscular contribution to Rt flank pain and that it may not be all due to underlying infection. She is seeing improvement in PFIQ-7 score, indicating improved functional ability, but her specific score with frustration has gone up from 1 to 3. Pt had 0/10 lower pelvic pain that she walked in with at end of session, demonstrated that this pain is coming from pelvic floor restriction. We discussed that pelvic floor muscle wand may be a better option for release compared to dilator in order to wrap around bony structure of pelvis. She continues to demonstrate improved pelvic floor contraction that feels equal bil, but no strength gains from last assessment; question how much more strength she will be able  to achieve in this area with neurological deficit. We will begin to work on bowel movement voiding mechanics more now that patient has squatty potty and will have tool to more regularly work on pelvic floor release. Believe HEP is still very helpful with pain control and working on pelvic stability. Pelvic rotation present each treatment session, but working on asymmetrical stability exercises continues to help with pain management. She will continue to benefit from skilled PT intervention in order to decrease pain, begin/progress functional strengthening program, improve bowel movements, and improve quality of life.      OBJECTIVE IMPAIRMENTS decreased activity tolerance, decreased coordination, decreased endurance, decreased mobility, difficulty walking, decreased strength, increased fascial restrictions, increased muscle spasms, impaired flexibility, improper body mechanics, postural dysfunction, and pain.    ACTIVITY LIMITATIONS carrying, lifting, sitting, standing, squatting, stairs, continence, toileting, and locomotion level   PARTICIPATION LIMITATIONS: meal prep, cleaning, interpersonal relationship, driving, shopping, community activity, and yard work   PERSONAL FACTORS Past/current experiences, Time since onset of injury/illness/exacerbation, and 3+ comorbidities: medical history  are also affecting patient's functional outcome.    REHAB POTENTIAL: Good   CLINICAL DECISION MAKING: Evolving/moderate complexity   EVALUATION COMPLEXITY: Moderate     GOALS: Goals reviewed with patient? Yes   SHORT TERM GOALS: Target date: 09/24/22 - updated 10/07/22 - 11/04/22 - updated 11/18/22   Pt to be I with HEP.  Baseline: Goal status: MET 10/07/22   2.  Pt will report 25% reduction of pain due to improvements in posture, strength, and muscle length  Baseline: She reports that on average she feels better 25% of the time, but there are still days in which pain is very high, like today Goal status:  MET 11/18/22   3.  Pt will report her BMs and bladder voids are complete due to improved bowel habits and evacuation techniques.  Baseline: She states that she is now able to completely empty bladder, but bowel is not emptying completely even with splinting techniques Goal status: IN PROGRESS     LONG TERM GOALS: Target date: 02/04/23 - updated 10/07/22 - updated 11/04/22 - updated 11/18/22   Pt to be I with advanced HEP.  Baseline:  Goal  status: IN PROGRESS   2.  Pt will report 50% reduction of pain due to improvements in posture, strength, and muscle length  Baseline:  Goal status: IN PROGRESS   3.  Pt to be I with pelvic wand/dilator use for pain management outside of PT.  Baseline: Pt working with dilators for pain reduction - believe she needs pelvic wand Goal status: MET 11/18/22   4.  Pt to be I with abdominal massage, voiding mechanics, breathing mechanics for improved pelvic relaxation and decreased pain.  Baseline:  Goal status: IN PROGRESS   5.  Pt to demonstrated improved coordination of pelvic floor and breathing mechanics with functional squat with no increase in pain. Baseline:  Goal status: IN PROGRESS   6.  Pt to demonstrate at least 3/5 pelvic floor strength and ability to fully relax post contraction for improved pelvic stability and decreased strain at pelvic floor. Baseline: improved to 1-2/5 Goal status: IN PROGRESS   PLAN: PT FREQUENCY: 1-2x/week   PT DURATION:  6 months   PLANNED INTERVENTIONS: Therapeutic exercises, Therapeutic activity, Neuromuscular re-education, Patient/Family education, Self Care, Joint mobilization, Aquatic Therapy, Dry Needling, Spinal mobilization, Cryotherapy, Moist heat, scar mobilization, Taping, Vasopneumatic device, Biofeedback, and Manual therapy   PLAN FOR NEXT SESSION: Manual techniques as needed; progress strengthening/mobility; voiding mechanics for bowel  Julio Alm, PT, DPT08/09/2409:48 AM

## 2022-11-19 ENCOUNTER — Ambulatory Visit: Payer: Medicaid Other | Admitting: Obstetrics and Gynecology

## 2022-11-19 ENCOUNTER — Encounter: Payer: Self-pay | Admitting: Obstetrics and Gynecology

## 2022-11-19 VITALS — BP 117/72 | HR 72

## 2022-11-19 DIAGNOSIS — N319 Neuromuscular dysfunction of bladder, unspecified: Secondary | ICD-10-CM | POA: Diagnosis not present

## 2022-11-19 DIAGNOSIS — N898 Other specified noninflammatory disorders of vagina: Secondary | ICD-10-CM | POA: Diagnosis not present

## 2022-11-19 DIAGNOSIS — N390 Urinary tract infection, site not specified: Secondary | ICD-10-CM

## 2022-11-19 NOTE — Progress Notes (Signed)
[facility] Return Visit  SUBJECTIVE  History of Present Illness: Melissa Pruitt is a 47 y.o. female seen in follow-up for neurogenic bladder, recurrent UTI and vaginal cyst.   Has had recurrent urinary tract infections recently since Feb.   Had a partner earlier this year that she feels may have been causing the fecal contamination during intercourse with oral sex. This started several urinary tract infections.   She was placed on fosfomycin prophylaxis but was having recurrent symptoms in between. She has been on the levaquin for recent klebsiella infection. Has had some nausea but has tolerated it well.   She self-catheterizes 6 times per day. Sometimes position changes how well she empties but she feels like she empties well overall with the catheter.   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   History of myelomeningocele and duplicated right collecting system and self-catheterizes for neurogenic bladder.  Past Medical History: Patient  has a past medical history of Abnormal Pap smear of cervix, Cervical cancer (HCC), Dysplastic nevus (02/16/2020), Dysplastic nevus (02/16/2020), Dysplastic nevus (02/16/2020), Dysplastic nevus (06/11/2020), Dysplastic nevus (06/11/2020), Dysplastic nevus (06/11/2020), Dysplastic nevus (06/11/2020), Dysplastic nevus (08/23/2020), Dysplastic nevus (08/12/2021), Dysplastic nevus (06/11/2012), History of recurrent UTIs, HPV (human papilloma virus) infection, HSV (herpes simplex virus) infection, 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[facility]), Melanoma (HCC) (02/16/2020), Normocytic anemia (08/21/2014), Tethered spinal cord (HCC), and Vitamin B 12 deficiency (08/21/2014).   Past Surgical History: She  has a past surgical history that includes Back surgery; Bladder surgery; Colostomy; Tubal ligation; removal of cervical cancer; colostomy reversal; Exploratory laparotomy; and LEEP.   Medications: She has a current  medication list which includes the following prescription(s): conjugated estrogens, vitamin b-12, epinephrine, estradiol, fluticasone, hydrocodone-acetaminophen, hydrocortisone, comfort protect adult diaper/m, levofloxacin, loratadine, methocarbamol, norethindrone, oxycodone, and promethazine.   Allergies: Patient is allergic to ciprofloxacin, macrobid [nitrofurantoin monohyd macro], morphine and codeine, sulfa antibiotics, azithromycin, dilaudid [hydromorphone hcl], lactose, prednisolone, prednisone, rosuvastatin, tramadol hcl, vitamin d analogs, lidocaine, and pork-derived products.   Social History: Patient  reports that she quit smoking about 11 years ago. Her smoking use included cigarettes. She has never used smokeless tobacco. She reports current alcohol use. She reports that she does not use drugs.      OBJECTIVE     Physical Exam:  Gen: No apparent distress, A&O x 3.     ASSESSMENT AND PLAN    Ms. Ballas is a 47 y.o. with:  1. Recurrent UTI   2. Neurogenic bladder   3. Vaginal mass      Neurogenic bladder - Continue self-catheterization   AUA NLUTD surveillance- Moderate risk due to DO with incomplete emptying on UDS: - annual renal function assessment: BUN and Cr wnl on 08/19/21 - upper tract imaging- q 1-2 yrs: CT 11/06/22 negative - UDS repeat if change in sign/ symptoms - cysto only if rUTIs, hematuria or suspected anomaly: last cysto 11/09/21. Will repeat cysto in office  - We discussed that there was not comment on imaging of her duplicated right collecting system. Will do cysto in office but will likely warrant referral to urology for further evaluation of ureters (discussed possible retrograde imaging in OR).    2. Recurrent UTI - Discussed obtaining culture only if having symptoms. Since she has been symptomatic and has had several rounds of antibiotics, and cultures are showing resistances, will refer to infectious disease to get their opinion on possible need  for prophylactic antibiotics or further treatment.  -Continue estrace cream. Use 0.5g  twice a week for UTI prevention.    3. 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 Ok to monitor until she desires surgical excision.   Follow up for cysto in office  Marguerita Beards, MD

## 2022-11-25 ENCOUNTER — Ambulatory Visit: Payer: Medicaid Other

## 2022-11-25 DIAGNOSIS — R293 Abnormal posture: Secondary | ICD-10-CM

## 2022-11-25 DIAGNOSIS — M5459 Other low back pain: Secondary | ICD-10-CM

## 2022-11-25 DIAGNOSIS — R279 Unspecified lack of coordination: Secondary | ICD-10-CM

## 2022-11-25 DIAGNOSIS — M62838 Other muscle spasm: Secondary | ICD-10-CM

## 2022-11-25 DIAGNOSIS — M6281 Muscle weakness (generalized): Secondary | ICD-10-CM

## 2022-11-25 DIAGNOSIS — R102 Pelvic and perineal pain: Secondary | ICD-10-CM | POA: Diagnosis not present

## 2022-11-25 NOTE — Therapy (Signed)
OUTPATIENT PHYSICAL THERAPY FEMALE PELVIC TREATMENT   Patient Name: Melissa Pruitt MRN: [id number] DOB:[birth year], 47 y.o., female Today's Date: 11/25/2022  END OF SESSION:  PT End of Session - 11/25/22 1101     Visit Number 11    Date for PT Re-Evaluation 02/04/23    Authorization Type [employer]    Authorization Time Period 11/18/2022-12/17/2022    Authorization - Visit Number 1    Authorization - Number of Visits 4    PT Start Time 1100    PT Stop Time 1140    PT Time Calculation (min) 40 min    Activity Tolerance Patient tolerated treatment well    Behavior During Therapy [facility] for tasks assessed/performed                 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   R mid sole superior moderate atypia    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   R med sole inferior moderate atypia    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   R dorsum lat great toe   Dysplastic nevus 06/11/2020   left prox lat thigh sup, - severe   Dysplastic nevus 06/11/2020   left prox lat thigh inf, - moderate   Dysplastic nevus 06/11/2020   R mid to upper back 2.0cm lat to spine, Severe atypia   Dysplastic nevus 06/11/2020   R distal post thigh, moderate atypia   Dysplastic nevus 08/23/2020   Right labia - mild   Dysplastic nevus 08/12/2021   R lat neck post, moderate atypia   Dysplastic nevus 06/11/2012   L dorsal foot, mild atypia, bx from Dr. Larey Dresser   History of recurrent UTIs    HPV (human papilloma virus) infection    HSV (herpes simplex virus) infection    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[facility])    sacro/coccygeal mass excision   Melanoma (HCC) 02/16/2020   R mid plantar sole, BRESLOW'S DEPTH/MAXIMUM TUMOR THICKNESS: 0.5 MM, CLARK/ANATOMIC LEVEL: II Excised 03/20/2020   Normocytic anemia 08/21/2014   Tethered spinal cord (HCC)    Vitamin B 12 deficiency 08/21/2014   Past Surgical  History:  Procedure Laterality Date   BACK SURGERY     BLADDER SURGERY     COLOSTOMY     after flex sig as 47 year old.    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   Other specified disorders of uterus 10/12/2015   Herpes simplex 06/27/2015   Hyperlipidemia 05/30/2015   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   Normocytic anemia 08/21/2014   Vitamin B 12 deficiency 08/21/2014   Allergic rhinitis 01/19/2014   Condyloma acuminatum 01/09/2014   Chronic pain 08/09/2013   Cervical dysplasia 05/09/2013   Other headache syndrome 05/09/2013   Arthralgia  of temporomandibular joint 05/09/2013   Cervical pain 08/25/2012   Pain in joint, pelvic region and thigh 07/16/2012   Tethered spinal cord (HCC) 07/16/2012   Balance disorder 07/16/2012   Personal history of fall 07/16/2012   Neurogenic bladder 06/25/2011   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   OTHER SPECIFIED CONGENITAL ANOMALY SPINAL CORD 05/08/2010   UNSPEC CONGN ANOMALY BRAIN SP CORD&NERV SYSTEM 05/08/2010   H N P-LUMBAR 04/23/2010   BACK PAIN 01/28/2010   Papanicolaou smear of cervix with atypical squamous cells cannot exclude high grade squamous intraepithelial lesion (ASC-H) 04/14/1898    PCP: Orson Eva, NP  REFERRING PROVIDER: Lonell Face, MD  REFERRING DIAG: R10.2 Pelvic pain  THERAPY DIAG:  Pelvic pain  Abnormal  posture  Muscle weakness (generalized)  Unspecified lack of coordination  Other muscle spasm  Other low back pain  Rationale for Evaluation and Treatment: Rehabilitation  ONSET DATE: chronic  SUBJECTIVE:                                                                                                                                                                                            SUBJECTIVE STATEMENT: Pt saw Dr. Florian Buff and she is being referred to urology surgeon and infectious disease.She is very achy today and states that it is focused in her low back and hips.  PAIN:  Are you having pain? Yes NPRS scale: 8/10 Pain location: Rt ischial tuberosity with pain down lateral thigh to knee, Rt groin/anterior hip/abdomen, Rt low back, Rt side of tailbone   Pain type: aching, burning, dull, and stabbing  Pain description: constant (but level varies)   Aggravating factors: standing, sitting more than 30 mins Relieving factors: rest, muscle relaxer, taping, pelvic stability belt; popping, pillows for sleeping   PRECAUTIONS: Fall   WEIGHT BEARING RESTRICTIONS No   FALLS:  Has patient fallen in last 6 months? Yes. Number of falls 2   LIVING ENVIRONMENT: Lives with: lives with their family Lives in: House/apartment     OCCUPATION: disabled   PLOF: Independent   PATIENT GOALS to have less pain   PERTINENT HISTORY:  Myelomeningocele with surgical repair,  self-catheterizes for neurogenic bladder, Cervical cancer, HPV , recurrent UTIs, HSV , chronic rt hip pain, melanoma sle of Rt foot has been excised but has foot sensory changes, cervical radiculopathy, trigeminal neuralgia, grade 1 carpal tunnel, left mild chronic C7 radiculopathy   Sexual abuse: No   BOWEL MOVEMENT Pain with bowel movement: No Type of bowel movement:Type (Bristol Stool Scale) type 1-2 usually, will have diarrhea intermittently but thinks a lot of this is more about constipation  and low motility,  Frequency empty bowels every time caths, and usually this is enough unless has diarrhea, and Strain No Fully empty rectum: No does not have urge regularly, does have increased pressure in low back.   Leakage: Yes: with diarrhea wears AAT Pads: Yes: pull ups AAT Fiber supplement: No   URINATION Pain with urination: No Fully empty bladder: No does self cath to empty Stream: self-cath Urgency: No has no urge to have urine void Frequency: self caths 6x per day Leakage:  none Pads: No   INTERCOURSE Pain with intercourse:  not painful internally but will have pain at Rt pelvis or hip Ability to have vaginal penetration:  Yes:   Climax: yes not painful Marinoff Scale: 0/3   PREGNANCY Vaginal deliveries 3 Tearing Yes: tearing with first with stiches, tearing with second C-section deliveries 0 Currently pregnant No   PROLAPSE None       OBJECTIVE:   11/18/22 Significant Lt anterior pelvic rotation/posterior Rt rotation still present  PFIQ-7: 77  MMT eval  Vaginal 1-2/5 with coordination still improved and less effort, equal bil  (Blank rows = not tested)  PALPATION: Presence of cyst in vaginal opening (pt reports 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); tenderness throughout Rt pelvic floor with pai nreproduction   10/07/22: LUMBAR AROM/PROM   A/PROM A/PROM  (% available)  Flexion WFL  Extension 75 with pain Rt  Right lateral flexion 75, pain with compression on Rt  Left lateral flexion 50 limited by Rt side  Right rotation WNL  Left rotation 75 limtied by Rt side   LOWER EXTREMITY MMT:   MMT Right Eval All out of 5 Left Eval All out of 5  Hip flexion 2 4  Hip extension 3 4  Hip abduction 2+ 4  Hip adduction 3 4  Hip internal rotation  4 4  Hip external rotation  4 4  Knee flexion 3+ 4+  Knee extension 2+ 4+  Ankle dorsiflexion 0 4  Ankle plantarflexion 1+ 4  Ankle inversion 1+ 4  Ankle eversion 1+ 4    External Perineal Exam: no light touch, can feel deep  pressure over Rt labia               Internal Pelvic Floor :  internal vaginal assessment only performed; decrease sensation throughout Rt superficial/deep pelvic floor; does have sensation over Rt obturator internus   Patient confirms identification and approves PT to assess internal pelvic floor and treatment Yes   PELVIC MMT:   MMT eval  Vaginal 1-2/5 with better coordination and less effort, equal bil  (Blank rows = not tested)  09/03/22 External Perineal Exam: no sensation Rt labia               Internal Pelvic Floor :  no sensation/decreased sensation superficial/deep pelvic floor - more sensation with palpation closer to coccyx vaginally, no sensation palpated on or lateral to coccyx rectally; significant hypermobility of coccyx; 100% pain reduction in low back/coccyx with posterior pressure on Rt coccyx; increase in low back pain with posterior pressure on Lt coccyx; spasm in Rt OI   Patient confirms identification and approves PT to assess internal pelvic floor and treatment Yes-next session    PELVIC MMT:   MMT eval  Vaginal 1/5 with significant effort   Internal Anal Sphincter  0/5  External Anal Sphincter 0/5 Rt, 1/5 Lt   Puborectalis  1/5  Diastasis Recti    (Blank rows = not tested)  TONE: Low, decreased more on Rt than Lt   PROLAPSE: Grade 2 anterior vaginal wall laxity  08/20/22  PATIENT SURVEYS:  PFIQ-7 133     COGNITION:            Overall cognitive status: Within functional limits for tasks assessed                          SENSATION:            Light touch: Deficits Rt flank down to toes has decreased sensation, decreased sensation at external genitalis             Proprioception: Deficits Rt side decreased    MUSCLE LENGTH: Rt hamstring and adductor limited by 50%, Lt 25%  GAIT: Comments: decreased mobility at Rt LE, foot drop Rt foot, decreased flexion of Rt knee, hip circumduction noted with gait at Rt, decreased cadence   POSTURE:  rounded shoulders, forward head, and posterior pelvic tilt; Lt posterior/Rt anterior rotation; Slight Lt iliac crest elevation  FUNCTIONAL TASK: Lunge: bil instability present, pain with Rt   LUMBARAROM/PROM   A/PROM A/PROM  Eval (% available)  Flexion WFL  Extension 50 with pain Rt  Right lateral flexion WNL  Left lateral flexion 50 limited by Rt side  Right rotation WNL  Left rotation 50 limtied by Rt side   (Blank rows = not tested)   LOWER EXTREMITY ROM:   WFL but tension felt pt pain at Rt side   LOWER EXTREMITY MMT:   MMT Right Eval All out of 5 Left Eval All out of 5  Hip flexion 2 4  Hip extension 3 4  Hip abduction 2+ 4  Hip adduction 3 4  Hip internal rotation  4 4  Hip external rotation  4 4  Knee flexion 3+ 4+  Knee extension 2+ 4+  Ankle dorsiflexion 0 4  Ankle plantarflexion 1+ 4  Ankle inversion 1+ 4  Ankle eversion 1+ 4    PALPATION: General: Pain throughout Rt lower quadrant, flank, low back; significant scar tissue restriction across low back/sacrum       TODAY'S TREATMENT  11/25/22 Manual: Myofascial release low back Lumbar paraspinal/glute soft tissue mobilization Exercises: Cat cow 2 x 10 Cat cow to child's pose 12x 3-way kick, 10x each, bil Kneeling hip flexor/adductor/QL stretch 2 min bil  11/18/22 Manual: Pt provides verbal consent for internal vaginal/rectal pelvic floor exam. Vaginal pelvic floor muscle reassessment  Rt superficial/deep pelvic floor muscle release   11/11/22 Manual: Trigger Point Dry-Needling  Treatment instructions: Expect mild to moderate muscle soreness. S/S of pneumothorax if dry needled over a lung field, and to seek immediate medical attention should they occur. Patient verbalized understanding of these instructions and education.  Patient Consent Given: Yes Education handout provided: Previously provided Muscles treated: Rt thoracic paraspinals Electrical stimulation performed: No Parameters:  N/A Treatment response/outcome: twitch response/release Soft tissue mobilization to thoracic paraspinals and trigger points over Rt rib cage Negative pressure soft tissue mobilization over Rt upper quadrant Diaphragm ischemic release  Exercises: Cat cow 2 x 10 Thread the needle 2 min bil Serratus punch 5x  PATIENT EDUCATION:  Education details: See above Person educated: Patient Education method: Programmer, multimedia, Demonstration, Tactile cues, Verbal cues, and Handouts Education comprehension: verbalized understanding and returned demonstration     HOME EXERCISE PROGRAM: [id number]   ASSESSMENT:   CLINICAL IMPRESSION: Pt having a lot of pain today throughout low back and pelvis. Attempted mobility,  core, and pelvic stability activities to help reduce pain, but pt did not have pain reduction until myofascial release performed. Good release of tension with these techniques. Believe patient continues to have exacerbation of pain due to decreased pelvic stability and scar tissue restriction. She will continue to benefit from skilled PT intervention in order to decrease pain, begin/progress functional strengthening program, improve bowel movements, and improve quality of life.      OBJECTIVE IMPAIRMENTS decreased activity tolerance, decreased coordination, decreased endurance, decreased mobility, difficulty walking, decreased strength, increased fascial restrictions, increased muscle spasms, impaired flexibility, improper body mechanics, postural dysfunction, and pain.    ACTIVITY LIMITATIONS carrying, lifting, sitting, standing, squatting, stairs, continence, toileting, and locomotion level   PARTICIPATION LIMITATIONS: meal prep, cleaning, interpersonal relationship, driving, shopping, community activity, and yard work   PERSONAL FACTORS Past/current experiences, Time since onset of injury/illness/exacerbation, and 3+ comorbidities: medical history  are also affecting patient's functional  outcome.    REHAB POTENTIAL: Good   CLINICAL DECISION MAKING: Evolving/moderate complexity   EVALUATION COMPLEXITY: Moderate     GOALS: Goals reviewed with patient? Yes   SHORT TERM GOALS: Target date: 09/24/22 - updated 10/07/22 - 11/04/22 - updated 11/18/22   Pt to be I with HEP.  Baseline: Goal status: MET 10/07/22   2.  Pt will report 25% reduction of pain due to improvements in posture, strength, and muscle length  Baseline: She reports that on average she feels better 25% of the time, but there are still days in which pain is very high, like today Goal status: MET 11/18/22   3.  Pt will report her BMs and bladder voids are complete due to improved bowel habits and evacuation techniques.  Baseline: She states that she is now able to completely empty bladder, but bowel is not emptying completely even with splinting techniques Goal status: IN PROGRESS     LONG TERM GOALS: Target date: 02/04/23 - updated 10/07/22 - updated 11/04/22 - updated 11/18/22   Pt to be I with advanced HEP.  Baseline:  Goal status: IN PROGRESS   2.  Pt will report 50% reduction of pain due to improvements in posture, strength, and muscle length  Baseline:  Goal status: IN PROGRESS   3.  Pt to be I with pelvic wand/dilator use for pain management outside of PT.  Baseline: Pt working with dilators for pain reduction - believe she needs pelvic wand Goal status: MET 11/18/22   4.  Pt to be I with abdominal massage, voiding mechanics, breathing mechanics for improved pelvic relaxation and decreased pain.  Baseline:  Goal status: IN PROGRESS   5.  Pt to demonstrated improved coordination of pelvic floor and breathing mechanics with functional squat with no increase in pain. Baseline:  Goal status: IN PROGRESS   6.  Pt to demonstrate at least 3/5 pelvic floor strength and ability to fully relax post contraction for improved pelvic stability and decreased strain at pelvic floor. Baseline: improved to 1-2/5 Goal  status: IN PROGRESS   PLAN: PT FREQUENCY: 1-2x/week   PT DURATION:  6 months   PLANNED INTERVENTIONS: Therapeutic exercises, Therapeutic activity, Neuromuscular re-education, Patient/Family education, Self Care, Joint mobilization, Aquatic Therapy, Dry Needling, Spinal mobilization, Cryotherapy, Moist heat, scar mobilization, Taping, Vasopneumatic device, Biofeedback, and Manual therapy   PLAN FOR NEXT SESSION: Pelvic stability training; pelvic floor wand use.   Julio Alm, PT, DPT08/13/2412:04 PM

## 2022-12-02 ENCOUNTER — Ambulatory Visit: Payer: Medicaid Other

## 2022-12-05 ENCOUNTER — Other Ambulatory Visit: Payer: Self-pay

## 2022-12-05 ENCOUNTER — Emergency Department (HOSPITAL_BASED_OUTPATIENT_CLINIC_OR_DEPARTMENT_OTHER)
Admission: EM | Admit: 2022-12-05 | Discharge: 2022-12-05 | Disposition: A | Discharge status: Home / Self Care | Payer: Medicaid Other | Source: Home / Self Care | Attending: Emergency Medicine | Admitting: Emergency Medicine

## 2022-12-05 DIAGNOSIS — S80861A Insect bite (nonvenomous), right lower leg, initial encounter: Secondary | ICD-10-CM | POA: Insufficient documentation

## 2022-12-05 DIAGNOSIS — W57XXXA Bitten or stung by nonvenomous insect and other nonvenomous arthropods, initial encounter: Secondary | ICD-10-CM | POA: Insufficient documentation

## 2022-12-05 DIAGNOSIS — S80869A Insect bite (nonvenomous), unspecified lower leg, initial encounter: Secondary | ICD-10-CM

## 2022-12-05 DIAGNOSIS — S80862A Insect bite (nonvenomous), left lower leg, initial encounter: Secondary | ICD-10-CM | POA: Insufficient documentation

## 2022-12-05 MED ORDER — BETAMETHASONE DIPROPIONATE 0.05 % EX OINT: TOPICAL_OINTMENT | Freq: Two times a day (BID) | CUTANEOUS | 2 refills | Status: DC

## 2022-12-05 MED ORDER — DOXYCYCLINE HYCLATE 100 MG PO TABS
200.0000 mg | ORAL_TABLET | Freq: Once | ORAL | Status: AC
  Administered 2022-12-05: 200 mg via ORAL
  Filled 2022-12-05: qty 2

## 2022-12-05 MED ORDER — METHYLPREDNISOLONE 4 MG PO TBPK: ORAL_TABLET | ORAL | 0 refills | Status: DC

## 2022-12-05 NOTE — ED Triage Notes (Signed)
[place] and got into a tick nest. Pt red bug bites and itching all over legs. Airway intact, no trouble breathing, no trouble swallowing.

## 2022-12-05 NOTE — ED Provider Notes (Signed)
[place] EMERGENCY DEPARTMENT AT [facility] Provider Note   CSN: [id number] Arrival date & time: 12/05/22  1057     History  Chief Complaint  Patient presents with   Allergic Reaction    Melissa Pruitt is a 47 y.o. female.   Allergic Reaction  Patient is a 47 year old female, she has a history of multiple different tick related illnesses including a history of [place] spotted fever, chronic Lyme's disease, alpha gal, she reports that she was in the creek yesterday and when she came out of the [facility] she found that she had multiple tiny small insects that were crawling on her and seemingly biting her skin.  She got all of them off, she went to bed last night and when she woke up this morning she had itching at each of the red spots on her legs.  She does have a history of allergies to this in the past she has an EpiPen, she does not have any urticaria, she does not have any swelling in her throat her tongue or her face and she does not feel short of breath, she did take some Benadryl prior to arrival without any help and tried using hydrocortisone cream without any improvement.    Home Medications Prior to Admission medications   Medication Sig Start Date End Date Taking? Authorizing Provider  betamethasone dipropionate (DIPROLENE) 0.05 % ointment Apply topically 2 (two) times daily. 12/05/22  Yes Eber Hong, MD  methylPREDNISolone (MEDROL DOSEPAK) 4 MG TBPK tablet Taper over 6 days 12/05/22  Yes Eber Hong, MD  conjugated estrogens (PREMARIN) vaginal cream Place 1 Applicatorful vaginally 2 (two) times a week. Place 0.5g nightly for two weeks then twice a week after 03/01/20   Marguerita Beards, MD  Cyanocobalamin (VITAMIN B-12) 1000 MCG SUBL Place under the tongue as needed.    [provider]  EPINEPHrine 0.3 mg/0.3 mL IJ SOAJ injection Inject into the muscle once.    [provider]  estradiol (ESTRACE) 1 MG tablet Take 1 tablet (1 mg total) by  mouth daily. Take for 2 weeks each month as needed for breakthrough bleeding only 07/17/22   Lorriane Shire, MD  fluticasone (FLONASE) 50 MCG/ACT nasal spray Place 2 sprays into the nose daily.    [provider]  HYDROcodone-acetaminophen (NORCO/VICODIN) 5-325 MG tablet Take 1 tablet by mouth every 6 (six) hours as needed for severe pain. 07/25/22   Renne Crigler, PA-C  hydrocortisone 2.5 % cream Apply topically at bedtime. Apply to under left arm on Monday, Wednesday and Friday. 06/11/20   Deirdre Evener, MD  Incontinence Supply Disposable (COMFORT PROTECT ADULT DIAPER/M) MISC 1 Device by Does not apply route as needed. 05/12/22   Selmer Dominion, NP  loratadine (CLARITIN) 10 MG tablet Take 10 mg by mouth daily.    [provider]  methocarbamol (ROBAXIN) 750 MG tablet Take 750 mg by mouth 3 (three) times daily.    [provider]  norethindrone (MICRONOR) 0.35 MG tablet Take 1 tablet (0.35 mg total) by mouth daily. 06/12/22   Lorriane Shire, MD  oxyCODONE (ROXICODONE) 5 MG immediate release tablet Take 1 tablet (5 mg total) by mouth every 6 (six) hours as needed for up to 10 doses for breakthrough pain. 08/10/22   Curatolo, Adam, DO  promethazine (PHENERGAN) 12.5 MG tablet Take 1 tablet (12.5 mg total) by mouth every 8 (eight) hours as needed for nausea or vomiting. 11/12/22   Selmer Dominion, NP  Allergies    Ciprofloxacin, Macrobid [nitrofurantoin monohyd macro], Morphine and codeine, Sulfa antibiotics, Azithromycin, Dilaudid [hydromorphone hcl], Lactose, Prednisolone, Prednisone, Rosuvastatin, Tramadol hcl, Vitamin d analogs, Lidocaine, and Pork-derived products    Review of Systems   Review of Systems  All other systems reviewed and are negative.   Physical Exam Updated Vital Signs BP 126/73 (BP Location: Right Arm)   Temp 99 F (37.2 C) (Oral)   Resp 16   Ht 1.626 m (5\' 4" )   Wt 51.3 kg   SpO2 98%   BMI 19.41 kg/m  Physical Exam Vitals  and nursing note reviewed.  Constitutional:      General: She is not in acute distress.    Appearance: She is well-developed.  HENT:     Head: Normocephalic and atraumatic.     Mouth/Throat:     Pharynx: No oropharyngeal exudate.  Eyes:     General: No scleral icterus.       Right eye: No discharge.        Left eye: No discharge.     Conjunctiva/sclera: Conjunctivae normal.     Pupils: Pupils are equal, round, and reactive to light.  Neck:     Thyroid: No thyromegaly.     Vascular: No JVD.  Cardiovascular:     Rate and Rhythm: Normal rate and regular rhythm.     Heart sounds: Normal heart sounds. No murmur heard.    No friction rub. No gallop.  Pulmonary:     Effort: Pulmonary effort is normal. No respiratory distress.     Breath sounds: Normal breath sounds. No wheezing or rales.  Abdominal:     General: Bowel sounds are normal. There is no distension.     Palpations: Abdomen is soft. There is no mass.     Tenderness: There is no abdominal tenderness.  Musculoskeletal:        General: No tenderness. Normal range of motion.     Cervical back: Normal range of motion and neck supple.  Lymphadenopathy:     Cervical: No cervical adenopathy.  Skin:    General: Skin is warm and dry.     Findings: Rash present. No erythema.     Comments: Multiple small red papular lesions across the lower extremities, no tics are seen within these wounds or these areas, there is no urticaria no petechiae no purpura, nothing on the arms the belly or the face or the neck.  Neurological:     Mental Status: She is alert.     Coordination: Coordination normal.  Psychiatric:        Behavior: Behavior normal.     ED Results / Procedures / Treatments   Labs (all labs ordered are listed, but only abnormal results are displayed) Labs Reviewed - No data to display  EKG None  Radiology No results found.  Procedures Procedures    Medications Ordered in ED Medications  doxycycline  (VIBRA-TABS) tablet 200 mg (has no administration in time range)    ED Course/ Medical Decision Making/ A&P                                 Medical Decision Making Risk Prescription drug management.   Totally normal oropharynx, there is no signs of swelling, phonation is normal, vital signs are totally normal, the patient has a benign exam, she showed a picture of the tiny insect that was on her, it is difficult to tell  whether it is a tick or some other insect.  That being said this has an appearance of a possible insect bite, it could have been chigger bites, she appears very itchy on her legs at the sites and would most likely benefit from a topical steroid.  She has had an allergy to prednisone in the past which caused palpitations but she took IV Solu-Medrol without any symptoms, Medrol Dosepak will be given, she will be given a single dose of 200 mg of doxycycline.  She is agreeable and has follow-up with infectious disease coming up.        Final Clinical Impression(s) / ED Diagnoses Final diagnoses:  Insect bite of lower extremity, unspecified laterality, initial encounter    Rx / DC Orders ED Discharge Orders          Ordered    methylPREDNISolone (MEDROL DOSEPAK) 4 MG TBPK tablet        12/05/22 1115    betamethasone dipropionate (DIPROLENE) 0.05 % ointment  2 times daily        12/05/22 1115              Eber Hong, MD 12/05/22 1116

## 2022-12-05 NOTE — Discharge Instructions (Addendum)
Have given you the prophylactic dose of doxycycline to help prevent any acute tick related infections, I have also prescribed a medication called Medrol which is to be taken over 6 days, this is related to prednisone, it may or may not cause the same feeling of heart racing, if that occurs stop the medicine and return to the ER immediately.  I also have given you a topical steroid called betamethasone, apply this twice a day to the areas that are very itchy.  25 mg of Benadryl every 6 hours as needed for severe itching   Take your EpiPen if the symptoms are severe but come to the ER if that occurs  return to the ER for severe or worsening symptoms

## 2022-12-05 NOTE — ED Notes (Signed)
ED Provider at bedside. 

## 2022-12-08 ENCOUNTER — Ambulatory Visit: Payer: Medicaid Other | Admitting: Obstetrics and Gynecology

## 2022-12-09 ENCOUNTER — Ambulatory Visit: Payer: Medicaid Other

## 2022-12-09 DIAGNOSIS — M62838 Other muscle spasm: Secondary | ICD-10-CM

## 2022-12-09 DIAGNOSIS — R102 Pelvic and perineal pain: Secondary | ICD-10-CM | POA: Diagnosis not present

## 2022-12-09 DIAGNOSIS — M6281 Muscle weakness (generalized): Secondary | ICD-10-CM

## 2022-12-09 DIAGNOSIS — R293 Abnormal posture: Secondary | ICD-10-CM

## 2022-12-09 DIAGNOSIS — M5459 Other low back pain: Secondary | ICD-10-CM

## 2022-12-09 DIAGNOSIS — R279 Unspecified lack of coordination: Secondary | ICD-10-CM

## 2022-12-09 NOTE — Therapy (Signed)
OUTPATIENT PHYSICAL THERAPY FEMALE PELVIC TREATMENT   Patient Name: Melissa Pruitt MRN: [id number] DOB:1975-08-14, 47 y.o., female Today's Date: 12/09/2022  END OF SESSION:  PT End of Session - 12/09/22 1104     Visit Number 12    Date for PT Re-Evaluation 02/04/23    Authorization Type [employer]    Authorization Time Period 11/18/2022-12/17/2022    Authorization - Visit Number 2    Authorization - Number of Visits 4    PT Start Time 1100    PT Stop Time 1140    PT Time Calculation (min) 40 min    Activity Tolerance Patient tolerated treatment well    Behavior During Therapy [facility] for tasks assessed/performed                 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   R mid sole superior moderate atypia    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   R med sole inferior moderate atypia    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   R dorsum lat great toe   Dysplastic nevus 06/11/2020   left prox lat thigh sup, - severe   Dysplastic nevus 06/11/2020   left prox lat thigh inf, - moderate   Dysplastic nevus 06/11/2020   R mid to upper back 2.0cm lat to spine, Severe atypia   Dysplastic nevus 06/11/2020   R distal post thigh, moderate atypia   Dysplastic nevus 08/23/2020   Right labia - mild   Dysplastic nevus 08/12/2021   R lat neck post, moderate atypia   Dysplastic nevus 06/11/2012   L dorsal foot, mild atypia, bx from Dr. Larey Dresser   History of recurrent UTIs    HPV (human papilloma virus) infection    HSV (herpes simplex virus) infection    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[facility])    sacro/coccygeal mass excision   Melanoma (HCC) 02/16/2020   R mid plantar sole, BRESLOW'S DEPTH/MAXIMUM TUMOR THICKNESS: 0.5 MM, CLARK/ANATOMIC LEVEL: II Excised 03/20/2020   Normocytic anemia 08/21/2014   Tethered spinal cord (HCC)    Vitamin B 12 deficiency 08/21/2014   Past Surgical  History:  Procedure Laterality Date   BACK SURGERY     BLADDER SURGERY     COLOSTOMY     after flex sig as 47 year old.    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   Other specified disorders of uterus 10/12/2015   Herpes simplex 06/27/2015   Hyperlipidemia 05/30/2015   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   Normocytic anemia 08/21/2014   Vitamin B 12 deficiency 08/21/2014   Allergic rhinitis 01/19/2014   Condyloma acuminatum 01/09/2014   Chronic pain 08/09/2013   Cervical dysplasia 05/09/2013   Other headache syndrome 05/09/2013   Arthralgia  of temporomandibular joint 05/09/2013   Cervical pain 08/25/2012   Pain in joint, pelvic region and thigh 07/16/2012   Tethered spinal cord (HCC) 07/16/2012   Balance disorder 07/16/2012   Personal history of fall 07/16/2012   Neurogenic bladder 06/25/2011   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   OTHER SPECIFIED CONGENITAL ANOMALY SPINAL CORD 05/08/2010   UNSPEC CONGN ANOMALY BRAIN SP CORD&NERV SYSTEM 05/08/2010   H N P-LUMBAR 04/23/2010   BACK PAIN 01/28/2010   Papanicolaou smear of cervix with atypical squamous cells cannot exclude high grade squamous intraepithelial lesion (ASC-H) 04/14/1898    PCP: Orson Eva, NP  REFERRING PROVIDER: Lonell Face, MD  REFERRING DIAG: R10.2 Pelvic pain  THERAPY DIAG:  Pelvic pain  Abnormal  posture  Muscle weakness (generalized)  Unspecified lack of coordination  Other muscle spasm  Other low back pain  Rationale for Evaluation and Treatment: Rehabilitation  ONSET DATE: chronic  SUBJECTIVE:                                                                                                                                                                                            SUBJECTIVE STATEMENT: Pt states that she is having more pain this week after having to walk in a funny way due to tick bites all over her legs.  PAIN:  Are you having pain? Yes NPRS scale: 9/10 Pain location: Rt ischial tuberosity with pain down lateral thigh to knee, Rt groin/anterior hip/abdomen, Rt low back, Rt side of tailbone   Pain type: aching, burning, dull, and stabbing  Pain description: constant (but level varies)   Aggravating factors: standing, sitting more than 30 mins Relieving factors: rest, muscle relaxer, taping, pelvic stability belt; popping, pillows for sleeping   PRECAUTIONS: Fall   WEIGHT BEARING RESTRICTIONS No   FALLS:  Has patient fallen in last 6 months? Yes. Number of falls 2   LIVING ENVIRONMENT: Lives with: lives with their family Lives in: House/apartment     OCCUPATION: disabled   PLOF: Independent   PATIENT GOALS to have less pain   PERTINENT HISTORY:  Myelomeningocele with surgical repair,  self-catheterizes for neurogenic bladder, Cervical cancer, HPV , recurrent UTIs, HSV , chronic rt hip pain, melanoma sle of Rt foot has been excised but has foot sensory changes, cervical radiculopathy, trigeminal neuralgia, grade 1 carpal tunnel, left mild chronic C7 radiculopathy   Sexual abuse: No   BOWEL MOVEMENT Pain with bowel movement: No Type of bowel movement:Type (Bristol Stool Scale) type 1-2 usually, will have diarrhea intermittently but thinks a lot of this is more about constipation and low motility, Frequency empty  bowels every time caths, and  usually this is enough unless has diarrhea, and Strain No Fully empty rectum: No does not have urge regularly, does have increased pressure in low back.   Leakage: Yes: with diarrhea wears AAT Pads: Yes: pull ups AAT Fiber supplement: No   URINATION Pain with urination: No Fully empty bladder: No does self cath to empty Stream: self-cath Urgency: No has no urge to have urine void Frequency: self caths 6x per day Leakage:  none Pads: No   INTERCOURSE Pain with intercourse:  not painful internally but will have pain at Rt pelvis or hip Ability to have vaginal penetration:  Yes:   Climax: yes not painful Marinoff Scale: 0/3   PREGNANCY Vaginal deliveries 3 Tearing Yes: tearing with first with stiches, tearing with second C-section deliveries 0 Currently pregnant No   PROLAPSE None       OBJECTIVE:   11/18/22 Significant Lt anterior pelvic rotation/posterior Rt rotation still present  PFIQ-7: 77  MMT eval  Vaginal 1-2/5 with coordination still improved and less effort, equal bil  (Blank rows = not tested)  PALPATION: Presence of cyst in vaginal opening (pt reports 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); tenderness throughout Rt pelvic floor with pai nreproduction   10/07/22: LUMBAR AROM/PROM   A/PROM A/PROM  (% available)  Flexion WFL  Extension 75 with pain Rt  Right lateral flexion 75, pain with compression on Rt  Left lateral flexion 50 limited by Rt side  Right rotation WNL  Left rotation 75 limtied by Rt side   LOWER EXTREMITY MMT:   MMT Right Eval All out of 5 Left Eval All out of 5  Hip flexion 2 4  Hip extension 3 4  Hip abduction 2+ 4  Hip adduction 3 4  Hip internal rotation  4 4  Hip external rotation  4 4  Knee flexion 3+ 4+  Knee extension 2+ 4+  Ankle dorsiflexion 0 4  Ankle plantarflexion 1+ 4  Ankle inversion 1+ 4  Ankle eversion 1+ 4    External Perineal Exam: no light touch, can feel deep pressure over Rt labia               Internal  Pelvic Floor :  internal vaginal assessment only performed; decrease sensation throughout Rt superficial/deep pelvic floor; does have sensation over Rt obturator internus   Patient confirms identification and approves PT to assess internal pelvic floor and treatment Yes   PELVIC MMT:   MMT eval  Vaginal 1-2/5 with better coordination and less effort, equal bil  (Blank rows = not tested)  09/03/22 External Perineal Exam: no sensation Rt labia               Internal Pelvic Floor :  no sensation/decreased sensation superficial/deep pelvic floor - more sensation with palpation closer to coccyx vaginally, no sensation palpated on or lateral to coccyx rectally; significant hypermobility of coccyx; 100% pain reduction in low back/coccyx with posterior pressure on Rt coccyx; increase in low back pain with posterior pressure on Lt coccyx; spasm in Rt OI   Patient confirms identification and approves PT to assess internal pelvic floor and treatment Yes-next session    PELVIC MMT:   MMT eval  Vaginal 1/5 with significant effort   Internal Anal Sphincter  0/5  External Anal Sphincter 0/5 Rt, 1/5 Lt   Puborectalis  1/5  Diastasis Recti    (Blank rows = not tested)  TONE: Low, decreased more on Rt than Lt   PROLAPSE: Grade 2 anterior vaginal wall laxity  08/20/22  PATIENT SURVEYS:  PFIQ-7 133     COGNITION:            Overall cognitive status: Within functional limits for tasks assessed                          SENSATION:            Light touch: Deficits Rt flank down to toes has decreased sensation, decreased sensation at external genitalis             Proprioception: Deficits Rt side decreased    MUSCLE LENGTH: Rt hamstring and adductor limited by 50%, Lt 25%  GAIT: Comments: decreased mobility at Rt LE, foot drop Rt foot, decreased flexion of Rt knee, hip circumduction noted with gait at Rt, decreased cadence   POSTURE: rounded shoulders, forward head, and posterior  pelvic tilt; Lt posterior/Rt anterior rotation; Slight Lt iliac crest elevation  FUNCTIONAL TASK: Lunge: bil instability present, pain with Rt   LUMBARAROM/PROM   A/PROM A/PROM  Eval (% available)  Flexion WFL  Extension 50 with pain Rt  Right lateral flexion WNL  Left lateral flexion 50 limited by Rt side  Right rotation WNL  Left rotation 50 limtied by Rt side   (Blank rows = not tested)   LOWER EXTREMITY ROM:   WFL but tension felt pt pain at Rt side   LOWER EXTREMITY MMT:   MMT Right Eval All out of 5 Left Eval All out of 5  Hip flexion 2 4  Hip extension 3 4  Hip abduction 2+ 4  Hip adduction 3 4  Hip internal rotation  4 4  Hip external rotation  4 4  Knee flexion 3+ 4+  Knee extension 2+ 4+  Ankle dorsiflexion 0 4  Ankle plantarflexion 1+ 4  Ankle inversion 1+ 4  Ankle eversion 1+ 4    PALPATION: General: Pain throughout Rt lower quadrant, flank, low back; significant scar tissue restriction across low back/sacrum       TODAY'S TREATMENT  12/09/22 Manual: Pt provides verbal consent for internal vaginal/rectal pelvic floor exam. Internal vaginal Rt sided pelvic floor muscle release Therapeutic activities: Pelvic floor wand training in session   11/25/22 Manual: Myofascial release low back Lumbar paraspinal/glute soft tissue mobilization Exercises: Cat cow 2 x 10 Cat cow to child's pose 12x 3-way kick, 10x each, bil Kneeling hip flexor/adductor/QL stretch 2 min bil  11/18/22 Manual: Pt provides verbal consent for internal vaginal/rectal pelvic floor exam. Vaginal pelvic floor muscle reassessment  Rt superficial/deep pelvic floor muscle release  PATIENT EDUCATION:  Education details: See above Person educated: Patient Education method: Programmer, multimedia, Demonstration, Tactile cues, Verbal cues, and Handouts Education comprehension: verbalized understanding and returned demonstration     HOME EXERCISE PROGRAM: [id number]   ASSESSMENT:    CLINICAL IMPRESSION: Pt has had difficult time with pain exacerbation around Rt side of pelvis and into low back due to altered gait pattern with avoiding irritation of tick bites. She had significant improvement in pain with internal pelvic floor muscle release. She also brought her own wand to work on learning how to use. Patient helped with positioning, pressure, and proprioception of wand use today. She had difficulty achieving appropriate depth, therefore not being able to reproduce same release sensations as during manual therapy. However, with guidance she was able to begin recognizing more  appropriate depth and getting better release on her own. She will continue to benefit from skilled PT intervention in order to decrease pain, begin/progress functional strengthening program, improve bowel movements, and improve quality of life.      OBJECTIVE IMPAIRMENTS decreased activity tolerance, decreased coordination, decreased endurance, decreased mobility, difficulty walking, decreased strength, increased fascial restrictions, increased muscle spasms, impaired flexibility, improper body mechanics, postural dysfunction, and pain.    ACTIVITY LIMITATIONS carrying, lifting, sitting, standing, squatting, stairs, continence, toileting, and locomotion level   PARTICIPATION LIMITATIONS: meal prep, cleaning, interpersonal relationship, driving, shopping, community activity, and yard work   PERSONAL FACTORS Past/current experiences, Time since onset of injury/illness/exacerbation, and 3+ comorbidities: medical history  are also affecting patient's functional outcome.    REHAB POTENTIAL: Good   CLINICAL DECISION MAKING: Evolving/moderate complexity   EVALUATION COMPLEXITY: Moderate     GOALS: Goals reviewed with patient? Yes   SHORT TERM GOALS: Target date: 09/24/22 - updated 10/07/22 - 11/04/22 - updated 11/18/22   Pt to be I with HEP.  Baseline: Goal status: MET 10/07/22   2.  Pt will report 25%  reduction of pain due to improvements in posture, strength, and muscle length  Baseline: She reports that on average she feels better 25% of the time, but there are still days in which pain is very high, like today Goal status: MET 11/18/22   3.  Pt will report her BMs and bladder voids are complete due to improved bowel habits and evacuation techniques.  Baseline: She states that she is now able to completely empty bladder, but bowel is not emptying completely even with splinting techniques Goal status: IN PROGRESS     LONG TERM GOALS: Target date: 02/04/23 - updated 10/07/22 - updated 11/04/22 - updated 11/18/22   Pt to be I with advanced HEP.  Baseline:  Goal status: IN PROGRESS   2.  Pt will report 50% reduction of pain due to improvements in posture, strength, and muscle length  Baseline:  Goal status: IN PROGRESS   3.  Pt to be I with pelvic wand/dilator use for pain management outside of PT.  Baseline: Pt working with dilators for pain reduction - believe she needs pelvic wand Goal status: MET 11/18/22   4.  Pt to be I with abdominal massage, voiding mechanics, breathing mechanics for improved pelvic relaxation and decreased pain.  Baseline:  Goal status: IN PROGRESS   5.  Pt to demonstrated improved coordination of pelvic floor and breathing mechanics with functional squat with no increase in pain. Baseline:  Goal status: IN PROGRESS   6.  Pt to demonstrate at least 3/5 pelvic floor strength and ability to fully relax post contraction for improved pelvic stability and decreased strain at pelvic floor. Baseline: improved to 1-2/5 Goal status: IN PROGRESS   PLAN: PT FREQUENCY: 1-2x/week   PT DURATION:  6 months   PLANNED INTERVENTIONS: Therapeutic exercises, Therapeutic activity, Neuromuscular re-education, Patient/Family education, Self Care, Joint mobilization, Aquatic Therapy, Dry Needling, Spinal mobilization, Cryotherapy, Moist heat, scar mobilization, Taping, Vasopneumatic  device, Biofeedback, and Manual therapy   PLAN FOR NEXT SESSION: Pelvic stability training; new authorization request.    Julio Alm, PT, DPT08/27/2411:37 AM

## 2022-12-10 ENCOUNTER — Ambulatory Visit: Payer: Medicaid Other | Admitting: Internal Medicine

## 2022-12-10 ENCOUNTER — Encounter: Payer: Self-pay | Admitting: Internal Medicine

## 2022-12-10 ENCOUNTER — Other Ambulatory Visit: Payer: Self-pay

## 2022-12-10 VITALS — BP 104/71 | HR 66 | Temp 98.2°F | Ht 64.0 in | Wt 114.0 lb

## 2022-12-10 DIAGNOSIS — R8271 Bacteriuria: Secondary | ICD-10-CM | POA: Insufficient documentation

## 2022-12-10 DIAGNOSIS — N319 Neuromuscular dysfunction of bladder, unspecified: Secondary | ICD-10-CM | POA: Diagnosis present

## 2022-12-10 NOTE — Progress Notes (Signed)
[facility]      Reason for Consult: history of UTIs    Referring Physician: Dr. Florian Buff    Patient ID: Melissa Pruitt, female    DOB: 1975-07-13, 47 y.o.   MRN: [id number]  HPI:   Ms Melissa Pruitt is here for evaluation of recurrent UTIs. She has a history of neurogenic bladder with recent issues with UTIs.  She self-caths about 6 times daily and is good about hygiene.  She has a history of UTIs with MDR organisms including ESBL E coli and has had multiple episodes of fever over 101, flank pain and some burning sensation.  Also with concentrated smelling urine.  She had an episode of fecal contamination during a sexual encounter preceding her recent episodes.  She has had previous infections and saw ID at [facility] in 2021 for similar issues and had MDR E coli then.  She is currently asymptomatic.  Klebsiella in recent culture from July with some resistance.   She also was recently bite by multiple ticks.  No fever, no rash, no new arthralgias or arthritis.  Asking about testing for tick-related infections.   Has had alpha-gal and getting tested again for it, avoiding red meat.  Has a history of nitrofurantoin allergy but was at the time of alpha-gal symptoms so does not think it is related to nitrofurantoin.   She has been on prophylactic antibiotics in the past and was getting fosfomycin regularly before.    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   R mid sole superior moderate atypia    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   R med sole inferior moderate atypia    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   R dorsum lat great toe   Dysplastic nevus 06/11/2020   left prox lat thigh sup, - severe   Dysplastic nevus 06/11/2020   left prox lat thigh inf, - moderate   Dysplastic nevus 06/11/2020   R mid to upper back 2.0cm lat to spine, Severe atypia   Dysplastic nevus 06/11/2020    R distal post thigh, moderate atypia   Dysplastic nevus 08/23/2020   Right labia - mild   Dysplastic nevus 08/12/2021   R lat neck post, moderate atypia   Dysplastic nevus 06/11/2012   L dorsal foot, mild atypia, bx from Dr. Larey Dresser   History of recurrent UTIs    HPV (human papilloma virus) infection    HSV (herpes simplex virus) infection    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[facility])    sacro/coccygeal mass excision   Melanoma (HCC) 02/16/2020   R mid plantar sole, BRESLOW'S DEPTH/MAXIMUM TUMOR THICKNESS: 0.5 MM, CLARK/ANATOMIC LEVEL: II Excised 03/20/2020   Normocytic anemia 08/21/2014   Tethered spinal cord (HCC)    Vitamin B 12 deficiency 08/21/2014    Prior to Admission medications   Medication Sig Start Date End Date Taking? Authorizing Provider  conjugated estrogens (PREMARIN) vaginal cream Place 1 Applicatorful vaginally 2 (two) times a week. Place 0.5g nightly for two weeks then twice a week after 03/01/20  Yes Marguerita Beards, MD  Cyanocobalamin (VITAMIN B-12) 1000 MCG SUBL Place under the tongue as needed.   Yes [provider]  EPINEPHrine 0.3 mg/0.3 mL IJ SOAJ injection Inject into the muscle once.   Yes [provider]  estradiol (ESTRACE) 1 MG tablet Take 1 tablet (1 mg total)  by mouth daily. Take for 2 weeks each month as needed for breakthrough bleeding only 07/17/22  Yes Ajewole, Christana, MD  fluticasone (FLONASE) 50 MCG/ACT nasal spray Place 2 sprays into the nose daily.   Yes [provider]  hydrocortisone 2.5 % cream Apply topically at bedtime. Apply to under left arm on Monday, Wednesday and Friday. 06/11/20  Yes Deirdre Evener, MD  hydrOXYzine (ATARAX) 10 MG tablet 1 tablet as needed Orally Once a day PM for 30 days 12/05/22  Yes [provider]  Incontinence Supply Disposable (COMFORT PROTECT ADULT DIAPER/M) MISC 1 Device by Does not apply route as needed. 05/12/22  Yes Selmer Dominion, NP  loratadine  (CLARITIN) 10 MG tablet Take 10 mg by mouth daily.   Yes [provider]  methocarbamol (ROBAXIN) 750 MG tablet Take 750 mg by mouth 3 (three) times daily.   Yes [provider]  mometasone (ELOCON) 0.1 % ointment 1 application Externally sparingly twice a day for 30 days 12/05/22  Yes [provider]  norethindrone (MICRONOR) 0.35 MG tablet Take 1 tablet (0.35 mg total) by mouth daily. 06/12/22  Yes Lorriane Shire, MD  promethazine (PHENERGAN) 12.5 MG tablet Take 1 tablet (12.5 mg total) by mouth every 8 (eight) hours as needed for nausea or vomiting. 11/12/22  Yes Zuleta, Joan Mayans, NP  betamethasone dipropionate (DIPROLENE) 0.05 % ointment Apply topically 2 (two) times daily. Patient not taking: Reported on 12/10/2022 12/05/22   Eber Hong, MD  HYDROcodone-acetaminophen (NORCO/VICODIN) 5-325 MG tablet Take 1 tablet by mouth every 6 (six) hours as needed for severe pain. Patient not taking: Reported on 12/10/2022 07/25/22   Renne Crigler, PA-C  methylPREDNISolone (MEDROL DOSEPAK) 4 MG TBPK tablet Taper over 6 days Patient not taking: Reported on 12/10/2022 12/05/22   Eber Hong, MD  oxyCODONE (ROXICODONE) 5 MG immediate release tablet Take 1 tablet (5 mg total) by mouth every 6 (six) hours as needed for up to 10 doses for breakthrough pain. Patient not taking: Reported on 12/10/2022 08/10/22   Virgina Norfolk, DO    Allergies  Allergen Reactions   Ciprofloxacin Anaphylaxis    Heart and nerve issues   Macrobid [Nitrofurantoin Monohyd Macro] Anaphylaxis, Hives and Shortness Of Breath   Morphine And Codeine Anaphylaxis   Sulfa Antibiotics Anaphylaxis    Patient reporting sulfa drugs cause anaphylaxis, difficulty breathing and hives, as well as nausea vomiting   Azithromycin Nausea And Vomiting   Dilaudid [Hydromorphone Hcl] Other (See Comments)    Makes patient feel like she out of it   Lactose     Other reaction(s): Other (See Comments) Digestive issues/pain    Prednisolone Other (See Comments)    Chest pain, left arm and hand tingling, eye pain, and mania   Prednisone Other (See Comments)   Rosuvastatin Other (See Comments)   Tramadol Hcl     REACTION: HEART PALPITATIONS /SWELLING /SOB   Vitamin D Analogs     Severe Diarhhea   Lidocaine Palpitations   Pork-Derived Products Nausea And Vomiting    Social History   Tobacco Use   Smoking status: Former    Current packs/day: 0.00    Types: Cigarettes    Quit date: 07/14/2011    Years since quitting: 11.4   Smokeless tobacco: Never   Tobacco comments:    1-3 cigarettes  Vaping Use   Vaping status: Never Used  Substance Use Topics   Alcohol use: Yes    Comment: twice a year   Drug use:  No    Family History  Problem Relation Age of Onset   Hypertension Mother    Atrial fibrillation Mother    Cervical cancer Maternal Grandmother    Colon cancer Neg Hx    Breast cancer Neg Hx     Review of Systems  Constitutional: negative for fevers and chills All other systems reviewed and are negative    Constitutional: in no apparent distress  Vitals:   12/10/22 0958  BP: 104/71  Pulse: 66  Temp: 98.2 F (36.8 C)  SpO2: 96%   EYES: anicteric ENMT: no thrush Respiratory: normal respiratory effort Musculoskeletal: multiple bites on both legs, small papules.  No erythema surrounding Skin: no rash  Labs: Lab Results  Component Value Date   WBC 4.9 10/28/2022   HGB 13.1 10/28/2022   HCT 37.6 10/28/2022   MCV 91 10/28/2022   PLT 192 10/28/2022    Lab Results  Component Value Date   CREATININE 0.67 10/28/2022   BUN 7 10/28/2022   NA 140 10/28/2022   K 4.0 10/28/2022   CL 101 10/28/2022   CO2 23 10/28/2022    Lab Results  Component Value Date   ALT 12 08/14/2022   AST 15 08/14/2022   ALKPHOS 27 (L) 08/14/2022   BILITOT 0.5 08/14/2022   INR 0.90 12/09/2016     Assessment/Plan: urinary bacterial colonization with recurrent UTIs.  I discussed urinary colonization vs  infection and treatment with antibiotics.  Emphasis placed on treatment when indicated and appropriate indications.  Discussed that with a neurogenic bladder, can be more difficult to tease out symptoms but certain fever, chills and no other source would be concerning.  I discussed that outside of symptoms, no role for testing the urine as it likely will be positive regardless.  I also emphasized short courses of antibiotics (3-5 days) when symptomatic.  Though she has a history of MDR E coli, can use current urine cultures as a guide to therapy when oral options noted.  As she is asymptomatic now, no indication for antibiotics. She can return as needed or if symptomatic.   In regards to tick-related illness, she has no particular symptoms or signs of an infection so I do not recommend testing or treatment.

## 2022-12-11 ENCOUNTER — Ambulatory Visit: Payer: Medicaid Other | Admitting: Cardiology

## 2022-12-16 ENCOUNTER — Ambulatory Visit: Payer: Medicaid Other | Attending: Neurology

## 2022-12-16 ENCOUNTER — Telehealth: Payer: Self-pay | Admitting: Cardiology

## 2022-12-16 ENCOUNTER — Other Ambulatory Visit: Payer: Self-pay | Admitting: Cardiology

## 2022-12-16 DIAGNOSIS — E785 Hyperlipidemia, unspecified: Secondary | ICD-10-CM

## 2022-12-16 DIAGNOSIS — M5459 Other low back pain: Secondary | ICD-10-CM | POA: Diagnosis present

## 2022-12-16 DIAGNOSIS — R293 Abnormal posture: Secondary | ICD-10-CM | POA: Diagnosis present

## 2022-12-16 DIAGNOSIS — Z1329 Encounter for screening for other suspected endocrine disorder: Secondary | ICD-10-CM

## 2022-12-16 DIAGNOSIS — R102 Pelvic and perineal pain: Secondary | ICD-10-CM | POA: Diagnosis present

## 2022-12-16 DIAGNOSIS — M62838 Other muscle spasm: Secondary | ICD-10-CM | POA: Insufficient documentation

## 2022-12-16 DIAGNOSIS — I10 Essential (primary) hypertension: Secondary | ICD-10-CM

## 2022-12-16 DIAGNOSIS — M6281 Muscle weakness (generalized): Secondary | ICD-10-CM | POA: Diagnosis present

## 2022-12-16 DIAGNOSIS — R279 Unspecified lack of coordination: Secondary | ICD-10-CM | POA: Insufficient documentation

## 2022-12-16 DIAGNOSIS — E782 Mixed hyperlipidemia: Secondary | ICD-10-CM

## 2022-12-16 DIAGNOSIS — Z131 Encounter for screening for diabetes mellitus: Secondary | ICD-10-CM

## 2022-12-16 NOTE — Telephone Encounter (Signed)
Patient called asking if her lab order can be placed into system so that patient can go to [facility] in [place]. Patient doesn't need the UA that Chelsa use to order, another provider has taken that over.

## 2022-12-16 NOTE — Therapy (Signed)
OUTPATIENT PHYSICAL THERAPY FEMALE PELVIC TREATMENT   Patient Name: Melissa Pruitt MRN: [id number] DOB:January 01, 1976, 47 y.o., female Today's Date: 12/16/2022  END OF SESSION:  PT End of Session - 12/16/22 1136     Visit Number 13    Date for PT Re-Evaluation 02/04/23    Authorization Type [employer]    Authorization Time Period 11/18/2022-12/17/2022    Authorization - Visit Number 3    Authorization - Number of Visits 4    PT Start Time 1136    PT Stop Time 1218    PT Time Calculation (min) 42 min    Activity Tolerance Patient tolerated treatment well    Behavior During Therapy [facility] for tasks assessed/performed                  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   R mid sole superior moderate atypia    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   R med sole inferior moderate atypia    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   R dorsum lat great toe   Dysplastic nevus 06/11/2020   left prox lat thigh sup, - severe   Dysplastic nevus 06/11/2020   left prox lat thigh inf, - moderate   Dysplastic nevus 06/11/2020   R mid to upper back 2.0cm lat to spine, Severe atypia   Dysplastic nevus 06/11/2020   R distal post thigh, moderate atypia   Dysplastic nevus 08/23/2020   Right labia - mild   Dysplastic nevus 08/12/2021   R lat neck post, moderate atypia   Dysplastic nevus 06/11/2012   L dorsal foot, mild atypia, bx from Dr. Larey Dresser   History of recurrent UTIs    HPV (human papilloma virus) infection    HSV (herpes simplex virus) infection    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[facility])    sacro/coccygeal mass excision   Melanoma (HCC) 02/16/2020   R mid plantar sole, BRESLOW'S DEPTH/MAXIMUM TUMOR THICKNESS: 0.5 MM, CLARK/ANATOMIC LEVEL: II Excised 03/20/2020   Normocytic anemia 08/21/2014   Tethered spinal cord (HCC)    Vitamin B 12 deficiency 08/21/2014   Past Surgical  History:  Procedure Laterality Date   BACK SURGERY     BLADDER SURGERY     COLOSTOMY     after flex sig as 47 year old.    colostomy reversal     EXPLORATORY LAPAROTOMY     with colostomy placement   LEEP     removal of cervical cancer     TUBAL LIGATION     Patient Active Problem List   Diagnosis Date Noted   Bacteriuria 12/10/2022   Allergic urticaria 07/02/2020   Food allergy 07/02/2020   Tendinitis of knee 07/02/2020   Vasomotor rhinitis 07/02/2020   Melanoma (HCC) 07/02/2020   Pain in joint of left shoulder 11/08/2019   Cervical radiculopathy 05/24/2019   Trigeminal neuralgia 11/05/2018   Symptomatic states associated with artificial menopause 11/05/2018   Arthralgia of hand, right 11/17/2017   Vitamin D deficiency 02/18/2017   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   Other specified disorders of uterus 10/12/2015   Herpes simplex 06/27/2015   Hyperlipidemia 05/30/2015   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   Normocytic anemia 08/21/2014   Vitamin B 12 deficiency 08/21/2014   Allergic rhinitis 01/19/2014   Condyloma acuminatum 01/09/2014   Chronic pain 08/09/2013   Cervical dysplasia 05/09/2013   Other headache  syndrome 05/09/2013   Arthralgia of temporomandibular joint 05/09/2013   Cervical pain 08/25/2012   Pain in joint, pelvic region and thigh 07/16/2012   Tethered spinal cord (HCC) 07/16/2012   Balance disorder 07/16/2012   Personal history of fall 07/16/2012   Neurogenic bladder 06/25/2011   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   OTHER SPECIFIED CONGENITAL ANOMALY SPINAL CORD 05/08/2010   UNSPEC CONGN ANOMALY BRAIN SP CORD&NERV SYSTEM 05/08/2010   H N P-LUMBAR 04/23/2010   BACK PAIN 01/28/2010   Papanicolaou smear of cervix with atypical squamous cells cannot exclude high grade squamous intraepithelial lesion (ASC-H) 04/14/1898    PCP: Orson Eva, NP  REFERRING PROVIDER: Lonell Face, MD  REFERRING DIAG: R10.2 Pelvic pain  THERAPY DIAG:  Pelvic  pain  Abnormal posture  Muscle weakness (generalized)  Unspecified lack of coordination  Other muscle spasm  Other low back pain  Rationale for Evaluation and Treatment: Rehabilitation  ONSET DATE: chronic  SUBJECTIVE:                                                                                                                                                                                            SUBJECTIVE STATEMENT: Pt states that pelvic floor pain is much better and she has been working with wand on a regular basis. She has started having pinching pain at Rt SIJ that shoot around hip and down buttock. She is wondering if the position she is trying to use pelvic wand is irritating this.   Pt states that she feels like she is emptying bowel more completely, but she never fully empties - she feels like this is due to slow motility. She does feel like she is now emptying to the best of her ability.  *Lives with tethered spinal cord; neurogenic bladder  PAIN:  Are you having pain? Yes NPRS scale: 9/10 Rt SIJ, 0/10 pelvic floor pain Pain location: Rt ischial tuberosity with pain down lateral thigh to knee, Rt groin/anterior hip/abdomen, Rt low back, Rt side of tailbone; Rt SIJ   Pain type: aching, burning, dull, and stabbing  Pain description: constant (but level varies)   Aggravating factors: standing, sitting more than 30 mins Relieving factors: rest, muscle relaxer, taping, pelvic stability belt; popping, pillows for sleeping   PRECAUTIONS: Fall   WEIGHT BEARING RESTRICTIONS No   FALLS:  Has patient fallen in last 6 months? Yes. Number of falls 2   LIVING ENVIRONMENT: Lives with: lives with their family Lives in: House/apartment     OCCUPATION: disabled   PLOF: Independent   PATIENT GOALS to have less pain   PERTINENT HISTORY:  Myelomeningocele with surgical repair,  spina bifida, self-catheterizes for neurogenic bladder, Cervical cancer, HPV , recurrent  UTIs, HSV , chronic rt hip pain, melanoma sle of Rt foot has been excised but has foot sensory changes, cervical radiculopathy, trigeminal neuralgia, grade 1 carpal tunnel, left mild chronic C7 radiculopathy   Sexual abuse: No   BOWEL MOVEMENT Pain with bowel movement: No Type of bowel movement:Type (Bristol Stool Scale) type 1-2 usually, will have diarrhea intermittently but thinks a lot of this is more about constipation and low motility, Frequency empty bowels every time caths, and usually this is enough unless has diarrhea, and Strain No Fully empty rectum: No does not have urge regularly, does have increased pressure in low back.   Leakage: Yes: with diarrhea wears AAT Pads: Yes: pull ups AAT Fiber supplement: No   URINATION Pain with urination: No Fully empty bladder: No does self cath to empty Stream: self-cath Urgency: No has no urge to have urine void Frequency: self caths 6x per day Leakage:  none Pads: No   INTERCOURSE Pain with intercourse:  not painful internally but will have pain at Rt pelvis or hip Ability to have vaginal penetration:  Yes:   Climax: yes not painful Marinoff Scale: 0/3   PREGNANCY Vaginal deliveries 3 Tearing Yes: tearing with first with stiches, tearing with second C-section deliveries 0 Currently pregnant No   PROLAPSE None       OBJECTIVE:   12/16/22: PFIQ-7: 29  Posture: Increased thoracic kyphosis, Rt lumbar rotation, elevated Rt iliac crest, Rt sacral rotation,   Stork test: On Rt, Lt pelvic drop, stable on Lt  Lumbar A/ROM: decreased lateral flexion to Lt by 50%, no movement in Rt SIJ with pain  Pelvic alignment: Rt posterior innominate rotation  Palpation: significant trigger points in Rt glutes  Functional test: -Squat: bil knee valgus, Rt SIJ pain, breath holding  11/18/22 Significant Lt anterior pelvic rotation/posterior Rt rotation still present  PFIQ-7: 77  MMT eval  Vaginal 1-2/5 with coordination still improved  and less effort, equal bil  (Blank rows = not tested)  PALPATION: Presence of cyst in vaginal opening (pt reports 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); tenderness throughout Rt pelvic floor with pai nreproduction   10/07/22: LUMBAR AROM/PROM   A/PROM A/PROM  (% available)  Flexion WFL  Extension 75 with pain Rt  Right lateral flexion 75, pain with compression on Rt  Left lateral flexion 50 limited by Rt side  Right rotation WNL  Left rotation 75 limtied by Rt side   LOWER EXTREMITY MMT:   MMT Right Eval All out of 5 Left Eval All out of 5  Hip flexion 2 4  Hip extension 3 4  Hip abduction 2+ 4  Hip adduction 3 4  Hip internal rotation  4 4  Hip external rotation  4 4  Knee flexion 3+ 4+  Knee extension 2+ 4+  Ankle dorsiflexion 0 4  Ankle plantarflexion 1+ 4  Ankle inversion 1+ 4  Ankle eversion 1+ 4    External Perineal Exam: no light touch, can feel deep pressure over Rt labia               Internal Pelvic Floor :  internal vaginal assessment only performed; decrease sensation throughout Rt superficial/deep pelvic floor; does have sensation over Rt obturator internus   Patient confirms identification and approves PT to assess internal pelvic floor and treatment Yes   PELVIC MMT:   MMT eval  Vaginal 1-2/5  with better coordination and less effort, equal bil  (Blank rows = not tested)  09/03/22 External Perineal Exam: no sensation Rt labia               Internal Pelvic Floor :  no sensation/decreased sensation superficial/deep pelvic floor - more sensation with palpation closer to coccyx vaginally, no sensation palpated on or lateral to coccyx rectally; significant hypermobility of coccyx; 100% pain reduction in low back/coccyx with posterior pressure on Rt coccyx; increase in low back pain with posterior pressure on Lt coccyx; spasm in Rt OI   Patient confirms identification and approves PT to assess internal pelvic floor and treatment Yes-next session    PELVIC  MMT:   MMT eval  Vaginal 1/5 with significant effort   Internal Anal Sphincter  0/5  External Anal Sphincter 0/5 Rt, 1/5 Lt   Puborectalis  1/5  Diastasis Recti    (Blank rows = not tested)         TONE: Low, decreased more on Rt than Lt   PROLAPSE: Grade 2 anterior vaginal wall laxity  08/20/22  PATIENT SURVEYS:  PFIQ-7 133     COGNITION:            Overall cognitive status: Within functional limits for tasks assessed                          SENSATION:            Light touch: Deficits Rt flank down to toes has decreased sensation, decreased sensation at external genitalis             Proprioception: Deficits Rt side decreased    MUSCLE LENGTH: Rt hamstring and adductor limited by 50%, Lt 25%  GAIT: Comments: decreased mobility at Rt LE, foot drop Rt foot, decreased flexion of Rt knee, hip circumduction noted with gait at Rt, decreased cadence   POSTURE: rounded shoulders, forward head, and posterior pelvic tilt; Lt posterior/Rt anterior rotation; Slight Lt iliac crest elevation  FUNCTIONAL TASK: Lunge: bil instability present, pain with Rt   LUMBARAROM/PROM   A/PROM A/PROM  Eval (% available)  Flexion WFL  Extension 50 with pain Rt  Right lateral flexion WNL  Left lateral flexion 50 limited by Rt side  Right rotation WNL  Left rotation 50 limtied by Rt side   (Blank rows = not tested)   LOWER EXTREMITY ROM:   WFL but tension felt pt pain at Rt side   LOWER EXTREMITY MMT:   MMT Right Eval All out of 5 Left Eval All out of 5  Hip flexion 2 4  Hip extension 3 4  Hip abduction 2+ 4  Hip adduction 3 4  Hip internal rotation  4 4  Hip external rotation  4 4  Knee flexion 3+ 4+  Knee extension 2+ 4+  Ankle dorsiflexion 0 4  Ankle plantarflexion 1+ 4  Ankle inversion 1+ 4  Ankle eversion 1+ 4    PALPATION: General: Pain throughout Rt lower quadrant, flank, low back; significant scar tissue restriction across low back/sacrum       TODAY'S  TREATMENT  12/16/22 Manual: Trigger Point Dry-Needling  Treatment instructions: Expect mild to moderate muscle soreness. S/S of pneumothorax if dry needled over a lung field, and to seek immediate medical attention should they occur. Patient verbalized understanding of these instructions and education.  Patient Consent Given: Yes Education handout provided: Previously provided Muscles treated: Rt glutes Electrical stimulation performed:  No Parameters: N/A Treatment response/outcome: twitch response and release Soft tissue mobilization Rt glutes Side lying lumbopelvic mobilizations  Neuromuscular re-education: MET for Rt posterior/Lt anterior rotation Shotgun technique  12/09/22 Manual: Pt provides verbal consent for internal vaginal/rectal pelvic floor exam. Internal vaginal Rt sided pelvic floor muscle release Therapeutic activities: Pelvic floor wand training in session   11/25/22 Manual: Myofascial release low back Lumbar paraspinal/glute soft tissue mobilization Exercises: Cat cow 2 x 10 Cat cow to child's pose 12x 3-way kick, 10x each, bil Kneeling hip flexor/adductor/QL stretch 2 min bil   PATIENT EDUCATION:  Education details: See above Person educated: Patient Education method: Explanation, Demonstration, Tactile cues, Verbal cues, and Handouts Education comprehension: verbalized understanding and returned demonstration     HOME EXERCISE PROGRAM: [id number]   ASSESSMENT:   CLINICAL IMPRESSION: Pt has seen varying progress with pain management in the last several months. She has purchased squatty potty and pelvic floor muscle wand and has been using on a regular basis, which both appear to have positive impact on pain management, allowing her to more completely evacuate bowels and reduce muscular tension in pelvic floor. She has had several exacerbating factors, such as chronic UTI and insect bites, that has made it difficult for her to manage her pain and will  increase tension. She came in with 0/10 pelvic floor pain today, but significant Rt SIJ pain. She demonstrate continued pelvic rotation, but not as severe as it has been in the past (Rt posterior/Lt anterior with Rt lumbar rotation); she had reduced ability to laterally flex Lt with decreased mobility at Rt SIJ. In the last several visits she has been improving ability to actively contract pelvic floor more consistently, but not specifically evaluated this treatment session. She presents with more thoracic kyphosis, which could be contributing to increased vaginal pressure; she will benefit from postural retraining as we are able to perform more functional strengthening activities now that pain levels may be lowering. Notable trigger points in Rt glutes today adding to the pain; dry needling and manual techniques very helpful at reducing this today. It is reasonable to assume that with her level of pelvic instability, spasm and trigger points in this area may be exacerbating rotation. She is making progress with pain levels and functional ability; she is working hard on compliance with HEP and pain management techniques. She will continue to benefit from skilled PT intervention in order to perform pain management in lifelong condition of spina bifida/tethered core and neurogenic bladder, progress functional strengthening program, and improve quality of life.      OBJECTIVE IMPAIRMENTS decreased activity tolerance, decreased coordination, decreased endurance, decreased mobility, difficulty walking, decreased strength, increased fascial restrictions, increased muscle spasms, impaired flexibility, improper body mechanics, postural dysfunction, and pain.    ACTIVITY LIMITATIONS carrying, lifting, sitting, standing, squatting, stairs, continence, toileting, and locomotion level   PARTICIPATION LIMITATIONS: meal prep, cleaning, interpersonal relationship, driving, shopping, community activity, and yard work    PERSONAL FACTORS Past/current experiences, Time since onset of injury/illness/exacerbation, and 3+ comorbidities: medical history  are also affecting patient's functional outcome.    REHAB POTENTIAL: Good   CLINICAL DECISION MAKING: Evolving/moderate complexity   EVALUATION COMPLEXITY: Moderate     GOALS: Goals reviewed with patient? Yes   SHORT TERM GOALS: Target date: 09/24/22 - updated 10/07/22 - 11/04/22 - updated 11/18/22 - updated 9/3/4   Pt to be I with HEP.  Baseline: Goal status: MET 10/07/22   2.  Pt will report 25% reduction of pain due  to improvements in posture, strength, and muscle length  Baseline: She reports that on average she feels better 25% of the time, but there are still days in which pain is very high, like today Goal status: MET 11/18/22   3.  Pt will report her BMs and bladder voids are complete due to improved bowel habits and evacuation techniques.  Baseline: She states that she is now able to completely empty bladder, but bowel is not emptying completely even with splinting techniques Goal status: IN PROGRESS     LONG TERM GOALS: Target date: 02/04/23 - updated 10/07/22 - updated 11/04/22 - updated 11/18/22 - updated 12/16/22   Pt to be I with advanced HEP.  Baseline:  Goal status: IN PROGRESS   2.  Pt will report 50% reduction of pain due to improvements in posture, strength, and muscle length  Baseline:  Goal status: IN PROGRESS   3.  Pt to be I with pelvic wand/dilator use for pain management outside of PT.  Baseline: Pt working with dilators for pain reduction - believe she needs pelvic wand Goal status: MET 11/18/22   4.  Pt to be I with abdominal massage, voiding mechanics, breathing mechanics for improved pelvic relaxation and decreased pain.  Baseline:  Goal status: MET 12/16/22   5.  Pt to demonstrated improved coordination of pelvic floor and breathing mechanics with functional squat with no increase in pain. Baseline: increase in pain at Rt SIJ  12/16/22 Goal status: IN PROGRESS   6.  Pt to demonstrate at least 3/5 pelvic floor strength and ability to fully relax post contraction for improved pelvic stability and decreased strain at pelvic floor. Baseline: improved to 1-2/5 Goal status: IN PROGRESS   PLAN: PT FREQUENCY: 1-2x/week   PT DURATION:  6 months   PLANNED INTERVENTIONS: Therapeutic exercises, Therapeutic activity, Neuromuscular re-education, Patient/Family education, Self Care, Joint mobilization, Aquatic Therapy, Dry Needling, Spinal mobilization, Cryotherapy, Moist heat, scar mobilization, Taping, Vasopneumatic device, Biofeedback, and Manual therapy   PLAN FOR NEXT SESSION: Pelvic stability training; manual techniques for pain management.  Julio Alm, PT, DPT09/03/241:15 PM

## 2022-12-18 ENCOUNTER — Encounter: Payer: Self-pay | Admitting: Dermatology

## 2022-12-18 ENCOUNTER — Ambulatory Visit: Payer: Medicaid Other | Admitting: Cardiology

## 2022-12-18 ENCOUNTER — Ambulatory Visit: Payer: Medicaid Other | Admitting: Dermatology

## 2022-12-18 VITALS — BP 114/74 | HR 68

## 2022-12-18 DIAGNOSIS — S80861A Insect bite (nonvenomous), right lower leg, initial encounter: Secondary | ICD-10-CM | POA: Diagnosis not present

## 2022-12-18 DIAGNOSIS — S40861A Insect bite (nonvenomous) of right upper arm, initial encounter: Secondary | ICD-10-CM

## 2022-12-18 DIAGNOSIS — W57XXXA Bitten or stung by nonvenomous insect and other nonvenomous arthropods, initial encounter: Secondary | ICD-10-CM

## 2022-12-18 DIAGNOSIS — S80862A Insect bite (nonvenomous), left lower leg, initial encounter: Secondary | ICD-10-CM

## 2022-12-18 DIAGNOSIS — S40862A Insect bite (nonvenomous) of left upper arm, initial encounter: Secondary | ICD-10-CM | POA: Diagnosis not present

## 2022-12-18 DIAGNOSIS — S80869A Insect bite (nonvenomous), unspecified lower leg, initial encounter: Secondary | ICD-10-CM

## 2022-12-18 MED ORDER — CLOBETASOL PROPIONATE 0.05 % EX CREA
TOPICAL_CREAM | CUTANEOUS | 1 refills | Status: AC
Start: 2022-12-18 — End: ?

## 2022-12-18 MED ORDER — DOXYCYCLINE HYCLATE 100 MG PO TABS
100.0000 mg | ORAL_TABLET | Freq: Two times a day (BID) | ORAL | 0 refills | Status: AC
Start: 2022-12-18 — End: 2023-01-17

## 2022-12-18 NOTE — Patient Instructions (Signed)
Take Doxycycline twice daily with food until finished.   Start Clobetasol cream twice daily until resolved. Avoid applying to face, groin, and axilla. Use as directed. Long-term use can cause thinning of the skin.   Doxycycline should be taken with food to prevent nausea. Do not lay down for 30 minutes after taking. Be cautious with sun exposure and use good sun protection while on this medication. Pregnant women should not take this medication.    Topical steroids (such as triamcinolone, fluocinolone, fluocinonide, mometasone, clobetasol, halobetasol, betamethasone, hydrocortisone) can cause thinning and lightening of the skin if they are used for too long in the same area. Your physician has selected the right strength medicine for your problem and area affected on the body. Please use your medication only as directed by your physician to prevent side effects.    Due to recent changes in healthcare laws, you may see results of your pathology and/or laboratory studies on MyChart before the doctors have had a chance to review them. We understand that in some cases there may be results that are confusing or concerning to you. Please understand that not all results are received at the same time and often the doctors may need to interpret multiple results in order to provide you with the best plan of care or course of treatment. Therefore, we ask that you please give [place] 2 business days to thoroughly review all your results before contacting the office for clarification. Should we see a critical lab result, you will be contacted sooner.   If You Need Anything After Your Visit  If you have any questions or concerns for your doctor, please call our main line at [phone] and press option 4 to reach your doctor's medical assistant. If no one answers, please leave a voicemail as directed and we will return your call as soon as possible. Messages left after 4 pm will be answered the following business day.    You may also send [place] a message via MyChart. We typically respond to MyChart messages within 1-2 business days.  For prescription refills, please ask your pharmacy to contact our office. Our fax number is [phone].  If you have an urgent issue when the clinic is closed that cannot wait until the next business day, you can page your doctor at the number below.    Please note that while we do our best to be available for urgent issues outside of office hours, we are not available 24/7.   If you have an urgent issue and are unable to reach [place], you may choose to seek medical care at your doctor's office, retail clinic, urgent care center, or emergency room.  If you have a medical emergency, please immediately call 911 or go to the emergency department.  Pager Numbers  - Dr. Gwen Pounds: [phone]  - Dr. Roseanne Reno: [phone]  - Dr. Katrinka Blazing: [phone]   In the event of inclement weather, please call our main line at [phone] for an update on the status of any delays or closures.  Dermatology Medication Tips: Please keep the boxes that topical medications come in in order to help keep track of the instructions about where and how to use these. Pharmacies typically print the medication instructions only on the boxes and not directly on the medication tubes.   If your medication is too expensive, please contact our office at [phone] option 4 or send [place] a message through MyChart.   We are unable to tell what your co-pay for medications  will be in advance as this is different depending on your insurance coverage. However, we may be able to find a substitute medication at lower cost or fill out paperwork to get insurance to cover a needed medication.   If a prior authorization is required to get your medication covered by your insurance company, please allow [place] 1-2 business days to complete this process.  Drug prices often vary depending on where the prescription is filled and  some pharmacies may offer cheaper prices.  The website www.goodrx.com contains coupons for medications through different pharmacies. The prices here do not account for what the cost may be with help from insurance (it may be cheaper with your insurance), but the website can give you the price if you did not use any insurance.  - You can print the associated coupon and take it with your prescription to the pharmacy.  - You may also stop by our office during regular business hours and pick up a GoodRx coupon card.  - If you need your prescription sent electronically to a different pharmacy, notify our office through [facility] or by phone at [phone] option 4.     Si Usted Necesita Algo Despus de Su Visita  Tambin puede enviarnos un mensaje a travs de Clinical cytogeneticist. Por lo general respondemos a los mensajes de MyChart en el transcurso de 1 a 2 das hbiles.  Para renovar recetas, por favor pida a su farmacia que se ponga en contacto con nuestra oficina. Annie Sable de fax es [place] [phone].  Si tiene un asunto urgente cuando la clnica est cerrada y que no puede esperar hasta el siguiente da hbil, puede llamar/localizar a su doctor(a) al nmero que aparece a continuacin.   Por favor, tenga en cuenta que aunque hacemos todo lo posible para estar disponibles para asuntos urgentes fuera del horario de [place], no estamos disponibles las 24 horas del da, los 7 [address]  [address] de la [place].   Si tiene un problema urgente y no puede comunicarse con nosotros, puede optar por buscar atencin mdica  en el consultorio de su doctor(a), en una clnica privada, en un centro de atencin urgente o en una sala de emergencias.  Si tiene Engineer, drilling, por favor llame inmediatamente al 911 o vaya a la sala de emergencias.  Nmeros de bper  - Dr. Gwen Pounds: [phone]  - Dra. Roseanne Reno: [phone]  - Dr. Katrinka Blazing: [phone]   En caso de inclemencias del tiempo, por favor llame a Lacy Duverney principal al [phone] para una actualizacin sobre el [place] de cualquier retraso o cierre.  Consejos para la medicacin en dermatologa: Por favor, guarde las cajas en las que vienen los medicamentos de uso tpico para ayudarle a seguir las instrucciones sobre dnde y cmo usarlos. Las farmacias generalmente imprimen las instrucciones del medicamento slo en las cajas y no directamente en los tubos del [place].   Si su medicamento es muy caro, por favor, pngase en contacto con Rolm Gala llamando al [phone] y presione la opcin 4 o envenos un mensaje a travs de Clinical cytogeneticist.   No podemos decirle cul ser su copago por los medicamentos por adelantado ya que esto es diferente dependiendo de la cobertura de su seguro. Sin embargo, es posible que podamos encontrar un medicamento sustituto a Audiological scientist un formulario para que el seguro cubra el medicamento que se considera necesario.   Si se requiere una autorizacin previa para que su compaa de seguros [place] su medicamento, por favor permtanos  de 1 a 2 das hbiles para completar [address]  Los precios de los medicamentos varan con frecuencia dependiendo del Environmental consultant de dnde se surte la receta y alguna farmacias pueden ofrecer precios ms baratos.  El sitio web www.goodrx.com tiene cupones para medicamentos de Health and safety inspector. Los precios aqu no tienen en cuenta lo que podra costar con la ayuda del seguro (puede ser ms barato con su seguro), pero el sitio web puede darle el precio si no utiliz Tourist information centre manager.  - Puede imprimir el cupn correspondiente y llevarlo con su receta a la farmacia.  - Tambin puede pasar por nuestra oficina durante el horario de atencin regular y Education officer, museum una tarjeta de cupones de GoodRx.  - Si necesita que su receta se enve electrnicamente a una farmacia diferente, informe a nuestra oficina a travs de MyChart de [place] o por telfono llamando al [phone] y presione la  opcin 4.

## 2022-12-18 NOTE — Progress Notes (Signed)
   Follow-Up Visit   Subjective  Melissa Pruitt is a 47 y.o. female who presents for the following: tick bites, scattered body. Got into a tick nest 12/03/2022. Allergist has ordered an Alpha-gal test and prescribed hydroxyzine 10 mg and mometasone ointment.  Would like to know if we can test for RMSF and Lyme disease. 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 Patient brought them in a bottle Would like Rx for Mupirocin cream.  The patient has spots, moles and lesions to be evaluated, some may be new or changing and the patient may have concern these could be cancer.    The following portions of the chart were reviewed this encounter and updated as appropriate: medications, allergies, medical history  Review of Systems:  No other skin or systemic complaints except as noted in HPI or Assessment and Plan.  Objective  Well appearing patient in no apparent distress; mood and affect are within normal limits.  A focused examination was performed of the following areas: Arms, legs, torso  Relevant physical exam findings are noted in the Assessment and Plan.  scattered body Scattered pink papules and excoriations with inflammatory areolae on extremities and torso    Assessment & Plan   Tick bite, unspecified site, initial encounter scattered body  -Discussed that prior positive RMSF titers may invalidate future testing. May be unable to interpret positive results. -Sending RMSF and lyme disease labs. Will repeat depending on results -Ticks were on patient for up to 48 hours, meaning infection could have occurred -Given that patient cannot reliably monitor for symptoms of tick-borne illness, empirically treating. Prolonged course was required last time -Start Doxycycline twice daily with food until finished.  -Start Clobetasol cream twice daily to arthropod bites until  pruritus resolves. Avoid applying to face, groin, and axilla. Use as directed. Long-term use can cause thinning of the skin.   Doxycycline should be taken with food to prevent nausea. Do not lay down for 30 minutes after taking. Be cautious with sun exposure and use good sun protection while on this medication. Pregnant women should not take this medication.    Topical steroids (such as triamcinolone, fluocinolone, fluocinonide, mometasone, clobetasol, halobetasol, betamethasone, hydrocortisone) can cause thinning and lightening of the skin if they are used for too long in the same area. Your physician has selected the right strength medicine for your problem and area affected on the body. Please use your medication only as directed by your physician to prevent side effects.    Related Procedures Lyme Disease Serology w/Reflex  Related Medications doxycycline (VIBRA-TABS) 100 MG tablet Take 1 tablet (100 mg total) by mouth 2 (two) times daily. Take with food  clobetasol cream (TEMOVATE) 0.05 % Apply twice daily to bites until resolved. Avoid applying to face, groin, and axilla.  Tick bite of lower leg, unspecified laterality, initial encounter  Related Procedures Spotted Fever Group Antibodies     Return for TBSE As Scheduled.  I, Lawson Radar, CMA, am acting as scribe for Elie Goody, MD.   Documentation: I have reviewed the above documentation for accuracy and completeness, and I agree with the above.  Elie Goody, MD

## 2022-12-23 ENCOUNTER — Ambulatory Visit: Payer: Medicaid Other

## 2022-12-23 DIAGNOSIS — R293 Abnormal posture: Secondary | ICD-10-CM

## 2022-12-23 DIAGNOSIS — M6281 Muscle weakness (generalized): Secondary | ICD-10-CM

## 2022-12-23 DIAGNOSIS — R279 Unspecified lack of coordination: Secondary | ICD-10-CM

## 2022-12-23 DIAGNOSIS — M5459 Other low back pain: Secondary | ICD-10-CM

## 2022-12-23 DIAGNOSIS — M62838 Other muscle spasm: Secondary | ICD-10-CM

## 2022-12-23 DIAGNOSIS — R102 Pelvic and perineal pain: Secondary | ICD-10-CM | POA: Diagnosis not present

## 2022-12-23 NOTE — Therapy (Signed)
OUTPATIENT PHYSICAL THERAPY FEMALE PELVIC TREATMENT   Patient Name: Melissa Pruitt MRN: [id number] DOB:15-Dec-1975, 47 y.o., female Today's Date: 12/23/2022  END OF SESSION:  PT End of Session - 12/23/22 1104     Visit Number 14    Date for PT Re-Evaluation 02/04/23    Authorization Type [employer]    Authorization Time Period 12/18/22-01/16/23    Authorization - Visit Number 1    Authorization - Number of Visits 4    PT Start Time 1101    PT Stop Time 1141    PT Time Calculation (min) 40 min    Activity Tolerance Patient tolerated treatment well    Behavior During Therapy [facility] for tasks assessed/performed                   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   R mid sole superior moderate atypia    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   R med sole inferior moderate atypia    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   R dorsum lat great toe   Dysplastic nevus 06/11/2020   left prox lat thigh sup, - severe   Dysplastic nevus 06/11/2020   left prox lat thigh inf, - moderate   Dysplastic nevus 06/11/2020   R mid to upper back 2.0cm lat to spine, Severe atypia   Dysplastic nevus 06/11/2020   R distal post thigh, moderate atypia   Dysplastic nevus 08/23/2020   Right labia - mild   Dysplastic nevus 08/12/2021   R lat neck post, moderate atypia   Dysplastic nevus 06/11/2012   L dorsal foot, mild atypia, bx from Dr. Larey Dresser   History of recurrent UTIs    HPV (human papilloma virus) infection    HSV (herpes simplex virus) infection    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[facility])    sacro/coccygeal mass excision   Melanoma (HCC) 02/16/2020   R mid plantar sole, BRESLOW'S DEPTH/MAXIMUM TUMOR THICKNESS: 0.5 MM, CLARK/ANATOMIC LEVEL: II Excised 03/20/2020   Normocytic anemia 08/21/2014   Tethered spinal cord (HCC)    Vitamin B 12 deficiency 08/21/2014   Past Surgical  History:  Procedure Laterality Date   BACK SURGERY     BLADDER SURGERY     COLOSTOMY     after flex sig as 47 year old.    colostomy reversal     EXPLORATORY LAPAROTOMY     with colostomy placement   LEEP     removal of cervical cancer     TUBAL LIGATION     Patient Active Problem List   Diagnosis Date Noted   Bacteriuria 12/10/2022   Allergic urticaria 07/02/2020   Food allergy 07/02/2020   Tendinitis of knee 07/02/2020   Vasomotor rhinitis 07/02/2020   Melanoma (HCC) 07/02/2020   Pain in joint of left shoulder 11/08/2019   Cervical radiculopathy 05/24/2019   Trigeminal neuralgia 11/05/2018   Symptomatic states associated with artificial menopause 11/05/2018   Arthralgia of hand, right 11/17/2017   Vitamin D deficiency 02/18/2017   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   Other specified disorders of uterus 10/12/2015   Herpes simplex 06/27/2015   Hyperlipidemia 05/30/2015   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   Normocytic anemia 08/21/2014   Vitamin B 12 deficiency 08/21/2014   Allergic rhinitis 01/19/2014   Condyloma acuminatum 01/09/2014   Chronic pain 08/09/2013   Cervical dysplasia 05/09/2013   Other  headache syndrome 05/09/2013   Arthralgia of temporomandibular joint 05/09/2013   Cervical pain 08/25/2012   Pain in joint, pelvic region and thigh 07/16/2012   Tethered spinal cord (HCC) 07/16/2012   Balance disorder 07/16/2012   Personal history of fall 07/16/2012   Neurogenic bladder 06/25/2011   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   OTHER SPECIFIED CONGENITAL ANOMALY SPINAL CORD 05/08/2010   UNSPEC CONGN ANOMALY BRAIN SP CORD&NERV SYSTEM 05/08/2010   H N P-LUMBAR 04/23/2010   BACK PAIN 01/28/2010   Papanicolaou smear of cervix with atypical squamous cells cannot exclude high grade squamous intraepithelial lesion (ASC-H) 04/14/1898    PCP: Orson Eva, NP  REFERRING PROVIDER: Lonell Face, MD  REFERRING DIAG: R10.2 Pelvic pain  THERAPY DIAG:  Pelvic  pain  Abnormal posture  Muscle weakness (generalized)  Unspecified lack of coordination  Other muscle spasm  Other low back pain  Rationale for Evaluation and Treatment: Rehabilitation  ONSET DATE: chronic  SUBJECTIVE:                                                                                                                                                                                            SUBJECTIVE STATEMENT: Pt states that she is still at 30-40 minute limit for riding in the car before Rt LE goes numb. She states that last session was very helpful.   *Lives with tethered spinal cord; neurogenic bladder  PAIN:  Are you having pain? Yes NPRS scale: 7/10 Rt SIJ, 0/10 pelvic floor twitch (Lt pelvic spasm) Pain location: Rt ischial tuberosity with pain down lateral thigh to knee, Rt groin/anterior hip/abdomen, Rt low back, Rt side of tailbone; Rt SIJ   Pain type: aching, burning, dull, and stabbing  Pain description: constant (but level varies)   Aggravating factors: standing, sitting more than 30 mins Relieving factors: rest, muscle relaxer, taping, pelvic stability belt; popping, pillows for sleeping   PRECAUTIONS: Fall   WEIGHT BEARING RESTRICTIONS No   FALLS:  Has patient fallen in last 6 months? Yes. Number of falls 2   LIVING ENVIRONMENT: Lives with: lives with their family Lives in: House/apartment     OCCUPATION: disabled   PLOF: Independent   PATIENT GOALS to have less pain   PERTINENT HISTORY:  Myelomeningocele with surgical repair,  spina bifida, self-catheterizes for neurogenic bladder, Cervical cancer, HPV , recurrent UTIs, HSV , chronic rt hip pain, melanoma sle of Rt foot has been excised but has foot sensory changes, cervical radiculopathy, trigeminal neuralgia, grade 1 carpal tunnel, left mild chronic C7 radiculopathy   Sexual abuse: No   BOWEL MOVEMENT Pain with bowel movement:  No Type of bowel movement:Type (Bristol Stool Scale)  type 1-2 usually, will have diarrhea intermittently but thinks a lot of this is more about constipation and low motility, Frequency empty bowels every time caths, and usually this is enough unless has diarrhea, and Strain No Fully empty rectum: No does not have urge regularly, does have increased pressure in low back.   Leakage: Yes: with diarrhea wears AAT Pads: Yes: pull ups AAT Fiber supplement: No   URINATION Pain with urination: No Fully empty bladder: No does self cath to empty Stream: self-cath Urgency: No has no urge to have urine void Frequency: self caths 6x per day Leakage:  none Pads: No   INTERCOURSE Pain with intercourse:  not painful internally but will have pain at Rt pelvis or hip Ability to have vaginal penetration:  Yes:   Climax: yes not painful Marinoff Scale: 0/3   PREGNANCY Vaginal deliveries 3 Tearing Yes: tearing with first with stiches, tearing with second C-section deliveries 0 Currently pregnant No   PROLAPSE None       OBJECTIVE:   12/16/22: PFIQ-7: 29  Posture: Increased thoracic kyphosis, Rt lumbar rotation, elevated Rt iliac crest, Rt sacral rotation,   Stork test: On Rt, Lt pelvic drop, stable on Lt  Lumbar A/ROM: decreased lateral flexion to Lt by 50%, no movement in Rt SIJ with pain  Pelvic alignment: Rt posterior innominate rotation  Palpation: significant trigger points in Rt glutes  Functional test: -Squat: bil knee valgus, Rt SIJ pain, breath holding  11/18/22 Significant Lt anterior pelvic rotation/posterior Rt rotation still present  PFIQ-7: 77  MMT eval  Vaginal 1-2/5 with coordination still improved and less effort, equal bil  (Blank rows = not tested)  PALPATION: Presence of cyst in vaginal opening (pt reports 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); tenderness throughout Rt pelvic floor with pai nreproduction   10/07/22: LUMBAR AROM/PROM   A/PROM A/PROM  (% available)  Flexion WFL  Extension 75 with pain Rt  Right  lateral flexion 75, pain with compression on Rt  Left lateral flexion 50 limited by Rt side  Right rotation WNL  Left rotation 75 limtied by Rt side   LOWER EXTREMITY MMT:   MMT Right Eval All out of 5 Left Eval All out of 5  Hip flexion 2 4  Hip extension 3 4  Hip abduction 2+ 4  Hip adduction 3 4  Hip internal rotation  4 4  Hip external rotation  4 4  Knee flexion 3+ 4+  Knee extension 2+ 4+  Ankle dorsiflexion 0 4  Ankle plantarflexion 1+ 4  Ankle inversion 1+ 4  Ankle eversion 1+ 4    External Perineal Exam: no light touch, can feel deep pressure over Rt labia               Internal Pelvic Floor :  internal vaginal assessment only performed; decrease sensation throughout Rt superficial/deep pelvic floor; does have sensation over Rt obturator internus   Patient confirms identification and approves PT to assess internal pelvic floor and treatment Yes   PELVIC MMT:   MMT eval  Vaginal 1-2/5 with better coordination and less effort, equal bil  (Blank rows = not tested)  09/03/22 External Perineal Exam: no sensation Rt labia               Internal Pelvic Floor :  no sensation/decreased sensation superficial/deep pelvic floor - more sensation with palpation closer to coccyx vaginally, no sensation palpated on or  lateral to coccyx rectally; significant hypermobility of coccyx; 100% pain reduction in low back/coccyx with posterior pressure on Rt coccyx; increase in low back pain with posterior pressure on Lt coccyx; spasm in Rt OI   Patient confirms identification and approves PT to assess internal pelvic floor and treatment Yes-next session    PELVIC MMT:   MMT eval  Vaginal 1/5 with significant effort   Internal Anal Sphincter  0/5  External Anal Sphincter 0/5 Rt, 1/5 Lt   Puborectalis  1/5  Diastasis Recti    (Blank rows = not tested)         TONE: Low, decreased more on Rt than Lt   PROLAPSE: Grade 2 anterior vaginal wall laxity  08/20/22  PATIENT  SURVEYS:  PFIQ-7 133     COGNITION:            Overall cognitive status: Within functional limits for tasks assessed                          SENSATION:            Light touch: Deficits Rt flank down to toes has decreased sensation, decreased sensation at external genitalis             Proprioception: Deficits Rt side decreased    MUSCLE LENGTH: Rt hamstring and adductor limited by 50%, Lt 25%  GAIT: Comments: decreased mobility at Rt LE, foot drop Rt foot, decreased flexion of Rt knee, hip circumduction noted with gait at Rt, decreased cadence   POSTURE: rounded shoulders, forward head, and posterior pelvic tilt; Lt posterior/Rt anterior rotation; Slight Lt iliac crest elevation  FUNCTIONAL TASK: Lunge: bil instability present, pain with Rt   LUMBARAROM/PROM   A/PROM A/PROM  Eval (% available)  Flexion WFL  Extension 50 with pain Rt  Right lateral flexion WNL  Left lateral flexion 50 limited by Rt side  Right rotation WNL  Left rotation 50 limtied by Rt side   (Blank rows = not tested)   LOWER EXTREMITY ROM:   WFL but tension felt pt pain at Rt side   LOWER EXTREMITY MMT:   MMT Right Eval All out of 5 Left Eval All out of 5  Hip flexion 2 4  Hip extension 3 4  Hip abduction 2+ 4  Hip adduction 3 4  Hip internal rotation  4 4  Hip external rotation  4 4  Knee flexion 3+ 4+  Knee extension 2+ 4+  Ankle dorsiflexion 0 4  Ankle plantarflexion 1+ 4  Ankle inversion 1+ 4  Ankle eversion 1+ 4    PALPATION: General: Pain throughout Rt lower quadrant, flank, low back; significant scar tissue restriction across low back/sacrum       TODAY'S TREATMENT  12/21/22 Manual: Trigger Point Dry-Needling  Treatment instructions: Expect mild to moderate muscle soreness. S/S of pneumothorax if dry needled over a lung field, and to seek immediate medical attention should they occur. Patient verbalized understanding of these instructions and education.  Patient Consent  Given: Yes Education handout provided: Previously provided Muscles treated: Rt glutes Electrical stimulation performed: No Parameters: N/A Treatment response/outcome: twitch response and release Soft tissue mobilization Rt glutes Side lying lumbopelvic mobilizations  External Lt OI release in Lt sidelying Instrument assisted soft tissue mobilization to Rt ilaic crest Exercises: Single leg bridge 5x bil Bent hip extensions in standing 10x bil Bent fire hydrant in standing 10x bil   12/16/22 Manual: Trigger Point Dry-Needling  Treatment instructions: Expect mild to moderate muscle soreness. S/S of pneumothorax if dry needled over a lung field, and to seek immediate medical attention should they occur. Patient verbalized understanding of these instructions and education.  Patient Consent Given: Yes Education handout provided: Previously provided Muscles treated: Rt glutes Electrical stimulation performed: No Parameters: N/A Treatment response/outcome: twitch response and release Soft tissue mobilization Rt glutes Side lying lumbopelvic mobilizations  Neuromuscular re-education: MET for Rt posterior/Lt anterior rotation Shotgun technique  12/09/22 Manual: Pt provides verbal consent for internal vaginal/rectal pelvic floor exam. Internal vaginal Rt sided pelvic floor muscle release Therapeutic activities: Pelvic floor wand training in session     PATIENT EDUCATION:  Education details: See above Person educated: Patient Education method: Programmer, multimedia, Demonstration, Tactile cues, Verbal cues, and Handouts Education comprehension: verbalized understanding and returned demonstration     HOME EXERCISE PROGRAM: [id number]   ASSESSMENT:   CLINICAL IMPRESSION: Believe that Lt OI may be responsible for "twitch" that she is feeling in LT pelvic floor. She tolerated external release to Lt OI with reproduction of pain with release. She did well with DN and soft tissue mobilization  to Rt glutes as well with significant twitch response. She also continued to do well with sidelying lumbar mobilizations. She brought Graston tool that she bought and she was educated on how to use to help with Rt hip pain. Glute strengthening focused on this session with some cramping and tightness in hamstrings, but overall good tolerance. She will continue to benefit from skilled PT intervention in order to perform pain management in lifelong condition of spina bifida/tethered core and neurogenic bladder, progress functional strengthening program, and improve quality of life.      OBJECTIVE IMPAIRMENTS decreased activity tolerance, decreased coordination, decreased endurance, decreased mobility, difficulty walking, decreased strength, increased fascial restrictions, increased muscle spasms, impaired flexibility, improper body mechanics, postural dysfunction, and pain.    ACTIVITY LIMITATIONS carrying, lifting, sitting, standing, squatting, stairs, continence, toileting, and locomotion level   PARTICIPATION LIMITATIONS: meal prep, cleaning, interpersonal relationship, driving, shopping, community activity, and yard work   PERSONAL FACTORS Past/current experiences, Time since onset of injury/illness/exacerbation, and 3+ comorbidities: medical history  are also affecting patient's functional outcome.    REHAB POTENTIAL: Good   CLINICAL DECISION MAKING: Evolving/moderate complexity   EVALUATION COMPLEXITY: Moderate     GOALS: Goals reviewed with patient? Yes   SHORT TERM GOALS: Target date: 09/24/22 - updated 10/07/22 - 11/04/22 - updated 11/18/22 - updated 9/3/4   Pt to be I with HEP.  Baseline: Goal status: MET 10/07/22   2.  Pt will report 25% reduction of pain due to improvements in posture, strength, and muscle length  Baseline: She reports that on average she feels better 25% of the time, but there are still days in which pain is very high, like today Goal status: MET 11/18/22   3.  Pt  will report her BMs and bladder voids are complete due to improved bowel habits and evacuation techniques.  Baseline: She states that she is now able to completely empty bladder, but bowel is not emptying completely even with splinting techniques Goal status: IN PROGRESS     LONG TERM GOALS: Target date: 02/04/23 - updated 10/07/22 - updated 11/04/22 - updated 11/18/22 - updated 12/16/22   Pt to be I with advanced HEP.  Baseline:  Goal status: IN PROGRESS   2.  Pt will report 50% reduction of pain due to improvements in posture, strength, and muscle length  Baseline:  Goal status: IN PROGRESS   3.  Pt to be I with pelvic wand/dilator use for pain management outside of PT.  Baseline: Pt working with dilators for pain reduction - believe she needs pelvic wand Goal status: MET 11/18/22   4.  Pt to be I with abdominal massage, voiding mechanics, breathing mechanics for improved pelvic relaxation and decreased pain.  Baseline:  Goal status: MET 12/16/22   5.  Pt to demonstrated improved coordination of pelvic floor and breathing mechanics with functional squat with no increase in pain. Baseline: increase in pain at Rt SIJ 12/16/22 Goal status: IN PROGRESS   6.  Pt to demonstrate at least 3/5 pelvic floor strength and ability to fully relax post contraction for improved pelvic stability and decreased strain at pelvic floor. Baseline: improved to 1-2/5 Goal status: IN PROGRESS   PLAN: PT FREQUENCY: 1-2x/week   PT DURATION:  6 months   PLANNED INTERVENTIONS: Therapeutic exercises, Therapeutic activity, Neuromuscular re-education, Patient/Family education, Self Care, Joint mobilization, Aquatic Therapy, Dry Needling, Spinal mobilization, Cryotherapy, Moist heat, scar mobilization, Taping, Vasopneumatic device, Biofeedback, and Manual therapy   PLAN FOR NEXT SESSION: Pelvic stability training; manual techniques for pain management.  Julio Alm, PT, DPT09/01/2410:37 AM

## 2022-12-25 ENCOUNTER — Ambulatory Visit: Payer: Medicaid Other | Admitting: Cardiology

## 2022-12-26 LAB — SPOTTED FEVER GROUP ANTIBODIES
Spotted Fever Group IgG: <1:64 {titer}
Spotted Fever Group IgM: <1:64 {titer}

## 2022-12-26 LAB — LYME DISEASE SEROLOGY W/REFLEX: Lyme Total Antibody EIA: NEGATIVE

## 2022-12-28 ENCOUNTER — Other Ambulatory Visit: Payer: Self-pay | Admitting: Dermatology

## 2022-12-28 DIAGNOSIS — W57XXXA Bitten or stung by nonvenomous insect and other nonvenomous arthropods, initial encounter: Secondary | ICD-10-CM

## 2022-12-29 ENCOUNTER — Other Ambulatory Visit: Payer: Medicaid Other

## 2022-12-29 ENCOUNTER — Other Ambulatory Visit: Payer: Self-pay | Admitting: Cardiology

## 2022-12-29 DIAGNOSIS — Z131 Encounter for screening for diabetes mellitus: Secondary | ICD-10-CM

## 2022-12-29 DIAGNOSIS — Z1329 Encounter for screening for other suspected endocrine disorder: Secondary | ICD-10-CM

## 2022-12-29 DIAGNOSIS — E785 Hyperlipidemia, unspecified: Secondary | ICD-10-CM

## 2022-12-29 DIAGNOSIS — I10 Essential (primary) hypertension: Secondary | ICD-10-CM

## 2022-12-30 ENCOUNTER — Ambulatory Visit: Payer: Medicaid Other | Admitting: Cardiology

## 2022-12-30 LAB — CMP14+EGFR
ALT: 8 IU/L (ref 0–32)
AST: 14 IU/L (ref 0–40)
Albumin: 4.4 g/dL (ref 3.9–4.9)
Alkaline Phosphatase: 28 IU/L — ABNORMAL LOW (ref 44–121)
BUN/Creatinine Ratio: 22 (ref 9–23)
BUN: 13 mg/dL (ref 6–24)
Bilirubin Total: 1.1 mg/dL (ref 0.0–1.2)
CO2: 25 mmol/L (ref 20–29)
Calcium: 9.3 mg/dL (ref 8.7–10.2)
Chloride: 101 mmol/L (ref 96–106)
Creatinine, Ser: 0.6 mg/dL (ref 0.57–1.00)
Globulin, Total: 1.8 g/dL (ref 1.5–4.5)
Glucose: 93 mg/dL (ref 70–99)
Potassium: 4.4 mmol/L (ref 3.5–5.2)
Sodium: 137 mmol/L (ref 134–144)
Total Protein: 6.2 g/dL (ref 6.0–8.5)
eGFR: 110 mL/min/{1.73_m2} (ref >59)

## 2022-12-30 LAB — LIPID PANEL
Chol/HDL Ratio: 2.8 ratio (ref 0.0–4.4)
Cholesterol, Total: 179 mg/dL (ref 100–199)
HDL: 65 mg/dL (ref >39)
LDL Chol Calc (NIH): 99 mg/dL (ref 0–99)
Triglycerides: 84 mg/dL (ref 0–149)
VLDL Cholesterol Cal: 15 mg/dL (ref 5–40)

## 2022-12-30 LAB — HEMOGLOBIN A1C
Est. average glucose Bld gHb Est-mCnc: 105 mg/dL
Hgb A1c MFr Bld: 5.3 % (ref 4.8–5.6)

## 2022-12-30 LAB — TSH: TSH: 2.44 u[IU]/mL (ref 0.450–4.500)

## 2023-01-01 ENCOUNTER — Ambulatory Visit: Payer: Medicaid Other

## 2023-01-01 DIAGNOSIS — M6281 Muscle weakness (generalized): Secondary | ICD-10-CM

## 2023-01-01 DIAGNOSIS — R102 Pelvic and perineal pain: Secondary | ICD-10-CM | POA: Diagnosis not present

## 2023-01-01 DIAGNOSIS — R293 Abnormal posture: Secondary | ICD-10-CM

## 2023-01-01 DIAGNOSIS — M5459 Other low back pain: Secondary | ICD-10-CM

## 2023-01-01 DIAGNOSIS — M62838 Other muscle spasm: Secondary | ICD-10-CM

## 2023-01-01 DIAGNOSIS — R279 Unspecified lack of coordination: Secondary | ICD-10-CM

## 2023-01-01 NOTE — Therapy (Signed)
OUTPATIENT PHYSICAL THERAPY FEMALE PELVIC TREATMENT   Patient Name: Melissa Pruitt MRN: [id number] DOB:1976-01-28, 47 y.o., female Today's Date: 01/01/2023  END OF SESSION:  PT End of Session - 01/01/23 0807     Visit Number 15    Date for PT Re-Evaluation 02/04/23    Authorization Type [employer]    Authorization Time Period 12/18/22-01/16/23    Authorization - Visit Number 2    Authorization - Number of Visits 4    PT Start Time 0805    PT Stop Time 0843    PT Time Calculation (min) 38 min    Activity Tolerance Patient tolerated treatment well    Behavior During Therapy [facility] for tasks assessed/performed                    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   R mid sole superior moderate atypia    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   R med sole inferior moderate atypia    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   R dorsum lat great toe   Dysplastic nevus 06/11/2020   left prox lat thigh sup, - severe   Dysplastic nevus 06/11/2020   left prox lat thigh inf, - moderate   Dysplastic nevus 06/11/2020   R mid to upper back 2.0cm lat to spine, Severe atypia   Dysplastic nevus 06/11/2020   R distal post thigh, moderate atypia   Dysplastic nevus 08/23/2020   Right labia - mild   Dysplastic nevus 08/12/2021   R lat neck post, moderate atypia   Dysplastic nevus 06/11/2012   L dorsal foot, mild atypia, bx from Dr. Larey Dresser   History of recurrent UTIs    HPV (human papilloma virus) infection    HSV (herpes simplex virus) infection    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[facility])    sacro/coccygeal mass excision   Melanoma (HCC) 02/16/2020   R mid plantar sole, BRESLOW'S DEPTH/MAXIMUM TUMOR THICKNESS: 0.5 MM, CLARK/ANATOMIC LEVEL: II Excised 03/20/2020   Normocytic anemia 08/21/2014   Tethered spinal cord (HCC)    Vitamin B 12 deficiency 08/21/2014   Past Surgical  History:  Procedure Laterality Date   BACK SURGERY     BLADDER SURGERY     COLOSTOMY     after flex sig as 47 year old.    colostomy reversal     EXPLORATORY LAPAROTOMY     with colostomy placement   LEEP     removal of cervical cancer     TUBAL LIGATION     Patient Active Problem List   Diagnosis Date Noted   Bacteriuria 12/10/2022   Allergic urticaria 07/02/2020   Food allergy 07/02/2020   Tendinitis of knee 07/02/2020   Vasomotor rhinitis 07/02/2020   Melanoma (HCC) 07/02/2020   Pain in joint of left shoulder 11/08/2019   Cervical radiculopathy 05/24/2019   Trigeminal neuralgia 11/05/2018   Symptomatic states associated with artificial menopause 11/05/2018   Arthralgia of hand, right 11/17/2017   Vitamin D deficiency 02/18/2017   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   Other specified disorders of uterus 10/12/2015   Herpes simplex 06/27/2015   Hyperlipidemia 05/30/2015   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   Normocytic anemia 08/21/2014   Vitamin B 12 deficiency 08/21/2014   Allergic rhinitis 01/19/2014   Condyloma acuminatum 01/09/2014   Chronic pain 08/09/2013   Cervical dysplasia 05/09/2013  Other headache syndrome 05/09/2013   Arthralgia of temporomandibular joint 05/09/2013   Cervical pain 08/25/2012   Pain in joint, pelvic region and thigh 07/16/2012   Tethered spinal cord (HCC) 07/16/2012   Balance disorder 07/16/2012   Personal history of fall 07/16/2012   Neurogenic bladder 06/25/2011   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   OTHER SPECIFIED CONGENITAL ANOMALY SPINAL CORD 05/08/2010   UNSPEC CONGN ANOMALY BRAIN SP CORD&NERV SYSTEM 05/08/2010   H N P-LUMBAR 04/23/2010   BACK PAIN 01/28/2010   Papanicolaou smear of cervix with atypical squamous cells cannot exclude high grade squamous intraepithelial lesion (ASC-H) 04/14/1898    PCP: Orson Eva, NP  REFERRING PROVIDER: Lonell Face, MD  REFERRING DIAG: R10.2 Pelvic pain  THERAPY DIAG:  Pelvic  pain  Abnormal posture  Muscle weakness (generalized)  Unspecified lack of coordination  Other muscle spasm  Other low back pain  Rationale for Evaluation and Treatment: Rehabilitation  ONSET DATE: chronic  SUBJECTIVE:                                                                                                                                                                                            SUBJECTIVE STATEMENT: Pt states that she is still having strange twitch sensation in Rt pelvic floor.   *Lives with tethered spinal cord; neurogenic bladder  PAIN:  Are you having pain? Yes NPRS scale: 8/10 Rt SIJ, 4/10 pelvic floor twitch (Lt pelvic spasm) Pain location: Rt ischial tuberosity with pain down lateral thigh to knee, Rt groin/anterior hip/abdomen, Rt low back, Rt side of tailbone; Rt SIJ   Pain type: aching, burning, dull, and stabbing  Pain description: constant (but level varies)   Aggravating factors: standing, sitting more than 30 mins Relieving factors: rest, muscle relaxer, taping, pelvic stability belt; popping, pillows for sleeping   PRECAUTIONS: Fall   WEIGHT BEARING RESTRICTIONS No   FALLS:  Has patient fallen in last 6 months? Yes. Number of falls 2   LIVING ENVIRONMENT: Lives with: lives with their family Lives in: House/apartment     OCCUPATION: disabled   PLOF: Independent   PATIENT GOALS to have less pain   PERTINENT HISTORY:  Myelomeningocele with surgical repair,  spina bifida, self-catheterizes for neurogenic bladder, Cervical cancer, HPV , recurrent UTIs, HSV , chronic rt hip pain, melanoma sle of Rt foot has been excised but has foot sensory changes, cervical radiculopathy, trigeminal neuralgia, grade 1 carpal tunnel, left mild chronic C7 radiculopathy   Sexual abuse: No   BOWEL MOVEMENT Pain with bowel movement: No Type of bowel movement:Type (Bristol Stool Scale) type 1-2 usually, will have  diarrhea intermittently but thinks  a lot of this is more about constipation and low motility, Frequency empty bowels every time caths, and usually this is enough unless has diarrhea, and Strain No Fully empty rectum: No does not have urge regularly, does have increased pressure in low back.   Leakage: Yes: with diarrhea wears AAT Pads: Yes: pull ups AAT Fiber supplement: No   URINATION Pain with urination: No Fully empty bladder: No does self cath to empty Stream: self-cath Urgency: No has no urge to have urine void Frequency: self caths 6x per day Leakage:  none Pads: No   INTERCOURSE Pain with intercourse:  not painful internally but will have pain at Rt pelvis or hip Ability to have vaginal penetration:  Yes:   Climax: yes not painful Marinoff Scale: 0/3   PREGNANCY Vaginal deliveries 3 Tearing Yes: tearing with first with stiches, tearing with second C-section deliveries 0 Currently pregnant No   PROLAPSE None       OBJECTIVE:   12/16/22: PFIQ-7: 29  Posture: Increased thoracic kyphosis, Rt lumbar rotation, elevated Rt iliac crest, Rt sacral rotation,   Stork test: On Rt, Lt pelvic drop, stable on Lt  Lumbar A/ROM: decreased lateral flexion to Lt by 50%, no movement in Rt SIJ with pain  Pelvic alignment: Rt posterior innominate rotation  Palpation: significant trigger points in Rt glutes  Functional test: -Squat: bil knee valgus, Rt SIJ pain, breath holding  11/18/22 Significant Lt anterior pelvic rotation/posterior Rt rotation still present  PFIQ-7: 77  MMT eval  Vaginal 1-2/5 with coordination still improved and less effort, equal bil  (Blank rows = not tested)  PALPATION: Presence of cyst in vaginal opening (pt reports 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); tenderness throughout Rt pelvic floor with pai nreproduction   10/07/22: LUMBAR AROM/PROM   A/PROM A/PROM  (% available)  Flexion WFL  Extension 75 with pain Rt  Right lateral flexion 75, pain with compression on Rt  Left lateral  flexion 50 limited by Rt side  Right rotation WNL  Left rotation 75 limtied by Rt side   LOWER EXTREMITY MMT:   MMT Right Eval All out of 5 Left Eval All out of 5  Hip flexion 2 4  Hip extension 3 4  Hip abduction 2+ 4  Hip adduction 3 4  Hip internal rotation  4 4  Hip external rotation  4 4  Knee flexion 3+ 4+  Knee extension 2+ 4+  Ankle dorsiflexion 0 4  Ankle plantarflexion 1+ 4  Ankle inversion 1+ 4  Ankle eversion 1+ 4    External Perineal Exam: no light touch, can feel deep pressure over Rt labia               Internal Pelvic Floor :  internal vaginal assessment only performed; decrease sensation throughout Rt superficial/deep pelvic floor; does have sensation over Rt obturator internus   Patient confirms identification and approves PT to assess internal pelvic floor and treatment Yes   PELVIC MMT:   MMT eval  Vaginal 1-2/5 with better coordination and less effort, equal bil  (Blank rows = not tested)  09/03/22 External Perineal Exam: no sensation Rt labia               Internal Pelvic Floor :  no sensation/decreased sensation superficial/deep pelvic floor - more sensation with palpation closer to coccyx vaginally, no sensation palpated on or lateral to coccyx rectally; significant hypermobility of coccyx; 100% pain reduction in low  back/coccyx with posterior pressure on Rt coccyx; increase in low back pain with posterior pressure on Lt coccyx; spasm in Rt OI   Patient confirms identification and approves PT to assess internal pelvic floor and treatment Yes-next session    PELVIC MMT:   MMT eval  Vaginal 1/5 with significant effort   Internal Anal Sphincter  0/5  External Anal Sphincter 0/5 Rt, 1/5 Lt   Puborectalis  1/5  Diastasis Recti    (Blank rows = not tested)         TONE: Low, decreased more on Rt than Lt   PROLAPSE: Grade 2 anterior vaginal wall laxity  08/20/22  PATIENT SURVEYS:  PFIQ-7 133     COGNITION:            Overall cognitive  status: Within functional limits for tasks assessed                          SENSATION:            Light touch: Deficits Rt flank down to toes has decreased sensation, decreased sensation at external genitalis             Proprioception: Deficits Rt side decreased    MUSCLE LENGTH: Rt hamstring and adductor limited by 50%, Lt 25%  GAIT: Comments: decreased mobility at Rt LE, foot drop Rt foot, decreased flexion of Rt knee, hip circumduction noted with gait at Rt, decreased cadence   POSTURE: rounded shoulders, forward head, and posterior pelvic tilt; Lt posterior/Rt anterior rotation; Slight Lt iliac crest elevation  FUNCTIONAL TASK: Lunge: bil instability present, pain with Rt   LUMBARAROM/PROM   A/PROM A/PROM  Eval (% available)  Flexion WFL  Extension 50 with pain Rt  Right lateral flexion WNL  Left lateral flexion 50 limited by Rt side  Right rotation WNL  Left rotation 50 limtied by Rt side   (Blank rows = not tested)   LOWER EXTREMITY ROM:   WFL but tension felt pt pain at Rt side   LOWER EXTREMITY MMT:   MMT Right Eval All out of 5 Left Eval All out of 5  Hip flexion 2 4  Hip extension 3 4  Hip abduction 2+ 4  Hip adduction 3 4  Hip internal rotation  4 4  Hip external rotation  4 4  Knee flexion 3+ 4+  Knee extension 2+ 4+  Ankle dorsiflexion 0 4  Ankle plantarflexion 1+ 4  Ankle inversion 1+ 4  Ankle eversion 1+ 4    PALPATION: General: Pain throughout Rt lower quadrant, flank, low back; significant scar tissue restriction across low back/sacrum       TODAY'S TREATMENT  01/01/23 Neuromuscular re-education: Side lying core activation with UE ball press and clam shell 2 x 10 Sidelying core activation with UE ball press and bil reverse clam shell 2 x 10 bil Bear crawl plank 2 x 10 Bird dog 2 x 10 Single leg bridge 10x bil Modified dead bug 10x bil   01-16-23 Manual: Trigger Point Dry-Needling  Treatment instructions: Expect mild to moderate  muscle soreness. S/S of pneumothorax if dry needled over a lung field, and to seek immediate medical attention should they occur. Patient verbalized understanding of these instructions and education.  Patient Consent Given: Yes Education handout provided: Previously provided Muscles treated: Rt glutes Electrical stimulation performed: No Parameters: N/A Treatment response/outcome: twitch response and release Soft tissue mobilization Rt glutes Side lying lumbopelvic mobilizations  External Lt OI release in Lt sidelying Instrument assisted soft tissue mobilization to Rt ilaic crest Exercises: Single leg bridge 5x bil Bent hip extensions in standing 10x bil Bent fire hydrant in standing 10x bil   12/16/22 Manual: Trigger Point Dry-Needling  Treatment instructions: Expect mild to moderate muscle soreness. S/S of pneumothorax if dry needled over a lung field, and to seek immediate medical attention should they occur. Patient verbalized understanding of these instructions and education.  Patient Consent Given: Yes Education handout provided: Previously provided Muscles treated: Rt glutes Electrical stimulation performed: No Parameters: N/A Treatment response/outcome: twitch response and release Soft tissue mobilization Rt glutes Side lying lumbopelvic mobilizations  Neuromuscular re-education: MET for Rt posterior/Lt anterior rotation Shotgun technique  PATIENT EDUCATION:  Education details: See above Person educated: Patient Education method: Explanation, Demonstration, Tactile cues, Verbal cues, and Handouts Education comprehension: verbalized understanding and returned demonstration     HOME EXERCISE PROGRAM: [id number]   ASSESSMENT:   CLINICAL IMPRESSION: Pt having more pain today. We discussed spending time today progressing strengthening activities in order to attempt longer term improvement in symptoms. She did very well with all pelvic stability and core activation  exercises. She did feel like she had limited motion in Rt hip/pelvis, but after self-manipulation with stretch she reported it felt better. She was assured that this manipulation is not harmful, but we would love to achieve improved pelvic stability that she would not have to perform this regularly. Good tolerance to all exercises and HEP updated. She will continue to benefit from skilled PT intervention in order to perform pain management in lifelong condition of spina bifida/tethered core and neurogenic bladder, progress functional strengthening program, and improve quality of life.      OBJECTIVE IMPAIRMENTS decreased activity tolerance, decreased coordination, decreased endurance, decreased mobility, difficulty walking, decreased strength, increased fascial restrictions, increased muscle spasms, impaired flexibility, improper body mechanics, postural dysfunction, and pain.    ACTIVITY LIMITATIONS carrying, lifting, sitting, standing, squatting, stairs, continence, toileting, and locomotion level   PARTICIPATION LIMITATIONS: meal prep, cleaning, interpersonal relationship, driving, shopping, community activity, and yard work   PERSONAL FACTORS Past/current experiences, Time since onset of injury/illness/exacerbation, and 3+ comorbidities: medical history  are also affecting patient's functional outcome.    REHAB POTENTIAL: Good   CLINICAL DECISION MAKING: Evolving/moderate complexity   EVALUATION COMPLEXITY: Moderate     GOALS: Goals reviewed with patient? Yes   SHORT TERM GOALS: Target date: 09/24/22 - updated 10/07/22 - 11/04/22 - updated 11/18/22 - updated 9/3/4   Pt to be I with HEP.  Baseline: Goal status: MET 10/07/22   2.  Pt will report 25% reduction of pain due to improvements in posture, strength, and muscle length  Baseline: She reports that on average she feels better 25% of the time, but there are still days in which pain is very high, like today Goal status: MET 11/18/22   3.   Pt will report her BMs and bladder voids are complete due to improved bowel habits and evacuation techniques.  Baseline: She states that she is now able to completely empty bladder, but bowel is not emptying completely even with splinting techniques Goal status: IN PROGRESS     LONG TERM GOALS: Target date: 02/04/23 - updated 10/07/22 - updated 11/04/22 - updated 11/18/22 - updated 12/16/22   Pt to be I with advanced HEP.  Baseline:  Goal status: IN PROGRESS   2.  Pt will report 50% reduction of pain due to improvements in posture, strength,  and muscle length  Baseline:  Goal status: IN PROGRESS   3.  Pt to be I with pelvic wand/dilator use for pain management outside of PT.  Baseline: Pt working with dilators for pain reduction - believe she needs pelvic wand Goal status: MET 11/18/22   4.  Pt to be I with abdominal massage, voiding mechanics, breathing mechanics for improved pelvic relaxation and decreased pain.  Baseline:  Goal status: MET 12/16/22   5.  Pt to demonstrated improved coordination of pelvic floor and breathing mechanics with functional squat with no increase in pain. Baseline: increase in pain at Rt SIJ 12/16/22 Goal status: IN PROGRESS   6.  Pt to demonstrate at least 3/5 pelvic floor strength and ability to fully relax post contraction for improved pelvic stability and decreased strain at pelvic floor. Baseline: improved to 1-2/5 Goal status: IN PROGRESS   PLAN: PT FREQUENCY: 1-2x/week   PT DURATION:  6 months   PLANNED INTERVENTIONS: Therapeutic exercises, Therapeutic activity, Neuromuscular re-education, Patient/Family education, Self Care, Joint mobilization, Aquatic Therapy, Dry Needling, Spinal mobilization, Cryotherapy, Moist heat, scar mobilization, Taping, Vasopneumatic device, Biofeedback, and Manual therapy   PLAN FOR NEXT SESSION: Pelvic stability training; manual techniques for pain management.  Julio Alm, PT, DPT09/19/248:31 AM

## 2023-01-05 ENCOUNTER — Ambulatory Visit: Payer: Medicaid Other | Admitting: Cardiology

## 2023-01-05 ENCOUNTER — Encounter: Payer: Self-pay | Admitting: Cardiology

## 2023-01-05 VITALS — BP 104/68 | HR 64 | Ht 64.0 in | Wt 120.2 lb

## 2023-01-05 DIAGNOSIS — Z1211 Encounter for screening for malignant neoplasm of colon: Secondary | ICD-10-CM | POA: Insufficient documentation

## 2023-01-05 DIAGNOSIS — N879 Dysplasia of cervix uteri, unspecified: Secondary | ICD-10-CM | POA: Diagnosis not present

## 2023-01-05 DIAGNOSIS — E782 Mixed hyperlipidemia: Secondary | ICD-10-CM

## 2023-01-05 DIAGNOSIS — Z202 Contact with and (suspected) exposure to infections with a predominantly sexual mode of transmission: Secondary | ICD-10-CM | POA: Insufficient documentation

## 2023-01-05 DIAGNOSIS — N390 Urinary tract infection, site not specified: Secondary | ICD-10-CM

## 2023-01-05 DIAGNOSIS — N319 Neuromuscular dysfunction of bladder, unspecified: Secondary | ICD-10-CM

## 2023-01-05 LAB — POCT URINALYSIS DIPSTICK
Bilirubin, UA: NEGATIVE
Blood, UA: NEGATIVE
Glucose, UA: NEGATIVE
Ketones, UA: NEGATIVE
Leukocytes, UA: NEGATIVE
Nitrite, UA: NEGATIVE
Protein, UA: NEGATIVE
Spec Grav, UA: 1.015 (ref 1.010–1.025)
Urobilinogen, UA: 0.2 E.U./dL
pH, UA: 7 (ref 5.0–8.0)

## 2023-01-05 NOTE — Progress Notes (Signed)
Established Patient Office Visit  Subjective:  Patient ID: Melissa Pruitt, female    DOB: 1975/11/27  Age: 47 y.o. MRN: [id number]  Chief Complaint  Patient presents with   Follow-up    4 month follow up    Patient in office for 4 month follow up, discuss recent lab results. No new complaints today. Discussed recent lab work. LDL at goal, patient reports she has cut out red meat and cheese from her diet.  Patient requesting STI blood work, recent exposure. Orders placed.  Patient overdue for colon cancer screening. Cologuard order placed. Patient states she has no family history of colon or rectal cancer.  Patient has frequent UTIs due to neurogenic bladder, will order a urine culture.    No other concerns at this time.   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 Larey Dresser   History of recurrent UTIs    HPV (human papilloma virus) infection    HSV (herpes simplex virus) infection    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[facility])    sacro/coccygeal mass excision   Melanoma (HCC) 02/16/2020   R mid plantar sole, BRESLOW'S DEPTH/MAXIMUM TUMOR THICKNESS: 0.5 MM, CLARK/ANATOMIC LEVEL: II Excised 03/20/2020   Normocytic anemia  08/21/2014   Tethered spinal cord (HCC)    Vitamin B 12 deficiency 08/21/2014    Past Surgical History:  Procedure Laterality Date   BACK SURGERY     BLADDER SURGERY     COLOSTOMY     after flex sig as 47 year old.    colostomy reversal     EXPLORATORY LAPAROTOMY     with colostomy placement   LEEP     removal of cervical cancer     TUBAL LIGATION      Social History   Socioeconomic History   Marital status: Divorced    Spouse name: Not on file   Number of children: 3   Years of education: Not on file   Highest education level: Not on file  Occupational History   Occupation: disability    Employer: OTHER  Tobacco Use   Smoking status: Former    Current packs/day: 0.00    Types: Cigarettes    Quit date: 07/14/2011    Years since quitting: 11.4   Smokeless tobacco: Never   Tobacco comments:    1-3 cigarettes  Vaping Use   Vaping status: Never Used  Substance and Sexual Activity   Alcohol use: Yes    Comment: twice a year   Drug use: No   Sexual activity: Yes    Birth control/protection: Pill, Condom  Other Topics Concern   Not on file  Social History Narrative   Not on file   Social Determinants of Health   Financial Resource Strain: Not on file  Food Insecurity: No Food Insecurity (06/12/2022)   Hunger Vital Sign    Worried About Running Out of Food in the Last Year: Never true    Ran Out of Food in the Last Year: Never true  Transportation Needs: No Transportation Needs (06/12/2022)   PRAPARE - Administrator, Civil Service (Medical): No    Lack of Transportation (Non-Medical): No  Physical Activity: Not on file  Stress: Not on file  Social Connections: Unknown (08/27/2021)   Received from [facility]   Social Network    Social Network: Not on file  Intimate Partner Violence: Unknown (07/19/2021)   Received from [facility]   HITS    Physically Hurt: Not on file    Insult or Talk Down To: Not on file     Threaten Physical Harm: Not on file    Scream or Curse: Not on file    Family History  Problem Relation Age of Onset   Hypertension Mother    Atrial fibrillation Mother    Cervical cancer Maternal Grandmother    Colon cancer Neg Hx    Breast cancer Neg Hx     Allergies  Allergen Reactions   Ciprofloxacin Anaphylaxis    Heart and nerve issues   Sulfa Antibiotics Anaphylaxis    Patient reporting sulfa drugs cause anaphylaxis, difficulty breathing and hives, as well as nausea vomiting   Azithromycin Nausea And Vomiting   Lactose     Other reaction(s): Other (See Comments) Digestive issues/pain   Prednisone Other (See Comments)   Rosuvastatin Other (See Comments)   Tramadol Hcl     REACTION: HEART PALPITATIONS /SWELLING /SOB   Vitamin D Analogs     Severe Diarhhea   Lidocaine Palpitations   Pork-Derived Products Nausea And Vomiting    Review of Systems  Constitutional: Negative.   HENT: Negative.    Eyes: Negative.   Respiratory: Negative.  Negative for shortness of breath.   Cardiovascular: Negative.  Negative for chest pain.  Gastrointestinal: Negative.  Negative for abdominal pain, constipation and diarrhea.  Genitourinary: Negative.   Musculoskeletal:  Negative for joint pain and myalgias.  Skin: Negative.   Neurological: Negative.  Negative for dizziness and headaches.  Endo/Heme/Allergies: Negative.   All other systems reviewed and are negative.      Objective:   BP 104/68   Pulse 64   Ht 5\' 4"  (1.626 m)   Wt 120 lb 3.2 oz (54.5 kg)   SpO2 100%   BMI 20.63 kg/m   Vitals:   01/05/23 0957  BP: 104/68  Pulse: 64  Height: 5\' 4"  (1.626 m)  Weight: 120 lb 3.2 oz (54.5 kg)  SpO2: 100%  BMI (Calculated): 20.62    Physical Exam Vitals and nursing note reviewed.  Constitutional:      Appearance: Normal appearance. She is normal weight.  HENT:     Head: Normocephalic and atraumatic.     Nose: Nose normal.     Mouth/Throat:     Mouth: Mucous  membranes are moist.  Eyes:     Extraocular Movements: Extraocular movements intact.     Conjunctiva/sclera: Conjunctivae normal.     Pupils: Pupils are equal, round, and reactive to light.  Cardiovascular:     Rate and Rhythm: Normal rate and regular  rhythm.     Pulses: Normal pulses.     Heart sounds: Normal heart sounds.  Pulmonary:     Effort: Pulmonary effort is normal.     Breath sounds: Normal breath sounds.  Abdominal:     General: Abdomen is flat. Bowel sounds are normal.     Palpations: Abdomen is soft.  Musculoskeletal:        General: Normal range of motion.     Cervical back: Normal range of motion.  Skin:    General: Skin is warm and dry.  Neurological:     General: No focal deficit present.     Mental Status: She is alert and oriented to person, place, and time.  Psychiatric:        Mood and Affect: Mood normal.        Behavior: Behavior normal.        Thought Content: Thought content normal.        Judgment: Judgment normal.      No results found for any visits on 01/05/23.  Recent Results (from the past 2160 hour(s))  POCT Urinalysis Dipstick     Status: Abnormal   Collection Time: 10/21/22  8:55 AM  Result Value Ref Range   Color, UA yellow    Clarity, UA clear    Glucose, UA Negative Negative   Bilirubin, UA negative    Ketones, UA negative    Spec Grav, UA 1.010 1.010 - 1.025   Blood, UA small    pH, UA 6.0 5.0 - 8.0   Protein, UA Negative Negative   Urobilinogen, UA 0.2 0.2 or 1.0 E.U./dL   Nitrite, UA negative    Leukocytes, UA Moderate (2+) (A) Negative   Appearance     Odor    Urine Culture     Status: Abnormal   Collection Time: 10/21/22  2:07 PM   Specimen: Urine, Random  Result Value Ref Range   Specimen Description URINE, RANDOM    Special Requests      NONE Performed at [facility], [address] [place], [place] [address]    Culture >=100,000 COLONIES/mL KLEBSIELLA PNEUMONIAE (A)    Report Status 10/23/2022 FINAL     Organism ID, Bacteria KLEBSIELLA PNEUMONIAE (A)       Susceptibility   Klebsiella pneumoniae - MIC*    AMPICILLIN >=32 RESISTANT Resistant     CEFAZOLIN <=4 SENSITIVE Sensitive     CEFEPIME <=0.12 SENSITIVE Sensitive     CEFTRIAXONE <=0.25 SENSITIVE Sensitive     CIPROFLOXACIN <=0.25 SENSITIVE Sensitive     GENTAMICIN <=1 SENSITIVE Sensitive     IMIPENEM <=0.25 SENSITIVE Sensitive     NITROFURANTOIN 128 RESISTANT Resistant     TRIMETH/SULFA <=20 SENSITIVE Sensitive     AMPICILLIN/SULBACTAM 4 SENSITIVE Sensitive     PIP/TAZO <=4 SENSITIVE Sensitive     * >=100,000 COLONIES/mL KLEBSIELLA PNEUMONIAE  CBC     Status: None   Collection Time: 10/28/22 12:26 PM  Result Value Ref Range   WBC 4.9 3.4 - 10.8 x10E3/uL   RBC 4.13 3.77 - 5.28 x10E6/uL   Hemoglobin 13.1 11.1 - 15.9 g/dL   Hematocrit 25.9 56.3 - 46.6 %   MCV 91 79 - 97 fL   MCH 31.7 26.6 - 33.0 pg   MCHC 34.8 31.5 - 35.7 g/dL   RDW 87.5 64.3 - 32.9 %   Platelets 192 150 - 450 x10E3/uL  Basic metabolic panel     Status: None   Collection  Time: 10/28/22 12:26 PM  Result Value Ref Range   Glucose 89 70 - 99 mg/dL   BUN 7 6 - 24 mg/dL   Creatinine, Ser 1.61 0.57 - 1.00 mg/dL   eGFR 096 >04 VW/UJW/1.19   BUN/Creatinine Ratio 10 9 - 23   Sodium 140 134 - 144 mmol/L   Potassium 4.0 3.5 - 5.2 mmol/L   Chloride 101 96 - 106 mmol/L   CO2 23 20 - 29 mmol/L   Calcium 9.0 8.7 - 10.2 mg/dL  POCT Urinalysis Dipstick     Status: None   Collection Time: 10/28/22 12:42 PM  Result Value Ref Range   Color, UA Yellow    Clarity, UA Clear    Glucose, UA Negative Negative   Bilirubin, UA Negative    Ketones, UA Negative    Spec Grav, UA 1.020 1.010 - 1.025   Blood, UA Negative    pH, UA 6.0 5.0 - 8.0   Protein, UA Negative Negative   Urobilinogen, UA 0.2 0.2 or 1.0 E.U./dL   Nitrite, UA Negative    Leukocytes, UA Negative Negative   Appearance     Odor    Urine Culture     Status: None   Collection Time: 10/28/22  3:21 PM    Specimen: Urine, Random  Result Value Ref Range   Specimen Description URINE, RANDOM    Special Requests NONE    Culture      NO GROWTH Performed at [facility], [address], [place], [place] [address]    Report Status 10/29/2022 FINAL   Urine Culture     Status: Abnormal   Collection Time: 11/06/22  1:59 PM   Specimen: Urine, Random  Result Value Ref Range   Specimen Description URINE, RANDOM    Special Requests      NONE Performed at [facility], [address] [place], [place] [address]    Culture >=100,000 COLONIES/mL KLEBSIELLA PNEUMONIAE (A)    Report Status 11/08/2022 FINAL    Organism ID, Bacteria KLEBSIELLA PNEUMONIAE (A)       Susceptibility   Klebsiella pneumoniae - MIC*    AMPICILLIN >=32 RESISTANT Resistant     CEFAZOLIN <=4 SENSITIVE Sensitive     CEFEPIME <=0.12 SENSITIVE Sensitive     CEFTRIAXONE <=0.25 SENSITIVE Sensitive     CIPROFLOXACIN <=0.25 SENSITIVE Sensitive     GENTAMICIN <=1 SENSITIVE Sensitive     IMIPENEM <=0.25 SENSITIVE Sensitive     NITROFURANTOIN 128 RESISTANT Resistant     TRIMETH/SULFA <=20 SENSITIVE Sensitive     AMPICILLIN/SULBACTAM 4 SENSITIVE Sensitive     PIP/TAZO <=4 SENSITIVE Sensitive     * >=100,000 COLONIES/mL KLEBSIELLA PNEUMONIAE  POCT Urinalysis Dipstick     Status: Abnormal   Collection Time: 11/06/22  2:15 PM  Result Value Ref Range   Color, UA Dark Yellow    Clarity, UA Cloudy    Glucose, UA Negative Negative   Bilirubin, UA Negative    Ketones, UA Negative    Spec Grav, UA 1.010 1.010 - 1.025   Blood, UA Negative    pH, UA 6.0 5.0 - 8.0   Protein, UA Negative Negative   Urobilinogen, UA 0.2 0.2 or 1.0 E.U./dL   Nitrite, UA Positive    Leukocytes, UA Moderate (2+) (A) Negative   Appearance     Odor    Spotted Fever Group Antibodies     Status: None   Collection Time: 12/19/22 10:59 AM  Result Value  Ref Range   Spotted Fever Group IgG <1:64 Neg:<1:64   Spotted Fever Group IgM <1:64 Neg:<1:64    Result Comment Comment     Comment: Spotted Fever Group IgG serum endpoint titer of >=1:64 is suggestive of infection at an unknown time and may be a sign of either past infection or early response to a recent infection. Spotted Fever Group IgM titer of >=1:64 is regarded as probable evidence of recent or ongoing infection. A four-fold or greater increase in titer between two serum samples drawn 1-2 weeks apart and tested in parallel is the best serologic indicator of a recent rickettsial infection.   Lyme Disease Serology w/Reflex     Status: None   Collection Time: 12/19/22 10:59 AM  Result Value Ref Range   Lyme Total Antibody EIA Negative Negative    Comment: Lyme antibodies not detected. Reflex testing is not indicated. No laboratory evidence of infection with B. burgdorferi (Lyme disease). Negative results may occur in patients recently infected (less than or equal to 14 days) with B. burgdorferi.  If recent infection is suspected, repeat testing on a new sample collected in 7 to 14 days is recommended.   CMP14+EGFR     Status: Abnormal   Collection Time: 12/29/22 10:39 AM  Result Value Ref Range   Glucose 93 70 - 99 mg/dL   BUN 13 6 - 24 mg/dL   Creatinine, Ser 8.11 0.57 - 1.00 mg/dL   eGFR 914 >78 GN/FAO/1.30   BUN/Creatinine Ratio 22 9 - 23   Sodium 137 134 - 144 mmol/L   Potassium 4.4 3.5 - 5.2 mmol/L   Chloride 101 96 - 106 mmol/L   CO2 25 20 - 29 mmol/L   Calcium 9.3 8.7 - 10.2 mg/dL   Total Protein 6.2 6.0 - 8.5 g/dL   Albumin 4.4 3.9 - 4.9 g/dL   Globulin, Total 1.8 1.5 - 4.5 g/dL   Bilirubin Total 1.1 0.0 - 1.2 mg/dL   Alkaline Phosphatase 28 (L) 44 - 121 IU/L   AST 14 0 - 40 IU/L   ALT 8 0 - 32 IU/L  Lipid panel     Status: None   Collection Time: 12/29/22 10:39 AM  Result Value Ref Range   Cholesterol, Total 179 100 - 199 mg/dL   Triglycerides 84 0 - 149 mg/dL   HDL 65 >86 mg/dL   VLDL Cholesterol Cal 15 5 - 40 mg/dL   LDL Chol Calc (NIH) 99 0 - 99  mg/dL   Chol/HDL Ratio 2.8 0.0 - 4.4 ratio    Comment:                                   T. Chol/HDL Ratio                                             Men  Women                               1/2 Avg.Risk  3.4    3.3                                   Avg.Risk  5.0  4.4                                2X Avg.Risk  9.6    7.1                                3X Avg.Risk 23.4   11.0   Hemoglobin A1c     Status: None   Collection Time: 12/29/22 10:39 AM  Result Value Ref Range   Hgb A1c MFr Bld 5.3 4.8 - 5.6 %    Comment:          Prediabetes: 5.7 - 6.4          Diabetes: >6.4          Glycemic control for adults with diabetes: <7.0    Est. average glucose Bld gHb Est-mCnc 105 mg/dL  TSH     Status: None   Collection Time: 12/29/22 10:39 AM  Result Value Ref Range   TSH 2.440 0.450 - 4.500 uIU/mL      Assessment & Plan:  Cologaurd ordered  STI blood work today.  Urine culture for frequent UTIs.  Problem List Items Addressed This Visit       Genitourinary   Cervical dysplasia     Other   Hyperlipidemia   Neurogenic bladder   Colon cancer screening - Primary   Relevant Orders   Cologuard   Possible exposure to STI   Relevant Orders   Chlamydia Panel Serum   HIV antibody (with reflex)   RPR    Return in about 4 months (around 05/07/2023) for with fasting labs prior.   Total time spent: 25 minutes  [employer], NP  01/05/2023   This document may have been prepared by Dragon Voice Recognition software and as such may include unintentional dictation errors.

## 2023-01-07 ENCOUNTER — Ambulatory Visit: Payer: Medicaid Other

## 2023-01-07 DIAGNOSIS — M5459 Other low back pain: Secondary | ICD-10-CM

## 2023-01-07 DIAGNOSIS — M6281 Muscle weakness (generalized): Secondary | ICD-10-CM

## 2023-01-07 DIAGNOSIS — R279 Unspecified lack of coordination: Secondary | ICD-10-CM

## 2023-01-07 DIAGNOSIS — R293 Abnormal posture: Secondary | ICD-10-CM

## 2023-01-07 DIAGNOSIS — R102 Pelvic and perineal pain: Secondary | ICD-10-CM

## 2023-01-07 DIAGNOSIS — M62838 Other muscle spasm: Secondary | ICD-10-CM

## 2023-01-07 LAB — URINE CULTURE: Organism ID, Bacteria: NO GROWTH

## 2023-01-07 NOTE — Therapy (Signed)
OUTPATIENT PHYSICAL THERAPY FEMALE PELVIC TREATMENT   Patient Name: Melissa Pruitt MRN: [id number] DOB:06/26/1975, 47 y.o., female Today's Date: 01/07/2023  END OF SESSION:  PT End of Session - 01/07/23 1402     Visit Number 16    Date for PT Re-Evaluation --    Authorization Type [employer]    Authorization Time Period 12/18/22-01/16/23    Authorization - Visit Number 3    Authorization - Number of Visits 4    PT Start Time 1400    PT Stop Time 1440    PT Time Calculation (min) 40 min    Activity Tolerance Patient tolerated treatment well    Behavior During Therapy [facility] for tasks assessed/performed                    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   R mid sole superior moderate atypia    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   R med sole inferior moderate atypia    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   R dorsum lat great toe   Dysplastic nevus 06/11/2020   left prox lat thigh sup, - severe   Dysplastic nevus 06/11/2020   left prox lat thigh inf, - moderate   Dysplastic nevus 06/11/2020   R mid to upper back 2.0cm lat to spine, Severe atypia   Dysplastic nevus 06/11/2020   R distal post thigh, moderate atypia   Dysplastic nevus 08/23/2020   Right labia - mild   Dysplastic nevus 08/12/2021   R lat neck post, moderate atypia   Dysplastic nevus 06/11/2012   L dorsal foot, mild atypia, bx from Dr. Larey Dresser   History of recurrent UTIs    HPV (human papilloma virus) infection    HSV (herpes simplex virus) infection    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[facility])    sacro/coccygeal mass excision   Melanoma (HCC) 02/16/2020   R mid plantar sole, BRESLOW'S DEPTH/MAXIMUM TUMOR THICKNESS: 0.5 MM, CLARK/ANATOMIC LEVEL: II Excised 03/20/2020   Normocytic anemia 08/21/2014   Tethered spinal cord (HCC)    Vitamin B 12 deficiency 08/21/2014   Past Surgical  History:  Procedure Laterality Date   BACK SURGERY     BLADDER SURGERY     COLOSTOMY     after flex sig as 47 year old.    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   Melanoma (HCC) 07/02/2020   Pain in joint of left shoulder 11/08/2019   Cervical radiculopathy 05/24/2019   Trigeminal neuralgia 11/05/2018   Symptomatic states associated with artificial menopause 11/05/2018   Arthralgia of hand, right 11/17/2017   Vitamin D deficiency 02/18/2017   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   Other specified disorders of uterus 10/12/2015   Herpes simplex 06/27/2015   Hyperlipidemia 05/30/2015   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   Normocytic anemia 08/21/2014   Vitamin B 12 deficiency 08/21/2014   Allergic rhinitis 01/19/2014   Condyloma acuminatum  01/09/2014   Chronic pain 08/09/2013   Cervical dysplasia 05/09/2013   Other headache syndrome 05/09/2013   Arthralgia of temporomandibular joint 05/09/2013   Cervical pain 08/25/2012   Pain in joint, pelvic region and thigh 07/16/2012   Tethered spinal cord (HCC) 07/16/2012   Balance disorder 07/16/2012   Personal history of fall 07/16/2012   Neurogenic bladder 06/25/2011   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   OTHER SPECIFIED CONGENITAL ANOMALY SPINAL CORD 05/08/2010   UNSPEC CONGN ANOMALY BRAIN SP CORD&NERV SYSTEM 05/08/2010   H N P-LUMBAR 04/23/2010   BACK PAIN 01/28/2010   Papanicolaou smear of cervix with atypical squamous cells cannot exclude high grade squamous intraepithelial lesion (ASC-H) 04/14/1898    PCP: Orson Eva, NP  REFERRING PROVIDER: Lonell Face, MD  REFERRING DIAG: R10.2 Pelvic pain  THERAPY DIAG:  Pelvic pain  Abnormal posture  Muscle weakness (generalized)  Unspecified lack of coordination  Other muscle spasm  Other low back pain  Rationale for Evaluation and Treatment: Rehabilitation  ONSET DATE: chronic  SUBJECTIVE:                                                                                                                                                                                            SUBJECTIVE STATEMENT: Pt states that she is having low levels of pain. She starts physical therapy for foot this week. Due to progress and feeling independent with pain management techniques, she feels like she can discharge pelvic floor physical therapy at this time. She notes that bowel massage is very helpful in reducing bowel dysfunction. She has pelvic floor muscle wand to decrease pelvic pain. Her pelvic stability belt is helpful, especially during specific exercises, to reduce pelvic/low back pain.   PAIN:  Are you having pain? Yes NPRS scale: 4-5/10 Pain location: Rt ischial tuberosity with pain down lateral thigh to knee, Rt groin/anterior hip/abdomen, Rt low back, Rt side of tailbone; Rt SIJ   Pain type: aching, burning, dull, and stabbing  Pain description: constant (but level varies)   Aggravating factors: standing, sitting more than 30 mins Relieving factors: rest, muscle relaxer, taping, pelvic stability belt; popping, pillows for sleeping   PRECAUTIONS: Fall   WEIGHT BEARING RESTRICTIONS No   FALLS:  Has patient fallen in last 6 months? Yes. Number of falls 2   LIVING ENVIRONMENT: Lives with: lives with their family Lives in: House/apartment     OCCUPATION: disabled   PLOF: Independent   PATIENT GOALS to have less pain   PERTINENT HISTORY:  Myelomeningocele with surgical repair,  spina bifida, self-catheterizes for neurogenic bladder, Cervical cancer, HPV ,  recurrent UTIs, HSV ,  chronic rt hip pain, melanoma sle of Rt foot has been excised but has foot sensory changes, cervical radiculopathy, trigeminal neuralgia, grade 1 carpal tunnel, left mild chronic C7 radiculopathy   Sexual abuse: No   BOWEL MOVEMENT Pain with bowel movement: No Type of bowel movement:Type (Bristol Stool Scale) type 1-2 usually, will have diarrhea intermittently but thinks a lot of this is more about constipation and low motility, Frequency empty bowels every time caths, and usually this is enough unless has diarrhea, and Strain No Fully empty rectum: No does not have urge regularly, does have increased pressure in low back.   Leakage: Yes: with diarrhea wears AAT Pads: Yes: pull ups AAT Fiber supplement: No   URINATION Pain with urination: No Fully empty bladder: No does self cath to empty Stream: self-cath Urgency: No has no urge to have urine void Frequency: self caths 6x per day Leakage:  none Pads: No   INTERCOURSE Pain with intercourse:  not painful internally but will have pain at Rt pelvis or hip Ability to have vaginal penetration:  Yes:   Climax: yes not painful Marinoff Scale: 0/3   PREGNANCY Vaginal deliveries 3 Tearing Yes: tearing with first with stiches, tearing with second C-section deliveries 0 Currently pregnant No   PROLAPSE None       OBJECTIVE:   01/07/23: PFIQ-7: 19  12/16/22: PFIQ-7: 29  Posture: Increased thoracic kyphosis, Rt lumbar rotation, elevated Rt iliac crest, Rt sacral rotation,   Stork test: On Rt, Lt pelvic drop, stable on Lt  Lumbar A/ROM: decreased lateral flexion to Lt by 50%, no movement in Rt SIJ with pain  Pelvic alignment: Rt posterior innominate rotation  Palpation: significant trigger points in Rt glutes  Functional test: -Squat: bil knee valgus, Rt SIJ pain, breath holding  11/18/22 Significant Lt anterior pelvic rotation/posterior Rt rotation still present  PFIQ-7: 77  MMT eval  Vaginal 1-2/5 with coordination  still improved and less effort, equal bil  (Blank rows = not tested)  PALPATION: Presence of cyst in vaginal opening (pt reports 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); tenderness throughout Rt pelvic floor with pai nreproduction   10/07/22: LUMBAR AROM/PROM   A/PROM A/PROM  (% available)  Flexion WFL  Extension 75 with pain Rt  Right lateral flexion 75, pain with compression on Rt  Left lateral flexion 50 limited by Rt side  Right rotation WNL  Left rotation 75 limtied by Rt side   LOWER EXTREMITY MMT:   MMT Right Eval All out of 5 Left Eval All out of 5  Hip flexion 2 4  Hip extension 3 4  Hip abduction 2+ 4  Hip adduction 3 4  Hip internal rotation  4 4  Hip external rotation  4 4  Knee flexion 3+ 4+  Knee extension 2+ 4+  Ankle dorsiflexion 0 4  Ankle plantarflexion 1+ 4  Ankle inversion 1+ 4  Ankle eversion 1+ 4    External Perineal Exam: no light touch, can feel deep pressure over Rt labia               Internal Pelvic Floor :  internal vaginal assessment only performed; decrease sensation throughout Rt superficial/deep pelvic floor; does have sensation over Rt obturator internus   Patient confirms identification and approves PT to assess internal pelvic floor and treatment Yes   PELVIC MMT:   MMT eval  Vaginal 1-2/5 with better coordination and less effort, equal bil  (Blank rows =  not tested)  09/03/22 External Perineal Exam: no sensation Rt labia               Internal Pelvic Floor :  no sensation/decreased sensation superficial/deep pelvic floor - more sensation with palpation closer to coccyx vaginally, no sensation palpated on or lateral to coccyx rectally; significant hypermobility of coccyx; 100% pain reduction in low back/coccyx with posterior pressure on Rt coccyx; increase in low back pain with posterior pressure on Lt coccyx; spasm in Rt OI   Patient confirms identification and approves PT to assess internal pelvic floor and treatment Yes-next session     PELVIC MMT:   MMT eval  Vaginal 1/5 with significant effort   Internal Anal Sphincter  0/5  External Anal Sphincter 0/5 Rt, 1/5 Lt   Puborectalis  1/5  Diastasis Recti    (Blank rows = not tested)         TONE: Low, decreased more on Rt than Lt   PROLAPSE: Grade 2 anterior vaginal wall laxity  08/20/22  PATIENT SURVEYS:  PFIQ-7 133     COGNITION:            Overall cognitive status: Within functional limits for tasks assessed                          SENSATION:            Light touch: Deficits Rt flank down to toes has decreased sensation, decreased sensation at external genitalis             Proprioception: Deficits Rt side decreased    MUSCLE LENGTH: Rt hamstring and adductor limited by 50%, Lt 25%  GAIT: Comments: decreased mobility at Rt LE, foot drop Rt foot, decreased flexion of Rt knee, hip circumduction noted with gait at Rt, decreased cadence   POSTURE: rounded shoulders, forward head, and posterior pelvic tilt; Lt posterior/Rt anterior rotation; Slight Lt iliac crest elevation  FUNCTIONAL TASK: Lunge: bil instability present, pain with Rt   LUMBARAROM/PROM   A/PROM A/PROM  Eval (% available)  Flexion WFL  Extension 50 with pain Rt  Right lateral flexion WNL  Left lateral flexion 50 limited by Rt side  Right rotation WNL  Left rotation 50 limtied by Rt side   (Blank rows = not tested)   LOWER EXTREMITY ROM:   WFL but tension felt pt pain at Rt side   LOWER EXTREMITY MMT:   MMT Right Eval All out of 5 Left Eval All out of 5  Hip flexion 2 4  Hip extension 3 4  Hip abduction 2+ 4  Hip adduction 3 4  Hip internal rotation  4 4  Hip external rotation  4 4  Knee flexion 3+ 4+  Knee extension 2+ 4+  Ankle dorsiflexion 0 4  Ankle plantarflexion 1+ 4  Ankle inversion 1+ 4  Ankle eversion 1+ 4    PALPATION: General: Pain throughout Rt lower quadrant, flank, low back; significant scar tissue restriction across low back/sacrum        TODAY'S TREATMENT  01/07/23 Neuromuscular re-education: Standing chop 2 x 10 bil Standing march 2 x 10 5lb anterior weight hold  Standing extension green band 2 x 10 Pallof press green band 10x bil Therapeutic activities: 3-way kick 10x each, bil  Bent row 2 x 10 bil 5lbs    01/01/23 Neuromuscular re-education: Side lying core activation with UE ball press and clam shell 2 x 10 Sidelying core activation  with UE ball press and bil reverse clam shell 2 x 10 bil Bear crawl plank 2 x 10 Bird dog 2 x 10 Single leg bridge 10x bil Modified dead bug 10x bil   12/24/2022 Manual: Trigger Point Dry-Needling  Treatment instructions: Expect mild to moderate muscle soreness. S/S of pneumothorax if dry needled over a lung field, and to seek immediate medical attention should they occur. Patient verbalized understanding of these instructions and education.  Patient Consent Given: Yes Education handout provided: Previously provided Muscles treated: Rt glutes Electrical stimulation performed: No Parameters: N/A Treatment response/outcome: twitch response and release Soft tissue mobilization Rt glutes Side lying lumbopelvic mobilizations  External Lt OI release in Lt sidelying Instrument assisted soft tissue mobilization to Rt ilaic crest Exercises: Single leg bridge 5x bil Bent hip extensions in standing 10x bil Bent fire hydrant in standing 10x bil   PATIENT EDUCATION:  Education details: See above Person educated: Patient Education method: Programmer, multimedia, Demonstration, Tactile cues, Verbal cues, and Handouts Education comprehension: verbalized understanding and returned demonstration     HOME EXERCISE PROGRAM: [id number]   ASSESSMENT:   CLINICAL IMPRESSION: Pt has made excellent progress towards goals and pain reduction since starting pelvic floor physical therapy. Her PFIQ-7 continues to show functional improvement having lowered from 29 to 19 in the last 3 weeks and is reporting  more than 50% reduction in pain overall since starting PT. She was able to progress core strengthening activities to include standing exercises with no pain and appropriate challenge. Pt does live with chronic pain, but her symptoms are currently managements with HEP and home pain relief techniques. Due to progress, she is prepared to D/C skilled pelvic floor physical therapy at this time.      OBJECTIVE IMPAIRMENTS decreased activity tolerance, decreased coordination, decreased endurance, decreased mobility, difficulty walking, decreased strength, increased fascial restrictions, increased muscle spasms, impaired flexibility, improper body mechanics, postural dysfunction, and pain.    ACTIVITY LIMITATIONS carrying, lifting, sitting, standing, squatting, stairs, continence, toileting, and locomotion level   PARTICIPATION LIMITATIONS: meal prep, cleaning, interpersonal relationship, driving, shopping, community activity, and yard work   PERSONAL FACTORS Past/current experiences, Time since onset of injury/illness/exacerbation, and 3+ comorbidities: medical history  are also affecting patient's functional outcome.    REHAB POTENTIAL: Good   CLINICAL DECISION MAKING: Evolving/moderate complexity   EVALUATION COMPLEXITY: Moderate     GOALS: Goals reviewed with patient? Yes   SHORT TERM GOALS: Target date: 09/24/22 - updated 10/07/22 - 11/04/22 - updated 11/18/22 - updated 9/3/4 - updated 01/07/23   Pt to be I with HEP.  Baseline: Goal status: MET 10/07/22   2.  Pt will report 25% reduction of pain due to improvements in posture, strength, and muscle length  Baseline: She reports that on average she feels better 25% of the time, but there are still days in which pain is very high, like today Goal status: MET 11/18/22   3.  Pt will report her BMs and bladder voids are complete due to improved bowel habits and evacuation techniques.  Baseline: She states that she is now able to completely empty  bladder, but bowel is not emptying completely even with splinting techniques Goal status: MET 01/07/23     LONG TERM GOALS: Target date: 02/04/23 - updated 10/07/22 - updated 11/04/22 - updated 11/18/22 - updated 12/16/22 - updated 01/07/23   Pt to be I with advanced HEP.  Baseline:  Goal status: MET 01/07/23   2.  Pt will report 50% reduction of  pain due to improvements in posture, strength, and muscle length  Baseline: She reports feeling between 50-75% relief in the pain that brought her to physical therapy and has pain relief techniques that work Goal status: MET 01/07/23   3.  Pt to be I with pelvic wand/dilator use for pain management outside of PT.  Baseline: Pt working with dilators for pain reduction - believe she needs pelvic wand Goal status: MET 11/18/22   4.  Pt to be I with abdominal massage, voiding mechanics, breathing mechanics for improved pelvic relaxation and decreased pain.  Baseline:  Goal status: MET 12/16/22   5.  Pt to demonstrated improved coordination of pelvic floor and breathing mechanics with functional squat with no increase in pain. Baseline:  Goal status: MET 01/07/23   6.  Pt to demonstrate at least 3/5 pelvic floor strength and ability to fully relax post contraction for improved pelvic stability and decreased strain at pelvic floor. Baseline: improved to 1-2/5 Goal status: DISCHARGED 01/07/23   PLAN: PT FREQUENCY: D/C   PT DURATION:  D/C   PLANNED INTERVENTIONS: D/C   PLAN FOR NEXT SESSION: D/C  PHYSICAL THERAPY DISCHARGE SUMMARY  Visits from Start of Care: 16  Current functional level related to goals / functional outcomes: Independent   Remaining deficits: See above   Education / Equipment: HEP   Patient agrees to discharge. Patient goals were partially met. Patient is being discharged due to being pleased with the current functional level.  Julio Alm, PT, DPT09/25/242:21 PM

## 2023-01-08 LAB — CHLAMYDIA PANEL SERUM
C. Pneumoniae IgM Serum: <1:10 {titer}
C. Psittaci IgM Serum: <1:10 {titer}
C. Trachomatis IgM Serum: <1:10 {titer}

## 2023-01-08 LAB — HIV ANTIBODY (ROUTINE TESTING W REFLEX): HIV Screen 4th Generation wRfx: NONREACTIVE

## 2023-01-08 LAB — RPR: RPR Ser Ql: NONREACTIVE

## 2023-01-09 ENCOUNTER — Ambulatory Visit (INDEPENDENT_AMBULATORY_CARE_PROVIDER_SITE_OTHER): Payer: Medicaid Other

## 2023-01-09 ENCOUNTER — Other Ambulatory Visit (HOSPITAL_COMMUNITY)
Admission: RE | Admit: 2023-01-09 | Discharge: 2023-01-09 | Disposition: A | Discharge status: Home / Self Care | Payer: Medicaid Other | Source: Other Acute Inpatient Hospital | Attending: Obstetrics and Gynecology | Admitting: Obstetrics and Gynecology

## 2023-01-09 ENCOUNTER — Encounter: Payer: Self-pay | Admitting: Obstetrics and Gynecology

## 2023-01-09 ENCOUNTER — Ambulatory Visit: Payer: Medicaid Other

## 2023-01-09 DIAGNOSIS — R82998 Other abnormal findings in urine: Secondary | ICD-10-CM | POA: Insufficient documentation

## 2023-01-09 DIAGNOSIS — R350 Frequency of micturition: Secondary | ICD-10-CM

## 2023-01-09 DIAGNOSIS — N3000 Acute cystitis without hematuria: Secondary | ICD-10-CM

## 2023-01-09 LAB — POCT URINALYSIS DIPSTICK
Bilirubin, UA: NEGATIVE
Blood, UA: NEGATIVE
Glucose, UA: NEGATIVE
Ketones, UA: NEGATIVE
Leukocytes, UA: NEGATIVE
Nitrite, UA: NEGATIVE
Protein, UA: NEGATIVE
Spec Grav, UA: <=1.005 — AB (ref 1.010–1.025)
Urobilinogen, UA: 0.2 U/dL
pH, UA: 6 (ref 5.0–8.0)

## 2023-01-09 MED ORDER — NITROFURANTOIN MONOHYD MACRO 100 MG PO CAPS
100.0000 mg | ORAL_CAPSULE | Freq: Two times a day (BID) | ORAL | 0 refills | Status: DC
Start: 2023-01-09 — End: 2023-01-09

## 2023-01-09 NOTE — Progress Notes (Unsigned)
Melissa Pruitt is a 47 y.o. female  arrived today with UTI sx.  Per Dr. Jari Favre protocol: A urine specimen was collected and POCT Urine was done and urine culture sent to the lab. POCT Urine was Positive for leukocytes  Pt was notified and prescription sent to the preferred pharmacy.

## 2023-01-09 NOTE — Progress Notes (Unsigned)
Pt said she would prefer to wait until the culture comes back

## 2023-01-11 LAB — URINE CULTURE: Culture: 40000 — AB

## 2023-01-12 MED ORDER — AMOXICILLIN 500 MG PO TABS
500.0000 mg | ORAL_TABLET | Freq: Two times a day (BID) | ORAL | 0 refills | Status: AC
Start: 2023-01-12 — End: 2023-01-17

## 2023-01-12 MED ORDER — CEPHALEXIN 250 MG PO CAPS
500.0000 mg | ORAL_CAPSULE | Freq: Two times a day (BID) | ORAL | 0 refills | Status: AC
Start: 2023-01-12 — End: 2023-01-17

## 2023-01-12 NOTE — Addendum Note (Signed)
Addended by: Marguerita Beards on: 01/12/2023 12:29 PM   Modules accepted: Orders

## 2023-01-15 ENCOUNTER — Encounter: Payer: Self-pay | Admitting: Obstetrics and Gynecology

## 2023-01-15 ENCOUNTER — Ambulatory Visit: Payer: Medicaid Other | Admitting: Obstetrics and Gynecology

## 2023-01-15 ENCOUNTER — Other Ambulatory Visit (HOSPITAL_COMMUNITY)
Admission: RE | Admit: 2023-01-15 | Discharge: 2023-01-15 | Disposition: A | Discharge status: Home / Self Care | Payer: Medicaid Other | Source: Other Acute Inpatient Hospital | Attending: Obstetrics and Gynecology | Admitting: Obstetrics and Gynecology

## 2023-01-15 VITALS — BP 94/64 | HR 66

## 2023-01-15 DIAGNOSIS — N390 Urinary tract infection, site not specified: Secondary | ICD-10-CM

## 2023-01-15 DIAGNOSIS — Q625 Duplication of ureter: Secondary | ICD-10-CM

## 2023-01-15 DIAGNOSIS — R350 Frequency of micturition: Secondary | ICD-10-CM

## 2023-01-15 LAB — POCT URINALYSIS DIPSTICK
Bilirubin, UA: NEGATIVE
Blood, UA: NEGATIVE
Glucose, UA: NEGATIVE
Ketones, UA: NEGATIVE
Leukocytes, UA: NEGATIVE
Nitrite, UA: NEGATIVE
Protein, UA: NEGATIVE
Spec Grav, UA: 1.01 (ref 1.010–1.025)
Urobilinogen, UA: 0.2 U/dL
pH, UA: 6.5 (ref 5.0–8.0)

## 2023-01-15 MED ORDER — LIDOCAINE HCL URETHRAL/MUCOSAL 2 % EX GEL
1.0000 | Freq: Once | CUTANEOUS | Status: AC
Start: 2023-01-15 — End: ?

## 2023-01-15 NOTE — Patient Instructions (Signed)
Taking Care of Yourself after Urodynamics, Cystoscopy, Bulkamid Injection, or Botox Injection   Drink plenty of water for a day or two following your procedure. Try to have about 8 ounces (one cup) at a time, and do this 6 times or more per day unless you have fluid restrictitons AVOID irritative beverages such as coffee, tea, soda, alcoholic or citrus drinks for a day or two, as this may cause burning with urination.  For the first 1-2 days after the procedure, your urine may be pink or red in color. You may have some blood in your urine as a normal side effect of the procedure. Large amounts of bleeding or difficulty urinating are NOT normal. Call the nurse line if this happens or go to the nearest Emergency Room if the bleeding is heavy or you cannot urinate at all and it is after hours.  You may experience some discomfort or a burning sensation with urination after having this procedure. You can use over the counter Azo or pyridium to help with burning and follow the instructions on the packaging. If it does not improve within 1-2 days, or other symptoms appear (fever, chills, or difficulty urinating) call the office to speak to a nurse.  You may return to normal daily activities such as work, school, driving, exercising and housework on the day of the procedure. If your doctor gave you a prescription, take it as ordered.     

## 2023-01-15 NOTE — Addendum Note (Signed)
Addended by: Marguerita Beards on: 01/15/2023 12:25 PM   Modules accepted: Orders

## 2023-01-15 NOTE — Progress Notes (Signed)
CYSTOSCOPY  CC:  This is a 47 y.o. with recurrent UTI who presents today for cystoscopy. Has history of meningomyelocele and right duplicated system.   POC urine negative- currently on antibiotics for E.Coli UTI  BP 94/64   Pulse 66   CYSTOSCOPY: A time out was performed.  The periurethral area was prepped and draped in a sterile manner.  2% lidocaine jetpack was inserted at the urethral meatus and the urethra and bladder visualized with 70-degree scopes.  She had normal urethral coaptation and normal urethral mucosa with no visible connection to the suburethral cyst.  She had normal bladder mucosa- mild erythema noted on the right bladder sidewall but no raised areas present. 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 Brisk efflux was also noted from the left ureter.  Squamous metaplasia was noted at the trigone. A bulge was noted on the posterior bladder wall, likely consistent with fibroid uterus. There were no trabeculations, cellules or diverticuli.     ASSESSMENT:  47 y.o. with recurrent UTI and neurogenic bladder . Cystoscopy today is  stable from prior cystoscopy .  PLAN:  - Cystoscopy today consistent with prior. We discussed that CT imaging did not comment on right duplicated system- unclear if the second ureter wa appropriately visualized.  - Will send to urology for discussion regarding need for any additional imaging or continued monitoring. Referral placed to [facility]  Marguerita Beards, MD

## 2023-01-15 NOTE — Addendum Note (Signed)
Addended by: Salina April on: 01/15/2023 11:56 AM   Modules accepted: Orders

## 2023-01-16 ENCOUNTER — Ambulatory Visit: Payer: Medicaid Other | Attending: Neurology | Admitting: Physical Therapy

## 2023-01-16 ENCOUNTER — Encounter: Payer: Self-pay | Admitting: Physical Therapy

## 2023-01-16 ENCOUNTER — Other Ambulatory Visit: Payer: Self-pay

## 2023-01-16 DIAGNOSIS — R293 Abnormal posture: Secondary | ICD-10-CM | POA: Diagnosis present

## 2023-01-16 DIAGNOSIS — M6281 Muscle weakness (generalized): Secondary | ICD-10-CM | POA: Diagnosis present

## 2023-01-16 DIAGNOSIS — R102 Pelvic and perineal pain: Secondary | ICD-10-CM | POA: Insufficient documentation

## 2023-01-16 DIAGNOSIS — R2681 Unsteadiness on feet: Secondary | ICD-10-CM | POA: Insufficient documentation

## 2023-01-16 LAB — URINE CULTURE: Culture: NO GROWTH

## 2023-01-16 NOTE — Therapy (Unsigned)
OUTPATIENT PHYSICAL THERAPY LOWER EXTREMITY EVALUATION   Patient Name: Melissa Pruitt MRN: [id number] DOB:11/08/1975, 47 y.o., female Today's Date: 01/16/2023  END OF SESSION:   01/16/23 1236  PT Visits / Re-Eval  Visit Number 1  Number of Visits 9 (8 + eval)  Date for PT Re-Evaluation 03/20/23 (pushed out due to scheduling)  Authorization  Authorization Type [employer]  PT Time Calculation  PT Start Time 1230  PT Stop Time 1317  PT Time Calculation (min) 47 min  PT - End of Session  Equipment Utilized During Treatment Gait belt  Activity Tolerance Patient tolerated treatment well  Behavior During Therapy [facility] for tasks assessed/performed    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   R mid sole superior moderate atypia    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   R med sole inferior moderate atypia    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   R dorsum lat great toe   Dysplastic nevus 06/11/2020   left prox lat thigh sup, - severe   Dysplastic nevus 06/11/2020   left prox lat thigh inf, - moderate   Dysplastic nevus 06/11/2020   R mid to upper back 2.0cm lat to spine, Severe atypia   Dysplastic nevus 06/11/2020   R distal post thigh, moderate atypia   Dysplastic nevus 08/23/2020   Right labia - mild   Dysplastic nevus 08/12/2021   R lat neck post, moderate atypia   Dysplastic nevus 06/11/2012   L dorsal foot, mild atypia, bx from Dr. Larey Dresser   History of recurrent UTIs    HPV (human papilloma virus) infection    HSV (herpes simplex virus) infection    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[facility])    sacro/coccygeal mass excision   Melanoma (HCC) 02/16/2020   R mid plantar sole, BRESLOW'S DEPTH/MAXIMUM TUMOR THICKNESS: 0.5 MM, CLARK/ANATOMIC LEVEL: II Excised 03/20/2020   Normocytic anemia 08/21/2014   Tethered spinal cord (HCC)    Vitamin B 12 deficiency 08/21/2014    Past Surgical History:  Procedure Laterality Date   BACK SURGERY     BLADDER SURGERY     COLOSTOMY     after flex sig as 46 year old.    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   Melanoma (HCC) 07/02/2020   Pain in joint of left shoulder 11/08/2019   Cervical radiculopathy 05/24/2019   Trigeminal neuralgia 11/05/2018   Symptomatic states associated with artificial menopause 11/05/2018   Arthralgia of hand, right 11/17/2017   Vitamin D deficiency 02/18/2017   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   Other specified disorders of uterus 10/12/2015   Herpes simplex 06/27/2015   Hyperlipidemia 05/30/2015   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   Normocytic anemia 08/21/2014   Vitamin B 12 deficiency 08/21/2014   Allergic rhinitis 01/19/2014   Condyloma acuminatum 01/09/2014   Chronic pain 08/09/2013   Cervical dysplasia 05/09/2013   Other headache syndrome 05/09/2013   Arthralgia of temporomandibular joint 05/09/2013  Cervical pain 08/25/2012   Pain in joint, pelvic region and thigh 07/16/2012   Tethered spinal cord (HCC) 07/16/2012   Balance disorder 07/16/2012   Personal history of fall 07/16/2012   Neurogenic bladder 06/25/2011   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   OTHER SPECIFIED CONGENITAL ANOMALY SPINAL CORD 05/08/2010   Congenital anomaly of brain, spinal cord, 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   H N P-LUMBAR 04/23/2010   Backache 01/28/2010   Papanicolaou smear of cervix with atypical squamous cells cannot exclude high grade squamous intraepithelial lesion (ASC-H) 04/14/1898    PCP: [facility]  REFERRING PROVIDER: Janice Coffin, PA-C  REFERRING DIAG: [phone] (ICD-10-CM) - Lower extremity weakness  THERAPY DIAG:  Muscle weakness (generalized)  Unsteadiness on feet  Rationale for Evaluation and Treatment: Rehabilitation  ONSET DATE: November 2021 when initial foot cancer removed  SUBJECTIVE:   SUBJECTIVE STATEMENT: Pt states her right leg is "wet noodly" or "gimpy" moreso since her cancer was removed from the bottom of that foot.  She is ambulating no AD today and reports not wearing bracing (she has several) unless doing specific exercises.  PERTINENT HISTORY: Myelomeningocele with surgical repair (w/o detethering),  self-catheterizes for neurogenic bladder, Cervical cancer, HPV, recurrent UTIs, HSV , chronic rt hip pain, right foot melanoma s/p excision, trigeminal neuralgia, left mild chronic C7 radiculopathy  PAIN:  Are you having pain? Yes: NPRS scale: 7-8/10 Pain location: low back over scar Pain description: radiating into right leg Aggravating factors: standing still, sitting for long periods of time Relieving factors: laying down, ice and heat  PRECAUTIONS: Fall  RED FLAGS: Bowel or bladder incontinence: Yes: self-caths and self-braces for bowel movements    WEIGHT BEARING RESTRICTIONS: No  FALLS:  Has patient fallen in last 6 months? No - did have a significant fall down steps in Spring 2022  LIVING ENVIRONMENT: Lives with: lives with their daughter Lives in: House/apartment Stairs: Yes: External: 3 steps; none Has following equipment at home: Single point cane, Shower bench, and Grab bars  OCCUPATION: disabled  PLOF: Independent  PATIENT GOALS: "To minimize some of the new pain"  NEXT MD VISIT: Melissa Evener, MD 01/26/2023 (MD that manages excision site)  OBJECTIVE:  Note: Objective measures were completed at Evaluation unless otherwise noted.  DIAGNOSTIC FINDINGS: No recent relevant imaging.  PATIENT SURVEYS:   LEFS 47/80 = moderate level of difficulty  COGNITION: Overall cognitive status: Within functional limits for tasks assessed     SENSATION: Light touch: WFL  EDEMA:  None noted in BLE  POSTURE: No Significant postural limitations  PALPATION: She has radiating nerve type pain when pressing on excision upward into shin  LOWER EXTREMITY ROM:  Active ROM Right eval Left eval  Hip flexion    Hip extension    Hip abduction    Hip adduction    Hip internal rotation    Hip external rotation    Knee flexion    Knee extension    Ankle dorsiflexion Limited in DF and toe extension; hammer toe more pronounced on digits 4-5   Ankle plantarflexion    Ankle inversion    Ankle eversion     (Blank rows = not tested)  LOWER EXTREMITY MMT:  MMT Right eval Left eval  Hip flexion 3+/5 4+/5  Hip extension    Hip abduction 3+/5 4/5  Hip adduction    Hip internal rotation    Hip external rotation    Knee flexion    Knee extension 4-/5  5/5  Ankle dorsiflexion 2/5 4+/5  Ankle plantarflexion    Ankle inversion    Ankle eversion     (Blank rows = not tested)  LOWER EXTREMITY SPECIAL TESTS:  Ankle special tests: None appropriate for AROM on RLE and chief complaint  FUNCTIONAL TESTS:  5 times sit to stand: 18.57 sec w/ light BUE support MCTSIB: Condition 1: Avg of 3 trials: 30 sec, Condition 2: Avg of 3 trials: 30 sec, Condition 3: Avg of 3 trials: 30 sec, Condition 4: Avg of 3 trials: 8.09 sec, and Total Score: 98.09/120  GAIT: Distance walked: Various clinic distances Assistive device utilized: None Level of assistance: Complete Independence Comments: No notable deviations or LOB on entering/exiting gym.   TODAY'S TREATMENT:                                                                                                                              DATE: N/A - eval only.    PATIENT EDUCATION:  Education details: PT POC, assessments used, and goals to be set.  Discussed pt  reaching out to MD about prescription for new orthotic inserts w/ [employer] as previous ones are worn out. Person educated: Patient Education method: Explanation Education comprehension: verbalized understanding  HOME EXERCISE PROGRAM: To be established.  ASSESSMENT:  CLINICAL IMPRESSION: Patient is a 47 y.o. female who was seen today for physical therapy evaluation and treatment for RLE weakness.  Pt has a significant PMH of Myelomeningocele with surgical repair (w/o detethering),  self-catheterizes for neurogenic bladder, Cervical cancer, HPV, recurrent UTIs, HSV , chronic rt hip pain, right foot melanoma s/p excision, trigeminal neuralgia, and left mild chronic C7 radiculopathy.  Identified impairments include low back pain, scar tightness w/ radiating nerve pain, decreased R DF strength and ROM, and other mild BLE weakness.  Evaluation via the following assessment tools: 5xSTS and mCTSIB indicate elevated fall risk.  She would benefit from skilled PT to address impairments as noted and progress towards long term goals.  OBJECTIVE IMPAIRMENTS: decreased activity tolerance, decreased balance, decreased coordination, difficulty walking, decreased ROM, and decreased strength.   ACTIVITY LIMITATIONS: standing, squatting, stairs, transfers, and locomotion level  PARTICIPATION LIMITATIONS: community activity and occupation  PERSONAL FACTORS: Fitness, Past/current experiences, and 1-2 comorbidities: myelomeningocele s/p surgical excision and right foot melanoma s/p excision  are also affecting patient's functional outcome.   REHAB POTENTIAL: Excellent  CLINICAL DECISION MAKING: Stable/uncomplicated  EVALUATION COMPLEXITY: Low   GOALS: Goals reviewed with patient? Yes  SHORT TERM GOALS: Target date: 02/13/2023 Pt will be independent and compliant with initial LE strength and balance HEP in order to maintain functional progress and improve mobility. Baseline:  To be established. Goal status:  INITIAL  2.  Pt will demonstrate adequate scar mobilization technique for right foot pain management. Baseline:  Handout to be provided. Goal status: INITIAL  3.  Pt will decrease 5xSTS to </=15.57 seconds w/o UE support in order to demonstrate decreased risk for  falls and improved functional bilateral LE strength and power. Baseline: 18.57 sec w/ light BUE support Goal status: INITIAL  4.  Pt will complete condition 4 of mCTSIB with an average of 3 trials >/= 15 seconds in order to demonstrate improved vestibular input.   Baseline:  8.09 sec Goal status: INITIAL  LONG TERM GOALS: Target date: 03/13/2023  Pt will be independent and compliant with advanced LE strength and balance HEP in order to maintain functional progress and improve mobility. Baseline:  To be established. Goal status: INITIAL  2.  Pt will decrease 5xSTS to </=12.57 seconds w/o UE support in order to demonstrate decreased risk for falls and improved functional bilateral LE strength and power. Baseline: 18.57 sec w/ light BUE support Goal status: INITIAL  3.  Pt will complete condition 4 of mCTSIB with an average of 3 trials >/= 30 seconds in order to demonstrate improved vestibular input.  Baseline: 8.09 sec Goal status: INITIAL  PLAN:  PT FREQUENCY: 1x/week  PT DURATION: 8 weeks  PLANNED INTERVENTIONS: Therapeutic exercises, Therapeutic activity, Neuromuscular re-education, Balance training, Gait training, Patient/Family education, Self Care, Joint mobilization, Stair training, Vestibular training, Orthotic/Fit training, DME instructions, Dry Needling, Electrical stimulation, Moist heat, scar mobilization, Taping, Manual therapy, and Re-evaluation  PLAN FOR NEXT SESSION: Initiate HEP, static balance FT EC, scar mobilization, functional strengthening   Sadie Haber, PT, DPT 01/16/2023, 1:17 PM

## 2023-01-26 ENCOUNTER — Ambulatory Visit: Payer: Medicaid Other | Admitting: Dermatology

## 2023-01-26 VITALS — BP 90/61 | HR 59

## 2023-01-26 DIAGNOSIS — Z85820 Personal history of malignant melanoma of skin: Secondary | ICD-10-CM

## 2023-01-26 DIAGNOSIS — Z87828 Personal history of other (healed) physical injury and trauma: Secondary | ICD-10-CM

## 2023-01-26 DIAGNOSIS — Z1283 Encounter for screening for malignant neoplasm of skin: Secondary | ICD-10-CM | POA: Diagnosis not present

## 2023-01-26 DIAGNOSIS — Z86018 Personal history of other benign neoplasm: Secondary | ICD-10-CM

## 2023-01-26 DIAGNOSIS — L821 Other seborrheic keratosis: Secondary | ICD-10-CM

## 2023-01-26 DIAGNOSIS — D492 Neoplasm of unspecified behavior of bone, soft tissue, and skin: Secondary | ICD-10-CM | POA: Diagnosis not present

## 2023-01-26 DIAGNOSIS — D485 Neoplasm of uncertain behavior of skin: Secondary | ICD-10-CM

## 2023-01-26 DIAGNOSIS — L578 Other skin changes due to chronic exposure to nonionizing radiation: Secondary | ICD-10-CM | POA: Diagnosis not present

## 2023-01-26 DIAGNOSIS — W908XXA Exposure to other nonionizing radiation, initial encounter: Secondary | ICD-10-CM

## 2023-01-26 DIAGNOSIS — L814 Other melanin hyperpigmentation: Secondary | ICD-10-CM

## 2023-01-26 DIAGNOSIS — D1801 Hemangioma of skin and subcutaneous tissue: Secondary | ICD-10-CM

## 2023-01-26 DIAGNOSIS — D2271 Melanocytic nevi of right lower limb, including hip: Secondary | ICD-10-CM

## 2023-01-26 DIAGNOSIS — D229 Melanocytic nevi, unspecified: Secondary | ICD-10-CM

## 2023-01-26 DIAGNOSIS — L905 Scar conditions and fibrosis of skin: Secondary | ICD-10-CM

## 2023-01-26 NOTE — Progress Notes (Unsigned)
Follow-Up Visit   Subjective  Melissa Pruitt is a 47 y.o. female who presents for the following: Skin Cancer Screening and Full Body Skin Exam  The patient presents for Total-Body Skin Exam (TBSE) for skin cancer screening and mole check. The patient has spots, moles and lesions to be evaluated, some may be new or changing and the patient may have concern these could be cancer. Patient has a history of melanoma of the right mid plantar sole, WLE 03/20/2020. Patient starts physical therapy tomorrow for pain in the right foot. History of multiple dysplastic nevi. Patient would like new moles checked on the right lateral foot.   The following portions of the chart were reviewed this encounter and updated as appropriate: medications, allergies, medical history  Review of Systems:  No other skin or systemic complaints except as noted in HPI or Assessment and Plan.  Objective  Well appearing patient in no apparent distress; mood and affect are within normal limits.  A full examination was performed including scalp, head, eyes, ears, nose, lips, neck, chest, axillae, abdomen, back, buttocks, bilateral upper extremities, bilateral lower extremities, hands, feet, fingers, toes, fingernails, and toenails. All findings within normal limits unless otherwise noted below.   Relevant physical exam findings are noted in the Assessment and Plan.  Right lateral sole anterior 0.6 CM brown macule       Right lateral sole posterior 0.6 CM brown macule    Assessment & Plan   SKIN CANCER SCREENING PERFORMED TODAY.  ACTINIC DAMAGE - Chronic condition, secondary to cumulative UV/sun exposure - diffuse scaly erythematous macules with underlying dyspigmentation - Recommend daily broad spectrum sunscreen SPF 30+ to sun-exposed areas, reapply every 2 hours as needed.  - Staying in the shade or wearing long sleeves, sun glasses (UVA+UVB protection) and wide brim hats (4-inch brim around the entire  circumference of the hat) are also recommended for sun protection.  - Call for new or changing lesions.  LENTIGINES, SEBORRHEIC KERATOSES, HEMANGIOMAS - Benign normal skin lesions - Benign-appearing - Call for any changes  MELANOCYTIC NEVI - Tan-brown and/or pink-flesh-colored symmetric macules and papules - Benign appearing on exam today - Observation - Call clinic for new or changing moles - Recommend daily use of broad spectrum spf 30+ sunscreen to sun-exposed areas.   Scar (2) L dorsal foot; L buttock   L dorsal foot - Benign, secondary from Dysplastic Nevus, mild atypia, bx by Dr. Larey Dresser on 06/11/2012 (path report is in media).   L buttock - Benign, secondary to bx proven Benign Nevus 05/24/2014  HISTORY OF MELANOMA Right mid plantar sole BRESLOW'S DEPTH/MAXIMUM TUMOR THICKNESS: 0.5 MM, CLARK/ANATOMIC LEVEL: II Excised 03/20/2020  - No evidence of recurrence today - No lymphadenopathy - Recommend regular full body skin exams - Recommend daily broad spectrum sunscreen SPF 30+ to sun-exposed areas, reapply every 2 hours as needed.  - Call if any new or changing lesions are noted between office visits  History of Dysplastic Nevi - No evidence of recurrence today - Recommend regular full body skin exams - Recommend daily broad spectrum sunscreen SPF 30+ to sun-exposed areas, reapply every 2 hours as needed.  - Call if any new or changing lesions are noted between office visits  HISTORY OF TICK BITES Recommend Insect Shield Clothing when outdoors. Info given to patient.  Patient was tested for RMSF and Lyme Disease 12/19/2022, negative. Patient has orders at [facility] to repeat tests. Patient treated with doxycycline.   Patient positive for alpha  gal, tested by another doctor.   Neoplasm of uncertain behavior of skin (2) Right lateral sole anterior  Epidermal / dermal shaving  Lesion diameter (cm):  0.6 Informed consent: discussed and consent obtained   Timeout:  patient name, date of birth, surgical site, and procedure verified   Procedure prep:  Patient was prepped and draped in usual sterile fashion Prep type:  Isopropyl alcohol Anesthesia: the lesion was anesthetized in a standard fashion   Anesthetic:  1% lidocaine w/ epinephrine 1-100,000 buffered w/ 8.4% NaHCO3 Instrument used: flexible razor blade   Hemostasis achieved with: pressure, aluminum chloride and electrodesiccation   Outcome: patient tolerated procedure well   Post-procedure details: sterile dressing applied and wound care instructions given   Dressing type: bandage and petrolatum    Specimen 1 - Surgical pathology Differential Diagnosis: Nevus vs Dysplastic Nevus Check Margins: Yes Pt with hx of melanoma  Right lateral sole posterior  Epidermal / dermal shaving  Lesion diameter (cm):  0.6 Informed consent: discussed and consent obtained   Timeout: patient name, date of birth, surgical site, and procedure verified   Procedure prep:  Patient was prepped and draped in usual sterile fashion Prep type:  Isopropyl alcohol Anesthesia: the lesion was anesthetized in a standard fashion   Anesthetic:  1% lidocaine w/ epinephrine 1-100,000 buffered w/ 8.4% NaHCO3 Instrument used: flexible razor blade   Hemostasis achieved with: pressure, aluminum chloride and electrodesiccation   Outcome: patient tolerated procedure well   Post-procedure details: sterile dressing applied and wound care instructions given   Dressing type: bandage and petrolatum    Specimen 2 - Surgical pathology Differential Diagnosis: Nevus vs Dysplastic Nevus Check Margins: Yes Pt with hx of melanoma  Shave biopsy x 2, new moles per patient.    Return in about 6 months (around 07/27/2023) for TBSE, Hx melanoma, Hx Dysplastic Nevus.  Wendee Beavers, CMA, am acting as scribe for Armida Sans, MD .   Documentation: I have reviewed the above documentation for accuracy and completeness, and I agree with  the above.  Armida Sans, MD

## 2023-01-26 NOTE — Patient Instructions (Addendum)
Recommend [employer] when outdoors. [email or web address]    Wound Care Instructions  Cleanse wound gently with soap and water once a day then pat dry with clean gauze. Apply a thin coat of Petrolatum (petroleum jelly, "Vaseline") over the wound (unless you have an allergy to this). We recommend that you use a new, sterile tube of Vaseline. Do not pick or remove scabs. Do not remove the yellow or white "healing tissue" from the base of the wound.  Cover the wound with fresh, clean, nonstick gauze and secure with paper tape. You may use Band-Aids in place of gauze and tape if the wound is small enough, but would recommend trimming much of the tape off as there is often too much. Sometimes Band-Aids can irritate the skin.  You should call the office for your biopsy report after 1 week if you have not already been contacted.  If you experience any problems, such as abnormal amounts of bleeding, swelling, significant bruising, significant pain, or evidence of infection, please call the office immediately.  FOR ADULT SURGERY PATIENTS: If you need something for pain relief you may take 1 extra strength Tylenol (acetaminophen) AND 2 Ibuprofen (200mg  each) together every 4 hours as needed for pain. (do not take these if you are allergic to them or if you have a reason you should not take them.) Typically, you may only need pain medication for 1 to 3 days.    Melanoma ABCDEs  Melanoma is the most dangerous type of skin cancer, and is the leading cause of death from skin disease.  You are more likely to develop melanoma if you: Have light-colored skin, light-colored eyes, or red or blond hair Spend a lot of time in the sun Tan regularly, either outdoors or in a tanning bed Have had blistering sunburns, especially during childhood Have a close family member who has had a melanoma Have atypical moles or large birthmarks  Early detection of melanoma is key since treatment is  typically straightforward and cure rates are extremely high if we catch it early.   The first sign of melanoma is often a change in a mole or a new dark spot.  The ABCDE system is a way of remembering the signs of melanoma.  A for asymmetry:  The two halves do not match. B for border:  The edges of the growth are irregular. C for color:  A mixture of colors are present instead of an even brown color. D for diameter:  Melanomas are usually (but not always) greater than 6mm - the size of a pencil eraser. E for evolution:  The spot keeps changing in size, shape, and color.  Please check your skin once per month between visits. You can use a small mirror in front and a large mirror behind you to keep an eye on the back side or your body.   If you see any new or changing lesions before your next follow-up, please call to schedule a visit.  Please continue daily skin protection including broad spectrum sunscreen SPF 30+ to sun-exposed areas, reapplying every 2 hours as needed when you're outdoors.   Staying in the shade or wearing long sleeves, sun glasses (UVA+UVB protection) and wide brim hats (4-inch brim around the entire circumference of the hat) are also recommended for sun protection.    Due to recent changes in healthcare laws, you may see results of your pathology and/or laboratory studies on MyChart before the doctors have had a chance to review them.  We understand that in some cases there may be results that are confusing or concerning to you. Please understand that not all results are received at the same time and often the doctors may need to interpret multiple results in order to provide you with the best plan of care or course of treatment. Therefore, we ask that you please give [place] 2 business days to thoroughly review all your results before contacting the office for clarification. Should we see a critical lab result, you will be contacted sooner.   If You Need Anything After Your  Visit  If you have any questions or concerns for your doctor, please call our main line at [phone] and press option 4 to reach your doctor's medical assistant. If no one answers, please leave a voicemail as directed and we will return your call as soon as possible. Messages left after 4 pm will be answered the following business day.   You may also send [place] a message via MyChart. We typically respond to MyChart messages within 1-2 business days.  For prescription refills, please ask your pharmacy to contact our office. Our fax number is [phone].  If you have an urgent issue when the clinic is closed that cannot wait until the next business day, you can page your doctor at the number below.    Please note that while we do our best to be available for urgent issues outside of office hours, we are not available 24/7.   If you have an urgent issue and are unable to reach [place], you may choose to seek medical care at your doctor's office, retail clinic, urgent care center, or emergency room.  If you have a medical emergency, please immediately call 911 or go to the emergency department.  Pager Numbers  - Dr. Gwen Pounds: [phone]  - Dr. Roseanne Reno: [phone]  - Dr. Katrinka Blazing: [phone]   In the event of inclement weather, please call our main line at [phone] for an update on the status of any delays or closures.  Dermatology Medication Tips: Please keep the boxes that topical medications come in in order to help keep track of the instructions about where and how to use these. Pharmacies typically print the medication instructions only on the boxes and not directly on the medication tubes.   If your medication is too expensive, please contact our office at [phone] option 4 or send [place] a message through MyChart.   We are unable to tell what your co-pay for medications will be in advance as this is different depending on your insurance coverage. However, we may be able to find a  substitute medication at lower cost or fill out paperwork to get insurance to cover a needed medication.   If a prior authorization is required to get your medication covered by your insurance company, please allow [place] 1-2 business days to complete this process.  Drug prices often vary depending on where the prescription is filled and some pharmacies may offer cheaper prices.  The website www.goodrx.com contains coupons for medications through different pharmacies. The prices here do not account for what the cost may be with help from insurance (it may be cheaper with your insurance), but the website can give you the price if you did not use any insurance.  - You can print the associated coupon and take it with your prescription to the pharmacy.  - You may also stop by our office during regular business hours and pick up a GoodRx coupon card.  -  If you need your prescription sent electronically to a different pharmacy, notify our office through [facility] or by phone at [phone] option 4.     Si Usted Necesita Algo Despus de Su Visita  Tambin puede enviarnos un mensaje a travs de Clinical cytogeneticist. Por lo general respondemos a los mensajes de MyChart en el transcurso de 1 a 2 das hbiles.  Para renovar recetas, por favor pida a su farmacia que se ponga en contacto con nuestra oficina. Annie Sable de fax es [place] [phone].  Si tiene un asunto urgente cuando la clnica est cerrada y que no puede esperar hasta el siguiente da hbil, puede llamar/localizar a su doctor(a) al nmero que aparece a continuacin.   Por favor, tenga en cuenta que aunque hacemos todo lo posible para estar disponibles para asuntos urgentes fuera del horario de [place], no estamos disponibles las 24 horas del da, los 7 [address]  [address] de la [place].   Si tiene un problema urgente y no puede comunicarse con nosotros, puede optar por buscar atencin mdica  en el consultorio de su doctor(a), en una clnica privada, en un  centro de atencin urgente o en una sala de emergencias.  Si tiene Engineer, drilling, por favor llame inmediatamente al 911 o vaya a la sala de emergencias.  Nmeros de bper  - Dr. Gwen Pounds: [phone]  - Dra. Roseanne Reno: [phone]  - Dr. Katrinka Blazing: [phone]   En caso de inclemencias del tiempo, por favor llame a Lacy Duverney principal al [phone] para una actualizacin sobre el [place] de cualquier retraso o cierre.  Consejos para la medicacin en dermatologa: Por favor, guarde las cajas en las que vienen los medicamentos de uso tpico para ayudarle a seguir las instrucciones sobre dnde y cmo usarlos. Las farmacias generalmente imprimen las instrucciones del medicamento slo en las cajas y no directamente en los tubos del [place].   Si su medicamento es muy caro, por favor, pngase en contacto con Rolm Gala llamando al [phone] y presione la opcin 4 o envenos un mensaje a travs de Clinical cytogeneticist.   No podemos decirle cul ser su copago por los medicamentos por adelantado ya que esto es diferente dependiendo de la cobertura de su seguro. Sin embargo, es posible que podamos encontrar un medicamento sustituto a Audiological scientist un formulario para que el seguro cubra el medicamento que se considera necesario.   Si se requiere una autorizacin previa para que su compaa de seguros [place] su medicamento, por favor permtanos de 1 a 2 das hbiles para completar [address]  Los precios de los medicamentos varan con frecuencia dependiendo del Environmental consultant de dnde se surte la receta y alguna farmacias pueden ofrecer precios ms baratos.  El sitio web www.goodrx.com tiene cupones para medicamentos de Health and safety inspector. Los precios aqu no tienen en cuenta lo que podra costar con la ayuda del seguro (puede ser ms barato con su seguro), pero el sitio web puede darle el precio si no utiliz Tourist information centre manager.  - Puede imprimir el cupn correspondiente y llevarlo con su  receta a la farmacia.  - Tambin puede pasar por nuestra oficina durante el horario de atencin regular y Education officer, museum una tarjeta de cupones de GoodRx.  - Si necesita que su receta se enve electrnicamente a una farmacia diferente, informe a nuestra oficina a travs de MyChart de [place] o por telfono llamando al [phone] y presione la opcin 4.

## 2023-01-27 ENCOUNTER — Ambulatory Visit: Payer: Medicaid Other

## 2023-01-27 ENCOUNTER — Encounter: Payer: Self-pay | Admitting: Physical Therapy

## 2023-01-27 ENCOUNTER — Ambulatory Visit: Payer: Medicaid Other | Admitting: Physical Therapy

## 2023-01-27 DIAGNOSIS — R2681 Unsteadiness on feet: Secondary | ICD-10-CM

## 2023-01-27 DIAGNOSIS — R293 Abnormal posture: Secondary | ICD-10-CM

## 2023-01-27 DIAGNOSIS — M6281 Muscle weakness (generalized): Secondary | ICD-10-CM | POA: Diagnosis not present

## 2023-01-27 DIAGNOSIS — R102 Pelvic and perineal pain: Secondary | ICD-10-CM

## 2023-01-27 NOTE — Therapy (Signed)
OUTPATIENT PHYSICAL THERAPY LOWER EXTREMITY TREATMENT   Patient Name: Melissa Pruitt MRN: [id number] DOB:Sep 30, 1975, 47 y.o., female Today's Date: 01/27/2023  END OF SESSION:   PT End of Session - 01/27/23 1452     Visit Number 2    Number of Visits 9   8 + eval   Date for PT Re-Evaluation 03/20/23   pushed out due to scheduling   Authorization Type [employer]    PT Start Time 1447    PT Stop Time 1534    PT Time Calculation (min) 47 min    Equipment Utilized During Treatment Gait belt    Activity Tolerance Patient tolerated treatment well    Behavior During Therapy [facility] for tasks assessed/performed               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   R mid sole superior moderate atypia    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   R med sole inferior moderate atypia    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   R dorsum lat great toe   Dysplastic nevus 06/11/2020   left prox lat thigh sup, - severe   Dysplastic nevus 06/11/2020   left prox lat thigh inf, - moderate   Dysplastic nevus 06/11/2020   R mid to upper back 2.0cm lat to spine, Severe atypia   Dysplastic nevus 06/11/2020   R distal post thigh, moderate atypia   Dysplastic nevus 08/23/2020   Right labia - mild   Dysplastic nevus 08/12/2021   R lat neck post, moderate atypia   Dysplastic nevus 06/11/2012   L dorsal foot, mild atypia, bx from Dr. Larey Dresser   History of recurrent UTIs    HPV (human papilloma virus) infection    HSV (herpes simplex virus) infection    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[facility])    sacro/coccygeal mass excision   Melanoma (HCC) 02/16/2020   R mid plantar sole, BRESLOW'S DEPTH/MAXIMUM TUMOR THICKNESS: 0.5 MM, CLARK/ANATOMIC LEVEL: II Excised 03/20/2020   Normocytic anemia 08/21/2014   Tethered spinal cord (HCC)    Vitamin B 12 deficiency 08/21/2014   Past Surgical History:   Procedure Laterality Date   BACK SURGERY     BLADDER SURGERY     COLOSTOMY     after flex sig as 47 year old.    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   Melanoma (HCC) 07/02/2020   Pain in joint of left shoulder 11/08/2019   Cervical radiculopathy 05/24/2019   Trigeminal neuralgia 11/05/2018   Symptomatic states associated with artificial menopause 11/05/2018   Arthralgia of hand, right 11/17/2017   Vitamin D deficiency 02/18/2017   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   Other specified disorders of uterus 10/12/2015   Herpes simplex 06/27/2015   Hyperlipidemia 05/30/2015   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   Normocytic anemia 08/21/2014   Vitamin B 12 deficiency 08/21/2014   Allergic rhinitis 01/19/2014   Condyloma acuminatum 01/09/2014  Chronic pain 08/09/2013   Cervical dysplasia 05/09/2013   Other headache syndrome 05/09/2013   Arthralgia of temporomandibular joint 05/09/2013   Cervical pain 08/25/2012   Pain in joint, pelvic region and thigh 07/16/2012   Tethered spinal cord (HCC) 07/16/2012   Balance disorder 07/16/2012   Personal history of fall 07/16/2012   Neurogenic bladder 06/25/2011   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[facility]) 12/23/2010   OTHER SPECIFIED CONGENITAL ANOMALY SPINAL CORD 05/08/2010   Congenital anomaly of brain, spinal cord, 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   H N P-LUMBAR 04/23/2010   Backache 01/28/2010   Papanicolaou smear of cervix with atypical squamous cells cannot exclude high grade squamous intraepithelial lesion (ASC-H) 04/14/1898    PCP: [facility]  REFERRING PROVIDER: Janice Coffin, PA-C  REFERRING DIAG: [phone] (ICD-10-CM) - Lower extremity weakness  THERAPY DIAG:  Muscle weakness (generalized)  Unsteadiness on feet  Pelvic pain  Abnormal posture  Rationale for Evaluation and Treatment: Rehabilitation  ONSET DATE: November 2021 when initial foot cancer removed  SUBJECTIVE:   SUBJECTIVE STATEMENT: Pt brings her braces to session today.  She had 2 spots removed from right lateral foot yesterday, but states she is able to wear a regular shoe without precautions.  She has minor burning over this area where removal was.  She denies recent falls or other changes.  PERTINENT HISTORY: Myelomeningocele with surgical repair (w/o detethering),  self-catheterizes for neurogenic bladder, Cervical cancer, HPV, recurrent UTIs, HSV , chronic rt hip pain, right foot melanoma s/p excision, trigeminal neuralgia, left mild chronic C7 radiculopathy  PAIN:  Are you having pain? Yes: NPRS scale: 8/10 Pain location: low back to right buttcheek and foot Pain description: radiating into right leg Aggravating factors: standing still, sitting for long periods of time Relieving factors: laying down, ice and heat  PRECAUTIONS: Fall  RED FLAGS: Bowel or bladder incontinence: Yes: self-caths and self-braces for bowel movements    WEIGHT BEARING RESTRICTIONS: No  FALLS:  Has patient fallen in last 6 months? No - did have a significant fall down steps in Spring 2022  LIVING ENVIRONMENT: Lives with: lives with their daughter Lives in: House/apartment Stairs: Yes: External: 3 steps; none Has following equipment at home: Single point cane, Shower bench, and Grab bars  OCCUPATION: disabled  PLOF: Independent  PATIENT GOALS: "To minimize some of the new pain"  NEXT MD VISIT: Deirdre Evener, MD 01/26/2023 (MD that manages excision site)  OBJECTIVE:  Note: Objective measures were completed at Evaluation unless otherwise  noted.  DIAGNOSTIC FINDINGS: No recent relevant imaging.  PATIENT SURVEYS:  LEFS 47/80 = moderate level of difficulty  COGNITION: Overall cognitive status: Within functional limits for tasks assessed     SENSATION: Light touch: WFL  EDEMA:  None noted in BLE  POSTURE: No Significant postural limitations  PALPATION: She has radiating nerve type pain when pressing on excision upward into shin  LOWER EXTREMITY ROM:  Active ROM Right eval Left eval  Hip flexion    Hip extension    Hip abduction    Hip adduction    Hip internal rotation    Hip external rotation    Knee flexion    Knee extension    Ankle dorsiflexion Limited in DF and toe extension; hammer toe more pronounced on digits 4-5   Ankle plantarflexion    Ankle inversion    Ankle eversion     (Blank rows = not tested)  LOWER EXTREMITY MMT:  MMT Right eval Left eval  Hip flexion 3+/5 4+/5  Hip extension    Hip abduction 3+/5 4/5  Hip adduction    Hip internal rotation    Hip external rotation    Knee flexion    Knee extension 4-/5 5/5  Ankle dorsiflexion 2/5 4+/5  Ankle plantarflexion    Ankle inversion    Ankle eversion     (Blank rows = not tested)  LOWER EXTREMITY SPECIAL TESTS:  Ankle special tests: None appropriate for AROM on RLE and chief complaint  FUNCTIONAL TESTS:  5 times sit to stand: 18.57 sec w/ light BUE support MCTSIB: Condition 1: Avg of 3 trials: 30 sec, Condition 2: Avg of 3 trials: 30 sec, Condition 3: Avg of 3 trials: 30 sec, Condition 4: Avg of 3 trials: 8.09 sec, and Total Score: 98.09/120  GAIT: Distance walked: Various clinic distances Assistive device utilized: None Level of assistance: Complete Independence Comments: No notable deviations or LOB on entering/exiting gym.   TODAY'S TREATMENT:                                                                                                                              DATE: 01/27/2023 -PT reviewed ingredients in deep  prep prior to initiation of scar mobilization to plantar aspect of right foot, pt initially feeling these were fine then told PT she has an animal byproduct allergy (intermittent mild reaction per report) and was concerned about lanolin in deep prep.  Manual therapy immediately stopped and area affected cleaned thoroughly with alcohol wipes - PT confirmed pt has epipen for allergen exposures and has previously been educated on use in the event of need.  Proceeded to review IASTM techniques and fingertip STM to plantar scar without lubricant as pt requests.  She states no pain and no immediate irritation noted throughout intervention.  Pt denys symptom progression during session.  Discussion of various mobilization techniques to use prior to ankle stretching HEP to maximize tissue mobility. -Pt goes through AFO options and shows PT cutout that needs to be modified by [employer] to accommodate scar as pt reports scar tissue has progressed.  She also has an AFO that connects to the top of her shoe and she uses this one for HEP to help control the RLE (valgus collapse), she requests to use this for STS. -PT briefly reviews notebook of prior PT and pelvic PT exercises, pt has very comprehensive list and discussed trying to build upon this for LE strengthening and foot/ankle mobility. -STS x10 no UE support, edu to use pillow and LE brace for comfort with exercise as needed. -Heel raises in sitting x20 (significant limitation and big toe compensation on RLE - discussed benefit vs overuse in compensatory motion with patient) -Standing march w/ RUE support x20, cued for eccentric lower and increased right hip flexion  PATIENT EDUCATION:  Education details: Bring coconut oil from home for next session scar mobilization/STM for safety due to allergy reported 10/15. Person educated: Patient  Education method: Explanation Education comprehension: verbalized understanding  HOME EXERCISE PROGRAM:   If using tool try  zigzag, vertical/diagonal rubbing, anchor distal point and pull opposite edge of scar away from anchor.  Use deep pressure, circles, and light twisting of fingertip over scar.  Use mirror feedback to assess response to mobilization and for input to brain regarding sensation vs visualization.  Access Code: [email or web address] URL: [email or web address] Date: 01/27/2023 Prepared by: Camille Bal  Exercises - Sit to Stand  - 1 x daily - 5 x weekly - 2 sets - 10 reps - Seated Heel Raise  - 1 x daily - 5 x weekly - 3 sets - 10 reps - Standing March with Counter Support  - 1 x daily - 5 x weekly - 1 sets - 20 reps  ASSESSMENT:  CLINICAL IMPRESSION: Focus of skilled session today on initially addressing scar tissue on plantar aspect of right foot.  Several techniques reviewed to improve general scar mobility and effectiveness of right ankle and foot stretches.  Added to patient's long established HEP to focus moreso on LE strengthening of the plantarflexors, hip flexors and gluts.  She continues to benefit from skilled PT to further address strength and balance in setting of decreased RLE mobility and sensation changes.  OBJECTIVE IMPAIRMENTS: decreased activity tolerance, decreased balance, decreased coordination, difficulty walking, decreased ROM, and decreased strength.   ACTIVITY LIMITATIONS: standing, squatting, stairs, transfers, and locomotion level  PARTICIPATION LIMITATIONS: community activity and occupation  PERSONAL FACTORS: Fitness, Past/current experiences, and 1-2 comorbidities: myelomeningocele s/p surgical excision and right foot melanoma s/p excision  are also affecting patient's functional outcome.   REHAB POTENTIAL: Excellent  CLINICAL DECISION MAKING: Stable/uncomplicated  EVALUATION COMPLEXITY: Low   GOALS: Goals reviewed with patient? Yes  SHORT TERM GOALS: Target date: 02/13/2023 Pt will be independent and compliant with initial LE strength and balance HEP  in order to maintain functional progress and improve mobility. Baseline:  To be established. Goal status: INITIAL  2.  Pt will demonstrate adequate scar mobilization technique for right foot pain management. Baseline:  Handout to be provided. Goal status: INITIAL  3.  Pt will decrease 5xSTS to </=15.57 seconds w/o UE support in order to demonstrate decreased risk for falls and improved functional bilateral LE strength and power. Baseline: 18.57 sec w/ light BUE support Goal status: INITIAL  4.  Pt will complete condition 4 of mCTSIB with an average of 3 trials >/= 15 seconds in order to demonstrate improved vestibular input.   Baseline:  8.09 sec Goal status: INITIAL  LONG TERM GOALS: Target date: 03/13/2023  Pt will be independent and compliant with advanced LE strength and balance HEP in order to maintain functional progress and improve mobility. Baseline:  To be established. Goal status: INITIAL  2.  Pt will decrease 5xSTS to </=12.57 seconds w/o UE support in order to demonstrate decreased risk for falls and improved functional bilateral LE strength and power. Baseline: 18.57 sec w/ light BUE support Goal status: INITIAL  3.  Pt will complete condition 4 of mCTSIB with an average of 3 trials >/= 30 seconds in order to demonstrate improved vestibular input.  Baseline: 8.09 sec Goal status: INITIAL  PLAN:  PT FREQUENCY: 1x/week  PT DURATION: 8 weeks  PLANNED INTERVENTIONS: Therapeutic exercises, Therapeutic activity, Neuromuscular re-education, Balance training, Gait training, Patient/Family education, Self Care, Joint mobilization, Stair training, Vestibular training, Orthotic/Fit training, DME instructions, Dry Needling, Electrical stimulation, Moist heat, scar mobilization, Taping, Manual therapy, and Re-evaluation  PLAN FOR  NEXT SESSION: Add balance to HEP, static balance FT EC, scar mobilization-did she bring coconut oil, functional strengthening/ankle mobility and right  hip flexor strength, quadruped/tall kneeling?, blaze pods varied surfaces, banded STS  Sadie Haber, PT, DPT 01/27/2023, 3:34 PM

## 2023-01-27 NOTE — Patient Instructions (Addendum)
   If using tool try zigzag, vertical/diagonal rubbing, anchor distal point and pull opposite edge of scar away from anchor.  Use deep pressure, circles, and light twisting of fingertip over scar.  Use mirror feedback to assess response to mobilization and for input to brain regarding sensation vs visualization.  Access Code: [email or web address] URL: [email or web address] Date: 01/27/2023 Prepared by: Camille Bal  Exercises - Sit to Stand  - 1 x daily - 5 x weekly - 2 sets - 10 reps - Seated Heel Raise  - 1 x daily - 5 x weekly - 3 sets - 10 reps - Standing March with Counter Support  - 1 x daily - 5 x weekly - 1 sets - 20 reps

## 2023-01-28 ENCOUNTER — Encounter: Payer: Self-pay | Admitting: Dermatology

## 2023-01-29 ENCOUNTER — Telehealth: Payer: Self-pay

## 2023-01-29 LAB — SPOTTED FEVER GROUP ANTIBODIES
Spotted Fever Group IgG: <1:64 {titer}
Spotted Fever Group IgM: <1:64 {titer}

## 2023-01-29 LAB — SURGICAL PATHOLOGY

## 2023-01-29 LAB — LYME DISEASE SEROLOGY W/REFLEX: Lyme Total Antibody EIA: NEGATIVE

## 2023-01-29 NOTE — Telephone Encounter (Signed)
Called patient. N/A. VM not set up. Unable to leave message.

## 2023-01-29 NOTE — Telephone Encounter (Signed)
-----   Message from Armida Sans sent at 01/29/2023  5:55 PM EDT ----- FINAL DIAGNOSIS        1. Skin, right lateral sole anterior :       DYSPLASTIC COMPOUND NEVUS WITH MODERATE ATYPIA, CLOSE TO MARGIN        2. 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

## 2023-01-30 ENCOUNTER — Encounter: Payer: Self-pay | Admitting: Internal Medicine

## 2023-02-02 ENCOUNTER — Institutional Professional Consult (permissible substitution) (HOSPITAL_BASED_OUTPATIENT_CLINIC_OR_DEPARTMENT_OTHER): Payer: Medicaid Other | Admitting: Internal Medicine

## 2023-02-04 ENCOUNTER — Encounter: Payer: Self-pay | Admitting: Physical Therapy

## 2023-02-04 ENCOUNTER — Ambulatory Visit: Payer: Medicaid Other | Admitting: Physical Therapy

## 2023-02-04 DIAGNOSIS — M6281 Muscle weakness (generalized): Secondary | ICD-10-CM

## 2023-02-04 DIAGNOSIS — R293 Abnormal posture: Secondary | ICD-10-CM

## 2023-02-04 DIAGNOSIS — R2681 Unsteadiness on feet: Secondary | ICD-10-CM

## 2023-02-04 NOTE — Patient Instructions (Signed)
Access Code: [email or web address] URL: [email or web address] Date: 02/04/2023 Prepared by: Camille Bal  Exercises - Sit to Stand with Resistance Around Legs  - 1 x daily - 7 x weekly - 3 sets - 10 reps - Marching with Resistance  - 1 x daily - 4-5 x weekly - 2 sets - 20 reps - Corner Balance Feet Together With Eyes Closed  - 1 x daily - 4 x weekly - 1 sets - 2-3 reps - 30 seconds to 1 minute hold - Seated Toe Flexion Extension PROM  - 1 x daily - 5 x weekly - 2 sets - 10 reps - Seated Great Toe Extension  - 1 x daily - 5 x weekly - 2 sets - 10 reps - Seated Lesser Toes Extension  - 1 x daily - 5 x weekly - 2 sets - 10 reps - Seated Anterior Tibialis Stretch  - 1 x daily - 3-4 x weekly - 1 sets - 2 reps - 45 seconds to 1 minute hold

## 2023-02-04 NOTE — Therapy (Signed)
OUTPATIENT PHYSICAL THERAPY LOWER EXTREMITY TREATMENT   Patient Name: Melissa Pruitt MRN: [id number] DOB:May 21, 1975, 47 y.o., female Today's Date: 02/04/2023  END OF SESSION:   PT End of Session - 02/04/23 1452     Visit Number 3    Number of Visits 9   8 + eval   Date for PT Re-Evaluation 03/20/23   pushed out due to scheduling   Authorization Type [employer]    PT Start Time [phone]    PT Stop Time 1534    PT Time Calculation (min) 46 min    Equipment Utilized During Treatment Gait belt    Activity Tolerance Patient tolerated treatment well    Behavior During Therapy [facility] for tasks assessed/performed               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   R mid sole superior moderate atypia    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   R med sole inferior moderate atypia    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   R dorsum lat great toe   Dysplastic nevus 06/11/2020   left prox lat thigh sup, - severe   Dysplastic nevus 06/11/2020   left prox lat thigh inf, - moderate   Dysplastic nevus 06/11/2020   R mid to upper back 2.0cm lat to spine, Severe atypia   Dysplastic nevus 06/11/2020   R distal post thigh, moderate atypia   Dysplastic nevus 08/23/2020   Right labia - mild   Dysplastic nevus 08/12/2021   R lat neck post, moderate atypia   Dysplastic nevus 06/11/2012   L dorsal foot, mild atypia, bx from Dr. Larey Dresser   Dysplastic nevus 01/26/2023   Right lateral anterior sole. Moderate atypia, close to margin.   Dysplastic nevus 01/26/2023   Right lateral sole posterior. Moderate to severe atypia, close to margin.   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   Melanoma (HCC) 07/02/2020   Pain in joint of left shoulder 11/08/2019   Cervical radiculopathy 05/24/2019   Trigeminal neuralgia 11/05/2018   Symptomatic states associated with artificial menopause 11/05/2018   Arthralgia of hand, right 11/17/2017   Vitamin D deficiency 02/18/2017   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   Other specified disorders of uterus 10/12/2015   Herpes  simplex 06/27/2015   Hyperlipidemia 05/30/2015   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   Normocytic anemia 08/21/2014   Vitamin B 12 deficiency 08/21/2014   Allergic rhinitis 01/19/2014   Condyloma acuminatum 01/09/2014   Chronic pain 08/09/2013   Cervical dysplasia 05/09/2013   Other headache syndrome 05/09/2013   Arthralgia of temporomandibular joint 05/09/2013   Cervical pain 08/25/2012   Pain in joint, pelvic region and thigh 07/16/2012   Tethered spinal cord (HCC) 07/16/2012   Balance disorder 07/16/2012   Personal history of fall 07/16/2012   Neurogenic bladder 06/25/2011   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   OTHER SPECIFIED CONGENITAL ANOMALY SPINAL CORD 05/08/2010   Congenital anomaly of brain, spinal cord, 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   H N P-LUMBAR 04/23/2010    Backache 01/28/2010   Papanicolaou smear of cervix with atypical squamous cells cannot exclude high grade squamous intraepithelial lesion (ASC-H) 04/14/1898    PCP: [facility]  REFERRING PROVIDER: Janice Coffin, PA-C  REFERRING DIAG: [phone] (ICD-10-CM) - Lower extremity weakness  THERAPY DIAG:  Muscle weakness (generalized)  Unsteadiness on feet  Abnormal posture  Rationale for Evaluation and Treatment: Rehabilitation  ONSET DATE: November 2021 when initial foot cancer removed  SUBJECTIVE:   SUBJECTIVE STATEMENT: Pt brings her braces to session and her own coconut oil and IASTM tool today.  She states her muscle on the anterior shin on the RLE is irritated from recent increase in walking on top of regular exercise.  She has been doing scar mobilization.  The 2 spots removed prior to last session on lateral right foot appear clean and well healing, pt wears protective bandage over these.  She denies recent falls or other changes.  PERTINENT HISTORY: Myelomeningocele with surgical repair (w/o detethering),  self-catheterizes for neurogenic bladder, Cervical cancer, HPV, recurrent UTIs, HSV , chronic rt hip pain, right foot melanoma s/p excision, trigeminal neuralgia, left mild chronic C7 radiculopathy  PAIN:  Are you having pain? Yes: NPRS scale: 6-7/10 Pain location: low back to right buttcheek and foot Pain description: radiating into right leg Aggravating factors: standing still, sitting for long periods of time Relieving factors: laying down, ice and heat  PRECAUTIONS: Fall  RED FLAGS: Bowel or bladder incontinence: Yes: self-caths and self-braces for bowel movements    WEIGHT BEARING RESTRICTIONS: No  FALLS:  Has patient fallen in last 6 months? No - did have a significant fall down steps in Spring 2022  LIVING ENVIRONMENT: Lives with: lives with their daughter Lives in: House/apartment Stairs: Yes: External: 3 steps; none Has following  equipment at home: Single point cane, Shower bench, and Grab bars  OCCUPATION: disabled  PLOF: Independent  PATIENT GOALS: "To minimize some of the new pain"  NEXT MD VISIT: Deirdre Evener, MD 01/26/2023 (MD that manages excision site)  OBJECTIVE:  Note: Objective measures were completed at Evaluation unless otherwise noted.  DIAGNOSTIC FINDINGS: No recent relevant imaging.  PATIENT SURVEYS:  LEFS 47/80 = moderate level of difficulty  COGNITION: Overall cognitive status: Within functional limits for tasks assessed     SENSATION: Light touch: WFL  EDEMA:  None noted in BLE  POSTURE: No Significant postural limitations  PALPATION: She has radiating nerve type pain when pressing on excision upward into shin  LOWER EXTREMITY ROM:  Active ROM Right eval Left eval  Hip flexion    Hip extension    Hip abduction    Hip adduction    Hip internal rotation    Hip external rotation  Knee flexion    Knee extension    Ankle dorsiflexion Limited in DF and toe extension; hammer toe more pronounced on digits 4-5   Ankle plantarflexion    Ankle inversion    Ankle eversion     (Blank rows = not tested)  LOWER EXTREMITY MMT:  MMT Right eval Left eval  Hip flexion 3+/5 4+/5  Hip extension    Hip abduction 3+/5 4/5  Hip adduction    Hip internal rotation    Hip external rotation    Knee flexion    Knee extension 4-/5 5/5  Ankle dorsiflexion 2/5 4+/5  Ankle plantarflexion    Ankle inversion    Ankle eversion     (Blank rows = not tested)  LOWER EXTREMITY SPECIAL TESTS:  Ankle special tests: None appropriate for AROM on RLE and chief complaint  FUNCTIONAL TESTS:  5 times sit to stand: 18.57 sec w/ light BUE support MCTSIB: Condition 1: Avg of 3 trials: 30 sec, Condition 2: Avg of 3 trials: 30 sec, Condition 3: Avg of 3 trials: 30 sec, Condition 4: Avg of 3 trials: 8.09 sec, and Total Score: 98.09/120  GAIT: Distance walked: Various clinic  distances Assistive device utilized: None Level of assistance: Complete Independence Comments: No notable deviations or LOB on entering/exiting gym.   TODAY'S TREATMENT:                                                                                                                              DATE: 02/04/2023 -IASTM and scar mobilization using patient's IASTM tool and oil preference.  She denies reaction to deep prep exposure last session and reports she washed her foot when she got home and everything was fine.   -Green theraband STS x15, 1 instance of forward sway w/ CGA to recover -Green banded march in standing x30 w/ BUE support, tried w/o but pt hesitant -FT EO > FT EC x1 minute each -Active big toe flexion extension, some second and third toe active mobility, increased time and limited ROM -Seated figure-4 anterior tibialis stretch > PROM toe extension/flexion over several minutes, pt reports good stretch relief in anterior shin  PATIENT EDUCATION:  Education details: Bring coconut oil from home for next session scar mobilization/STM for safety due to allergy reported 10/15. Person educated: Patient Education method: Explanation Education comprehension: verbalized understanding  HOME EXERCISE PROGRAM:   If using tool try zigzag, vertical/diagonal rubbing, anchor distal point and pull opposite edge of scar away from anchor.  Use deep pressure, circles, and light twisting of fingertip over scar.  Use mirror feedback to assess response to mobilization and for input to brain regarding sensation vs visualization.  Access Code: [email or web address] URL: [email or web address] Date: 02/04/2023 Prepared by: Camille Bal  Exercises - Sit to Stand with Resistance Around Legs  - 1 x daily - 7 x weekly - 3 sets - 10 reps - Marching with Resistance  - 1 x daily - 4-5 x  weekly - 2 sets - 20 reps - Corner Balance Feet Together With Eyes Closed  - 1 x daily - 4 x weekly - 1 sets - 2-3  reps - 30 seconds to 1 minute hold - Seated Toe Flexion Extension PROM  - 1 x daily - 5 x weekly - 2 sets - 10 reps - Seated Great Toe Extension  - 1 x daily - 5 x weekly - 2 sets - 10 reps - Seated Lesser Toes Extension  - 1 x daily - 5 x weekly - 2 sets - 10 reps - Seated Anterior Tibialis Stretch  - 1 x daily - 3-4 x weekly - 1 sets - 2 reps - 45 seconds to 1 minute hold  ASSESSMENT:  CLINICAL IMPRESSION: Ongoing scar mobilization and soft tissue work to bottom of right foot.  Further revised HEP to challenge current pt functional level and improve comfort and mobility of right foot and ankle.  Will continue per POC as pt is responding to interventions well thus far.   OBJECTIVE IMPAIRMENTS: decreased activity tolerance, decreased balance, decreased coordination, difficulty walking, decreased ROM, and decreased strength.   ACTIVITY LIMITATIONS: standing, squatting, stairs, transfers, and locomotion level  PARTICIPATION LIMITATIONS: community activity and occupation  PERSONAL FACTORS: Fitness, Past/current experiences, and 1-2 comorbidities: myelomeningocele s/p surgical excision and right foot melanoma s/p excision  are also affecting patient's functional outcome.   REHAB POTENTIAL: Excellent  CLINICAL DECISION MAKING: Stable/uncomplicated  EVALUATION COMPLEXITY: Low   GOALS: Goals reviewed with patient? Yes  SHORT TERM GOALS: Target date: 02/13/2023 Pt will be independent and compliant with initial LE strength and balance HEP in order to maintain functional progress and improve mobility. Baseline:  To be established. Goal status: INITIAL  2.  Pt will demonstrate adequate scar mobilization technique for right foot pain management. Baseline:  Handout to be provided. Goal status: INITIAL  3.  Pt will decrease 5xSTS to </=15.57 seconds w/o UE support in order to demonstrate decreased risk for falls and improved functional bilateral LE strength and power. Baseline: 18.57 sec w/  light BUE support Goal status: INITIAL  4.  Pt will complete condition 4 of mCTSIB with an average of 3 trials >/= 15 seconds in order to demonstrate improved vestibular input.   Baseline:  8.09 sec Goal status: INITIAL  LONG TERM GOALS: Target date: 03/13/2023  Pt will be independent and compliant with advanced LE strength and balance HEP in order to maintain functional progress and improve mobility. Baseline:  To be established. Goal status: INITIAL  2.  Pt will decrease 5xSTS to </=12.57 seconds w/o UE support in order to demonstrate decreased risk for falls and improved functional bilateral LE strength and power. Baseline: 18.57 sec w/ light BUE support Goal status: INITIAL  3.  Pt will complete condition 4 of mCTSIB with an average of 3 trials >/= 30 seconds in order to demonstrate improved vestibular input.  Baseline: 8.09 sec Goal status: INITIAL  PLAN:  PT FREQUENCY: 1x/week  PT DURATION: 8 weeks  PLANNED INTERVENTIONS: Therapeutic exercises, Therapeutic activity, Neuromuscular re-education, Balance training, Gait training, Patient/Family education, Self Care, Joint mobilization, Stair training, Vestibular training, Orthotic/Fit training, DME instructions, Dry Needling, Electrical stimulation, Moist heat, scar mobilization, Taping, Manual therapy, and Re-evaluation  PLAN FOR NEXT SESSION: Add balance to HEP, static balance - EC, scar mobilization-did she bring coconut oil, functional strengthening/ankle mobility and right hip flexor strength - modified mountain climbers and deadbugs, quadruped/tall kneeling?, blaze pods varied surfaces  Michalla Ringer B  Midge Minium, PT, DPT 02/04/2023, 3:38 PM

## 2023-02-05 ENCOUNTER — Telehealth: Payer: Self-pay

## 2023-02-05 NOTE — Telephone Encounter (Signed)
-----   Message from Armida Sans sent at 01/29/2023  5:55 PM EDT ----- FINAL DIAGNOSIS        1. Skin, right lateral sole anterior :       DYSPLASTIC COMPOUND NEVUS WITH MODERATE ATYPIA, CLOSE TO MARGIN        2. 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

## 2023-02-05 NOTE — Telephone Encounter (Signed)
Advised pt of bx results and to keep f/u appt./sh ?

## 2023-02-06 ENCOUNTER — Ambulatory Visit: Payer: Medicaid Other

## 2023-02-06 ENCOUNTER — Other Ambulatory Visit
Admission: RE | Admit: 2023-02-06 | Discharge: 2023-02-06 | Disposition: A | Discharge status: Home / Self Care | Payer: Medicaid Other | Attending: Urology | Admitting: Urology

## 2023-02-06 ENCOUNTER — Encounter: Payer: Self-pay | Admitting: Urology

## 2023-02-06 ENCOUNTER — Other Ambulatory Visit: Payer: Self-pay

## 2023-02-06 ENCOUNTER — Ambulatory Visit (INDEPENDENT_AMBULATORY_CARE_PROVIDER_SITE_OTHER): Payer: Medicaid Other | Admitting: Urology

## 2023-02-06 VITALS — BP 110/67 | HR 69 | Ht 64.0 in | Wt 119.4 lb

## 2023-02-06 DIAGNOSIS — Q625 Duplication of ureter: Secondary | ICD-10-CM

## 2023-02-06 DIAGNOSIS — N390 Urinary tract infection, site not specified: Secondary | ICD-10-CM | POA: Diagnosis present

## 2023-02-06 DIAGNOSIS — M549 Dorsalgia, unspecified: Secondary | ICD-10-CM | POA: Diagnosis not present

## 2023-02-06 DIAGNOSIS — Z87440 Personal history of urinary (tract) infections: Secondary | ICD-10-CM | POA: Diagnosis not present

## 2023-02-06 DIAGNOSIS — R110 Nausea: Secondary | ICD-10-CM

## 2023-02-06 DIAGNOSIS — R300 Dysuria: Secondary | ICD-10-CM | POA: Diagnosis not present

## 2023-02-06 DIAGNOSIS — N138 Other obstructive and reflux uropathy: Secondary | ICD-10-CM

## 2023-02-06 LAB — URINALYSIS, COMPLETE (UACMP) WITH MICROSCOPIC
Bilirubin Urine: NEGATIVE
Glucose, UA: NEGATIVE mg/dL
Hgb urine dipstick: NEGATIVE
Ketones, ur: NEGATIVE mg/dL
Nitrite: NEGATIVE
Protein, ur: NEGATIVE mg/dL
Specific Gravity, Urine: 1.015 (ref 1.005–1.030)
WBC, UA: >50 WBC/hpf (ref 0–5)
pH: 5.5 (ref 5.0–8.0)

## 2023-02-06 LAB — BLADDER SCAN AMB NON-IMAGING

## 2023-02-06 NOTE — Progress Notes (Signed)
Melissa Pruitt,acting as a scribe for Melissa Scotland, MD.,have documented all relevant documentation on the behalf of Melissa Scotland, MD,as directed by  Melissa Scotland, MD while in the presence of Melissa Scotland, MD.  02/06/2023 9:57 AM   Melissa Pruitt [birth year] [id number]  Referring provider: Orson Eva, NP [address] [place],  [place] [address]  Chief Complaint  Patient presents with   Recurrent UTI    HPI: 47 year-old female with a personal history of recurrent urinary tract infections in setting of neurogenic bladder.   She has also already been seen by infectious disease on 12/10/2022 for this same issue. At that point, they had discussed the difference between recurrent UTI's versus bacterial colonization and reasons to treat IE clinical signs and symptoms of infection only. She has had multiple cross-sectional imaging including CT abdomen pelvis with contrast on 11/11/2022 that shows no GU pathology. She has been followed by Melissa Pruitt, urogynecologist.   She had a cystoscopy for her recurrent UTI's. It showed incidental mild erythema of the right bladder wall but no tumors. 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 Sclerosis metaplasia of the trigone was noted. There is extrinsic compression from a posterior uterine fibroid.   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  She also has a personal history of MDR E.coli, which complicates her history.   Her referral today is for recurrent UTI's with duplicated right collecting system. The note in the referral says it is not fully visualized on CT imaging. I personally reviewed the CT today which is detailed below.   She reports experiencing symptoms such as severe side pain radiating to the back, nausea, and burning sensation during urination. She describes the pain as feeling like  'fiery daggers' and notes that it worsens to the point of being unable to stand straight.  She is symptomatic today and has been for a few days.    She has three children and reports minimal alcohol intake. She drinks water and cranberry juice regularly and limits caffeine intake to one cup of coffee per day.  Her PVR today is 3 mL after cath.    She is able to tolerate levaquin despite being cipro.    Results for orders placed or performed in visit on 02/06/23  Bladder Scan (Post Void Residual) in office  Result Value Ref Range   Scan Result 3ml   Results for orders placed or performed during the hospital encounter of 02/06/23  Urinalysis, Complete w Microscopic -  Result Value Ref Range   Color, Urine YELLOW YELLOW   APPearance CLOUDY (A) CLEAR   Specific Gravity, Urine 1.015 1.005 - 1.030   pH 5.5 5.0 - 8.0   Glucose, UA NEGATIVE NEGATIVE mg/dL   Hgb urine dipstick NEGATIVE NEGATIVE   Bilirubin Urine NEGATIVE NEGATIVE   Ketones, ur NEGATIVE NEGATIVE mg/dL   Protein, ur NEGATIVE NEGATIVE mg/dL   Nitrite NEGATIVE NEGATIVE   Leukocytes,Ua MODERATE (A) NEGATIVE   Squamous Epithelial / HPF 0-5 0 - 5 /HPF   WBC, UA >50 0 - 5 WBC/hpf   RBC / HPF 0-5 0 - 5 RBC/hpf   Bacteria, UA FEW (A) NONE SEEN   WBC Clumps PRESENT     PMH: Past Medical History:  Diagnosis Date   Abnormal Pap smear of cervix    Cervical cancer (HCC)    removed in  July 2009   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   R mid sole superior moderate atypia    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   R med sole inferior moderate atypia    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   R dorsum lat great toe   Dysplastic nevus 06/11/2020   left prox lat thigh sup, - severe   Dysplastic nevus 06/11/2020   left prox lat thigh inf, - moderate   Dysplastic nevus 06/11/2020   R mid to upper back 2.0cm lat to spine, Severe atypia   Dysplastic nevus 06/11/2020   R distal post thigh, moderate atypia   Dysplastic nevus 08/23/2020   Right labia - mild    Dysplastic nevus 08/12/2021   R lat neck post, moderate atypia   Dysplastic nevus 06/11/2012   L dorsal foot, mild atypia, bx from Dr. Larey Pruitt   Dysplastic nevus 01/26/2023   Right lateral anterior sole. Moderate atypia, close to margin.   Dysplastic nevus 01/26/2023   Right lateral sole posterior. Moderate to severe atypia, close to margin.   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    colostomy reversal     EXPLORATORY LAPAROTOMY     with colostomy placement   LEEP     removal of cervical cancer     TUBAL LIGATION      Home Medications:  Allergies as of 02/06/2023       Reactions   Ciprofloxacin Anaphylaxis   Heart and nerve issues   Nitrofurantoin Shortness Of Breath   Sulfa Antibiotics Anaphylaxis   Patient reporting sulfa drugs cause anaphylaxis, difficulty breathing and hives, as well as nausea vomiting   Azithromycin Nausea And Vomiting   Beef (bovine) Protein    Lactose    Other reaction(s): Other (See Comments) Digestive issues/pain   Prednisone Other (See Comments)   Rosuvastatin Other (See Comments)   Tramadol Hcl    REACTION: HEART PALPITATIONS /SWELLING /SOB   Vitamin D Analogs    Severe Diarhhea   Lidocaine Palpitations   Pork-derived Products Nausea And Vomiting        Medication List        Accurate as of February 06, 2023  9:57 AM. If you have any questions, ask your nurse or doctor.           clobetasol cream 0.05 % Commonly known as: TEMOVATE Apply twice daily to bites until resolved. Avoid applying to face, groin, and axilla.   conjugated estrogens 0.625 MG/GM vaginal cream Commonly known as: PREMARIN Place 1 Applicatorful vaginally 2 (two) times a week. Place 0.5g nightly for two weeks then twice a week after   EPINEPHrine 0.3 mg/0.3 mL Soaj injection Commonly known as: EPI-PEN Inject 0.3 mg as directed See admin instructions.   estradiol 1 MG tablet Commonly known as: ESTRACE Take 1 tablet (1 mg total) by mouth daily. Take for 2 weeks each month as needed  for breakthrough bleeding only   fluticasone 50 MCG/ACT nasal spray Commonly known as: FLONASE Place 2 sprays into the nose daily.   hydrocortisone 2.5 % cream Apply topically at bedtime. Apply to under left arm on Monday, Wednesday and Friday.   loratadine 10 MG tablet Commonly known as: CLARITIN Take 10 mg by mouth daily.   methocarbamol 750 MG tablet Commonly known as: ROBAXIN Take 750 mg by mouth 3 (three) times daily.   mometasone 0.1 % ointment Commonly known as: ELOCON 1 application Externally sparingly twice a day for 30 days   norethindrone 0.35 MG tablet Commonly known as: MICRONOR Take 1 tablet (0.35 mg total) by mouth daily.   promethazine 12.5 MG tablet Commonly known as: PHENERGAN Take 1 tablet (12.5 mg total) by mouth every 8 (eight) hours as needed for nausea or vomiting.   Vitamin B-12 1000 MCG Subl Place under the tongue as needed.        Allergies:  Allergies  Allergen Reactions   Ciprofloxacin Anaphylaxis    Heart and nerve issues   Nitrofurantoin Shortness Of Breath   Sulfa Antibiotics Anaphylaxis    Patient reporting sulfa drugs cause anaphylaxis, difficulty breathing and hives, as well as nausea vomiting   Azithromycin Nausea And Vomiting   Beef (Bovine) Protein    Lactose     Other reaction(s): Other (See Comments) Digestive issues/pain   Prednisone Other (See  Comments)   Rosuvastatin Other (See Comments)   Tramadol Hcl     REACTION: HEART PALPITATIONS /SWELLING /SOB   Vitamin D Analogs     Severe Diarhhea   Lidocaine Palpitations   Pork-Derived Products Nausea And Vomiting    Family History: Family History  Problem Relation Age of Onset   Hypertension Mother    Atrial fibrillation Mother    Cervical cancer Maternal Grandmother    Colon cancer Neg Hx    Breast cancer Neg Hx     Social History:  reports that she quit smoking about 11 years ago. Her smoking use included cigarettes. She has never used smokeless tobacco. She reports current alcohol use. She reports that she does not use drugs.   Physical Exam: BP 110/67 (BP Location: Left Arm, Patient Position: Sitting, Cuff Size: Normal)   Pulse 69   Ht 5\' 4"  (1.626 m)   Wt 119 lb 6.4 oz (54.2 kg)   BMI 20.49 kg/m   Constitutional:  Alert and oriented, No acute distress. HEENT: [place] AT, moist mucus membranes.  Trachea midline, no masses. Neurologic: Grossly intact, no focal deficits, moving all 4 extremities. Psychiatric: Normal mood and affect.   Pertinent Imaging: EXAM: CT ABDOMEN AND PELVIS WITH CONTRAST   TECHNIQUE: Multidetector CT imaging of the abdomen and pelvis was performed using the standard protocol following bolus administration of intravenous contrast.   RADIATION DOSE REDUCTION: This exam was performed according to the departmental dose-optimization program which includes automated exposure control, adjustment of the mA and/or kV according to patient size and/or use of iterative reconstruction technique.   CONTRAST:  OMNIPAQUE IOHEXOL 300 MG/ML  SOLN   COMPARISON:  CT 07/25/2022   FINDINGS: Lower chest: Lung bases are clear and normal.   Hepatobiliary: Liver parenchyma is normal. No focal lesion. No calcified gallstones.   Pancreas: Normal   Spleen: Normal   Adrenals/Urinary Tract: Adrenal glands are normal. Kidneys are normal. No mass, stone  or hydronephrosis. No bladder abnormality by CT.   Stomach/Bowel: Stomach and small intestine are normal. Mild diverticulosis of the sigmoid  colon but without evidence of diverticulitis.   Vascular/Lymphatic: Aortic atherosclerosis. No aneurysm. IVC is normal. No adenopathy.   Reproductive: Multiple leiomyomas of the uterus as seen previously. Uterus in total measures 9.2 x 7.1 x 5.5 cm. No adnexal lesion is seen.   Other: No free fluid or air.   Musculoskeletal: No significant lumbar degenerative changes. Hip joints appear normal.   IMPRESSION: 1. No acute finding to explain the clinical presentation. 2. Aortic atherosclerosis. 3. Multiple leiomyomas of the uterus as seen previously. Uterus in total measures 9.2 x 7.1 x 5.5 cm. 4. Mild diverticulosis of the sigmoid colon but without evidence of diverticulitis.   Aortic Atherosclerosis (ICD10-I70.0).     Electronically Signed   By: Paulina Fusi M.D.   On: 11/11/2022 09:12  These images were personally reviewed. 2 separate renal pelvises with the upper-lower right upper pole moiety and a ureterocele visualized in the right presumed more medial upper pole moiety. There is no cortical scarring appreciated. There is no hydronephrosis or evidence of obstruction.    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 Recurrent UTI - Her infections are likely colonization as outlined by infectious disease - Agree with only treating for symptomatic infections as outlined previously - Would not recommend any further intervention - Consider repeat urodynamics if her symptoms change as outlined by Dr. Florian Buff, as well as occasional upper  tract imaging every couple of years via renal ultrasound, again, also part of Dr. Jari Favre plan - There is not an indication for any other intervention at this time - Urine culture today - Can tolerate Levaquin if needed - Use topical estrogen to maintain urethral integrity.  - Encourage use of D-mannose, cranberry tablets, and probiotics for bladder health.  3. Acute cystitis -UA + today -symptomatic -Will treated based on culture, likely Monday   Return based on symptomatology and culture results.Will share with Dr. Florian Buff for ongoing management and any changes in symptoms   I have reviewed the above documentation for accuracy and completeness, and I agree with the above.   Melissa Scotland, MD   [facility] [address] [place], [place] [address] [phone]  I spent 55 total minutes on the day of the encounter including pre-visit review of the medical record, face-to-face time with the patient, and post visit ordering of labs/imaging/tests.

## 2023-02-07 LAB — URINE CULTURE: Culture: NO GROWTH

## 2023-02-09 ENCOUNTER — Encounter: Payer: Self-pay | Admitting: Physical Therapy

## 2023-02-09 ENCOUNTER — Ambulatory Visit: Payer: Medicaid Other | Admitting: Physical Therapy

## 2023-02-09 DIAGNOSIS — M6281 Muscle weakness (generalized): Secondary | ICD-10-CM | POA: Diagnosis not present

## 2023-02-09 DIAGNOSIS — R293 Abnormal posture: Secondary | ICD-10-CM

## 2023-02-09 DIAGNOSIS — R2681 Unsteadiness on feet: Secondary | ICD-10-CM

## 2023-02-09 NOTE — Patient Instructions (Signed)
-   Forward Backward Monster Walk with Band at [employer]  - 1 x daily - 4 x weekly - 3 sets - 10 reps - [employer] on Counter  - 1 x daily - 4 x weekly - 2 sets - 20 reps

## 2023-02-09 NOTE — Therapy (Signed)
OUTPATIENT PHYSICAL THERAPY LOWER EXTREMITY TREATMENT   Patient Name: Melissa Pruitt MRN: [id number] DOB:02-25-1976, 47 y.o., female Today's Date: 02/09/2023  END OF SESSION:   PT End of Session - 02/09/23 1457     Visit Number 4    Number of Visits 9   8 + eval   Date for PT Re-Evaluation 03/20/23   pushed out due to scheduling   Authorization Type [employer]    PT Start Time [phone]    PT Stop Time 1532    PT Time Calculation (min) 44 min    Equipment Utilized During Treatment Gait belt    Activity Tolerance Patient tolerated treatment well    Behavior During Therapy [facility] for tasks assessed/performed               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   R mid sole superior moderate atypia    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   R med sole inferior moderate atypia    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   R dorsum lat great toe   Dysplastic nevus 06/11/2020   left prox lat thigh sup, - severe   Dysplastic nevus 06/11/2020   left prox lat thigh inf, - moderate   Dysplastic nevus 06/11/2020   R mid to upper back 2.0cm lat to spine, Severe atypia   Dysplastic nevus 06/11/2020   R distal post thigh, moderate atypia   Dysplastic nevus 08/23/2020   Right labia - mild   Dysplastic nevus 08/12/2021   R lat neck post, moderate atypia   Dysplastic nevus 06/11/2012   L dorsal foot, mild atypia, bx from Dr. Larey Dresser   Dysplastic nevus 01/26/2023   Right lateral anterior sole. Moderate atypia, close to margin.   Dysplastic nevus 01/26/2023   Right lateral sole posterior. Moderate to severe atypia, close to margin.   History of recurrent UTIs    HPV (human papilloma virus) infection    HSV (herpes simplex virus) infection    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[facility])    sacro/coccygeal mass excision   Melanoma (HCC) 02/16/2020   R mid plantar sole, BRESLOW'S  DEPTH/MAXIMUM TUMOR THICKNESS: 0.5 MM, CLARK/ANATOMIC LEVEL: II Excised 03/20/2020   Normocytic anemia 08/21/2014   Tethered spinal cord (HCC)    Vitamin B 12 deficiency 08/21/2014   Past Surgical History:  Procedure Laterality Date   BACK SURGERY     BLADDER SURGERY     COLOSTOMY     after flex sig as 47 year old.    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   Melanoma (HCC) 07/02/2020   Pain in joint of left shoulder 11/08/2019   Cervical radiculopathy 05/24/2019   Trigeminal neuralgia 11/05/2018   Symptomatic states associated with artificial menopause 11/05/2018   Arthralgia of hand, right 11/17/2017   Vitamin D deficiency 02/18/2017   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   Other specified disorders of uterus 10/12/2015   Herpes  simplex 06/27/2015   Hyperlipidemia 05/30/2015   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   Normocytic anemia 08/21/2014   Vitamin B 12 deficiency 08/21/2014   Allergic rhinitis 01/19/2014   Condyloma acuminatum 01/09/2014   Chronic pain 08/09/2013   Cervical dysplasia 05/09/2013   Other headache syndrome 05/09/2013   Arthralgia of temporomandibular joint 05/09/2013   Cervical pain 08/25/2012   Pain in joint, pelvic region and thigh 07/16/2012   Tethered spinal cord (HCC) 07/16/2012   Balance disorder 07/16/2012   Personal history of fall 07/16/2012   Neurogenic bladder 06/25/2011   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   OTHER SPECIFIED CONGENITAL ANOMALY SPINAL CORD 05/08/2010   Congenital anomaly of brain, spinal cord, 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   H N P-LUMBAR 04/23/2010    Backache 01/28/2010   Papanicolaou smear of cervix with atypical squamous cells cannot exclude high grade squamous intraepithelial lesion (ASC-H) 04/14/1898    PCP: [facility]  REFERRING PROVIDER: Janice Coffin, PA-C  REFERRING DIAG: [phone] (ICD-10-CM) - Lower extremity weakness  THERAPY DIAG:  Muscle weakness (generalized)  Unsteadiness on feet  Abnormal posture  Rationale for Evaluation and Treatment: Rehabilitation  ONSET DATE: November 2021 when initial foot cancer removed  SUBJECTIVE:   SUBJECTIVE STATEMENT: Pt went to a concert last night and was standing on her tip toes for prolonged period and as a result is in severe pain today.  She denies recent falls or other changes.  PERTINENT HISTORY: Myelomeningocele with surgical repair (w/o detethering),  self-catheterizes for neurogenic bladder, Cervical cancer, HPV, recurrent UTIs, HSV , chronic rt hip pain, right foot melanoma s/p excision, trigeminal neuralgia, left mild chronic C7 radiculopathy  PAIN:  Are you having pain? Yes: NPRS scale: 8/10 Pain location: right foot Pain description: radiating into right leg Aggravating factors: standing still, sitting for long periods of time, PF Relieving factors: laying down, ice and heat  PRECAUTIONS: Fall  RED FLAGS: Bowel or bladder incontinence: Yes: self-caths and self-braces for bowel movements    WEIGHT BEARING RESTRICTIONS: No  FALLS:  Has patient fallen in last 6 months? No - did have a significant fall down steps in Spring 2022  LIVING ENVIRONMENT: Lives with: lives with their daughter Lives in: House/apartment Stairs: Yes: External: 3 steps; none Has following equipment at home: Single point cane, Shower bench, and Grab bars  OCCUPATION: disabled  PLOF: Independent  PATIENT GOALS: "To minimize some of the new pain"  NEXT MD VISIT: Deirdre Evener, MD 01/26/2023 (MD that manages excision site)  OBJECTIVE:  Note: Objective  measures were completed at Evaluation unless otherwise noted.  DIAGNOSTIC FINDINGS: No recent relevant imaging.  PATIENT SURVEYS:  LEFS 47/80 = moderate level of difficulty  COGNITION: Overall cognitive status: Within functional limits for tasks assessed     SENSATION: Light touch: WFL  EDEMA:  None noted in BLE  POSTURE: No Significant postural limitations  PALPATION: She has radiating nerve type pain when pressing on excision upward into shin  LOWER EXTREMITY ROM:  Active ROM Right eval Left eval  Hip flexion    Hip extension    Hip abduction    Hip adduction    Hip internal rotation    Hip external rotation    Knee flexion    Knee extension    Ankle dorsiflexion Limited in DF and toe extension; hammer toe more pronounced on digits 4-5   Ankle plantarflexion    Ankle inversion    Ankle eversion     (Blank rows =  not tested)  LOWER EXTREMITY MMT:  MMT Right eval Left eval  Hip flexion 3+/5 4+/5  Hip extension    Hip abduction 3+/5 4/5  Hip adduction    Hip internal rotation    Hip external rotation    Knee flexion    Knee extension 4-/5 5/5  Ankle dorsiflexion 2/5 4+/5  Ankle plantarflexion    Ankle inversion    Ankle eversion     (Blank rows = not tested)  LOWER EXTREMITY SPECIAL TESTS:  Ankle special tests: None appropriate for AROM on RLE and chief complaint  FUNCTIONAL TESTS:  5 times sit to stand: 18.57 sec w/ light BUE support MCTSIB: Condition 1: Avg of 3 trials: 30 sec, Condition 2: Avg of 3 trials: 30 sec, Condition 3: Avg of 3 trials: 30 sec, Condition 4: Avg of 3 trials: 8.09 sec, and Total Score: 98.09/120  GAIT: Distance walked: Various clinic distances Assistive device utilized: None Level of assistance: Complete Independence Comments: No notable deviations or LOB on entering/exiting gym.   TODAY'S TREATMENT:                                                                                                                               DATE: 02/09/2023 -IASTM and scar mobilization using patient's IASTM tool and coconut oil preference.  Used scraping, cross-friction, and pressure with rotation to break up scar tissue around and over scar.   -Forward and retro-tandem no UE support 4x10 ft each direction -Forward and backwards monster walks w/ red theraband at thighs 4x10 ft each direction no UE support SBA -Mountain climbers on countertop w/ PT cuing for plank posture vs bend at hip x20, more challenge to RLE  PATIENT EDUCATION:  Education details: Bring coconut oil from home for next session scar mobilization/STM for safety due to allergy reported 10/15.  Discussed ice massage to numb area around scar if pain/intense nerve pains persist.  Edu on press and turn technique for deep scar tissue breakup. Person educated: Patient Education method: Explanation Education comprehension: verbalized understanding  HOME EXERCISE PROGRAM:   If using tool try zigzag, vertical/diagonal rubbing, anchor distal point and pull opposite edge of scar away from anchor.  Use deep pressure, circles, and light twisting of fingertip over scar.  Use mirror feedback to assess response to mobilization and for input to brain regarding sensation vs visualization.  Access Code: [email or web address] URL: [email or web address] Date: 02/04/2023 Prepared by: Camille Bal  Exercises - Sit to Stand with Resistance Around Legs  - 1 x daily - 7 x weekly - 3 sets - 10 reps - Marching with Resistance  - 1 x daily - 4-5 x weekly - 2 sets - 20 reps - Corner Balance Feet Together With Eyes Closed  - 1 x daily - 4 x weekly - 1 sets - 2-3 reps - 30 seconds to 1 minute hold - Seated Toe Flexion Extension PROM  - 1 x daily - 5 x  weekly - 2 sets - 10 reps - Seated Great Toe Extension  - 1 x daily - 5 x weekly - 2 sets - 10 reps - Seated Lesser Toes Extension  - 1 x daily - 5 x weekly - 2 sets - 10 reps - Seated Anterior Tibialis Stretch  - 1 x daily - 3-4 x  weekly - 1 sets - 2 reps - 45 seconds to 1 minute hold - Forward Backward Monster Walk with Band at Thighs and Counter Support  - 1 x daily - 4 x weekly - 3 sets - 10 reps - [employer] on Counter  - 1 x daily - 4 x weekly - 2 sets - 20 reps  ASSESSMENT:  CLINICAL IMPRESSION: Ongoing scar mobilization and soft tissue work to bottom of right foot.  Further revised HEP to address hip flexor strength and coordination and dynamic stability.  She was most challenged by squatted mobility reporting high levels of challenge.  Will continue per POC as pt remains motivated and showing good response.  OBJECTIVE IMPAIRMENTS: decreased activity tolerance, decreased balance, decreased coordination, difficulty walking, decreased ROM, and decreased strength.   ACTIVITY LIMITATIONS: standing, squatting, stairs, transfers, and locomotion level  PARTICIPATION LIMITATIONS: community activity and occupation  PERSONAL FACTORS: Fitness, Past/current experiences, and 1-2 comorbidities: myelomeningocele s/p surgical excision and right foot melanoma s/p excision  are also affecting patient's functional outcome.   REHAB POTENTIAL: Excellent  CLINICAL DECISION MAKING: Stable/uncomplicated  EVALUATION COMPLEXITY: Low   GOALS: Goals reviewed with patient? Yes  SHORT TERM GOALS: Target date: 02/13/2023 Pt will be independent and compliant with initial LE strength and balance HEP in order to maintain functional progress and improve mobility. Baseline:  To be established. Goal status: INITIAL  2.  Pt will demonstrate adequate scar mobilization technique for right foot pain management. Baseline:  Handout to be provided. Goal status: INITIAL  3.  Pt will decrease 5xSTS to </=15.57 seconds w/o UE support in order to demonstrate decreased risk for falls and improved functional bilateral LE strength and power. Baseline: 18.57 sec w/ light BUE support Goal status: INITIAL  4.  Pt will complete condition 4 of  mCTSIB with an average of 3 trials >/= 15 seconds in order to demonstrate improved vestibular input.   Baseline:  8.09 sec Goal status: INITIAL  LONG TERM GOALS: Target date: 03/13/2023  Pt will be independent and compliant with advanced LE strength and balance HEP in order to maintain functional progress and improve mobility. Baseline:  To be established. Goal status: INITIAL  2.  Pt will decrease 5xSTS to </=12.57 seconds w/o UE support in order to demonstrate decreased risk for falls and improved functional bilateral LE strength and power. Baseline: 18.57 sec w/ light BUE support Goal status: INITIAL  3.  Pt will complete condition 4 of mCTSIB with an average of 3 trials >/= 30 seconds in order to demonstrate improved vestibular input.  Baseline: 8.09 sec Goal status: INITIAL  PLAN:  PT FREQUENCY: 1x/week  PT DURATION: 8 weeks  PLANNED INTERVENTIONS: Therapeutic exercises, Therapeutic activity, Neuromuscular re-education, Balance training, Gait training, Patient/Family education, Self Care, Joint mobilization, Stair training, Vestibular training, Orthotic/Fit training, DME instructions, Dry Needling, Electrical stimulation, Moist heat, scar mobilization, Taping, Manual therapy, and Re-evaluation  PLAN FOR NEXT SESSION:  ASSESS STGs! Add balance to HEP -high level balance - EC, scar mobilization-did she bring coconut oil?, functional strengthening/ankle mobility and right hip flexor strength - deadbugs, quadruped/tall kneeling?, blaze pods varied surfaces  Eugune Sine  Kathrin Penner, PT, DPT 02/09/2023, 3:35 PM

## 2023-02-12 NOTE — Telephone Encounter (Signed)
Can you put her on my schedule for a cathed urine sample for [facility]?

## 2023-02-13 ENCOUNTER — Ambulatory Visit: Payer: Medicaid Other | Admitting: Obstetrics and Gynecology

## 2023-02-13 ENCOUNTER — Encounter: Payer: Self-pay | Admitting: Obstetrics and Gynecology

## 2023-02-13 VITALS — BP 95/61 | HR 62

## 2023-02-13 DIAGNOSIS — R300 Dysuria: Secondary | ICD-10-CM | POA: Diagnosis not present

## 2023-02-13 DIAGNOSIS — N390 Urinary tract infection, site not specified: Secondary | ICD-10-CM

## 2023-02-13 DIAGNOSIS — Z87440 Personal history of urinary (tract) infections: Secondary | ICD-10-CM

## 2023-02-13 NOTE — Progress Notes (Signed)
[facility] Return Visit  SUBJECTIVE  History of Present Illness: Melissa Pruitt is a 47 y.o. female seen in follow-up for dysuria and recurrent UTI.     Past Medical History: Patient  has a past medical history of Abnormal Pap smear of cervix, Cervical cancer (HCC), Dysplastic nevus (02/16/2020), Dysplastic nevus (02/16/2020), Dysplastic nevus (02/16/2020), Dysplastic nevus (06/11/2020), Dysplastic nevus (06/11/2020), Dysplastic nevus (06/11/2020), Dysplastic nevus (06/11/2020), Dysplastic nevus (08/23/2020), Dysplastic nevus (08/12/2021), Dysplastic nevus (06/11/2012), Dysplastic nevus (01/26/2023), Dysplastic nevus (01/26/2023), History of recurrent UTIs, HPV (human papilloma virus) infection, HSV (herpes simplex virus) infection, 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[facility]), Melanoma (HCC) (02/16/2020), Normocytic anemia (08/21/2014), Tethered spinal cord (HCC), and Vitamin B 12 deficiency (08/21/2014).   Past Surgical History: She  has a past surgical history that includes Back surgery; Bladder surgery; Colostomy; Tubal ligation; removal of cervical cancer; colostomy reversal; Exploratory laparotomy; and LEEP.   Medications: She has a current medication list which includes the following prescription(s): clobetasol cream, conjugated estrogens, vitamin b-12, epinephrine, estradiol, fluticasone, hydrocortisone, loratadine, methocarbamol, mometasone, norethindrone, and promethazine, and the following Facility-Administered Medications: lidocaine.   Allergies: Patient is allergic to ciprofloxacin, nitrofurantoin, sulfa antibiotics, azithromycin, beef (bovine) protein, lactose, prednisone, rosuvastatin, tramadol hcl, vitamin d analogs, lidocaine, and pork-derived products.   Social History: Patient  reports that she quit smoking about 11 years ago. Her smoking use included cigarettes. She has never used smokeless tobacco. She reports current alcohol use. She reports that she does  not use drugs.      OBJECTIVE     Physical Exam: Vitals:   02/13/23 1013  BP: 95/61  Pulse: 62   Gen: No apparent distress, A&O x 3.  Detailed Urogynecologic Evaluation:  Deferred.    ASSESSMENT AND PLAN    Melissa Pruitt is a 47 y.o. with:  1. Dysuria    Patient has a history of recurrent UTI's as related to her self catheterization. She also reports her most recent sexual partner's condom came off during intercourse and she is concerned for STD. Will send [facility] urine test and added STI screening that is available.  Will send antibiotics as indicated.

## 2023-02-14 LAB — COLOGUARD: COLOGUARD: NEGATIVE

## 2023-02-15 ENCOUNTER — Encounter: Payer: Self-pay | Admitting: Obstetrics and Gynecology

## 2023-02-15 MED ORDER — AMOXICILLIN-POT CLAVULANATE 875-125 MG PO TABS: 1.0000 | ORAL_TABLET | Freq: Two times a day (BID) | ORAL | 0 refills | Status: DC

## 2023-02-15 NOTE — Addendum Note (Signed)
Addended by: Selmer Dominion on: 02/15/2023 08:44 PM   Modules accepted: Orders

## 2023-02-17 NOTE — Progress Notes (Signed)
Patient notified

## 2023-02-18 ENCOUNTER — Encounter: Payer: Self-pay | Admitting: Physical Therapy

## 2023-02-18 ENCOUNTER — Ambulatory Visit: Payer: Medicaid Other | Attending: Nurse Practitioner | Admitting: Physical Therapy

## 2023-02-18 DIAGNOSIS — R2681 Unsteadiness on feet: Secondary | ICD-10-CM | POA: Diagnosis present

## 2023-02-18 DIAGNOSIS — R279 Unspecified lack of coordination: Secondary | ICD-10-CM | POA: Insufficient documentation

## 2023-02-18 DIAGNOSIS — M6281 Muscle weakness (generalized): Secondary | ICD-10-CM | POA: Diagnosis present

## 2023-02-18 DIAGNOSIS — R293 Abnormal posture: Secondary | ICD-10-CM | POA: Diagnosis present

## 2023-02-18 NOTE — Therapy (Signed)
OUTPATIENT PHYSICAL THERAPY LOWER EXTREMITY TREATMENT   Patient Name: Melissa Pruitt MRN: [id number] DOB:[birth year], 47 y.o., female Today's Date: 02/18/2023  END OF SESSION:   PT End of Session - 02/18/23 1456     Visit Number 5    Number of Visits 9   8 + eval   Date for PT Re-Evaluation 03/20/23   pushed out due to scheduling   Authorization Type [employer]    PT Start Time 1450    PT Stop Time 1520    PT Time Calculation (min) 30 min    Equipment Utilized During Treatment Gait belt    Activity Tolerance Patient tolerated treatment well    Behavior During Therapy [facility] for tasks assessed/performed               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   R mid sole superior moderate atypia    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   R med sole inferior moderate atypia    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   R dorsum lat great toe   Dysplastic nevus 06/11/2020   left prox lat thigh sup, - severe   Dysplastic nevus 06/11/2020   left prox lat thigh inf, - moderate   Dysplastic nevus 06/11/2020   R mid to upper back 2.0cm lat to spine, Severe atypia   Dysplastic nevus 06/11/2020   R distal post thigh, moderate atypia   Dysplastic nevus 08/23/2020   Right labia - mild   Dysplastic nevus 08/12/2021   R lat neck post, moderate atypia   Dysplastic nevus 06/11/2012   L dorsal foot, mild atypia, bx from Dr. Larey Dresser   Dysplastic nevus 01/26/2023   Right lateral anterior sole. Moderate atypia, close to margin.   Dysplastic nevus 01/26/2023   Right lateral sole posterior. Moderate to severe atypia, close to margin.   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   Melanoma (HCC) 07/02/2020   Pain in joint of left shoulder 11/08/2019   Cervical radiculopathy 05/24/2019   Trigeminal neuralgia 11/05/2018   Symptomatic states associated with artificial menopause 11/05/2018   Arthralgia of hand, right 11/17/2017   Vitamin D deficiency 02/18/2017   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   Other specified disorders of uterus 10/12/2015   Herpes  simplex 06/27/2015   Hyperlipidemia 05/30/2015   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   Normocytic anemia 08/21/2014   Vitamin B 12 deficiency 08/21/2014   Allergic rhinitis 01/19/2014   Condyloma acuminatum 01/09/2014   Chronic pain 08/09/2013   Cervical dysplasia 05/09/2013   Other headache syndrome 05/09/2013   Arthralgia of temporomandibular joint 05/09/2013   Cervical pain 08/25/2012   Pain in joint, pelvic region and thigh 07/16/2012   Tethered spinal cord (HCC) 07/16/2012   Balance disorder 07/16/2012   Personal history of fall 07/16/2012   Neurogenic bladder 06/25/2011   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   OTHER SPECIFIED CONGENITAL ANOMALY SPINAL CORD 05/08/2010   Congenital anomaly of brain, spinal cord, 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   H N P-LUMBAR 04/23/2010    Backache 01/28/2010   Papanicolaou smear of cervix with atypical squamous cells cannot exclude high grade squamous intraepithelial lesion (ASC-H) 04/14/1898    PCP: [facility]  REFERRING PROVIDER: Janice Coffin, PA-C  REFERRING DIAG: [phone] (ICD-10-CM) - Lower extremity weakness  THERAPY DIAG:  Muscle weakness (generalized)  Unsteadiness on feet  Abnormal posture  Rationale for Evaluation and Treatment: Rehabilitation  ONSET DATE: November 2021 when initial foot cancer removed  SUBJECTIVE:   SUBJECTIVE STATEMENT: [place]level terrain for her and she is unsure if this is what set off her pain today, but woke up with severe right foot pain.  She denies recent falls or other acute issues.  She is being treated for a UTI currently.  PERTINENT HISTORY: Myelomeningocele with surgical repair (w/o detethering),  self-catheterizes for neurogenic bladder, Cervical cancer, HPV, recurrent UTIs, HSV , chronic rt hip pain, right foot melanoma s/p excision, trigeminal neuralgia, left mild chronic C7 radiculopathy  PAIN:  Are you having pain? Yes: NPRS scale: 9-10/10 Pain location: right foot Pain description: radiating into right leg Aggravating factors: standing still, sitting for long periods of time, PF Relieving factors: laying down, ice and heat  PRECAUTIONS: Fall  RED FLAGS: Bowel or bladder incontinence: Yes: self-caths and self-braces for bowel movements    WEIGHT BEARING RESTRICTIONS: No  FALLS:  Has patient fallen in last 6 months? No - did have a significant fall down steps in Spring 2022  LIVING ENVIRONMENT: Lives with: lives with their daughter Lives in: House/apartment Stairs: Yes: External: 3 steps; none Has following equipment at home: Single point cane, Shower bench, and Grab bars  OCCUPATION: disabled  PLOF: Independent  PATIENT GOALS: "To minimize some of the new  pain"  NEXT MD VISIT: Deirdre Evener, MD 01/26/2023 (MD that manages excision site)  OBJECTIVE:  Note: Objective measures were completed at Evaluation unless otherwise noted.  DIAGNOSTIC FINDINGS: No recent relevant imaging.  PATIENT SURVEYS:  LEFS 47/80 = moderate level of difficulty  COGNITION: Overall cognitive status: Within functional limits for tasks assessed     SENSATION: Light touch: WFL  EDEMA:  None noted in BLE  POSTURE: No Significant postural limitations  PALPATION: She has radiating nerve type pain when pressing on excision upward into shin  LOWER EXTREMITY ROM:  Active ROM Right eval Left eval  Hip flexion    Hip extension    Hip abduction    Hip adduction    Hip internal rotation    Hip external rotation    Knee flexion    Knee extension    Ankle dorsiflexion Limited in DF and toe extension; hammer toe more pronounced on digits 4-5  Ankle plantarflexion    Ankle inversion    Ankle eversion     (Blank rows = not tested)  LOWER EXTREMITY MMT:  MMT Right eval Left eval  Hip flexion 3+/5 4+/5  Hip extension    Hip abduction 3+/5 4/5  Hip adduction    Hip internal rotation    Hip external rotation    Knee flexion    Knee extension 4-/5 5/5  Ankle dorsiflexion 2/5 4+/5  Ankle plantarflexion    Ankle inversion    Ankle eversion     (Blank rows = not tested)  LOWER EXTREMITY SPECIAL TESTS:  Ankle special tests: None appropriate for AROM on RLE and chief complaint  FUNCTIONAL TESTS:  5 times sit to stand: 18.57 sec w/ light BUE support MCTSIB: Condition 1: Avg of 3 trials: 30 sec, Condition 2: Avg of 3 trials: 30 sec, Condition 3: Avg of 3 trials: 30 sec, Condition 4: Avg of 3 trials: 8.09 sec, and Total Score: 98.09/120  GAIT: Distance walked: Various clinic distances Assistive device utilized: None Level of assistance: Complete Independence Comments: No notable deviations or LOB on entering/exiting gym.   TODAY'S TREATMENT:                                                                                                                               DATE: 02/18/2023 -Time spent reviewing scar mobilization techniques - using IASTM and scar mobilization using patient's IASTM tool and coconut oil preference.  Used scraping, cross-friction, and star burst pattern to break up scar tissue around and over scar.  Pt having more medial insertion tenderness possibly related to plantar fasciitis pain so STM utilized w/ improved pain at end of session.    PATIENT EDUCATION:  Education details: Bring coconut oil from home for next session scar mobilization/STM for safety due to allergy reported 10/15.  Discussed ice/heat alternation and using stretches to manage pain.  Can use topicals that she is not allergic to in combination with STM techniques for better pain management at home.  Discussed using spike balls or foot roller to assist with manual techniques.  Continue HEP when pain better managed. Person educated: Patient Education method: Explanation Education comprehension: verbalized understanding  HOME EXERCISE PROGRAM:   If using tool try zigzag, vertical/diagonal rubbing, anchor distal point and pull opposite edge of scar away from anchor.  Use deep pressure, circles, and light twisting of fingertip over scar.  Use mirror feedback to assess response to mobilization and for input to brain regarding sensation vs visualization.  Access Code: [email or web address] URL: [email or web address] Date: 02/04/2023 Prepared by: Camille Bal  Exercises - Sit to Stand with Resistance Around Legs  - 1 x daily - 7 x weekly - 3 sets - 10 reps - Marching with Resistance  - 1 x daily - 4-5 x weekly - 2 sets - 20 reps - Corner Balance Feet Together With Eyes Closed  - 1 x daily - 4 x  weekly - 1 sets - 2-3 reps - 30 seconds to 1 minute hold - Seated Toe Flexion Extension PROM  - 1 x daily - 5 x weekly - 2 sets - 10 reps - Seated Great  Toe Extension  - 1 x daily - 5 x weekly - 2 sets - 10 reps - Seated Lesser Toes Extension  - 1 x daily - 5 x weekly - 2 sets - 10 reps - Seated Anterior Tibialis Stretch  - 1 x daily - 3-4 x weekly - 1 sets - 2 reps - 45 seconds to 1 minute hold - Forward Backward Monster Walk with Band at [employer] and Counter Support  - 1 x daily - 4 x weekly - 3 sets - 10 reps - [employer] on Counter  - 1 x daily - 4 x weekly - 2 sets - 20 reps  ASSESSMENT:  CLINICAL IMPRESSION: Session limited due to pain this visit with pt wanting to hold off on STG assessment in favor of STM for pain.  She did have a positive pain response to techniques used today and discussion was had regarding utilization at home.  She continues to benefit from scar management techniques to improve pain and function in weight bearing positions.  OBJECTIVE IMPAIRMENTS: decreased activity tolerance, decreased balance, decreased coordination, difficulty walking, decreased ROM, and decreased strength.   ACTIVITY LIMITATIONS: standing, squatting, stairs, transfers, and locomotion level  PARTICIPATION LIMITATIONS: community activity and occupation  PERSONAL FACTORS: Fitness, Past/current experiences, and 1-2 comorbidities: myelomeningocele s/p surgical excision and right foot melanoma s/p excision  are also affecting patient's functional outcome.   REHAB POTENTIAL: Excellent  CLINICAL DECISION MAKING: Stable/uncomplicated  EVALUATION COMPLEXITY: Low   GOALS: Goals reviewed with patient? Yes  SHORT TERM GOALS: Target date: 02/13/2023 Pt will be independent and compliant with initial LE strength and balance HEP in order to maintain functional progress and improve mobility. Baseline:  To be established. Goal status: INITIAL  2.  Pt will demonstrate adequate scar mobilization technique for right foot pain management. Baseline:  Handout to be provided. Goal status: INITIAL  3.  Pt will decrease 5xSTS to </=15.57 seconds w/o UE  support in order to demonstrate decreased risk for falls and improved functional bilateral LE strength and power. Baseline: 18.57 sec w/ light BUE support Goal status: INITIAL  4.  Pt will complete condition 4 of mCTSIB with an average of 3 trials >/= 15 seconds in order to demonstrate improved vestibular input.   Baseline:  8.09 sec Goal status: INITIAL  LONG TERM GOALS: Target date: 03/13/2023  Pt will be independent and compliant with advanced LE strength and balance HEP in order to maintain functional progress and improve mobility. Baseline:  To be established. Goal status: INITIAL  2.  Pt will decrease 5xSTS to </=12.57 seconds w/o UE support in order to demonstrate decreased risk for falls and improved functional bilateral LE strength and power. Baseline: 18.57 sec w/ light BUE support Goal status: INITIAL  3.  Pt will complete condition 4 of mCTSIB with an average of 3 trials >/= 30 seconds in order to demonstrate improved vestibular input.  Baseline: 8.09 sec Goal status: INITIAL  PLAN:  PT FREQUENCY: 1x/week  PT DURATION: 8 weeks  PLANNED INTERVENTIONS: Therapeutic exercises, Therapeutic activity, Neuromuscular re-education, Balance training, Gait training, Patient/Family education, Self Care, Joint mobilization, Stair training, Vestibular training, Orthotic/Fit training, DME instructions, Dry Needling, Electrical stimulation, Moist heat, scar mobilization, Taping, Manual therapy, and Re-evaluation  PLAN FOR NEXT SESSION:  ASSESS STGs! Add balance to HEP -high level balance - EC, scar mobilization-did she bring coconut oil?, functional strengthening/ankle mobility and right hip flexor strength - deadbugs, quadruped/tall kneeling?, blaze pods varied surfaces  Sadie Haber, PT, DPT 02/18/2023, 4:46 PM

## 2023-02-23 ENCOUNTER — Ambulatory Visit: Payer: Medicaid Other | Admitting: Physical Therapy

## 2023-02-23 ENCOUNTER — Encounter: Payer: Self-pay | Admitting: Physical Therapy

## 2023-02-23 DIAGNOSIS — M6281 Muscle weakness (generalized): Secondary | ICD-10-CM

## 2023-02-23 DIAGNOSIS — R293 Abnormal posture: Secondary | ICD-10-CM

## 2023-02-23 DIAGNOSIS — R2681 Unsteadiness on feet: Secondary | ICD-10-CM

## 2023-02-23 NOTE — Therapy (Signed)
OUTPATIENT PHYSICAL THERAPY LOWER EXTREMITY TREATMENT   Patient Name: Melissa Pruitt MRN: [id number] DOB:15-Jan-1976, 47 y.o., female Today's Date: 02/23/2023  END OF SESSION:   PT End of Session - 02/23/23 1455     Visit Number 6    Number of Visits 9   8 + eval   Date for PT Re-Evaluation 03/20/23   pushed out due to scheduling   Authorization Type [employer]    PT Start Time 1452    PT Stop Time 1537    PT Time Calculation (min) 45 min    Equipment Utilized During Treatment Gait belt    Activity Tolerance Patient tolerated treatment well    Behavior During Therapy [facility] for tasks assessed/performed               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   R mid sole superior moderate atypia    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   R med sole inferior moderate atypia    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   R dorsum lat great toe   Dysplastic nevus 06/11/2020   left prox lat thigh sup, - severe   Dysplastic nevus 06/11/2020   left prox lat thigh inf, - moderate   Dysplastic nevus 06/11/2020   R mid to upper back 2.0cm lat to spine, Severe atypia   Dysplastic nevus 06/11/2020   R distal post thigh, moderate atypia   Dysplastic nevus 08/23/2020   Right labia - mild   Dysplastic nevus 08/12/2021   R lat neck post, moderate atypia   Dysplastic nevus 06/11/2012   L dorsal foot, mild atypia, bx from Dr. Larey Dresser   Dysplastic nevus 01/26/2023   Right lateral anterior sole. Moderate atypia, close to margin.   Dysplastic nevus 01/26/2023   Right lateral sole posterior. Moderate to severe atypia, close to margin.   History of recurrent UTIs    HPV (human papilloma virus) infection    HSV (herpes simplex virus) infection    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[facility])    sacro/coccygeal mass excision   Melanoma (HCC) 02/16/2020   R mid plantar sole, BRESLOW'S  DEPTH/MAXIMUM TUMOR THICKNESS: 0.5 MM, CLARK/ANATOMIC LEVEL: II Excised 03/20/2020   Normocytic anemia 08/21/2014   Tethered spinal cord (HCC)    Vitamin B 12 deficiency 08/21/2014   Past Surgical History:  Procedure Laterality Date   BACK SURGERY     BLADDER SURGERY     COLOSTOMY     after flex sig as 47 year old.    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   Melanoma (HCC) 07/02/2020   Pain in joint of left shoulder 11/08/2019   Cervical radiculopathy 05/24/2019   Trigeminal neuralgia 11/05/2018   Symptomatic states associated with artificial menopause 11/05/2018   Arthralgia of hand, right 11/17/2017   Vitamin D deficiency 02/18/2017   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   Other specified disorders of uterus 10/12/2015   Herpes  simplex 06/27/2015   Hyperlipidemia 05/30/2015   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   Normocytic anemia 08/21/2014   Vitamin B 12 deficiency 08/21/2014   Allergic rhinitis 01/19/2014   Condyloma acuminatum 01/09/2014   Chronic pain 08/09/2013   Cervical dysplasia 05/09/2013   Other headache syndrome 05/09/2013   Arthralgia of temporomandibular joint 05/09/2013   Cervical pain 08/25/2012   Pain in joint, pelvic region and thigh 07/16/2012   Tethered spinal cord (HCC) 07/16/2012   Balance disorder 07/16/2012   Personal history of fall 07/16/2012   Neurogenic bladder 06/25/2011   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   OTHER SPECIFIED CONGENITAL ANOMALY SPINAL CORD 05/08/2010   Congenital anomaly of brain, spinal cord, 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   H N P-LUMBAR 04/23/2010    Backache 01/28/2010   Papanicolaou smear of cervix with atypical squamous cells cannot exclude high grade squamous intraepithelial lesion (ASC-H) 04/14/1898    PCP: [facility]  REFERRING PROVIDER: Janice Coffin, PA-C  REFERRING DIAG: [phone] (ICD-10-CM) - Lower extremity weakness  THERAPY DIAG:  Muscle weakness (generalized)  Unsteadiness on feet  Abnormal posture  Rationale for Evaluation and Treatment: Rehabilitation  ONSET DATE: November 2021 when initial foot cancer removed  SUBJECTIVE:   SUBJECTIVE STATEMENT: Pt states her pain is some better today and she feels up for STG assessment.  She states something in the right foot feels like it catches.  She denies recent falls or other acute issues.    PERTINENT HISTORY: Myelomeningocele with surgical repair (w/o detethering),  self-catheterizes for neurogenic bladder, Cervical cancer, HPV, recurrent UTIs, HSV , chronic rt hip pain, right foot melanoma s/p excision, trigeminal neuralgia, left mild chronic C7 radiculopathy  PAIN:  Are you having pain? Yes: NPRS scale: 7-8/10 Pain location: right foot Pain description: radiating into right leg Aggravating factors: standing still, sitting for long periods of time, PF Relieving factors: laying down, ice and heat  PRECAUTIONS: Fall  RED FLAGS: Bowel or bladder incontinence: Yes: self-caths and self-braces for bowel movements    WEIGHT BEARING RESTRICTIONS: No  FALLS:  Has patient fallen in last 6 months? No - did have a significant fall down steps in Spring 2022  LIVING ENVIRONMENT: Lives with: lives with their daughter Lives in: House/apartment Stairs: Yes: External: 3 steps; none Has following equipment at home: Single point cane, Shower bench, and Grab bars  OCCUPATION: disabled  PLOF: Independent  PATIENT GOALS: "To minimize some of the new pain"  NEXT MD VISIT: Deirdre Evener, MD 01/26/2023 (MD that manages excision site)  OBJECTIVE:   Note: Objective measures were completed at Evaluation unless otherwise noted.  DIAGNOSTIC FINDINGS: No recent relevant imaging.  PATIENT SURVEYS:  LEFS 47/80 = moderate level of difficulty  COGNITION: Overall cognitive status: Within functional limits for tasks assessed     SENSATION: Light touch: WFL  EDEMA:  None noted in BLE  POSTURE: No Significant postural limitations  PALPATION: She has radiating nerve type pain when pressing on excision upward into shin  LOWER EXTREMITY ROM:  Active ROM Right eval Left eval  Hip flexion    Hip extension    Hip abduction    Hip adduction    Hip internal rotation    Hip external rotation    Knee flexion    Knee extension    Ankle dorsiflexion Limited in DF and toe extension; hammer toe more pronounced on digits 4-5   Ankle plantarflexion    Ankle inversion    Ankle eversion     (  Blank rows = not tested)  LOWER EXTREMITY MMT:  MMT Right eval Left eval  Hip flexion 3+/5 4+/5  Hip extension    Hip abduction 3+/5 4/5  Hip adduction    Hip internal rotation    Hip external rotation    Knee flexion    Knee extension 4-/5 5/5  Ankle dorsiflexion 2/5 4+/5  Ankle plantarflexion    Ankle inversion    Ankle eversion     (Blank rows = not tested)  LOWER EXTREMITY SPECIAL TESTS:  Ankle special tests: None appropriate for AROM on RLE and chief complaint  FUNCTIONAL TESTS:  5 times sit to stand: 18.57 sec w/ light BUE support MCTSIB: Condition 1: Avg of 3 trials: 30 sec, Condition 2: Avg of 3 trials: 30 sec, Condition 3: Avg of 3 trials: 30 sec, Condition 4: Avg of 3 trials: 8.09 sec, and Total Score: 98.09/120  GAIT: Distance walked: Various clinic distances Assistive device utilized: None Level of assistance: Complete Independence Comments: No notable deviations or LOB on entering/exiting gym.   TODAY'S TREATMENT:                                                                                                                               DATE: 02/23/2023 STG assessment: -Verbally reviewed HEP, pt IND and compliant -Reviewed scar mobilization handout, pt able to verbalize several techniques and goals when prompted, confirms she still has sheet and has been using tool at home. -5xSTS w/ light BUE support on initial reps:  19.03 sec; repeated second time no UE support, clarified maintaining feet on ground b/w reps and anterior lean:  15.31 seconds -mCTSIB condition 4:  -Trial 1:  30 seconds  -Trial 2:  30 seconds  -Trial 3:  30 seconds  -AVERAGE:  30 seconds  -Time spent using IASTM for scar mobilization using patient's coconut oil preference.  Used pinpoint pressure, fingertip scraping, and wide tension to break up scar tissue around and over scar.  Some brief mobilization to medial calcaneal insertion as pt reporting some ongoing soreness. -PT provides passive stretching and joint mobilization to ankle into DF > forefoot folding and fanning > inversion/eversion stretches > DF stretch w/ grade 3 posterior talus mobilizations, gentle mobilization of calcaneus to improve inversion/eversion (grades 2/3)  PATIENT EDUCATION:  Education details: Bring coconut oil from home for next session scar mobilization/STM for safety due to allergy reported 10/15.  Discussed ice/heat alternation and using stretches to manage pain.  Continue HEP. Person educated: Patient Education method: Explanation Education comprehension: verbalized understanding  HOME EXERCISE PROGRAM:   If using tool try zigzag, vertical/diagonal rubbing, anchor distal point and pull opposite edge of scar away from anchor.  Use deep pressure, circles, and light twisting of fingertip over scar.  Use mirror feedback to assess response to mobilization and for input to brain regarding sensation vs visualization.  Access Code: [email or web address] URL: [email or web address] Date: 02/04/2023 Prepared by: Camille Bal  Exercises -  Sit to Stand  with Resistance Around Legs  - 1 x daily - 7 x weekly - 3 sets - 10 reps - Marching with Resistance  - 1 x daily - 4-5 x weekly - 2 sets - 20 reps - Corner Balance Feet Together With Eyes Closed  - 1 x daily - 4 x weekly - 1 sets - 2-3 reps - 30 seconds to 1 minute hold - Seated Toe Flexion Extension PROM  - 1 x daily - 5 x weekly - 2 sets - 10 reps - Seated Great Toe Extension  - 1 x daily - 5 x weekly - 2 sets - 10 reps - Seated Lesser Toes Extension  - 1 x daily - 5 x weekly - 2 sets - 10 reps - Seated Anterior Tibialis Stretch  - 1 x daily - 3-4 x weekly - 1 sets - 2 reps - 45 seconds to 1 minute hold - Forward Backward Monster Walk with Band at Thighs and Counter Support  - 1 x daily - 4 x weekly - 3 sets - 10 reps - [employer] on Counter  - 1 x daily - 4 x weekly - 2 sets - 20 reps  ASSESSMENT:  CLINICAL IMPRESSION: Focus of skilled session today on assessing STGs with patient meeting all goals as written.  Her LE strength is progressing as demonstrated with 5xSTS in corrected form of 15.31 seconds w/o UE support.  Her static balance has improved with average completion time of 30 seconds.  She overall is benefiting from IASTM techniques to manage pain and tissue restrictions.  Will continue per POC.  OBJECTIVE IMPAIRMENTS: decreased activity tolerance, decreased balance, decreased coordination, difficulty walking, decreased ROM, and decreased strength.   ACTIVITY LIMITATIONS: standing, squatting, stairs, transfers, and locomotion level  PARTICIPATION LIMITATIONS: community activity and occupation  PERSONAL FACTORS: Fitness, Past/current experiences, and 1-2 comorbidities: myelomeningocele s/p surgical excision and right foot melanoma s/p excision  are also affecting patient's functional outcome.   REHAB POTENTIAL: Excellent  CLINICAL DECISION MAKING: Stable/uncomplicated  EVALUATION COMPLEXITY: Low   GOALS: Goals reviewed with patient? Yes  SHORT TERM GOALS: Target date:  02/13/2023 Pt will be independent and compliant with initial LE strength and balance HEP in order to maintain functional progress and improve mobility. Baseline:  HEP up-to-date and pt compliant (11/11) Goal status: MET  2.  Pt will demonstrate adequate scar mobilization technique for right foot pain management. Baseline:  Pt able to provide verbalized techniques and modifications for goals of scar mobilization, has current handout of techniques (11/11) Goal status: MET  3.  Pt will decrease 5xSTS to </=15.57 seconds w/o UE support in order to demonstrate decreased risk for falls and improved functional bilateral LE strength and power. Baseline: 18.57 sec w/ light BUE support; 15.31 sec no UE support (11/11) Goal status: MET  4.  Pt will complete condition 4 of mCTSIB with an average of 3 trials >/= 15 seconds in order to demonstrate improved vestibular input.   Baseline:  8.09 sec; 30 seconds (11/11) Goal status: MET  LONG TERM GOALS: Target date: 03/13/2023  Pt will be independent and compliant with advanced LE strength and balance HEP in order to maintain functional progress and improve mobility. Baseline:  To be established. Goal status: INITIAL  2.  Pt will decrease 5xSTS to </=12.57 seconds w/o UE support in order to demonstrate decreased risk for falls and improved functional bilateral LE strength and power. Baseline: 18.57 sec w/ light BUE support Goal status:  INITIAL  3.  Pt will complete condition 4 of mCTSIB with an average of 3 trials >/= 30 seconds in order to demonstrate improved vestibular input.  Baseline: 8.09 sec; 30 seconds (11/11) Goal status: MET PLAN:  PT FREQUENCY: 1x/week  PT DURATION: 8 weeks  PLANNED INTERVENTIONS: Therapeutic exercises, Therapeutic activity, Neuromuscular re-education, Balance training, Gait training, Patient/Family education, Self Care, Joint mobilization, Stair training, Vestibular training, Orthotic/Fit training, DME instructions, Dry  Needling, Electrical stimulation, Moist heat, scar mobilization, Taping, Manual therapy, and Re-evaluation  PLAN FOR NEXT SESSION:  Add balance to HEP -high level balance - EC, scar mobilization-did she bring coconut oil?, functional strengthening/ankle mobility and right hip flexor strength - deadbugs, quadruped/tall kneeling?, blaze pods varied surfaces  Sadie Haber, PT, DPT 02/23/2023, 3:47 PM

## 2023-03-02 ENCOUNTER — Encounter: Payer: Self-pay | Admitting: Physical Therapy

## 2023-03-02 ENCOUNTER — Ambulatory Visit: Payer: Medicaid Other | Admitting: Physical Therapy

## 2023-03-02 DIAGNOSIS — R293 Abnormal posture: Secondary | ICD-10-CM

## 2023-03-02 DIAGNOSIS — R279 Unspecified lack of coordination: Secondary | ICD-10-CM

## 2023-03-02 DIAGNOSIS — R2681 Unsteadiness on feet: Secondary | ICD-10-CM

## 2023-03-02 DIAGNOSIS — M6281 Muscle weakness (generalized): Secondary | ICD-10-CM | POA: Diagnosis not present

## 2023-03-02 NOTE — Therapy (Signed)
OUTPATIENT PHYSICAL THERAPY LOWER EXTREMITY TREATMENT   Patient Name: Melissa Pruitt MRN: [id number] DOB:14-May-1975, 47 y.o., female Today's Date: 03/02/2023  END OF SESSION:   PT End of Session - 03/02/23 1448     Visit Number 7    Number of Visits 9   8 + eval   Date for PT Re-Evaluation 03/20/23   pushed out due to scheduling   Authorization Type [employer]    PT Start Time 1446    PT Stop Time 1530    PT Time Calculation (min) 44 min    Equipment Utilized During Treatment Gait belt    Activity Tolerance Patient tolerated treatment well    Behavior During Therapy [facility] for tasks assessed/performed               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   R mid sole superior moderate atypia    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   R med sole inferior moderate atypia    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   R dorsum lat great toe   Dysplastic nevus 06/11/2020   left prox lat thigh sup, - severe   Dysplastic nevus 06/11/2020   left prox lat thigh inf, - moderate   Dysplastic nevus 06/11/2020   R mid to upper back 2.0cm lat to spine, Severe atypia   Dysplastic nevus 06/11/2020   R distal post thigh, moderate atypia   Dysplastic nevus 08/23/2020   Right labia - mild   Dysplastic nevus 08/12/2021   R lat neck post, moderate atypia   Dysplastic nevus 06/11/2012   L dorsal foot, mild atypia, bx from Dr. Larey Dresser   Dysplastic nevus 01/26/2023   Right lateral anterior sole. Moderate atypia, close to margin.   Dysplastic nevus 01/26/2023   Right lateral sole posterior. Moderate to severe atypia, close to margin.   History of recurrent UTIs    HPV (human papilloma virus) infection    HSV (herpes simplex virus) infection    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[facility])    sacro/coccygeal mass excision   Melanoma (HCC) 02/16/2020   R mid plantar sole, BRESLOW'S  DEPTH/MAXIMUM TUMOR THICKNESS: 0.5 MM, CLARK/ANATOMIC LEVEL: II Excised 03/20/2020   Normocytic anemia 08/21/2014   Tethered spinal cord (HCC)    Vitamin B 12 deficiency 08/21/2014   Past Surgical History:  Procedure Laterality Date   BACK SURGERY     BLADDER SURGERY     COLOSTOMY     after flex sig as 47 year old.    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   Melanoma (HCC) 07/02/2020   Pain in joint of left shoulder 11/08/2019   Cervical radiculopathy 05/24/2019   Trigeminal neuralgia 11/05/2018   Symptomatic states associated with artificial menopause 11/05/2018   Arthralgia of hand, right 11/17/2017   Vitamin D deficiency 02/18/2017   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   Other specified disorders of uterus 10/12/2015   Herpes  simplex 06/27/2015   Hyperlipidemia 05/30/2015   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   Normocytic anemia 08/21/2014   Vitamin B 12 deficiency 08/21/2014   Allergic rhinitis 01/19/2014   Condyloma acuminatum 01/09/2014   Chronic pain 08/09/2013   Cervical dysplasia 05/09/2013   Other headache syndrome 05/09/2013   Arthralgia of temporomandibular joint 05/09/2013   Cervical pain 08/25/2012   Pain in joint, pelvic region and thigh 07/16/2012   Tethered spinal cord (HCC) 07/16/2012   Balance disorder 07/16/2012   Personal history of fall 07/16/2012   Neurogenic bladder 06/25/2011   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[facility]) 12/23/2010   OTHER SPECIFIED CONGENITAL ANOMALY SPINAL CORD 05/08/2010   Congenital anomaly of brain, spinal cord, 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   H N P-LUMBAR 04/23/2010    Backache 01/28/2010   Papanicolaou smear of cervix with atypical squamous cells cannot exclude high grade squamous intraepithelial lesion (ASC-H) 04/14/1898    PCP: [facility]  REFERRING PROVIDER: Janice Coffin, PA-C  REFERRING DIAG: [phone] (ICD-10-CM) - Lower extremity weakness  THERAPY DIAG:  Muscle weakness (generalized)  Unsteadiness on feet  Abnormal posture  Unspecified lack of coordination  Rationale for Evaluation and Treatment: Rehabilitation  ONSET DATE: November 2021 when initial foot cancer removed  SUBJECTIVE:   SUBJECTIVE STATEMENT: Pt states she has not done any atypical activity today, but her foot is really hurting her.  She denies recent falls or acute changes.   PERTINENT HISTORY: Myelomeningocele with surgical repair (w/o detethering),  self-catheterizes for neurogenic bladder, Cervical cancer, HPV, recurrent UTIs, HSV , chronic rt hip pain, right foot melanoma s/p excision, trigeminal neuralgia, left mild chronic C7 radiculopathy  PAIN:  Are you having pain? Yes: NPRS scale: 8/10 Pain location: right foot Pain description: radiating into right leg Aggravating factors: standing still, sitting for long periods of time, PF Relieving factors: laying down, ice and heat  PRECAUTIONS: Fall  RED FLAGS: Bowel or bladder incontinence: Yes: self-caths and self-braces for bowel movements    WEIGHT BEARING RESTRICTIONS: No  FALLS:  Has patient fallen in last 6 months? No - did have a significant fall down steps in Spring 2022  LIVING ENVIRONMENT: Lives with: lives with their daughter Lives in: House/apartment Stairs: Yes: External: 3 steps; none Has following equipment at home: Single point cane, Shower bench, and Grab bars  OCCUPATION: disabled  PLOF: Independent  PATIENT GOALS: "To minimize some of the new pain"  NEXT MD VISIT: Deirdre Evener, MD 01/26/2023 (MD that manages excision site)  OBJECTIVE:  Note: Objective  measures were completed at Evaluation unless otherwise noted.  DIAGNOSTIC FINDINGS: No recent relevant imaging.  PATIENT SURVEYS:  LEFS 47/80 = moderate level of difficulty  COGNITION: Overall cognitive status: Within functional limits for tasks assessed     SENSATION: Light touch: WFL  EDEMA:  None noted in BLE  POSTURE: No Significant postural limitations  PALPATION: She has radiating nerve type pain when pressing on excision upward into shin  LOWER EXTREMITY ROM:  Active ROM Right eval Left eval  Hip flexion    Hip extension    Hip abduction    Hip adduction    Hip internal rotation    Hip external rotation    Knee flexion    Knee extension    Ankle dorsiflexion Limited in DF and toe extension; hammer toe more pronounced on digits 4-5   Ankle plantarflexion    Ankle inversion    Ankle eversion     (Blank rows = not tested)  LOWER EXTREMITY MMT:  MMT Right eval Left eval  Hip flexion 3+/5 4+/5  Hip extension    Hip abduction 3+/5 4/5  Hip adduction    Hip internal rotation    Hip external rotation    Knee flexion    Knee extension 4-/5 5/5  Ankle dorsiflexion 2/5 4+/5  Ankle plantarflexion    Ankle inversion    Ankle eversion     (Blank rows = not tested)  LOWER EXTREMITY SPECIAL TESTS:  Ankle special tests: None appropriate for AROM on RLE and chief complaint  FUNCTIONAL TESTS:  5 times sit to stand: 18.57 sec w/ light BUE support MCTSIB: Condition 1: Avg of 3 trials: 30 sec, Condition 2: Avg of 3 trials: 30 sec, Condition 3: Avg of 3 trials: 30 sec, Condition 4: Avg of 3 trials: 8.09 sec, and Total Score: 98.09/120  GAIT: Distance walked: Various clinic distances Assistive device utilized: None Level of assistance: Complete Independence Comments: No notable deviations or LOB on entering/exiting gym.   TODAY'S TREATMENT:                                                                                                                               DATE: 03/02/2023 -Time spent using IASTM for scar mobilization using patient's coconut oil preference.  Used pinpoint pressure and quick scraping to break up scar tissue around and over scar.  Some brief mobilization to lateral calcaneal insertion and plantar fascia as pt reporting some ongoing soreness.  Reports pain improvement from 8/10 to 3-4/10 even in weight bearing following. -Supine deadbugs 2x20 -Tall kneel <> heel sit x12 > added 6 lb overhead press 2x12 -Quadruped thread the needles x15 each side, modified right sided task to protect shoulder due to reported sense of strain 6 Blaze pods on random one color taps setting for improved TrA activation and coordination of extremities.  Performed on 1 minute intervals with 30 sec rest periods.  Pt requires SBA guarding. Round 1:  quadruped crawl large rectangle setup.  17 hits. Round 2:  " setup.  27 hits. Round 3:  " setup.  30 hits. Notable errors/deficits:  Mild left wrist pain, pt did not bring brace, but reports tolerable discomfort temporarily.   6 Blaze pods on random one color taps setting for improved dynamic SLS and hip flexion.  Performed on 1 minute intervals with 30 sec rest periods.  Pt requires SBA guarding. Round 1:  lateral stepping LE taps to 6-12" box setup.  27 hits. Round 2:  " setup.  33 hits. Round 3:  " setup.  34 hits. Notable errors/deficits:  Circumduction when attempting 12" box due to functional weakness - cued for approximation to height to improve safety with hip flexion attempts.  Excessive trunk lean without LOB.  PATIENT EDUCATION:  Education details: Bring coconut oil from home for next session scar mobilization/STM for safety due to allergy reported 10/15.  Continue HEP - add calcaneal  STM to techniques already performing. Person educated: Patient Education method: Explanation Education comprehension: verbalized understanding  HOME EXERCISE PROGRAM:   If using tool try zigzag, vertical/diagonal  rubbing, anchor distal point and pull opposite edge of scar away from anchor.  Use deep pressure, circles, and light twisting of fingertip over scar.  Use mirror feedback to assess response to mobilization and for input to brain regarding sensation vs visualization.  Access Code: [email or web address] URL: [email or web address] Date: 02/04/2023 Prepared by: Camille Bal  Exercises - Sit to Stand with Resistance Around Legs  - 1 x daily - 7 x weekly - 3 sets - 10 reps - Marching with Resistance  - 1 x daily - 4-5 x weekly - 2 sets - 20 reps - Corner Balance Feet Together With Eyes Closed  - 1 x daily - 4 x weekly - 1 sets - 2-3 reps - 30 seconds to 1 minute hold - Seated Toe Flexion Extension PROM  - 1 x daily - 5 x weekly - 2 sets - 10 reps - Seated Great Toe Extension  - 1 x daily - 5 x weekly - 2 sets - 10 reps - Seated Lesser Toes Extension  - 1 x daily - 5 x weekly - 2 sets - 10 reps - Seated Anterior Tibialis Stretch  - 1 x daily - 3-4 x weekly - 1 sets - 2 reps - 45 seconds to 1 minute hold - Forward Backward Monster Walk with Band at Thighs and Counter Support  - 1 x daily - 4 x weekly - 3 sets - 10 reps - [employer] on Counter  - 1 x daily - 4 x weekly - 2 sets - 20 reps  ASSESSMENT:  CLINICAL IMPRESSION: Patient continues to show benefit from scar and soft tissue mobilization.  She had great pain response to techniques today which improved weight bearing tolerance at end of session.  She demonstrates some ongoing hip flexor compensation with dynamic SLS tasks, but managed all mat tasks well minus needed modifications for right shoulder pain.  Will continue per POC.  OBJECTIVE IMPAIRMENTS: decreased activity tolerance, decreased balance, decreased coordination, difficulty walking, decreased ROM, and decreased strength.   ACTIVITY LIMITATIONS: standing, squatting, stairs, transfers, and locomotion level  PARTICIPATION LIMITATIONS: community activity and  occupation  PERSONAL FACTORS: Fitness, Past/current experiences, and 1-2 comorbidities: myelomeningocele s/p surgical excision and right foot melanoma s/p excision  are also affecting patient's functional outcome.   REHAB POTENTIAL: Excellent  CLINICAL DECISION MAKING: Stable/uncomplicated  EVALUATION COMPLEXITY: Low   GOALS: Goals reviewed with patient? Yes  SHORT TERM GOALS: Target date: 02/13/2023 Pt will be independent and compliant with initial LE strength and balance HEP in order to maintain functional progress and improve mobility. Baseline:  HEP up-to-date and pt compliant (11/11) Goal status: MET  2.  Pt will demonstrate adequate scar mobilization technique for right foot pain management. Baseline:  Pt able to provide verbalized techniques and modifications for goals of scar mobilization, has current handout of techniques (11/11) Goal status: MET  3.  Pt will decrease 5xSTS to </=15.57 seconds w/o UE support in order to demonstrate decreased risk for falls and improved functional bilateral LE strength and power. Baseline: 18.57 sec w/ light BUE support; 15.31 sec no UE support (11/11) Goal status: MET  4.  Pt will complete condition 4 of mCTSIB with an average of 3 trials >/= 15 seconds in order to demonstrate improved vestibular input.   Baseline:  8.09 sec; 30 seconds (11/11) Goal  status: MET  LONG TERM GOALS: Target date: 03/13/2023  Pt will be independent and compliant with advanced LE strength and balance HEP in order to maintain functional progress and improve mobility. Baseline:  To be established. Goal status: INITIAL  2.  Pt will decrease 5xSTS to </=12.57 seconds w/o UE support in order to demonstrate decreased risk for falls and improved functional bilateral LE strength and power. Baseline: 18.57 sec w/ light BUE support Goal status: INITIAL  3.  Pt will complete condition 4 of mCTSIB with an average of 3 trials >/= 30 seconds in order to demonstrate  improved vestibular input.  Baseline: 8.09 sec; 30 seconds (11/11) Goal status: MET PLAN:  PT FREQUENCY: 1x/week  PT DURATION: 8 weeks  PLANNED INTERVENTIONS: Therapeutic exercises, Therapeutic activity, Neuromuscular re-education, Balance training, Gait training, Patient/Family education, Self Care, Joint mobilization, Stair training, Vestibular training, Orthotic/Fit training, DME instructions, Dry Needling, Electrical stimulation, Moist heat, scar mobilization, Taping, Manual therapy, and Re-evaluation  PLAN FOR NEXT SESSION:  Add balance to HEP -high level balance - EC, scar mobilization-did she bring coconut oil?, functional strengthening/ankle mobility and right hip flexor strength -quadruped/tall kneeling?, blaze pods varied surfaces - add compliant surface  Sadie Haber, PT, DPT 03/02/2023, 4:11 PM

## 2023-03-09 ENCOUNTER — Ambulatory Visit: Payer: Medicaid Other | Admitting: Physical Therapy

## 2023-03-09 ENCOUNTER — Encounter: Payer: Self-pay | Admitting: Physical Therapy

## 2023-03-09 DIAGNOSIS — M6281 Muscle weakness (generalized): Secondary | ICD-10-CM

## 2023-03-09 DIAGNOSIS — R293 Abnormal posture: Secondary | ICD-10-CM

## 2023-03-09 DIAGNOSIS — R279 Unspecified lack of coordination: Secondary | ICD-10-CM

## 2023-03-09 DIAGNOSIS — R2681 Unsteadiness on feet: Secondary | ICD-10-CM

## 2023-03-09 NOTE — Therapy (Signed)
OUTPATIENT PHYSICAL THERAPY LOWER EXTREMITY TREATMENT   Patient Name: Melissa Pruitt MRN: [id number] DOB:Jul 19, 1975, 47 y.o., female Today's Date: 03/09/2023  END OF SESSION:   PT End of Session - 03/09/23 1453     Visit Number 8    Number of Visits 9   8 + eval   Date for PT Re-Evaluation 03/20/23   pushed out due to scheduling   Authorization Type [employer]    PT Start Time [phone]    PT Stop Time 1532    PT Time Calculation (min) 43 min    Equipment Utilized During Treatment Gait belt    Activity Tolerance Patient tolerated treatment well    Behavior During Therapy [facility] for tasks assessed/performed               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   R mid sole superior moderate atypia    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   R med sole inferior moderate atypia    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   R dorsum lat great toe   Dysplastic nevus 06/11/2020   left prox lat thigh sup, - severe   Dysplastic nevus 06/11/2020   left prox lat thigh inf, - moderate   Dysplastic nevus 06/11/2020   R mid to upper back 2.0cm lat to spine, Severe atypia   Dysplastic nevus 06/11/2020   R distal post thigh, moderate atypia   Dysplastic nevus 08/23/2020   Right labia - mild   Dysplastic nevus 08/12/2021   R lat neck post, moderate atypia   Dysplastic nevus 06/11/2012   L dorsal foot, mild atypia, bx from Dr. Larey Dresser   Dysplastic nevus 01/26/2023   Right lateral anterior sole. Moderate atypia, close to margin.   Dysplastic nevus 01/26/2023   Right lateral sole posterior. Moderate to severe atypia, close to margin.   History of recurrent UTIs    HPV (human papilloma virus) infection    HSV (herpes simplex virus) infection    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[facility])    sacro/coccygeal mass excision   Melanoma (HCC) 02/16/2020   R mid plantar sole, BRESLOW'S  DEPTH/MAXIMUM TUMOR THICKNESS: 0.5 MM, CLARK/ANATOMIC LEVEL: II Excised 03/20/2020   Normocytic anemia 08/21/2014   Tethered spinal cord (HCC)    Vitamin B 12 deficiency 08/21/2014   Past Surgical History:  Procedure Laterality Date   BACK SURGERY     BLADDER SURGERY     COLOSTOMY     after flex sig as 47 year old.    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   Melanoma (HCC) 07/02/2020   Pain in joint of left shoulder 11/08/2019   Cervical radiculopathy 05/24/2019   Trigeminal neuralgia 11/05/2018   Symptomatic states associated with artificial menopause 11/05/2018   Arthralgia of hand, right 11/17/2017   Vitamin D deficiency 02/18/2017   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   Other specified disorders of uterus 10/12/2015   Herpes  simplex 06/27/2015   Hyperlipidemia 05/30/2015   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   Normocytic anemia 08/21/2014   Vitamin B 12 deficiency 08/21/2014   Allergic rhinitis 01/19/2014   Condyloma acuminatum 01/09/2014   Chronic pain 08/09/2013   Cervical dysplasia 05/09/2013   Other headache syndrome 05/09/2013   Arthralgia of temporomandibular joint 05/09/2013   Cervical pain 08/25/2012   Pain in joint, pelvic region and thigh 07/16/2012   Tethered spinal cord (HCC) 07/16/2012   Balance disorder 07/16/2012   Personal history of fall 07/16/2012   Neurogenic bladder 06/25/2011   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[facility]) 12/23/2010   OTHER SPECIFIED CONGENITAL ANOMALY SPINAL CORD 05/08/2010   Congenital anomaly of brain, spinal cord, 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   H N P-LUMBAR 04/23/2010    Backache 01/28/2010   Papanicolaou smear of cervix with atypical squamous cells cannot exclude high grade squamous intraepithelial lesion (ASC-H) 04/14/1898    PCP: [facility]  REFERRING PROVIDER: Janice Coffin, PA-C  REFERRING DIAG: [phone] (ICD-10-CM) - Lower extremity weakness  THERAPY DIAG:  Muscle weakness (generalized)  Unsteadiness on feet  Abnormal posture  Unspecified lack of coordination  Rationale for Evaluation and Treatment: Rehabilitation  ONSET DATE: November 2021 when initial foot cancer removed  SUBJECTIVE:   SUBJECTIVE STATEMENT: She was mildly sore after last session, but this resolved in a day or so.  She denies falls or acute changes.  PERTINENT HISTORY: Myelomeningocele with surgical repair (w/o detethering),  self-catheterizes for neurogenic bladder, Cervical cancer, HPV, recurrent UTIs, HSV , chronic rt hip pain, right foot melanoma s/p excision, trigeminal neuralgia, left mild chronic C7 radiculopathy  PAIN:  Are you having pain? Yes: NPRS scale: 5-6/10 Pain location: right foot Pain description: radiating into right leg Aggravating factors: standing still, sitting for long periods of time, PF Relieving factors: laying down, ice and heat  PRECAUTIONS: Fall  RED FLAGS: Bowel or bladder incontinence: Yes: self-caths and self-braces for bowel movements    WEIGHT BEARING RESTRICTIONS: No  FALLS:  Has patient fallen in last 6 months? No - did have a significant fall down steps in Spring 2022  LIVING ENVIRONMENT: Lives with: lives with their daughter Lives in: House/apartment Stairs: Yes: External: 3 steps; none Has following equipment at home: Single point cane, Shower bench, and Grab bars  OCCUPATION: disabled  PLOF: Independent  PATIENT GOALS: "To minimize some of the new pain"  NEXT MD VISIT: Deirdre Evener, MD 01/26/2023 (MD that manages excision site)  OBJECTIVE:  Note: Objective measures were completed at  Evaluation unless otherwise noted.  DIAGNOSTIC FINDINGS: No recent relevant imaging.  PATIENT SURVEYS:  LEFS 47/80 = moderate level of difficulty  COGNITION: Overall cognitive status: Within functional limits for tasks assessed     SENSATION: Light touch: WFL  EDEMA:  None noted in BLE  POSTURE: No Significant postural limitations  PALPATION: She has radiating nerve type pain when pressing on excision upward into shin  LOWER EXTREMITY ROM:  Active ROM Right eval Left eval  Hip flexion    Hip extension    Hip abduction    Hip adduction    Hip internal rotation    Hip external rotation    Knee flexion    Knee extension    Ankle dorsiflexion Limited in DF and toe extension; hammer toe more pronounced on digits 4-5   Ankle plantarflexion    Ankle inversion    Ankle eversion     (Blank rows = not tested)  LOWER EXTREMITY MMT:  MMT Right eval Left eval  Hip flexion 3+/5 4+/5  Hip extension    Hip abduction 3+/5 4/5  Hip adduction    Hip internal rotation    Hip external rotation    Knee flexion    Knee extension 4-/5 5/5  Ankle dorsiflexion 2/5 4+/5  Ankle plantarflexion    Ankle inversion    Ankle eversion     (Blank rows = not tested)  LOWER EXTREMITY SPECIAL TESTS:  Ankle special tests: None appropriate for AROM on RLE and chief complaint  FUNCTIONAL TESTS:  5 times sit to stand: 18.57 sec w/ light BUE support MCTSIB: Condition 1: Avg of 3 trials: 30 sec, Condition 2: Avg of 3 trials: 30 sec, Condition 3: Avg of 3 trials: 30 sec, Condition 4: Avg of 3 trials: 8.09 sec, and Total Score: 98.09/120  GAIT: Distance walked: Various clinic distances Assistive device utilized: None Level of assistance: Complete Independence Comments: No notable deviations or LOB on entering/exiting gym.   TODAY'S TREATMENT:                                                                                                                              DATE: 03/09/2023 Baps  board BLE CW and CCW circles 3x10 each direction Standing soccer ball stop and return firm surface > standing on airex soccer ball stop and return alternating LE 6" step up and overs no UE support x20 > step up contralateral hip drives 2x10 each LE no UE support > repeated hip drives task w/ soft side of BOSU for SL challenge w/o UE support 6 Blaze pods on random one color taps setting for improved dynamic SLS and hip flexor engagement.  Performed on 1 minute intervals with 30 second rest periods.  Pt requires SBA guarding. Round 1:  First 3 6" steps no UE support setup alternating LE taps.  47 hits. Round 2:  " setup.  39 hits. Round 3:  " setup.  46 hits. Notable errors/deficits:  No overt LOB, pt reports good hip flexor challenge.  -Time spent using IASTM for scar mobilization using patient's coconut oil preference.  Used pinpoint pressure and quick scraping to break up scar tissue around and over scar.  Focused on deep scraping this session to promote scar remodeling.  PATIENT EDUCATION:  Education details: Continue HEP and scar mobilization.  Discussed plan for discharge next session with goal assessment. Person educated: Patient Education method: Explanation Education comprehension: verbalized understanding  HOME EXERCISE PROGRAM:   If using tool try zigzag, vertical/diagonal rubbing, anchor distal point and pull opposite edge of scar away from anchor.  Use deep pressure, circles, and light twisting of fingertip over scar.  Use mirror feedback to assess response to mobilization and for input to brain regarding sensation vs visualization.  Access Code: [email or web address] URL: [email or web address] Date: 02/04/2023 Prepared by: Camille Bal  Exercises - Sit to Stand with Resistance Around Legs  - 1 x daily - 7  x weekly - 3 sets - 10 reps - Marching with Resistance  - 1 x daily - 4-5 x weekly - 2 sets - 20 reps - Corner Balance Feet Together With Eyes Closed  - 1 x daily - 4  x weekly - 1 sets - 2-3 reps - 30 seconds to 1 minute hold - Seated Toe Flexion Extension PROM  - 1 x daily - 5 x weekly - 2 sets - 10 reps - Seated Great Toe Extension  - 1 x daily - 5 x weekly - 2 sets - 10 reps - Seated Lesser Toes Extension  - 1 x daily - 5 x weekly - 2 sets - 10 reps - Seated Anterior Tibialis Stretch  - 1 x daily - 3-4 x weekly - 1 sets - 2 reps - 45 seconds to 1 minute hold - Forward Backward Monster Walk with Band at [employer] and Counter Support  - 1 x daily - 4 x weekly - 3 sets - 10 reps - [employer] on Counter  - 1 x daily - 4 x weekly - 2 sets - 20 reps  ASSESSMENT:  CLINICAL IMPRESSION: Patient does well with all dynamic SLS tasks this session demonstrating improved ankle strategy.  She is also showing signs of good scar remodeling over course of sessions with flattening and progression to lighter color as well as noticeable decrease in tight tissue on plantar aspect of foot.  She is due for goal assessment with discharge planned for final session.  OBJECTIVE IMPAIRMENTS: decreased activity tolerance, decreased balance, decreased coordination, difficulty walking, decreased ROM, and decreased strength.   ACTIVITY LIMITATIONS: standing, squatting, stairs, transfers, and locomotion level  PARTICIPATION LIMITATIONS: community activity and occupation  PERSONAL FACTORS: Fitness, Past/current experiences, and 1-2 comorbidities: myelomeningocele s/p surgical excision and right foot melanoma s/p excision  are also affecting patient's functional outcome.   REHAB POTENTIAL: Excellent  CLINICAL DECISION MAKING: Stable/uncomplicated  EVALUATION COMPLEXITY: Low   GOALS: Goals reviewed with patient? Yes  SHORT TERM GOALS: Target date: 02/13/2023 Pt will be independent and compliant with initial LE strength and balance HEP in order to maintain functional progress and improve mobility. Baseline:  HEP up-to-date and pt compliant (11/11) Goal status: MET  2.  Pt will  demonstrate adequate scar mobilization technique for right foot pain management. Baseline:  Pt able to provide verbalized techniques and modifications for goals of scar mobilization, has current handout of techniques (11/11) Goal status: MET  3.  Pt will decrease 5xSTS to </=15.57 seconds w/o UE support in order to demonstrate decreased risk for falls and improved functional bilateral LE strength and power. Baseline: 18.57 sec w/ light BUE support; 15.31 sec no UE support (11/11) Goal status: MET  4.  Pt will complete condition 4 of mCTSIB with an average of 3 trials >/= 15 seconds in order to demonstrate improved vestibular input.   Baseline:  8.09 sec; 30 seconds (11/11) Goal status: MET  LONG TERM GOALS: Target date: 03/13/2023  Pt will be independent and compliant with advanced LE strength and balance HEP in order to maintain functional progress and improve mobility. Baseline:  To be established. Goal status: INITIAL  2.  Pt will decrease 5xSTS to </=12.57 seconds w/o UE support in order to demonstrate decreased risk for falls and improved functional bilateral LE strength and power. Baseline: 18.57 sec w/ light BUE support Goal status: INITIAL  3.  Pt will complete condition 4 of mCTSIB with an average of 3 trials >/= 30  seconds in order to demonstrate improved vestibular input.  Baseline: 8.09 sec; 30 seconds (11/11) Goal status: MET PLAN:  PT FREQUENCY: 1x/week  PT DURATION: 8 weeks  PLANNED INTERVENTIONS: Therapeutic exercises, Therapeutic activity, Neuromuscular re-education, Balance training, Gait training, Patient/Family education, Self Care, Joint mobilization, Stair training, Vestibular training, Orthotic/Fit training, DME instructions, Dry Needling, Electrical stimulation, Moist heat, scar mobilization, Taping, Manual therapy, and Re-evaluation  PLAN FOR NEXT SESSION:  ASSESS LTGs - D/C!  Sadie Haber, PT, DPT 03/09/2023, 3:46 PM

## 2023-03-16 ENCOUNTER — Ambulatory Visit: Payer: Medicaid Other | Attending: Nurse Practitioner | Admitting: Physical Therapy

## 2023-03-16 ENCOUNTER — Encounter: Payer: Self-pay | Admitting: Physical Therapy

## 2023-03-16 DIAGNOSIS — R279 Unspecified lack of coordination: Secondary | ICD-10-CM | POA: Diagnosis present

## 2023-03-16 DIAGNOSIS — R2681 Unsteadiness on feet: Secondary | ICD-10-CM

## 2023-03-16 DIAGNOSIS — M6281 Muscle weakness (generalized): Secondary | ICD-10-CM

## 2023-03-16 DIAGNOSIS — R293 Abnormal posture: Secondary | ICD-10-CM

## 2023-03-16 NOTE — Therapy (Signed)
OUTPATIENT PHYSICAL THERAPY LOWER EXTREMITY TREATMENT - DISCHARGE SUMMARY   Patient Name: Melissa Pruitt MRN: [id number] DOB:1976-01-23, 47 y.o., female Today's Date: 03/16/2023  PHYSICAL THERAPY DISCHARGE SUMMARY  Visits from Start of Care: 9  Current functional level related to goals / functional outcomes: See clinical impression statement.   Remaining deficits: Mild functional LE weakness and scarring of right foot   Education / Equipment: Continue HEP and scar mobilization.  Discussed plan for discharge today.  Spike ball vs tennis ball for self mobs.  Return as needed with new referral - discussed ongoing issues that PT can further address minus pelvic floor at this specific facility.   Patient agrees to discharge. Patient goals were partially met. Patient is being discharged due to  Medicaid visit limit.   END OF SESSION:   PT End of Session - 03/16/23 1455     Visit Number 9    Number of Visits 9   8 + eval   Date for PT Re-Evaluation 03/20/23   pushed out due to scheduling   Authorization Type [employer]    PT Start Time 1450    PT Stop Time 1530    PT Time Calculation (min) 40 min    Equipment Utilized During Treatment Gait belt    Activity Tolerance Patient tolerated treatment well    Behavior During Therapy [facility] for tasks assessed/performed               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   R mid sole superior moderate atypia    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   R med sole inferior moderate atypia    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   R dorsum lat great toe   Dysplastic nevus 06/11/2020   left prox lat thigh sup, - severe   Dysplastic nevus 06/11/2020   left prox lat thigh inf, - moderate   Dysplastic nevus 06/11/2020   R mid to upper back 2.0cm lat to spine, Severe atypia   Dysplastic nevus 06/11/2020   R distal post thigh, moderate  atypia   Dysplastic nevus 08/23/2020   Right labia - mild   Dysplastic nevus 08/12/2021   R lat neck post, moderate atypia   Dysplastic nevus 06/11/2012   L dorsal foot, mild atypia, bx from Dr. Larey Dresser   Dysplastic nevus 01/26/2023   Right lateral anterior sole. Moderate atypia, close to margin.   Dysplastic nevus 01/26/2023   Right lateral sole posterior. Moderate to severe atypia, close to margin.   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   Melanoma (HCC) 07/02/2020   Pain in joint of left shoulder 11/08/2019   Cervical radiculopathy 05/24/2019   Trigeminal neuralgia 11/05/2018   Symptomatic states associated with artificial menopause 11/05/2018   Arthralgia of hand, right 11/17/2017   Vitamin D deficiency 02/18/2017   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   Other specified disorders of uterus 10/12/2015   Herpes simplex 06/27/2015   Hyperlipidemia 05/30/2015   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   Normocytic anemia 08/21/2014   Vitamin B 12 deficiency  08/21/2014   Allergic rhinitis 01/19/2014   Condyloma acuminatum 01/09/2014   Chronic pain 08/09/2013   Cervical dysplasia 05/09/2013   Other headache syndrome 05/09/2013   Arthralgia of temporomandibular joint 05/09/2013   Cervical pain 08/25/2012   Pain in joint, pelvic region and thigh 07/16/2012   Tethered spinal cord (HCC) 07/16/2012   Balance disorder 07/16/2012   Personal history of fall 07/16/2012   Neurogenic bladder 06/25/2011   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   OTHER SPECIFIED CONGENITAL ANOMALY SPINAL CORD 05/08/2010   Congenital anomaly of brain, spinal cord, 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   H N P-LUMBAR 04/23/2010   Backache 01/28/2010   Papanicolaou smear of cervix with atypical squamous cells cannot exclude high grade squamous intraepithelial lesion (ASC-H) 04/14/1898    PCP: [facility]  REFERRING PROVIDER: Janice Coffin, PA-C  REFERRING DIAG: [phone] (ICD-10-CM) - Lower extremity weakness  THERAPY DIAG:  Muscle weakness (generalized)  Unsteadiness on feet  Abnormal posture  Unspecified lack of coordination  Rationale for Evaluation and Treatment: Rehabilitation  ONSET DATE: November 2021 when initial foot cancer removed  SUBJECTIVE:   SUBJECTIVE STATEMENT: She was sore in her foot yesterday, but worked on it some and showed partner techniques with tool.  She denies falls or acute changes.  PERTINENT HISTORY: Myelomeningocele with surgical repair (w/o detethering),  self-catheterizes for neurogenic bladder, Cervical cancer, HPV, recurrent UTIs, HSV , chronic rt hip pain, right foot melanoma s/p excision, trigeminal neuralgia, left mild chronic C7 radiculopathy  PAIN:  Are you having pain? Yes: NPRS scale: 4-5/10 Pain location: right foot Pain description: achy, sore, tight Aggravating factors: standing still, sitting for long periods of time, PF Relieving factors: laying down, ice and  heat  PRECAUTIONS: Fall  RED FLAGS: Bowel or bladder incontinence: Yes: self-caths and self-braces for bowel movements    WEIGHT BEARING RESTRICTIONS: No  FALLS:  Has patient fallen in last 6 months? No - did have a significant fall down steps in Spring 2022  LIVING ENVIRONMENT: Lives with: lives with their daughter Lives in: House/apartment Stairs: Yes: External: 3 steps; none Has following equipment at home: Single point cane, Shower bench, and Grab bars  OCCUPATION: disabled  PLOF: Independent  PATIENT GOALS: "To minimize some of the new pain"  NEXT MD VISIT: Deirdre Evener, MD 01/26/2023 (MD that manages excision site)  OBJECTIVE:  Note: Objective measures were completed at Evaluation unless otherwise noted.  DIAGNOSTIC FINDINGS: No recent relevant imaging.  PATIENT SURVEYS:  LEFS 47/80 = moderate level of difficulty  COGNITION: Overall cognitive status: Within functional limits for tasks assessed     SENSATION: Light touch: WFL  EDEMA:  None noted in BLE  POSTURE: No Significant postural limitations  PALPATION: She has radiating nerve type pain when pressing on excision upward into shin  LOWER EXTREMITY ROM:  Active ROM Right eval Left eval  Hip flexion    Hip extension    Hip abduction    Hip adduction    Hip internal rotation    Hip external rotation    Knee flexion    Knee extension    Ankle dorsiflexion Limited in DF and toe extension; hammer toe more pronounced on digits 4-5   Ankle plantarflexion    Ankle inversion    Ankle eversion     (Blank rows = not tested)  LOWER EXTREMITY MMT:  MMT Right eval Left eval  Hip flexion 3+/5 4+/5  Hip extension    Hip abduction 3+/5 4/5  Hip adduction    Hip internal rotation    Hip external rotation    Knee flexion    Knee extension 4-/5 5/5  Ankle dorsiflexion 2/5 4+/5  Ankle plantarflexion    Ankle inversion    Ankle eversion     (Blank rows = not tested)  LOWER EXTREMITY SPECIAL  TESTS:  Ankle special tests: None appropriate for AROM on RLE and chief complaint  FUNCTIONAL TESTS:  5 times sit to stand: 18.57 sec w/ light BUE support MCTSIB: Condition 1: Avg of 3 trials: 30 sec, Condition 2: Avg of 3 trials: 30 sec, Condition 3: Avg of 3 trials: 30 sec, Condition 4: Avg of 3 trials: 8.09 sec, and Total Score: 98.09/120  GAIT: Distance walked: Various clinic distances Assistive device utilized: None Level of assistance: Complete Independence Comments: No notable deviations or LOB on entering/exiting gym.   TODAY'S TREATMENT:                                                                                                                              DATE: 03/16/2023 Reprinted HEP and scar mobilization techniques per pt request. 5xSTS:  14.03 sec no UE support IASTM scraping over plantar right foot scar and medial calcaneal insertion, STM to posterior tib tendon and achilles tendon w/ passive stretch as pt describes pain in these areas today.  Instructed pt in use of lacrosse ball (alternative of tennis ball), soft spike ball and hard spike ball with pt using these for soft tissue mobilization to plantar aspect of foot for several minutes.  PATIENT EDUCATION:  Education details: Continue HEP and scar mobilization.  Discussed plan for discharge today.  Spike ball vs tennis ball for self mobs.  Return as needed with new referral - discussed ongoing issues that PT can further address minus pelvic floor at this specific facility. Person educated: Patient Education method: Explanation Education comprehension: verbalized understanding  HOME EXERCISE PROGRAM:   If using tool try zigzag, vertical/diagonal rubbing, anchor distal point and pull opposite edge of scar away from anchor.  Use deep pressure, circles, and light twisting of fingertip over scar.  Use mirror feedback to assess response to mobilization and for  input to brain regarding sensation vs visualization.  Access  Code: [email or web address] URL: [email or web address] Date: 02/04/2023 Prepared by: Camille Bal  Exercises - Sit to Stand with Resistance Around Legs  - 1 x daily - 7 x weekly - 3 sets - 10 reps - Marching with Resistance  - 1 x daily - 4-5 x weekly - 2 sets - 20 reps - Corner Balance Feet Together With Eyes Closed  - 1 x daily - 4 x weekly - 1 sets - 2-3 reps - 30 seconds to 1 minute hold - Seated Toe Flexion Extension PROM  - 1 x daily - 5 x weekly - 2 sets - 10 reps - Seated Great Toe Extension  - 1 x daily - 5 x weekly - 2 sets - 10 reps - Seated Lesser Toes Extension  - 1 x daily - 5 x weekly - 2 sets - 10 reps - Seated Anterior Tibialis Stretch  - 1 x daily - 3-4 x weekly - 1 sets - 2 reps - 45 seconds to 1 minute hold - Forward Backward Monster Walk with Band at [employer] and Counter Support  - 1 x daily - 4 x weekly - 3 sets - 10 reps - [employer] on Counter  - 1 x daily - 4 x weekly - 2 sets - 20 reps  ASSESSMENT:  CLINICAL IMPRESSION: Patient has made great progress in management of plantar foot pain and scar mobilization.  Her 5xSTS shows improvement to 14.03 seconds w/o UE support showing significant improvement in tolerance to and performance of functional transfers as well as LE strengthening.  She would likely benefit from episodic PT in the future to prevent functional decline from pain or other weakness or imbalance.  Will discharge to self-management at this time.  OBJECTIVE IMPAIRMENTS: decreased activity tolerance, decreased balance, decreased coordination, difficulty walking, decreased ROM, and decreased strength.   ACTIVITY LIMITATIONS: standing, squatting, stairs, transfers, and locomotion level  PARTICIPATION LIMITATIONS: community activity and occupation  PERSONAL FACTORS: Fitness, Past/current experiences, and 1-2 comorbidities: myelomeningocele s/p surgical excision and right foot melanoma s/p excision  are also affecting patient's functional outcome.    REHAB POTENTIAL: Excellent  CLINICAL DECISION MAKING: Stable/uncomplicated  EVALUATION COMPLEXITY: Low   GOALS: Goals reviewed with patient? Yes  SHORT TERM GOALS: Target date: 02/13/2023 Pt will be independent and compliant with initial LE strength and balance HEP in order to maintain functional progress and improve mobility. Baseline:  HEP up-to-date and pt compliant (11/11) Goal status: MET  2.  Pt will demonstrate adequate scar mobilization technique for right foot pain management. Baseline:  Pt able to provide verbalized techniques and modifications for goals of scar mobilization, has current handout of techniques (11/11) Goal status: MET  3.  Pt will decrease 5xSTS to </=15.57 seconds w/o UE support in order to demonstrate decreased risk for falls and improved functional bilateral LE strength and power. Baseline: 18.57 sec w/ light BUE support; 15.31 sec no UE support (11/11) Goal status: MET  4.  Pt will complete condition 4 of mCTSIB with an average of 3 trials >/= 15 seconds in order to demonstrate improved vestibular input.   Baseline:  8.09 sec; 30 seconds (11/11) Goal status: MET  LONG TERM GOALS: Target date: 03/13/2023  Pt will be independent and compliant with advanced LE strength and balance HEP in order to maintain functional progress and improve mobility. Baseline:  Established and pt compliant (12/2) Goal status: MET  2.  Pt will decrease 5xSTS to </=12.57  seconds w/o UE support in order to demonstrate decreased risk for falls and improved functional bilateral LE strength and power. Baseline: 18.57 sec w/ light BUE support; 14.03 sec no UE support (12/2) Goal status: PARTIALLY MET  3.  Pt will complete condition 4 of mCTSIB with an average of 3 trials >/= 30 seconds in order to demonstrate improved vestibular input.  Baseline: 8.09 sec; 30 seconds (11/11) Goal status: MET PLAN:  PT FREQUENCY: 1x/week  PT DURATION: 8 weeks  PLANNED INTERVENTIONS:  Therapeutic exercises, Therapeutic activity, Neuromuscular re-education, Balance training, Gait training, Patient/Family education, Self Care, Joint mobilization, Stair training, Vestibular training, Orthotic/Fit training, DME instructions, Dry Needling, Electrical stimulation, Moist heat, scar mobilization, Taping, Manual therapy, and Re-evaluation  PLAN FOR NEXT SESSION:  N/A  Sadie Haber, PT, DPT 03/16/2023, 3:41 PM

## 2023-04-14 ENCOUNTER — Ambulatory Visit (INDEPENDENT_AMBULATORY_CARE_PROVIDER_SITE_OTHER): Payer: Medicaid Other

## 2023-04-14 VITALS — BP 110/70 | HR 93

## 2023-04-14 DIAGNOSIS — R350 Frequency of micturition: Secondary | ICD-10-CM

## 2023-04-14 DIAGNOSIS — R300 Dysuria: Secondary | ICD-10-CM

## 2023-04-14 LAB — POCT URINALYSIS DIPSTICK
Bilirubin, UA: NEGATIVE
Glucose, UA: NEGATIVE
Ketones, UA: NEGATIVE
Nitrite, UA: POSITIVE
Protein, UA: POSITIVE — AB
Spec Grav, UA: 1.02 (ref 1.010–1.025)
Urobilinogen, UA: 1 U/dL
pH, UA: 6 (ref 5.0–8.0)

## 2023-04-14 NOTE — Progress Notes (Signed)
Melissa Pruitt is a 47 y.o. female  arrived today with UTI sx.  Per Dr. Jari Favre protocol: A urine specimen was collected and POCT Urine was done and urine culture sent to the lab. POCT Urine was Positive

## 2023-04-14 NOTE — Patient Instructions (Signed)
Your Urine dip that was done in office was Positive. I am sending the urine off for culture and you can take AZO over the counter for your discomfort.  We will contact you when the results are back between 3-5 days. If an antibiotic is needed we will sent the order to the pharmacy and you will be notified. If you have any questions or concerns please feel free to call us at [phone]

## 2023-04-20 ENCOUNTER — Other Ambulatory Visit: Payer: Self-pay | Admitting: Obstetrics and Gynecology

## 2023-04-20 ENCOUNTER — Encounter: Payer: Self-pay | Admitting: Obstetrics and Gynecology

## 2023-04-20 DIAGNOSIS — N390 Urinary tract infection, site not specified: Secondary | ICD-10-CM

## 2023-04-20 MED ORDER — AMOXICILLIN-POT CLAVULANATE 875-125 MG PO TABS: 1.0000 | ORAL_TABLET | Freq: Two times a day (BID) | ORAL | 0 refills | Status: DC

## 2023-04-20 NOTE — Progress Notes (Signed)
 [facility] urine culture showed pan-sensitive E.coli >100,000 cells/ml.   Augmentin sent to pharmacy for patient.

## 2023-05-07 ENCOUNTER — Ambulatory Visit: Payer: Medicaid Other | Admitting: Cardiology

## 2023-05-18 ENCOUNTER — Other Ambulatory Visit: Payer: Self-pay | Admitting: Obstetrics and Gynecology

## 2023-05-18 DIAGNOSIS — N938 Other specified abnormal uterine and vaginal bleeding: Secondary | ICD-10-CM

## 2023-05-26 ENCOUNTER — Ambulatory Visit: Payer: Medicaid Other | Admitting: Cardiology

## 2023-05-29 ENCOUNTER — Other Ambulatory Visit: Payer: Self-pay

## 2023-05-29 ENCOUNTER — Telehealth: Payer: Self-pay

## 2023-05-29 DIAGNOSIS — E782 Mixed hyperlipidemia: Secondary | ICD-10-CM

## 2023-05-29 DIAGNOSIS — Z131 Encounter for screening for diabetes mellitus: Secondary | ICD-10-CM

## 2023-05-29 DIAGNOSIS — Z1329 Encounter for screening for other suspected endocrine disorder: Secondary | ICD-10-CM

## 2023-05-29 DIAGNOSIS — I10 Essential (primary) hypertension: Secondary | ICD-10-CM

## 2023-05-29 NOTE — Telephone Encounter (Signed)
Order has been placed in epic for patient to have drawn at [facility] location [facility]

## 2023-05-29 NOTE — Telephone Encounter (Signed)
Patient would like a call back, she is asking for her labs to be sent to a [facility] near her, need to find out if she needs them faxed or released

## 2023-05-31 ENCOUNTER — Encounter: Payer: Self-pay | Admitting: Obstetrics and Gynecology

## 2023-06-02 LAB — LIPID PANEL
Chol/HDL Ratio: 2.8 {ratio} (ref 0.0–4.4)
Cholesterol, Total: 196 mg/dL (ref 100–199)
HDL: 69 mg/dL (ref >39)
LDL Chol Calc (NIH): 108 mg/dL — ABNORMAL HIGH (ref 0–99)
Triglycerides: 105 mg/dL (ref 0–149)
VLDL Cholesterol Cal: 19 mg/dL (ref 5–40)

## 2023-06-02 LAB — CMP14+EGFR
ALT: 11 [IU]/L (ref 0–32)
AST: 13 [IU]/L (ref 0–40)
Albumin: 4.2 g/dL (ref 3.9–4.9)
Alkaline Phosphatase: 34 [IU]/L — ABNORMAL LOW (ref 44–121)
BUN/Creatinine Ratio: 17 (ref 9–23)
BUN: 11 mg/dL (ref 6–24)
Bilirubin Total: 0.6 mg/dL (ref 0.0–1.2)
CO2: 25 mmol/L (ref 20–29)
Calcium: 9.1 mg/dL (ref 8.7–10.2)
Chloride: 104 mmol/L (ref 96–106)
Creatinine, Ser: 0.64 mg/dL (ref 0.57–1.00)
Globulin, Total: 1.8 g/dL (ref 1.5–4.5)
Glucose: 87 mg/dL (ref 70–99)
Potassium: 4.6 mmol/L (ref 3.5–5.2)
Sodium: 140 mmol/L (ref 134–144)
Total Protein: 6 g/dL (ref 6.0–8.5)
eGFR: 108 mL/min/{1.73_m2} (ref >59)

## 2023-06-02 LAB — TSH: TSH: 4.81 u[IU]/mL — ABNORMAL HIGH (ref 0.450–4.500)

## 2023-06-02 LAB — HEMOGLOBIN A1C
Est. average glucose Bld gHb Est-mCnc: 100 mg/dL
Hgb A1c MFr Bld: 5.1 % (ref 4.8–5.6)

## 2023-06-05 ENCOUNTER — Ambulatory Visit: Payer: Medicaid Other | Admitting: Cardiology

## 2023-06-11 ENCOUNTER — Encounter: Payer: Self-pay | Admitting: *Deleted

## 2023-06-11 NOTE — Progress Notes (Signed)
 Scanned in previous [facility] results.  Results handled by Waldo Laine on 02/15/23 KD CMA

## 2023-06-12 ENCOUNTER — Ambulatory Visit: Payer: Medicaid Other | Admitting: Cardiology

## 2023-06-12 ENCOUNTER — Encounter: Payer: Self-pay | Admitting: Cardiology

## 2023-06-12 VITALS — BP 122/60 | HR 74 | Ht 64.0 in | Wt 121.0 lb

## 2023-06-12 DIAGNOSIS — E538 Deficiency of other specified B group vitamins: Secondary | ICD-10-CM | POA: Diagnosis not present

## 2023-06-12 DIAGNOSIS — Z91018 Allergy to other foods: Secondary | ICD-10-CM

## 2023-06-12 DIAGNOSIS — E782 Mixed hyperlipidemia: Secondary | ICD-10-CM

## 2023-06-12 DIAGNOSIS — K0889 Other specified disorders of teeth and supporting structures: Secondary | ICD-10-CM | POA: Diagnosis not present

## 2023-06-12 DIAGNOSIS — Z202 Contact with and (suspected) exposure to infections with a predominantly sexual mode of transmission: Secondary | ICD-10-CM

## 2023-06-12 DIAGNOSIS — Z0130 Encounter for examination of blood pressure without abnormal findings: Secondary | ICD-10-CM

## 2023-06-12 MED ORDER — CLINDAMYCIN PHOS-BENZOYL PEROX 1.2-5 % EX GEL: 1.0000 | Freq: Two times a day (BID) | CUTANEOUS | 4 refills | Status: DC

## 2023-06-12 NOTE — Progress Notes (Signed)
 Established Patient Office Visit  Subjective:  Patient ID: Melissa Pruitt, female    DOB: 15-Jun-1975  Age: 48 y.o. MRN: [id number]  Chief Complaint  Patient presents with   Follow-up    4 Months Follow Up    Patient in office for 4 month follow up. Discuss recent lab results. TSH abnormal, will recheck in 4 weeks. Patient has a history of vitamin B12 deficiency, will check at next blood draw.  Patient requesting blood work for STD testing, recent exposure. Will check today.  Patient recently had a tooth removed, feels she is having complications, requesting a referral to a facial surgeon, referral placed.  Patient has a history of alpha gal, requesting a referral to a specialist at [facility], referral placed.  Patient concerned she may have IBS, recommend following up with GI at the [facility] clinic.     No other concerns at this time.   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 Larey Dresser   Dysplastic nevus 01/26/2023   Right lateral anterior sole. Moderate atypia, close to margin.   Dysplastic nevus 01/26/2023   Right lateral sole posterior. Moderate to severe atypia, close to margin.   History of recurrent UTIs    HPV (human  papilloma virus) infection    HSV (herpes simplex virus) infection    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[facility])    sacro/coccygeal mass excision   Melanoma (HCC) 02/16/2020   R mid plantar sole, BRESLOW'S DEPTH/MAXIMUM TUMOR THICKNESS: 0.5 MM, CLARK/ANATOMIC LEVEL: II Excised 03/20/2020   Normocytic anemia 08/21/2014   Tethered spinal cord (HCC)    Vitamin B 12 deficiency 08/21/2014    Past Surgical History:  Procedure Laterality Date   BACK SURGERY     BLADDER SURGERY     COLOSTOMY     after flex sig as 48 year old.    colostomy reversal     EXPLORATORY LAPAROTOMY     with colostomy placement   LEEP     removal of cervical cancer     TUBAL LIGATION      Social History   Socioeconomic History   Marital status: Divorced    Spouse name: Not on file   Number of children: 3   Years of education: Not on file   Highest education level: Not on file  Occupational History   Occupation: disability    Employer: OTHER  Tobacco Use   Smoking status: Former    Current packs/day: 0.00    Types: Cigarettes    Quit date: 07/14/2011    Years since quitting: 11.9   Smokeless  tobacco: Never   Tobacco comments:    1-3 cigarettes  Vaping Use   Vaping status: Never Used  Substance and Sexual Activity   Alcohol use: Yes    Comment: twice a year   Drug use: No   Sexual activity: Yes    Birth control/protection: Pill, Condom  Other Topics Concern   Not on file  Social History Narrative   Not on file   Social Drivers of Health   Financial Resource Strain: Not on file  Food Insecurity: No Food Insecurity (06/12/2022)   Hunger Vital Sign    Worried About Running Out of Food in the Last Year: Never true    Ran Out of Food in the Last Year: Never true  Transportation Needs: No Transportation Needs (06/12/2022)   PRAPARE - Administrator, Civil Service (Medical): No    Lack of Transportation (Non-Medical): No  Physical Activity: Not on file  Stress: Not on file  Social  Connections: Unknown (08/27/2021)   Received from [facility]   Social Network    Social Network: Not on file  Intimate Partner Violence: Unknown (07/19/2021)   Received from [facility]   HITS    Physically Hurt: Not on file    Insult or Talk Down To: Not on file    Threaten Physical Harm: Not on file    Scream or Curse: Not on file    Family History  Problem Relation Age of Onset   Hypertension Mother    Atrial fibrillation Mother    Cervical cancer Maternal Grandmother    Colon cancer Neg Hx    Breast cancer Neg Hx     Allergies  Allergen Reactions   Ciprofloxacin Anaphylaxis    Heart and nerve issues   Nitrofurantoin Shortness Of Breath   Sulfa Antibiotics Anaphylaxis    Patient reporting sulfa drugs cause anaphylaxis, difficulty breathing and hives, as well as nausea vomiting   Azithromycin Nausea And Vomiting   Beef (Bovine) Protein    Lactose     Other reaction(s): Other (See Comments) Digestive issues/pain   Prednisone Other (See Comments)   Rosuvastatin Other (See Comments)   Tramadol Hcl     REACTION: HEART PALPITATIONS /SWELLING /SOB   Vitamin D Analogs     Severe Diarhhea   Lidocaine Palpitations   Pork-Derived Products Nausea And Vomiting    Outpatient Medications Prior to Visit  Medication Sig   clobetasol cream (TEMOVATE) 0.05 % Apply twice daily to bites until resolved. Avoid applying to face, groin, and axilla.   conjugated estrogens (PREMARIN) vaginal cream Place 1 Applicatorful vaginally 2 (two) times a week. Place 0.5g nightly for two weeks then twice a week after   Cyanocobalamin (VITAMIN B-12) 1000 MCG SUBL Place under the tongue as needed.   EPINEPHrine 0.3 mg/0.3 mL IJ SOAJ injection Inject 0.3 mg as directed See admin instructions.   estradiol (ESTRACE) 1 MG tablet Take 1 tablet (1 mg total) by mouth daily. Take for 2 weeks each month as needed for breakthrough bleeding only   fluticasone (FLONASE) 50 MCG/ACT  nasal spray Place 2 sprays into the nose daily.   hydrocortisone 2.5 % cream Apply topically at bedtime. Apply to under left arm on Monday, Wednesday and Friday.   loratadine (CLARITIN) 10 MG tablet Take 10 mg by mouth daily.   methocarbamol (ROBAXIN) 750 MG tablet Take 750 mg by mouth 3 (three) times daily.   mometasone (ELOCON) 0.1 % ointment 1 application  Externally sparingly twice a day for 30 days   norethindrone (MICRONOR) 0.35 MG tablet TAKE 1 TABLET BY MOUTH EVERY DAY   promethazine (PHENERGAN) 12.5 MG tablet Take 1 tablet (12.5 mg total) by mouth every 8 (eight) hours as needed for nausea or vomiting.   [DISCONTINUED] clindamycin-benzoyl peroxide (BENZACLIN) gel Apply 1 Application topically 2 (two) times daily.   [DISCONTINUED] amoxicillin-clavulanate (AUGMENTIN) 875-125 MG tablet Take 1 tablet by mouth 2 (two) times daily.   Facility-Administered Medications Prior to Visit  Medication Dose Route Frequency Provider   lidocaine (XYLOCAINE) 2 % jelly 1 Application  1 Application Urethral Once     Review of Systems  Constitutional: Negative.   HENT: Negative.    Eyes: Negative.   Respiratory: Negative.  Negative for shortness of breath.   Cardiovascular: Negative.  Negative for chest pain.  Gastrointestinal: Negative.  Negative for abdominal pain, constipation and diarrhea.  Genitourinary: Negative.   Musculoskeletal:  Negative for joint pain and myalgias.  Skin: Negative.   Neurological: Negative.  Negative for dizziness and headaches.  Endo/Heme/Allergies: Negative.   All other systems reviewed and are negative.      Objective:   BP 122/60   Pulse 74   Ht 5\' 4"  (1.626 m)   Wt 121 lb (54.9 kg)   SpO2 98%   BMI 20.77 kg/m   Vitals:   06/12/23 1308  BP: 122/60  Pulse: 74  Height: 5\' 4"  (1.626 m)  Weight: 121 lb (54.9 kg)  SpO2: 98%  BMI (Calculated): 20.76    Physical Exam Vitals and nursing note reviewed.  Constitutional:      Appearance: Normal  appearance. She is normal weight.  HENT:     Head: Normocephalic and atraumatic.     Nose: Nose normal.     Mouth/Throat:     Mouth: Mucous membranes are moist.  Eyes:     Extraocular Movements: Extraocular movements intact.     Conjunctiva/sclera: Conjunctivae normal.     Pupils: Pupils are equal, round, and reactive to light.  Cardiovascular:     Rate and Rhythm: Normal rate and regular rhythm.     Pulses: Normal pulses.     Heart sounds: Normal heart sounds.  Pulmonary:     Effort: Pulmonary effort is normal.     Breath sounds: Normal breath sounds.  Abdominal:     General: Abdomen is flat. Bowel sounds are normal.     Palpations: Abdomen is soft.  Musculoskeletal:        General: Normal range of motion.     Cervical back: Normal range of motion.  Skin:    General: Skin is warm and dry.  Neurological:     General: No focal deficit present.     Mental Status: She is alert and oriented to person, place, and time.  Psychiatric:        Mood and Affect: Mood normal.        Behavior: Behavior normal.        Thought Content: Thought content normal.        Judgment: Judgment normal.      No results found for any visits on 06/12/23.  Recent Results (from the past 2160 hours)  POCT Urinalysis Dipstick     Status: Abnormal   Collection Time: 04/14/23 11:40 AM  Result Value Ref Range   Color, UA yellow    Clarity, UA clear    Glucose, UA Negative Negative   Bilirubin, UA negative    Ketones, UA  negative    Spec Grav, UA 1.020 1.010 - 1.025   Blood, UA small    pH, UA 6.0 5.0 - 8.0   Protein, UA Positive (A) Negative    Comment: 30mg /dl   Urobilinogen, UA 1.0 0.2 or 1.0 E.U./dL   Nitrite, UA positive    Leukocytes, UA Large (3+) (A) Negative   Appearance     Odor    Hemoglobin A1c     Status: None   Collection Time: 06/01/23  8:08 AM  Result Value Ref Range   Hgb A1c MFr Bld 5.1 4.8 - 5.6 %    Comment:          Prediabetes: 5.7 - 6.4          Diabetes: >6.4           Glycemic control for adults with diabetes: <7.0    Est. average glucose Bld gHb Est-mCnc 100 mg/dL  TSH     Status: Abnormal   Collection Time: 06/01/23  8:08 AM  Result Value Ref Range   TSH 4.810 (H) 0.450 - 4.500 uIU/mL  CMP14+EGFR     Status: Abnormal   Collection Time: 06/01/23  8:08 AM  Result Value Ref Range   Glucose 87 70 - 99 mg/dL   BUN 11 6 - 24 mg/dL   Creatinine, Ser 1.61 0.57 - 1.00 mg/dL   eGFR 096 >04 VW/UJW/1.19   BUN/Creatinine Ratio 17 9 - 23   Sodium 140 134 - 144 mmol/L   Potassium 4.6 3.5 - 5.2 mmol/L   Chloride 104 96 - 106 mmol/L   CO2 25 20 - 29 mmol/L   Calcium 9.1 8.7 - 10.2 mg/dL   Total Protein 6.0 6.0 - 8.5 g/dL   Albumin 4.2 3.9 - 4.9 g/dL   Globulin, Total 1.8 1.5 - 4.5 g/dL   Bilirubin Total 0.6 0.0 - 1.2 mg/dL   Alkaline Phosphatase 34 (L) 44 - 121 IU/L   AST 13 0 - 40 IU/L   ALT 11 0 - 32 IU/L  Lipid panel     Status: Abnormal   Collection Time: 06/01/23  8:08 AM  Result Value Ref Range   Cholesterol, Total 196 100 - 199 mg/dL   Triglycerides 147 0 - 149 mg/dL   HDL 69 >82 mg/dL   VLDL Cholesterol Cal 19 5 - 40 mg/dL   LDL Chol Calc (NIH) 956 (H) 0 - 99 mg/dL   Chol/HDL Ratio 2.8 0.0 - 4.4 ratio    Comment:                                   T. Chol/HDL Ratio                                             Men  Women                               1/2 Avg.Risk  3.4    3.3                                   Avg.Risk  5.0    4.4  2X Avg.Risk  9.6    7.1                                3X Avg.Risk 23.4   11.0       Assessment & Plan:  Repeat TSH and B12 in 4 weeks.  STD blood work today. Referral to oral maxillofacial surgeon placed.  Referral to [facility] specialist placed.  Follow up with GI.  Problem List Items Addressed This Visit       Other   Vitamin B 12 deficiency   Relevant Orders   Vitamin B12   Hyperlipidemia - Primary   Relevant Orders   TSH   Other Visit Diagnoses       Allergy to  alpha-gal       Relevant Orders   Ambulatory referral to Allergy     Pain of tooth socket       Relevant Orders   Ambulatory referral to Oral Maxillofacial Surgery     STD exposure       Relevant Orders   RPR   HIV antibody (with reflex)   Chlamydia/Gonococcus/Trichomonas, NAA   Chlamydia Antibodies, IgG       Return in about 4 months (around 10/10/2023) for fasting labs prior.   Total time spent: 25 minutes  [employer], NP  06/12/2023   This document may have been prepared by Dragon Voice Recognition software and as such may include unintentional dictation errors.

## 2023-06-14 LAB — HIV ANTIBODY (ROUTINE TESTING W REFLEX): HIV Screen 4th Generation wRfx: NONREACTIVE

## 2023-06-14 LAB — RPR: RPR Ser Ql: NONREACTIVE

## 2023-06-15 NOTE — Progress Notes (Signed)
 Informed via Mychart message

## 2023-06-17 LAB — CHLAMYDIA ANTIBODIES, IGG: Chlamydia Antibodies, IgG: 1.86 ratio — ABNORMAL HIGH (ref 0.00–0.90)

## 2023-06-18 ENCOUNTER — Other Ambulatory Visit: Payer: Self-pay | Admitting: Cardiology

## 2023-06-18 MED ORDER — DOXYCYCLINE HYCLATE 100 MG PO TABS: 100.0000 mg | ORAL_TABLET | Freq: Two times a day (BID) | ORAL | 0 refills | Status: AC

## 2023-06-18 NOTE — Progress Notes (Signed)
 Informed via Mychart message

## 2023-06-30 ENCOUNTER — Other Ambulatory Visit: Payer: Self-pay | Admitting: Cardiology

## 2023-06-30 DIAGNOSIS — Z202 Contact with and (suspected) exposure to infections with a predominantly sexual mode of transmission: Secondary | ICD-10-CM

## 2023-07-01 ENCOUNTER — Ambulatory Visit

## 2023-07-01 ENCOUNTER — Other Ambulatory Visit

## 2023-07-01 DIAGNOSIS — E782 Mixed hyperlipidemia: Secondary | ICD-10-CM

## 2023-07-01 DIAGNOSIS — E538 Deficiency of other specified B group vitamins: Secondary | ICD-10-CM

## 2023-07-02 LAB — VITAMIN B12: Vitamin B-12: 313 pg/mL (ref 232–1245)

## 2023-07-02 LAB — TSH: TSH: 2.97 u[IU]/mL (ref 0.450–4.500)

## 2023-07-02 NOTE — Progress Notes (Signed)
 Informed via Mychart message

## 2023-07-04 LAB — NUSWAB VG+, HSV
Candida albicans, NAA: NEGATIVE
Candida glabrata, NAA: NEGATIVE
Chlamydia trachomatis, NAA: NEGATIVE
HSV 1 NAA: NEGATIVE
HSV 2 NAA: NEGATIVE
Neisseria gonorrhoeae, NAA: NEGATIVE
Trich vag by NAA: NEGATIVE

## 2023-07-06 NOTE — Progress Notes (Signed)
 Informed via Mychart message

## 2023-08-05 ENCOUNTER — Ambulatory Visit: Admitting: Physical Therapy

## 2023-08-05 ENCOUNTER — Encounter: Payer: Self-pay | Admitting: Physical Therapy

## 2023-08-05 ENCOUNTER — Other Ambulatory Visit: Payer: Self-pay

## 2023-08-05 DIAGNOSIS — R2681 Unsteadiness on feet: Secondary | ICD-10-CM

## 2023-08-05 DIAGNOSIS — R293 Abnormal posture: Secondary | ICD-10-CM

## 2023-08-05 DIAGNOSIS — M6281 Muscle weakness (generalized): Secondary | ICD-10-CM

## 2023-08-05 NOTE — Therapy (Signed)
 [facility] [address] [place], [place], [address] Phone: [phone]   Fax:  [phone]  Patient Details  Name: Melissa Pruitt MRN: [id number] Date of Birth: [birth year] Referring Provider:  Rosan Comfort, MD  Encounter Date: 08/05/2023  Session is arrive no charge. 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 Physical therapists at this location do not specialize in TMJ therapy so recommend referral out to a different location. Patient did find a second clinic that is able to take insurance. PT encouraged patient to follow up with this location and explained will need new referral.  Coreen Devoid, PT, DPT 08/05/2023, 10:31 AM  [facility] [address] [place], [place], [address] Phone: [phone]   Fax:  [phone]

## 2023-08-20 ENCOUNTER — Telehealth: Payer: Self-pay | Admitting: Obstetrics and Gynecology

## 2023-08-20 NOTE — Telephone Encounter (Signed)
 Pt sent in request for appointment. Please advise.  Appointment Request From: Melissa Pruitt   With Provider: Kaitlin G Zuleta [[facility]]   Preferred Date Range: 08/21/2023 - 08/27/2023   Preferred Times: Any Time   Reason for visit: Office Visit   Health Maintenance Topic:    Comments: Need urine culture and full panel std test please asap. I am experience familiar symptoms like the e.coli symptoms again. cloudy, smelly urine with pain and nausea

## 2023-08-21 ENCOUNTER — Other Ambulatory Visit: Payer: Self-pay | Admitting: Cardiology

## 2023-08-21 ENCOUNTER — Telehealth: Payer: Self-pay

## 2023-08-21 ENCOUNTER — Ambulatory Visit (INDEPENDENT_AMBULATORY_CARE_PROVIDER_SITE_OTHER)

## 2023-08-21 DIAGNOSIS — R82998 Other abnormal findings in urine: Secondary | ICD-10-CM

## 2023-08-21 DIAGNOSIS — Z202 Contact with and (suspected) exposure to infections with a predominantly sexual mode of transmission: Secondary | ICD-10-CM

## 2023-08-21 DIAGNOSIS — R300 Dysuria: Secondary | ICD-10-CM

## 2023-08-21 DIAGNOSIS — R350 Frequency of micturition: Secondary | ICD-10-CM

## 2023-08-21 LAB — POCT URINALYSIS DIPSTICK
Bilirubin, UA: NEGATIVE
Glucose, UA: NEGATIVE
Ketones, UA: POSITIVE
Nitrite, UA: POSITIVE
Protein, UA: POSITIVE — AB
Spec Grav, UA: 1.015 (ref 1.010–1.025)
Urobilinogen, UA: 0.2 U/dL
pH, UA: 6.5 (ref 5.0–8.0)

## 2023-08-21 NOTE — Patient Instructions (Signed)
 Your Urine dip that was done in office was Positive. I am sending the urine off for pathnostics culture and you can take AZO over the counter for your discomfort.  We will contact you when the results are back between 3-5 days.  If an antibiotic is needed we will sent the order to the pharmacy and you will be notified. If you have any questions or concerns please feel free to call us  at [phone]

## 2023-08-21 NOTE — Telephone Encounter (Signed)
 Patient is asking for a full STD panel lab order to be placed please, she would like to get it done today if possible

## 2023-08-21 NOTE — Progress Notes (Signed)
 Melissa Pruitt is a 48 y.o. female  arrived today with UTI sx.  Per Dr. Sunnie England protocol: A urine specimen was collected and POCT Urine was done and urine culture sent to the lab. POCT Urine was POSITIVE for leukocytes and Nitrates.

## 2023-08-24 ENCOUNTER — Other Ambulatory Visit: Payer: Self-pay | Admitting: Obstetrics and Gynecology

## 2023-08-24 ENCOUNTER — Encounter: Payer: Self-pay | Admitting: Obstetrics and Gynecology

## 2023-08-24 DIAGNOSIS — N390 Urinary tract infection, site not specified: Secondary | ICD-10-CM

## 2023-08-24 MED ORDER — AMOXICILLIN-POT CLAVULANATE 875-125 MG PO TABS: 1.0000 | ORAL_TABLET | Freq: Two times a day (BID) | ORAL | 0 refills | Status: DC

## 2023-08-24 NOTE — Progress Notes (Signed)
 Augmentin  sent in for acute UTI based on Pathnostics culture result

## 2023-08-26 ENCOUNTER — Ambulatory Visit: Payer: Self-pay | Admitting: Cardiology

## 2023-08-26 LAB — HIV ANTIBODY (ROUTINE TESTING W REFLEX): HIV Screen 4th Generation wRfx: NONREACTIVE

## 2023-08-26 LAB — HSV 1 AND 2 AB, IGG
HSV 1 Glycoprotein G Ab, IgG: NONREACTIVE
HSV 2 IgG, Type Spec: REACTIVE — AB

## 2023-08-26 LAB — RPR: RPR Ser Ql: NONREACTIVE

## 2023-08-27 LAB — GC/CHLAMYDIA PROBE AMP
Chlamydia trachomatis, NAA: NEGATIVE
Neisseria Gonorrhoeae by PCR: NEGATIVE

## 2023-09-10 ENCOUNTER — Ambulatory Visit (INDEPENDENT_AMBULATORY_CARE_PROVIDER_SITE_OTHER)

## 2023-09-10 VITALS — BP 117/64 | HR 76

## 2023-09-10 DIAGNOSIS — N390 Urinary tract infection, site not specified: Secondary | ICD-10-CM | POA: Diagnosis not present

## 2023-09-10 DIAGNOSIS — R300 Dysuria: Secondary | ICD-10-CM

## 2023-09-10 LAB — POCT URINALYSIS DIP (CLINITEK)
Bilirubin, UA: NEGATIVE
Glucose, UA: NEGATIVE mg/dL
Ketones, POC UA: NEGATIVE mg/dL
Nitrite, UA: POSITIVE — AB
POC PROTEIN,UA: NEGATIVE
Spec Grav, UA: 1.015 (ref 1.010–1.025)
Urobilinogen, UA: 0.2 U/dL
pH, UA: 6.5 (ref 5.0–8.0)

## 2023-09-10 MED ORDER — TRIMETHOPRIM 100 MG PO TABS: 100.0000 mg | ORAL_TABLET | Freq: Two times a day (BID) | ORAL | 0 refills | Status: AC

## 2023-09-10 NOTE — Patient Instructions (Signed)
 Your Urine dip that was done in office was Positive. I am sending the urine off for culture and you can take AZO over the counter for your discomfort.  We have ordered trimethoprim  for you to take while we wait for your culture results, hopefully this gives you some relief. We will contact you when the results are back between 3-5 days.  If a different antibiotic is needed we will sent the order to the pharmacy and you will be notified. If you have any questions or concerns please feel free to call us  at [phone]

## 2023-09-10 NOTE — Progress Notes (Signed)
 Melissa Pruitt is a 48 y.o. female  arrived today with UTI sx.  Per Dr. Sunnie England protocol: A urine specimen was collected and POCT Urine was done and urine culture sent to the lab. POCT Urine was POSITIVE for Nitrates and Leukocytes Pt was notified and prescription sent to the preferred pharmacy.

## 2023-09-10 NOTE — Progress Notes (Signed)
 Nurse visit Denies back pain, fever, chills, N/V.  Pending urine culture and pathnostics  Today's Vitals   09/10/23 1255  BP: 117/64  Pulse: 76   There is no height or weight on file to calculate BMI.  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  Patient reports prior Keflex  use without relief.  Pansensitive E. Coli noted on prior pathnostics.  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

## 2023-09-14 ENCOUNTER — Other Ambulatory Visit: Payer: Self-pay | Admitting: Obstetrics and Gynecology

## 2023-09-14 ENCOUNTER — Ambulatory Visit: Payer: Self-pay | Admitting: Obstetrics and Gynecology

## 2023-09-14 DIAGNOSIS — N390 Urinary tract infection, site not specified: Secondary | ICD-10-CM

## 2023-09-14 MED ORDER — AMOXICILLIN-POT CLAVULANATE 875-125 MG PO TABS: 1.0000 | ORAL_TABLET | Freq: Two times a day (BID) | ORAL | 0 refills | Status: DC

## 2023-09-15 ENCOUNTER — Other Ambulatory Visit: Payer: Self-pay | Admitting: Obstetrics and Gynecology

## 2023-09-15 MED ORDER — FOSFOMYCIN TROMETHAMINE 3 G PO PACK: 3.0000 g | PACK | Freq: Once | ORAL | 0 refills | Status: AC

## 2023-09-16 ENCOUNTER — Encounter: Payer: Self-pay | Admitting: Dermatology

## 2023-09-16 ENCOUNTER — Ambulatory Visit: Payer: Medicaid Other | Admitting: Dermatology

## 2023-09-16 DIAGNOSIS — Z85820 Personal history of malignant melanoma of skin: Secondary | ICD-10-CM

## 2023-09-16 DIAGNOSIS — Z1283 Encounter for screening for malignant neoplasm of skin: Secondary | ICD-10-CM | POA: Diagnosis not present

## 2023-09-16 DIAGNOSIS — L814 Other melanin hyperpigmentation: Secondary | ICD-10-CM

## 2023-09-16 DIAGNOSIS — Z7189 Other specified counseling: Secondary | ICD-10-CM

## 2023-09-16 DIAGNOSIS — L821 Other seborrheic keratosis: Secondary | ICD-10-CM

## 2023-09-16 DIAGNOSIS — L578 Other skin changes due to chronic exposure to nonionizing radiation: Secondary | ICD-10-CM | POA: Diagnosis not present

## 2023-09-16 DIAGNOSIS — D1801 Hemangioma of skin and subcutaneous tissue: Secondary | ICD-10-CM

## 2023-09-16 DIAGNOSIS — W908XXA Exposure to other nonionizing radiation, initial encounter: Secondary | ICD-10-CM

## 2023-09-16 DIAGNOSIS — Z86018 Personal history of other benign neoplasm: Secondary | ICD-10-CM

## 2023-09-16 DIAGNOSIS — D229 Melanocytic nevi, unspecified: Secondary | ICD-10-CM

## 2023-09-16 DIAGNOSIS — D239 Other benign neoplasm of skin, unspecified: Secondary | ICD-10-CM

## 2023-09-16 NOTE — Progress Notes (Signed)
 Follow-Up Visit   Subjective  Melissa Pruitt is a 48 y.o. female who presents for the following: Skin Cancer Screening and Full Body Skin Exam  The patient presents for Total-Body Skin Exam (TBSE) for skin cancer screening and mole check. The patient has spots, moles and lesions to be evaluated, some may be new or changing and the patient may have concern these could be cancer.  The following portions of the chart were reviewed this encounter and updated as appropriate: medications, allergies, medical history  Review of Systems:  No other skin or systemic complaints except as noted in HPI or Assessment and Plan.  Objective  Well appearing patient in no apparent distress; mood and affect are within normal limits.  A full examination was performed including scalp, head, eyes, ears, nose, lips, neck, chest, axillae, abdomen, back, buttocks, bilateral upper extremities, bilateral lower extremities, hands, feet, fingers, toes, fingernails, and toenails. All findings within normal limits unless otherwise noted below.   Relevant physical exam findings are noted in the Assessment and Plan.   Assessment & Plan   SKIN CANCER SCREENING PERFORMED TODAY.  ACTINIC DAMAGE - Chronic condition, secondary to cumulative UV/sun exposure - diffuse scaly erythematous macules with underlying dyspigmentation - Recommend daily broad spectrum sunscreen SPF 30+ to sun-exposed areas, reapply every 2 hours as needed.  - Staying in the shade or wearing long sleeves, sun glasses (UVA+UVB protection) and wide brim hats (4-inch brim around the entire circumference of the hat) are also recommended for sun protection.  - Call for new or changing lesions.  LENTIGINES, SEBORRHEIC KERATOSES, HEMANGIOMAS - Benign normal skin lesions - Benign-appearing - Call for any changes  MELANOCYTIC NEVI - Tan-brown and/or pink-flesh-colored symmetric macules and papules - Benign appearing on exam today - Observation - Call  clinic for new or changing moles - Recommend daily use of broad spectrum spf 30+ sunscreen to sun-exposed areas.   HISTORY OF DYSPLASTIC NEVI No evidence of recurrence today Recommend regular full body skin exams Recommend daily broad spectrum sunscreen SPF 30+ to sun-exposed areas, reapply every 2 hours as needed.  Call if any new or changing lesions are noted between office visits  HISTORY OF MELANOMA - R mid plantar sole, excised 03/20/2020 - No evidence of recurrence today - No lymphadenopathy - Recommend regular full body skin exams - Recommend daily broad spectrum sunscreen SPF 30+ to sun-exposed areas, reapply every 2 hours as needed.  - Call if any new or changing lesions are noted between office visits  HISTORY OF DYSPLASTIC NEVUS - moderate to severe, margins free - R foot, latreral sole posterior Discussed observation (since margins free) vs excision, due to hx of MM, patient prefers excision. Refer to Dr Fain Home for excision. See photos No evidence of recurrence today Recommend regular full body skin exams Recommend daily broad spectrum sunscreen SPF 30+ to sun-exposed areas, reapply every 2 hours as needed.  Call if any new or changing lesions are noted between office visits      Moderate Dysplastic Nevus Right foot lateral sole anterior Clear today No treatment recommended unless recurrence Re-check next visit   DYSPLASTIC NEVUS   Related Procedures Ambulatory referral to Dermatology  Return in about 6 months (around 03/17/2024) for TBSE - hx dysplastic nevi, MM.  I, Mara Seminole, CMA, am acting as scribe for Celine Collard, MD .   Documentation: I have reviewed the above documentation for accuracy and completeness, and I agree with the above.  Celine Collard, MD

## 2023-09-16 NOTE — Patient Instructions (Signed)

## 2023-10-06 ENCOUNTER — Encounter: Payer: Self-pay | Admitting: Cardiology

## 2023-10-06 ENCOUNTER — Other Ambulatory Visit: Payer: Self-pay | Admitting: Cardiology

## 2023-10-06 DIAGNOSIS — E782 Mixed hyperlipidemia: Secondary | ICD-10-CM

## 2023-10-06 DIAGNOSIS — Z1329 Encounter for screening for other suspected endocrine disorder: Secondary | ICD-10-CM

## 2023-10-06 DIAGNOSIS — Z131 Encounter for screening for diabetes mellitus: Secondary | ICD-10-CM

## 2023-10-06 DIAGNOSIS — I10 Essential (primary) hypertension: Secondary | ICD-10-CM

## 2023-10-07 ENCOUNTER — Other Ambulatory Visit: Payer: Self-pay

## 2023-10-07 DIAGNOSIS — Z1329 Encounter for screening for other suspected endocrine disorder: Secondary | ICD-10-CM

## 2023-10-07 DIAGNOSIS — E782 Mixed hyperlipidemia: Secondary | ICD-10-CM

## 2023-10-07 DIAGNOSIS — Z131 Encounter for screening for diabetes mellitus: Secondary | ICD-10-CM

## 2023-10-07 DIAGNOSIS — I10 Essential (primary) hypertension: Secondary | ICD-10-CM

## 2023-10-10 LAB — CMP14+EGFR
ALT: 11 IU/L (ref 0–32)
AST: 15 IU/L (ref 0–40)
Albumin: 4.6 g/dL (ref 3.9–4.9)
Alkaline Phosphatase: 39 IU/L — ABNORMAL LOW (ref 44–121)
BUN/Creatinine Ratio: 15 (ref 9–23)
BUN: 9 mg/dL (ref 6–24)
Bilirubin Total: 0.8 mg/dL (ref 0.0–1.2)
CO2: 23 mmol/L (ref 20–29)
Calcium: 9.6 mg/dL (ref 8.7–10.2)
Chloride: 105 mmol/L (ref 96–106)
Creatinine, Ser: 0.6 mg/dL (ref 0.57–1.00)
Globulin, Total: 1.8 g/dL (ref 1.5–4.5)
Glucose: 86 mg/dL (ref 70–99)
Potassium: 4.7 mmol/L (ref 3.5–5.2)
Sodium: 142 mmol/L (ref 134–144)
Total Protein: 6.4 g/dL (ref 6.0–8.5)
eGFR: 110 mL/min/{1.73_m2} (ref >59)

## 2023-10-10 LAB — LIPID PANEL
Chol/HDL Ratio: 3.1 ratio (ref 0.0–4.4)
Cholesterol, Total: 179 mg/dL (ref 100–199)
HDL: 57 mg/dL (ref >39)
LDL Chol Calc (NIH): 108 mg/dL — ABNORMAL HIGH (ref 0–99)
Triglycerides: 77 mg/dL (ref 0–149)
VLDL Cholesterol Cal: 14 mg/dL (ref 5–40)

## 2023-10-10 LAB — HEMOGLOBIN A1C
Est. average glucose Bld gHb Est-mCnc: 103 mg/dL
Hgb A1c MFr Bld: 5.2 % (ref 4.8–5.6)

## 2023-10-10 LAB — TSH: TSH: 2.35 u[IU]/mL (ref 0.450–4.500)

## 2023-10-12 ENCOUNTER — Ambulatory Visit: Payer: Self-pay | Admitting: Cardiology

## 2023-10-13 ENCOUNTER — Ambulatory Visit: Payer: Medicaid Other | Admitting: Cardiology

## 2023-10-13 ENCOUNTER — Encounter: Payer: Self-pay | Admitting: Cardiology

## 2023-10-13 VITALS — BP 115/60 | HR 91 | Ht 64.0 in | Wt 124.4 lb

## 2023-10-13 DIAGNOSIS — E782 Mixed hyperlipidemia: Secondary | ICD-10-CM | POA: Diagnosis not present

## 2023-10-13 DIAGNOSIS — Z0130 Encounter for examination of blood pressure without abnormal findings: Secondary | ICD-10-CM

## 2023-10-13 NOTE — Progress Notes (Signed)
 Established Patient Office Visit  Subjective:  Patient ID: Melissa Pruitt, female    DOB: 28-Nov-1975  Age: 48 y.o. MRN: [id number]  Chief Complaint  Patient presents with   Follow-up    4 month lab results    Patient in office for 4 month follow up, discuss recent lab results. Patient doing well, no complaints today. Cologuard 01/2023 negative.  Discussed recent lab work. All labs stable.  Continue same medications.     No other concerns at this time.   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 Glendale Many   Dysplastic nevus 01/26/2023   Right lateral anterior sole. Moderate atypia, close to margin.   Dysplastic nevus 01/26/2023   Right lateral sole posterior. Moderate to severe atypia, close to margin.   History of recurrent UTIs    HPV (human papilloma virus) infection    HSV (herpes simplex virus) infection    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[facility])    sacro/coccygeal mass excision   Melanoma (HCC) 02/16/2020   R mid plantar sole, BRESLOW'S DEPTH/MAXIMUM TUMOR THICKNESS: 0.5 MM, CLARK/ANATOMIC LEVEL: II Excised 03/20/2020   Normocytic anemia 08/21/2014   Tethered spinal cord (HCC)    Vitamin B 12 deficiency  08/21/2014    Past Surgical History:  Procedure Laterality Date   BACK SURGERY     BLADDER SURGERY     COLOSTOMY     after flex sig as 48 year old.    colostomy reversal     EXPLORATORY LAPAROTOMY     with colostomy placement   LEEP     removal of cervical cancer     TUBAL LIGATION      Social History   Socioeconomic History   Marital status: Divorced    Spouse name: Not on file   Number of children: 3   Years of education: Not on file   Highest education level: Not on file  Occupational History   Occupation: disability    Employer: OTHER  Tobacco Use   Smoking status: Former    Current packs/day: 0.00    Types: Cigarettes    Quit date: 07/14/2011    Years since quitting: 12.2   Smokeless tobacco: Never   Tobacco comments:    1-3 cigarettes  Vaping Use   Vaping status: Never Used  Substance and Sexual Activity   Alcohol use: Yes    Comment: twice a year   Drug use: No   Sexual activity: Yes    Birth control/protection: Pill, Condom  Other Topics Concern   Not on file  Social History Narrative  Not on file   Social Drivers of Health   Financial Resource Strain: Not on file  Food Insecurity: No Food Insecurity (06/12/2022)   Hunger Vital Sign    Worried About Running Out of Food in the Last Year: Never true    Ran Out of Food in the Last Year: Never true  Transportation Needs: No Transportation Needs (06/12/2022)   PRAPARE - Administrator, Civil Service (Medical): No    Lack of Transportation (Non-Medical): No  Physical Activity: Not on file  Stress: Not on file  Social Connections: Unknown (08/27/2021)   Received [facility]   Social Network    Social Network: Not on file  Intimate Partner Violence: Unknown (07/19/2021)   Received from [facility]   HITS    Physically Hurt: Not on file    Insult or Talk Down To: Not on file    Threaten Physical Harm: Not on file    Scream or Curse: Not on file    Family History  Problem  Relation Age of Onset   Hypertension Mother    Atrial fibrillation Mother    Cervical cancer Maternal Grandmother    Colon cancer Neg Hx    Breast cancer Neg Hx     Allergies  Allergen Reactions   Ciprofloxacin  Anaphylaxis    Heart and nerve issues   Nitrofurantoin  Shortness Of Breath   Sulfa Antibiotics Anaphylaxis    Patient reporting sulfa drugs cause anaphylaxis, difficulty breathing and hives, as well as nausea vomiting   Azithromycin Nausea And Vomiting   Beef (Bovine) Protein    Lactose     Other reaction(s): Other (See Comments) Digestive issues/pain   Prednisone  Other (See Comments)   Rosuvastatin Other (See Comments)   Tramadol Hcl     REACTION: HEART PALPITATIONS /SWELLING /SOB   Vitamin D Analogs     Severe Diarhhea   Lidocaine  Palpitations   Pork-Derived Products Nausea And Vomiting    Outpatient Medications Prior to Visit  Medication Sig   Clindamycin -Benzoyl Per, Refr, gel Apply 1 Application topically 2 (two) times daily.   clobetasol  cream (TEMOVATE ) 0.05 % Apply twice daily to bites until resolved. Avoid applying to face, groin, and axilla.   conjugated estrogens  (PREMARIN ) vaginal cream Place 1 Applicatorful vaginally 2 (two) times a week. Place 0.5g nightly for two weeks then twice a week after   Cyanocobalamin  (VITAMIN B-12) 1000 MCG SUBL Place under the tongue as needed.   EPINEPHrine 0.3 mg/0.3 mL IJ SOAJ injection Inject 0.3 mg as directed See admin instructions.   estradiol  (ESTRACE ) 1 MG tablet Take 1 tablet (1 mg total) by mouth daily. Take for 2 weeks each month as needed for breakthrough bleeding only   fluticasone (FLONASE) 50 MCG/ACT nasal spray Place 2 sprays into the nose daily.   hydrocortisone  2.5 % cream Apply topically at bedtime. Apply to under left arm on Monday, Wednesday and Friday.   loratadine (CLARITIN) 10 MG tablet Take 10 mg by mouth daily.   methocarbamol (ROBAXIN) 750 MG tablet Take 750 mg by mouth 3 (three) times daily.    mometasone (ELOCON) 0.1 % ointment 1 application Externally sparingly twice a day for 30 days   norethindrone  (MICRONOR ) 0.35 MG tablet TAKE 1 TABLET BY MOUTH EVERY DAY   promethazine  (PHENERGAN ) 12.5 MG tablet Take 1 tablet (12.5 mg total) by mouth every 8 (eight) hours as needed for nausea or vomiting.   [DISCONTINUED] amoxicillin -clavulanate (AUGMENTIN ) 875-125 MG tablet Take 1 tablet by  mouth 2 (two) times daily. (Patient not taking: Reported on 10/13/2023)   Facility-Administered Medications Prior to Visit  Medication Dose Route Frequency Provider   lidocaine  (XYLOCAINE ) 2 % jelly 1 Application  1 Application Urethral Once     Review of Systems  Constitutional: Negative.   HENT: Negative.    Eyes: Negative.   Respiratory: Negative.  Negative for shortness of breath.   Cardiovascular: Negative.  Negative for chest pain.  Gastrointestinal: Negative.  Negative for abdominal pain, constipation and diarrhea.  Genitourinary: Negative.   Musculoskeletal:  Negative for joint pain and myalgias.  Skin: Negative.   Neurological: Negative.  Negative for dizziness and headaches.  Endo/Heme/Allergies: Negative.   All other systems reviewed and are negative.      Objective:   BP 115/60   Pulse 91   Ht 5' 4 (1.626 m)   Wt 124 lb 6.4 oz (56.4 kg)   SpO2 98%   BMI 21.35 kg/m   Vitals:   10/13/23 1305  BP: 115/60  Pulse: 91  Height: 5' 4 (1.626 m)  Weight: 124 lb 6.4 oz (56.4 kg)  SpO2: 98%  BMI (Calculated): 21.34    Physical Exam Vitals and nursing note reviewed.  Constitutional:      Appearance: Normal appearance. She is normal weight.  HENT:     Head: Normocephalic and atraumatic.     Nose: Nose normal.     Mouth/Throat:     Mouth: Mucous membranes are moist.   Eyes:     Extraocular Movements: Extraocular movements intact.     Conjunctiva/sclera: Conjunctivae normal.     Pupils: Pupils are equal, round, and reactive to light.    Cardiovascular:     Rate and  Rhythm: Normal rate and regular rhythm.     Pulses: Normal pulses.     Heart sounds: Normal heart sounds.  Pulmonary:     Effort: Pulmonary effort is normal.     Breath sounds: Normal breath sounds.  Abdominal:     General: Abdomen is flat. Bowel sounds are normal.     Palpations: Abdomen is soft.   Musculoskeletal:        General: Normal range of motion.     Cervical back: Normal range of motion.   Skin:    General: Skin is warm and dry.   Neurological:     General: No focal deficit present.     Mental Status: She is alert and oriented to person, place, and time.   Psychiatric:        Mood and Affect: Mood normal.        Behavior: Behavior normal.        Thought Content: Thought content normal.        Judgment: Judgment normal.      No results found for any visits on 10/13/23.  Recent Results (from the past 2160 hours)  POCT Urinalysis Dipstick     Status: Abnormal   Collection Time: 08/21/23  2:38 PM  Result Value Ref Range   Color, UA yellow    Clarity, UA cloudy    Glucose, UA Negative Negative   Bilirubin, UA negative    Ketones, UA positive     Comment: 40 mg/dl   Spec Grav, UA 8.984 8.989 - 1.025   Blood, UA Moderate    pH, UA 6.5 5.0 - 8.0   Protein, UA Positive (A) Negative    Comment: 100 mg/dl   Urobilinogen, UA 0.2 0.2 or 1.0 E.U./dL   Nitrite,  UA Positive    Leukocytes, UA Large (3+) (A) Negative   Appearance     Odor    HIV antibody (with reflex)     Status: None   Collection Time: 08/25/23 10:36 AM  Result Value Ref Range   HIV Screen 4th Generation wRfx Non Reactive Non Reactive    Comment: HIV-1/HIV-2 antibodies and HIV-1 p24 antigen were NOT detected. There is no laboratory evidence of HIV infection. HIV Negative   RPR     Status: None   Collection Time: 08/25/23 10:36 AM  Result Value Ref Range   RPR Ser Ql Non Reactive Non Reactive  HSV 1 and 2 Ab, IgG     Status: Abnormal   Collection Time: 08/25/23 10:36 AM  Result Value Ref  Range   HSV 1 Glycoprotein G Ab, IgG Non Reactive Non Reactive    Comment: **Please note reference interval change** HSV-1 IgG testing performed using the Roche Elecsys HSV-1 IgG assay.    HSV 2 IgG, Type Spec Reactive (A) Non Reactive    Comment: **Please note reference interval change** Current guidelines and recommendations do not recommend routine screening for HSV-2 in asymptomatic individuals, including those that are pregnant. The detection of HSV-2 IgG antibodies in a single sample indicates previous exposure to HSV-2 but does not give information as to the site of HSV infection or the timing of exposure. The predictive value of positive and negative results depends on the population's prevalence and the pretest likelihood of HSV-2. HSV-2 IgG testing performed using the Roche Elecsys HSV-2 IgG assay.   GC/Chlamydia Probe Amp([facility])     Status: None   Collection Time: 08/25/23 10:40 AM   UR  Result Value Ref Range   Chlamydia trachomatis, NAA Negative Negative   Neisseria Gonorrhoeae by PCR Negative Negative  POCT URINALYSIS DIP ([facility])     Status: Abnormal   Collection Time: 09/10/23  1:56 PM  Result Value Ref Range   Color, UA yellow yellow   Clarity, UA clear clear   Glucose, UA negative negative mg/dL   Bilirubin, UA negative negative   Ketones, POC UA negative negative mg/dL   Spec Grav, UA 8.984 8.989 - 1.025   Blood, UA trace-intact (A) negative   pH, UA 6.5 5.0 - 8.0   POC PROTEIN,UA negative negative, trace   Urobilinogen, UA 0.2 0.2 or 1.0 E.U./dL   Nitrite, UA Positive (A) Negative   Leukocytes, UA Large (3+) (A) Negative  TSH     Status: None   Collection Time: 10/09/23 10:12 AM  Result Value Ref Range   TSH 2.350 0.450 - 4.500 uIU/mL  Hemoglobin A1c     Status: None   Collection Time: 10/09/23 10:12 AM  Result Value Ref Range   Hgb A1c MFr Bld 5.2 4.8 - 5.6 %    Comment:          Prediabetes: 5.7 - 6.4          Diabetes: >6.4          Glycemic  control for adults with diabetes: <7.0    Est. average glucose Bld gHb Est-mCnc 103 mg/dL  Lipid panel     Status: Abnormal   Collection Time: 10/09/23 10:12 AM  Result Value Ref Range   Cholesterol, Total 179 100 - 199 mg/dL   Triglycerides 77 0 - 149 mg/dL   HDL 57 >60 mg/dL   VLDL Cholesterol Cal 14 5 - 40 mg/dL   LDL Chol Calc (NIH) 891 (H) 0 -  99 mg/dL   Chol/HDL Ratio 3.1 0.0 - 4.4 ratio    Comment:                                   T. Chol/HDL Ratio                                             Men  Women                               1/2 Avg.Risk  3.4    3.3                                   Avg.Risk  5.0    4.4                                2X Avg.Risk  9.6    7.1                                3X Avg.Risk 23.4   11.0   CMP14+EGFR     Status: Abnormal   Collection Time: 10/09/23 10:12 AM  Result Value Ref Range   Glucose 86 70 - 99 mg/dL   BUN 9 6 - 24 mg/dL   Creatinine, Ser 9.39 0.57 - 1.00 mg/dL   eGFR 889 >40 fO/fpw/8.26   BUN/Creatinine Ratio 15 9 - 23   Sodium 142 134 - 144 mmol/L   Potassium 4.7 3.5 - 5.2 mmol/L   Chloride 105 96 - 106 mmol/L   CO2 23 20 - 29 mmol/L   Calcium 9.6 8.7 - 10.2 mg/dL   Total Protein 6.4 6.0 - 8.5 g/dL   Albumin 4.6 3.9 - 4.9 g/dL   Globulin, Total 1.8 1.5 - 4.5 g/dL   Bilirubin Total 0.8 0.0 - 1.2 mg/dL   Alkaline Phosphatase 39 (L) 44 - 121 IU/L   AST 15 0 - 40 IU/L   ALT 11 0 - 32 IU/L      Assessment & Plan:  Continue same medications.   Problem List Items Addressed This Visit       Other   Hyperlipidemia - Primary    Return in about 4 months (around 02/13/2024) for fasting labs prior.   Total time spent: 25 minutes  [employer], NP  10/13/2023   This document may have been prepared by Dragon Voice Recognition software and as such may include unintentional dictation errors.

## 2023-10-29 ENCOUNTER — Other Ambulatory Visit: Payer: Self-pay | Admitting: Obstetrics and Gynecology

## 2023-10-29 DIAGNOSIS — N938 Other specified abnormal uterine and vaginal bleeding: Secondary | ICD-10-CM

## 2023-11-02 ENCOUNTER — Encounter: Payer: Self-pay | Admitting: Dermatology

## 2023-11-05 ENCOUNTER — Encounter: Payer: Self-pay | Admitting: Dermatology

## 2023-11-05 ENCOUNTER — Ambulatory Visit: Admitting: Dermatology

## 2023-11-05 VITALS — BP 125/69 | HR 75

## 2023-11-05 DIAGNOSIS — D2371 Other benign neoplasm of skin of right lower limb, including hip: Secondary | ICD-10-CM

## 2023-11-05 DIAGNOSIS — Z86018 Personal history of other benign neoplasm: Secondary | ICD-10-CM

## 2023-11-05 DIAGNOSIS — D239 Other benign neoplasm of skin, unspecified: Secondary | ICD-10-CM

## 2023-11-05 MED ORDER — MUPIROCIN 2 % EX OINT
1.0000 | TOPICAL_OINTMENT | Freq: Two times a day (BID) | CUTANEOUS | 2 refills | Status: DC
Start: 2023-11-05 — End: 2023-12-30

## 2023-11-05 NOTE — Progress Notes (Unsigned)
   Follow-Up Visit   Subjective  Melissa Pruitt is a 48 y.o. female who presents for the following: Excision of DN  The following portions of the chart were reviewed this encounter and updated as appropriate: medications, allergies, medical history  Review of Systems:  No other skin or systemic complaints except as noted in HPI or Assessment and Plan.  Objective  Well appearing patient in no apparent distress; mood and affect are within normal limits.  A focused examination was performed of the following areas: Right foot Relevant physical exam findings are noted in the Assessment and Plan.     Assessment & Plan   DYSPLASTIC NEVUS right lateral sole anterior Skin excision - right lateral sole anterior  Excision method:  elliptical Lesion length (cm):  1.1 Lesion width (cm):  0.8 Margin per side (cm):  0.5 Total excision diameter (cm):  2.1 Informed consent: discussed and consent obtained   Timeout: patient name, date of birth, surgical site, and procedure verified   Procedure prep:  Patient was prepped and draped in usual sterile fashion Prep type:  Chlorhexidine Anesthesia: the lesion was anesthetized in a standard fashion   Anesthetic:  1% lidocaine  w/ epinephrine 1-100,000 buffered w/ 8.4% NaHCO3 Instrument used: #15 blade   Hemostasis achieved with: pressure, Gelfoam and electrodesiccation   Outcome: patient tolerated procedure well with no complications   Post-procedure details: sterile dressing applied and wound care instructions given   Dressing type: bandage, pressure dressing and petrolatum    Specimen 1 - Surgical pathology Differential Diagnosis: DN [id number] Check Margins: No   Return in about 4 weeks (around 12/03/2023) for wound check.  I, Berwyn Lesches, Surg Tech III, am acting as scribe for RUFUS CHRISTELLA HOLY, MD.   Documentation: I have reviewed the above documentation for accuracy and completeness, and I agree with the above.  RUFUS CHRISTELLA HOLY, MD

## 2023-11-05 NOTE — Patient Instructions (Signed)

## 2023-11-06 LAB — SURGICAL PATHOLOGY

## 2023-11-09 ENCOUNTER — Other Ambulatory Visit: Payer: Self-pay | Admitting: Obstetrics and Gynecology

## 2023-11-09 ENCOUNTER — Encounter: Payer: Self-pay | Admitting: Obstetrics and Gynecology

## 2023-11-09 DIAGNOSIS — N938 Other specified abnormal uterine and vaginal bleeding: Secondary | ICD-10-CM

## 2023-11-09 MED ORDER — NORETHINDRONE 0.35 MG PO TABS
1.0000 | ORAL_TABLET | Freq: Every day | ORAL | 3 refills | Status: DC
Start: 2023-11-09 — End: 2023-11-26

## 2023-11-11 ENCOUNTER — Ambulatory Visit: Payer: Self-pay | Admitting: Dermatology

## 2023-11-26 ENCOUNTER — Telehealth: Admitting: Obstetrics and Gynecology

## 2023-11-26 DIAGNOSIS — N938 Other specified abnormal uterine and vaginal bleeding: Secondary | ICD-10-CM

## 2023-11-26 DIAGNOSIS — Z01419 Encounter for gynecological examination (general) (routine) without abnormal findings: Secondary | ICD-10-CM

## 2023-11-26 DIAGNOSIS — Z1231 Encounter for screening mammogram for malignant neoplasm of breast: Secondary | ICD-10-CM

## 2023-11-26 MED ORDER — NORETHINDRONE 0.35 MG PO TABS: 1.0000 | ORAL_TABLET | Freq: Every day | ORAL | 4 refills | Status: AC

## 2023-11-26 MED ORDER — ESTRADIOL 1 MG PO TABS
1.0000 mg | ORAL_TABLET | Freq: Every day | ORAL | 11 refills | Status: AC
Start: 2023-11-26 — End: 2024-11-25

## 2023-11-26 NOTE — Progress Notes (Unsigned)
 GYNECOLOGY VIRTUAL VISIT ENCOUNTER NOTE  Provider location: [facility] {[place]ank single:[id number]::[facility],[place],[facility],[place],[facility]}   Patient location: Home  I connected with Melissa Pruitt on 11/26/23 at 10:55 AM EDT by MyChart Video Encounter and verified that I am speaking with the correct person using two identifiers.   I discussed the limitations, risks, security and privacy concerns of performing an evaluation and management service virtually and the availability of in person appointments. I also discussed with the patient that there may be a patient responsible charge related to this service. The patient expressed understanding and agreed to proceed.   History:  Melissa Pruitt is a 48 y.o. G3P3 female being evaluated today for    Has used it for a few days at a time when on stronger antibtioitcs fo   Last use estradiol  3-4 months ago for BTB whenever ABX for UTS,   Only use   Has not had sex in 1.5 year an ahalf. 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   R mid sole superior moderate atypia    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   R med sole inferior moderate atypia    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   R dorsum lat great toe   Dysplastic nevus 06/11/2020   left prox lat thigh sup, - severe   Dysplastic nevus 06/11/2020   left prox lat thigh inf, - moderate   Dysplastic nevus 06/11/2020   R mid to upper back 2.0cm lat to spine, Severe atypia   Dysplastic nevus 06/11/2020   R distal post thigh, moderate atypia   Dysplastic nevus 08/23/2020   Right  labia - mild   Dysplastic nevus 08/12/2021   R lat neck post, moderate atypia   Dysplastic nevus 06/11/2012   L dorsal foot, mild atypia, bx from Dr. Glendale Many   Dysplastic nevus 01/26/2023   Right lateral anterior sole. Moderate atypia, close to margin.   Dysplastic nevus 01/26/2023   Right lateral sole posterior. Moderate to severe atypia, close to margin.   History of recurrent UTIs    HPV (human papilloma virus) infection    HSV (herpes simplex virus) infection    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[facility])    sacro/coccygeal mass excision   Melanoma (HCC) 02/16/2020   R mid plantar sole, BRESLOW'S DEPTH/MAXIMUM TUMOR THICKNESS: 0.5 MM, CLARK/ANATOMIC LEVEL: II Excised 03/20/2020   Normocytic anemia 08/21/2014   Tethered spinal cord (HCC)    Vitamin B 12 deficiency 08/21/2014   Past Surgical History:  Procedure Laterality Date   BACK SURGERY     BLADDER SURGERY     COLOSTOMY     after flex sig as 48 year old.    colostomy reversal     EXPLORATORY LAPAROTOMY     with colostomy placement   LEEP     removal of cervical cancer     TUBAL LIGATION     The following portions of the patient's history were reviewed and updated as appropriate: allergies, current medications, past family history, past medical history, past social history, past surgical history and problem  list.   Health Maintenance:  Normal pap and negative HRHPV on ***.  Normal mammogram on ***.   Review of Systems:  Pertinent items noted in HPI and remainder of comprehensive ROS otherwise negative.  Physical Exam:   General:  Alert, oriented and cooperative. Patient appears to be in no acute distress.  Mental Status: Normal mood and affect. Normal behavior. Normal judgment and thought content.   Respiratory: Normal respiratory effort, no problems with respiration noted  Rest of physical exam deferred due to type of encounter  Labs and Imaging No results found for this or any previous visit (from the past  2 weeks). No results found.     Assessment and Plan:     There are no diagnoses linked to this encounter.      I discussed the assessment and treatment plan with the patient. The patient was provided an opportunity to ask questions and all were answered. The patient agreed with the plan and demonstrated an understanding of the instructions.   The patient was advised to call back or seek an in-person evaluation/go to the ED if the symptoms worsen or if the condition fails to improve as anticipated.  I provided *** minutes of face-to-face time during this encounter. I also spent *** minutes dedicated to the care of this patient including pre-visit review of records, post visit ordering of medications and appropriate tests or procedures, coordinating care and documenting this visit encounter.    Carter Quarry, MD [facility] [employer]

## 2023-12-03 ENCOUNTER — Ambulatory Visit: Admitting: Dermatology

## 2023-12-03 ENCOUNTER — Encounter: Payer: Self-pay | Admitting: Dermatology

## 2023-12-03 DIAGNOSIS — Z86018 Personal history of other benign neoplasm: Secondary | ICD-10-CM | POA: Diagnosis not present

## 2023-12-03 DIAGNOSIS — S91301D Unspecified open wound, right foot, subsequent encounter: Secondary | ICD-10-CM | POA: Diagnosis not present

## 2023-12-03 DIAGNOSIS — T1490XD Injury, unspecified, subsequent encounter: Secondary | ICD-10-CM

## 2023-12-03 DIAGNOSIS — D239 Other benign neoplasm of skin, unspecified: Secondary | ICD-10-CM

## 2023-12-03 NOTE — Patient Instructions (Signed)

## 2023-12-03 NOTE — Progress Notes (Unsigned)
   Follow Up Visit   Subjective  Melissa Pruitt is a 48 y.o. female who presents for the following: follow up from Mohs surgery   The patient presents for follow up from surgery for a DN on the right lateral sole anterior, treated on 11/05/23, repaired with 2nd intention. The patient has been bandaging the wound as directed. The endorse the following concerns: none  The following portions of the chart were reviewed this encounter and updated as appropriate: medications, allergies, medical history  Review of Systems:  No other skin or systemic complaints except as noted in HPI or Assessment and Plan.  Objective  Well appearing patient in no apparent distress; mood and affect are within normal limits.  A focal examination was performed including scalp, head, face and right lateral sole anterior. All findings within normal limits unless otherwise noted below.  Healing wound with mild erythema  Relevant physical exam findings are noted in the Assessment and Plan.    Assessment & Plan   Healing Wound s/p excision DN, treated on 11/05/23, repaired with 2nd intention - Reassured that wound is healing well - No evidence of infection - No swelling, induration, purulence, dehiscence, or tenderness out of proportion to the clinical exam, see photo above - Discussed that scars take up to 12 months to mature from the date of surgery - Recommend SPF 30+ to scar daily to prevent purple color from UV exposure during scar maturation process - Discussed that erythema and raised appearance of scar will fade over the next 4-6 months - OK to start scar massage at 4-6 weeks post-op - Can consider silicone based products for scar healing starting at 6 weeks post-op - Ok to continue ointment daily to wound under a bandage for another week  History of Dysplastic Nevi - No evidence of recurrence today - Recommend regular full body skin exams - Recommend daily broad spectrum sunscreen SPF 30+ to  sun-exposed areas, reapply every 2 hours as needed.  - Call if any new or changing lesions are noted between office visits  Return in about 4 weeks (around 12/31/2023) for wound check.  I, Melissa Pruitt, CMA, am acting as scribe for Melissa CHRISTELLA HOLY, MD.   Documentation: I have reviewed the above documentation for accuracy and completeness, and I agree with the above.  Melissa CHRISTELLA HOLY, MD

## 2023-12-10 ENCOUNTER — Other Ambulatory Visit: Payer: Self-pay | Admitting: Cardiology

## 2023-12-10 ENCOUNTER — Other Ambulatory Visit: Payer: Self-pay | Admitting: Obstetrics and Gynecology

## 2023-12-30 ENCOUNTER — Encounter: Payer: Self-pay | Admitting: Dermatology

## 2023-12-30 ENCOUNTER — Ambulatory Visit: Admitting: Dermatology

## 2023-12-30 VITALS — BP 168/64 | HR 121

## 2023-12-30 DIAGNOSIS — T1490XD Injury, unspecified, subsequent encounter: Secondary | ICD-10-CM

## 2023-12-30 DIAGNOSIS — Z48817 Encounter for surgical aftercare following surgery on the skin and subcutaneous tissue: Secondary | ICD-10-CM | POA: Diagnosis not present

## 2023-12-30 DIAGNOSIS — Z86018 Personal history of other benign neoplasm: Secondary | ICD-10-CM | POA: Diagnosis not present

## 2023-12-30 DIAGNOSIS — D239 Other benign neoplasm of skin, unspecified: Secondary | ICD-10-CM

## 2023-12-30 MED ORDER — MUPIROCIN 2 % EX OINT: 1.0000 | TOPICAL_OINTMENT | Freq: Two times a day (BID) | CUTANEOUS | 2 refills | Status: AC

## 2023-12-30 NOTE — Patient Instructions (Signed)

## 2023-12-30 NOTE — Progress Notes (Signed)
   Follow Up Visit   Subjective  Melissa Pruitt is a 48 y.o. 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 The patient has been bandaging the wound as directed. The endorse the following concerns: No questions or concerns at this time.   The following portions of the chart were reviewed this encounter and updated as appropriate: medications, allergies, medical history  Review of Systems:  No other skin or systemic complaints except as noted in HPI or Assessment and Plan.  Objective  Well appearing patient in no apparent distress; mood and affect are within normal limits.  A focal examination was performed including scalp, head, face and right foot. All findings within normal limits unless otherwise noted below.  Healing wound with mild erythema  Relevant physical exam findings are noted in the Assessment and Plan.    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, treated on 11/05/23, repaired with second intention - Reassured that wound is healing well - No evidence of infection - No swelling, induration, purulence, dehiscence, or tenderness out of proportion to the clinical exam, see photo above - Discussed that scars take up to 12 months to mature from the date of surgery - Recommend SPF 30+ to scar daily to prevent purple color from UV exposure during scar maturation process - Discussed that erythema and raised appearance of scar will fade over the next 4-6 months - OK to start scar massage at 4-6 weeks post-op - Can consider silicone based products for scar healing starting at 6 weeks post-op - Ok to continue ointment daily to wound under a bandage for another 10-14 days  HISTORY OF DYSPLASTIC NEVUS No evidence of recurrence today Recommend regular full body skin exams Recommend  daily broad spectrum sunscreen SPF 30+ to sun-exposed areas, reapply every 2 hours as needed.  Call if any new or changing lesions are noted between office visits    Return in about 3 months (around 03/30/2024) for Can schedule a TBSC with Erminio or Dr. CHARM FERNS, Berwyn Lesches, Surg Tech III, am acting as scribe for RUFUS CHRISTELLA HOLY, MD.   Documentation: I have reviewed the above documentation for accuracy and completeness, and I agree with the above.  RUFUS CHRISTELLA HOLY, MD

## 2024-02-15 ENCOUNTER — Ambulatory Visit: Admitting: Cardiology

## 2024-02-15 ENCOUNTER — Emergency Department (HOSPITAL_COMMUNITY)
Admission: EM | Admit: 2024-02-15 | Discharge: 2024-02-15 | Disposition: A | Discharge status: Home / Self Care | Attending: Emergency Medicine | Admitting: Emergency Medicine

## 2024-02-15 ENCOUNTER — Other Ambulatory Visit: Payer: Self-pay

## 2024-02-15 DIAGNOSIS — R112 Nausea with vomiting, unspecified: Secondary | ICD-10-CM | POA: Diagnosis present

## 2024-02-15 DIAGNOSIS — Z91040 Latex allergy status: Secondary | ICD-10-CM | POA: Insufficient documentation

## 2024-02-15 DIAGNOSIS — K2900 Acute gastritis without bleeding: Secondary | ICD-10-CM

## 2024-02-15 LAB — CBC
HCT: 43.2 % (ref 36.0–46.0)
Hemoglobin: 13.7 g/dL (ref 12.0–15.0)
MCH: 30.9 pg (ref 26.0–34.0)
MCHC: 31.7 g/dL (ref 30.0–36.0)
MCV: 97.3 fL (ref 80.0–100.0)
Platelets: 236 K/uL (ref 150–400)
RBC: 4.44 MIL/uL (ref 3.87–5.11)
RDW: 13 % (ref 11.5–15.5)
WBC: 9.3 K/uL (ref 4.0–10.5)
nRBC: 0 % (ref 0.0–0.2)

## 2024-02-15 LAB — URINALYSIS, ROUTINE W REFLEX MICROSCOPIC
Bilirubin Urine: NEGATIVE
Glucose, UA: NEGATIVE mg/dL
Hgb urine dipstick: NEGATIVE
Ketones, ur: 80 mg/dL — AB
Nitrite: NEGATIVE
Protein, ur: 100 mg/dL — AB
Specific Gravity, Urine: 1.021 (ref 1.005–1.030)
pH: 5 (ref 5.0–8.0)

## 2024-02-15 LAB — COMPREHENSIVE METABOLIC PANEL WITH GFR
ALT: 8 U/L (ref 0–44)
AST: 15 U/L (ref 15–41)
Albumin: 4.6 g/dL (ref 3.5–5.0)
Alkaline Phosphatase: 36 U/L — ABNORMAL LOW (ref 38–126)
Anion gap: 14 (ref 5–15)
BUN: 9 mg/dL (ref 6–20)
CO2: 11 mmol/L — ABNORMAL LOW (ref 22–32)
Calcium: 8.7 mg/dL — ABNORMAL LOW (ref 8.9–10.3)
Chloride: 108 mmol/L (ref 98–111)
Creatinine, Ser: 0.66 mg/dL (ref 0.44–1.00)
GFR, Estimated: >60 mL/min (ref >60)
Glucose, Bld: 128 mg/dL — ABNORMAL HIGH (ref 70–99)
Potassium: 4.4 mmol/L (ref 3.5–5.1)
Sodium: 133 mmol/L — ABNORMAL LOW (ref 135–145)
Total Bilirubin: 0.6 mg/dL (ref 0.0–1.2)
Total Protein: 6.7 g/dL (ref 6.5–8.1)

## 2024-02-15 LAB — LIPASE, BLOOD: Lipase: 24 U/L (ref 11–51)

## 2024-02-15 MED ORDER — SODIUM CHLORIDE 0.9 % IV BOLUS
1000.0000 mL | Freq: Once | INTRAVENOUS | Status: AC
  Administered 2024-02-15: 1000 mL via INTRAVENOUS

## 2024-02-15 MED ORDER — DIPHENHYDRAMINE HCL 50 MG/ML IJ SOLN
12.5000 mg | Freq: Once | INTRAMUSCULAR | Status: AC
  Administered 2024-02-15: 12.5 mg via INTRAVENOUS
  Filled 2024-02-15: qty 1

## 2024-02-15 MED ORDER — PANTOPRAZOLE SODIUM 40 MG IV SOLR
40.0000 mg | Freq: Once | INTRAVENOUS | Status: AC
  Administered 2024-02-15: 40 mg via INTRAVENOUS
  Filled 2024-02-15: qty 10

## 2024-02-15 MED ORDER — PROMETHAZINE (PHENERGAN) 6.25MG IN NS 50ML IVPB
6.2500 mg | Freq: Four times a day (QID) | INTRAVENOUS | Status: DC | PRN
  Administered 2024-02-15: 6.25 mg via INTRAVENOUS
  Filled 2024-02-15: qty 6.25

## 2024-02-15 MED ORDER — ACETAMINOPHEN 500 MG PO TABS
1000.0000 mg | ORAL_TABLET | Freq: Once | ORAL | Status: AC
  Administered 2024-02-15: 1000 mg via ORAL
  Filled 2024-02-15: qty 2

## 2024-02-15 MED ORDER — PROMETHAZINE HCL 25 MG PO TABS: 25.0000 mg | ORAL_TABLET | Freq: Four times a day (QID) | ORAL | 0 refills | Status: AC | PRN

## 2024-02-15 NOTE — ED Provider Notes (Signed)
  Physical Exam  BP (!) 109/54   Pulse 83   Temp 98.2 F (36.8 C)   Resp 18   Wt 58.1 kg   SpO2 100%   BMI 21.97 kg/m   Physical Exam  Procedures  Procedures  ED Course / MDM    Medical Decision Making Amount and/or Complexity of Data Reviewed Labs: ordered.  Risk OTC drugs. Prescription drug management.     48 year old female here today with abdominal pain nausea and vomiting, believes is related to gelatin ingested with her alpha gal.  She received Phenergan  earlier today with improvement in her symptoms.  Blood work ordered shows no elevation LFTs, normal alk phos, normal lipase.  Urine does not show evidence of infection.  She has been tolerating p.o. here in the ED.  Will discharge.     Mannie Pac T, DO 02/15/24 1916

## 2024-02-15 NOTE — Discharge Instructions (Signed)
 While you are in the emergency room, you had blood work done that was normal.  Your symptoms should begin to improve with more time.  I have sent a prescription for Phenergan .  You may use this as needed for nausea.  Symptoms should improve over the next few days.  Return to the emergency room if you are unable to eat or drink, or develop worsening pain.

## 2024-02-15 NOTE — ED Provider Notes (Signed)
 [place] EMERGENCY DEPARTMENT AT [facility] Provider Note   CSN: [id number] Arrival date & time: 02/15/24  1227     Patient presents with: Nausea and Emesis (Alpha gal syndrome)   Melissa Pruitt is a 48 y.o. female.  {Add pertinent medical, surgical, social history, OB history to HPI:54108} HPI 48 year old female presents today with back pain, nausea, vomiting.  Symptoms began yesterday after ingesting Jell-O.  She reports a history of spina bifida and alpha gal syndrome.  She denies fever, chills, chest pain, cough, abdominal pain, rash, hypotension.  She has to In-N-Out cath and has no rectal control due to her spina bifida.  She has not noticed any change in her bowel from baseline.  She states she has had history of E. coli infections but has not noted anything new recently.  She reports very little alcohol intake.  She has not been able keep her medications down today.  She reports receiving Zofran  without relief    Prior to Admission medications   Medication Sig Start Date End Date Taking? Authorizing Provider  Clindamycin -Benzoyl Per, Refr, gel Apply 1 Application topically 2 (two) times daily. 06/12/23   Scoggins, Amber, NP  clobetasol  cream (TEMOVATE ) 0.05 % Apply twice daily to bites until resolved. Avoid applying to face, groin, and axilla. 12/18/22   Claudene Lehmann, MD  conjugated estrogens  (PREMARIN ) vaginal cream Place 1 Applicatorful vaginally 2 (two) times a week. Place 0.5g nightly for two weeks then twice a week after Patient not taking: Reported on 11/26/2023 03/01/20   Marilynne Rosaline SAILOR, MD  Cyanocobalamin  (VITAMIN B-12) 1000 MCG SUBL Place under the tongue as needed.    [provider]  EPINEPHrine 0.3 mg/0.3 mL IJ SOAJ injection Inject 0.3 mg as directed See admin instructions. Patient not taking: Reported on 11/26/2023 06/14/20   [provider]  estradiol  (ESTRACE ) 1 MG tablet Take 1 tablet (1 mg total) by mouth daily. Take for 2  weeks each month as needed for breakthrough bleeding only 11/26/23 11/25/24  Ajewole, Christana, MD  fluticasone (FLONASE) 50 MCG/ACT nasal spray Place 2 sprays into the nose daily.    [provider]  hydrocortisone  2.5 % cream Apply topically at bedtime. Apply to under left arm on Monday, Wednesday and Friday. Patient not taking: Reported on 11/26/2023 06/11/20   Hester Alm BROCKS, MD  loratadine (CLARITIN) 10 MG tablet Take 10 mg by mouth daily.    [provider]  methocarbamol (ROBAXIN) 750 MG tablet Take 750 mg by mouth 3 (three) times daily. Patient not taking: Reported on 11/26/2023    [provider]  mometasone (ELOCON) 0.1 % ointment 1 application Externally sparingly twice a day for 30 days Patient not taking: Reported on 11/26/2023 12/05/22   [provider]  mupirocin  ointment (BACTROBAN ) 2 % Apply 1 Application topically 2 (two) times daily. 12/30/23   Paci, Karina M, MD  norethindrone  (MICRONOR ) 0.35 MG tablet Take 1 tablet (0.35 mg total) by mouth daily. 11/26/23   Ajewole, Christana, MD  promethazine  (PHENERGAN ) 12.5 MG tablet Take 1 tablet (12.5 mg total) by mouth every 8 (eight) hours as needed for nausea or vomiting. 11/12/22   Zuleta, Kaitlin G, NP    Allergies: Ciprofloxacin , Nitrofurantoin , Sulfa antibiotics, Azithromycin, Bovine (beef) protein, Lactose, Latex, Prednisone , Rosuvastatin, Tramadol hcl, Vitamin d analogs, Lidocaine , and Porcine (pork) protein-containing drug products    Review of Systems  Updated Vital Signs BP 120/69 (BP Location: Left Arm)   Pulse 78   Temp 98.1  F (36.7 C) (Oral)   Resp 18   Wt 58.1 kg   SpO2 100%   BMI 21.97 kg/m   Physical Exam Vitals reviewed.  Constitutional:      Appearance: Normal appearance.  HENT:     Head: Normocephalic.     Right Ear: External ear normal.     Left Ear: External ear normal.     Nose: Nose normal.     Mouth/Throat:     Pharynx: Oropharynx is clear.  Eyes:     Pupils:  Pupils are equal, round, and reactive to light.  Cardiovascular:     Rate and Rhythm: Normal rate and regular rhythm.     Pulses: Normal pulses.  Pulmonary:     Effort: Pulmonary effort is normal.     Breath sounds: Normal breath sounds.  Abdominal:     General: Abdomen is flat. Bowel sounds are normal. There is no distension.     Palpations: Abdomen is soft.     Tenderness: There is no abdominal tenderness.  Musculoskeletal:        General: Normal range of motion.     Cervical back: Normal range of motion.     Comments: Obvious signs of trauma no abnormalities noted on back and previous scar appears stable  Skin:    General: Skin is warm and dry.     Capillary Refill: Capillary refill takes less than 2 seconds.  Neurological:     General: No focal deficit present.     Mental Status: She is alert.  Psychiatric:        Mood and Affect: Mood normal.     (all labs ordered are listed, but only abnormal results are displayed) Labs Reviewed  CBC  COMPREHENSIVE METABOLIC PANEL WITH GFR  URINALYSIS, ROUTINE W REFLEX MICROSCOPIC  LIPASE, BLOOD    EKG: None  Radiology: No results found.  {Document cardiac monitor, telemetry assessment procedure when appropriate:32947} Procedures   Medications Ordered in the ED  promethazine  (PHENERGAN ) 6.25 mg/NS 50 mL IVPB (6.25 mg Intravenous New Bag/Given 02/15/24 1510)  diphenhydrAMINE  (BENADRYL ) injection 12.5 mg (12.5 mg Intravenous Given 02/15/24 1455)      {Click here for ABCD2, HEART and other calculators REFRESH Note before signing:1}                              Medical Decision Making Amount and/or Complexity of Data Reviewed Labs: ordered.  Risk Prescription drug management.   Lab work pending Patient received IV fluids and antiemetics continues to be nauseated Care discussed with Dr. Mannie who accepts patient  {Document critical care time when appropriate  Document review of labs and clinical decision tools ie  CHADS2VASC2, etc  Document your independent review of radiology images and any outside records  Document your discussion with family members, caretakers and with consultants  Document social determinants of health affecting pt's care  Document your decision making why or why not admission, treatments were needed:32947:::1}   Final diagnoses:  Nausea and vomiting, unspecified vomiting type    ED Discharge Orders     None

## 2024-02-16 ENCOUNTER — Telehealth: Payer: Self-pay | Admitting: Obstetrics and Gynecology

## 2024-02-16 DIAGNOSIS — R300 Dysuria: Secondary | ICD-10-CM

## 2024-02-16 NOTE — Telephone Encounter (Signed)
 Patient sent in a request for nurse visit today.  Per Kaitlin, after giving her sx that were sent in, she advised for her to come pick up an at home kit for [facility]ostics.  Patient came in and picked up kit today. The sx she sent in are as follows:  Comments: Feel like I need a PCR test. Was in the ER for an allergic reaction and they did a urine check 'just in case' is what the doctor told me. When I was discharged the doctor said my urine and blood work looked good, however when looking at the actual test results it shows there was bacteria and my back has been killing me for the past few days, I just assumed it was from throwing up violently from the allergic reaction. But now looking at the test results, I question that

## 2024-02-17 ENCOUNTER — Other Ambulatory Visit: Payer: Self-pay | Admitting: Cardiology

## 2024-02-17 DIAGNOSIS — Z1231 Encounter for screening mammogram for malignant neoplasm of breast: Secondary | ICD-10-CM

## 2024-02-17 NOTE — Telephone Encounter (Signed)
 Fax has been sent.

## 2024-02-22 ENCOUNTER — Other Ambulatory Visit: Payer: Self-pay | Admitting: Cardiology

## 2024-02-22 ENCOUNTER — Telehealth: Payer: Self-pay

## 2024-02-22 ENCOUNTER — Telehealth: Payer: Self-pay | Admitting: Obstetrics and Gynecology

## 2024-02-22 DIAGNOSIS — Z131 Encounter for screening for diabetes mellitus: Secondary | ICD-10-CM

## 2024-02-22 DIAGNOSIS — I10 Essential (primary) hypertension: Secondary | ICD-10-CM

## 2024-02-22 DIAGNOSIS — E782 Mixed hyperlipidemia: Secondary | ICD-10-CM

## 2024-02-22 DIAGNOSIS — Z1329 Encounter for screening for other suspected endocrine disorder: Secondary | ICD-10-CM

## 2024-02-22 NOTE — Telephone Encounter (Signed)
 Called and informed patient of negative pathnostics testing. 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 She would like to discuss her care with Dr. Marilynne.   Patient to see Dr. Marilynne for an appointment.

## 2024-02-22 NOTE — Telephone Encounter (Signed)
 Patient LM asking for her labs to be placed, can you put in her order please

## 2024-02-24 ENCOUNTER — Ambulatory Visit (INDEPENDENT_AMBULATORY_CARE_PROVIDER_SITE_OTHER): Admitting: Obstetrics and Gynecology

## 2024-02-24 ENCOUNTER — Encounter: Payer: Self-pay | Admitting: Obstetrics and Gynecology

## 2024-02-24 ENCOUNTER — Encounter: Payer: Self-pay | Admitting: Internal Medicine

## 2024-02-24 VITALS — BP 106/65 | HR 63

## 2024-02-24 DIAGNOSIS — N390 Urinary tract infection, site not specified: Secondary | ICD-10-CM | POA: Diagnosis not present

## 2024-02-24 DIAGNOSIS — R10A1 Flank pain, right side: Secondary | ICD-10-CM

## 2024-02-24 DIAGNOSIS — N319 Neuromuscular dysfunction of bladder, unspecified: Secondary | ICD-10-CM

## 2024-02-24 MED ORDER — ESTROGENS CONJUGATED 0.625 MG/GM VA CREA: 1.0000 g | TOPICAL_CREAM | VAGINAL | 11 refills | Status: AC

## 2024-02-24 MED ORDER — FOSFOMYCIN TROMETHAMINE 3 G PO PACK: 3.0000 g | PACK | Freq: Once | ORAL | 0 refills | Status: DC

## 2024-02-24 NOTE — Progress Notes (Signed)
 [facility] Return Visit  SUBJECTIVE  History of Present Illness: Melissa Pruitt is a 48 y.o. female seen in follow-up for neurogenic bladder, recurrent UTI and vaginal cyst.   History of myelomeningocele and duplicated right collecting system and self-catheterizes for neurogenic bladder.  Was recently hospitalized for vomiting due alpha gal and was very dehydrated. She started having right flank pain. Pain was more severe when she was in the ER and has been improving since then.  In the last few days, noticed increased urinary odor , felt some sharp pain with urinating (typically does not have dysuria). Does not have any blood in the urine. [facility] molecular testing was negative for any urinary tract infection on 11/10. She is using vaginal estrogen but needs refill.   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   Past Surgical History: She  has a past surgical history that includes Back surgery; Bladder surgery; Colostomy; Tubal ligation; removal of cervical cancer; colostomy reversal; Exploratory laparotomy; and LEEP.   Medications: She has a current medication list which includes the following prescription(s): clindamycin -benzoyl per (refr), clobetasol  cream,  conjugated estrogens , vitamin b-12, epinephrine, estradiol , fluticasone, hydrocortisone , loratadine, methocarbamol, mometasone, norethindrone , promethazine , and mupirocin  ointment, and the following Facility-Administered Medications: lidocaine .   Allergies: Patient is allergic to ciprofloxacin , nitrofurantoin , sulfa antibiotics, azithromycin, bovine (beef) protein, lactose, latex, prednisone , rosuvastatin, tramadol hcl, vitamin d analogs, lidocaine , and porcine (pork) protein-containing drug products.   Social History: Patient  reports that she quit smoking about 12 years ago. Her smoking use included cigarettes. She has never used smokeless tobacco. She reports current alcohol use. She reports that she does not use drugs.      OBJECTIVE     Physical Exam:  Gen: No apparent distress, A&O x 3. + mild CVA tenderness on right  Lab Results  Component Value Date   NA 133 (L) 02/15/2024   CL 108 02/15/2024   K 4.4 02/15/2024   CO2 11 (L) 02/15/2024   BUN 9 02/15/2024   CREATININE 0.66 02/15/2024   GFRNONAA >60 02/15/2024   CALCIUM 8.7 (L) 02/15/2024   ALBUMIN 4.6 02/15/2024   GLUCOSE 128 (H) 02/15/2024      ASSESSMENT AND PLAN    [name] is a 48 y.o. with:  No diagnosis found.    Neurogenic bladder - Continue self-catheterization   AUA NLUTD surveillance- Moderate risk due to DO with incomplete emptying on UDS: - annual renal function assessment: BUN and Cr wnl on 02/15/24 - upper tract imaging- q 1-2 yrs: CT 11/06/22 negative. - Since it has been over a year since she has had renal imaging and has the history of the right duplicated system with new flank pain, will obtain CT A/P with contrast - UDS repeat if change in sign/ symptoms - cysto only if rUTIs, hematuria or suspected anomaly: last cysto 01/15/23   2. UTI/ Flank pain - [facility] testing negative for infection so less concerned about pyelonephritis, esp since symptoms have been improving.  - Since she has  had  new urinary symptoms in the last day, will treat with a dose of fosfomycin.  -Refill provided with premarin  cream to use twice a week.   Return 6 months or sooner if needed  [name], MD

## 2024-02-26 ENCOUNTER — Ambulatory Visit: Admitting: Cardiology

## 2024-02-26 ENCOUNTER — Ambulatory Visit (HOSPITAL_BASED_OUTPATIENT_CLINIC_OR_DEPARTMENT_OTHER): Admission: RE | Admit: 2024-02-26 | Source: Ambulatory Visit

## 2024-02-26 ENCOUNTER — Ambulatory Visit (HOSPITAL_BASED_OUTPATIENT_CLINIC_OR_DEPARTMENT_OTHER)
Admission: RE | Admit: 2024-02-26 | Discharge: 2024-02-26 | Disposition: A | Discharge status: Home / Self Care | Source: Ambulatory Visit | Attending: Obstetrics and Gynecology | Admitting: Obstetrics and Gynecology

## 2024-02-26 DIAGNOSIS — R10A1 Flank pain, right side: Secondary | ICD-10-CM | POA: Diagnosis present

## 2024-02-26 MED ORDER — IOHEXOL 300 MG/ML  SOLN
100.0000 mL | Freq: Once | INTRAMUSCULAR | Status: AC | PRN
  Administered 2024-02-26: 100 mL via INTRAVENOUS

## 2024-03-01 ENCOUNTER — Ambulatory Visit: Payer: Self-pay | Admitting: Obstetrics and Gynecology

## 2024-03-01 ENCOUNTER — Ambulatory Visit: Payer: Self-pay | Admitting: Cardiology

## 2024-03-01 LAB — CMP14+EGFR
ALT: 11 IU/L (ref 0–32)
AST: 15 IU/L (ref 0–40)
Albumin: 4.2 g/dL (ref 3.9–4.9)
Alkaline Phosphatase: 39 IU/L — ABNORMAL LOW (ref 41–116)
BUN/Creatinine Ratio: 16 (ref 9–23)
BUN: 11 mg/dL (ref 6–24)
Bilirubin Total: <0.2 mg/dL (ref 0.0–1.2)
CO2: 24 mmol/L (ref 20–29)
Calcium: 9.4 mg/dL (ref 8.7–10.2)
Chloride: 104 mmol/L (ref 96–106)
Creatinine, Ser: 0.69 mg/dL (ref 0.57–1.00)
Globulin, Total: 1.9 g/dL (ref 1.5–4.5)
Glucose: 93 mg/dL (ref 70–99)
Potassium: 4.1 mmol/L (ref 3.5–5.2)
Sodium: 140 mmol/L (ref 134–144)
Total Protein: 6.1 g/dL (ref 6.0–8.5)
eGFR: 106 mL/min/1.73 (ref >59)

## 2024-03-01 LAB — HEMOGLOBIN A1C
Est. average glucose Bld gHb Est-mCnc: 100 mg/dL
Hgb A1c MFr Bld: 5.1 % (ref 4.8–5.6)

## 2024-03-01 LAB — LIPID PANEL
Chol/HDL Ratio: 3.5 ratio (ref 0.0–4.4)
Cholesterol, Total: 205 mg/dL — ABNORMAL HIGH (ref 100–199)
HDL: 59 mg/dL (ref >39)
LDL Chol Calc (NIH): 134 mg/dL — ABNORMAL HIGH (ref 0–99)
Triglycerides: 68 mg/dL (ref 0–149)
VLDL Cholesterol Cal: 12 mg/dL (ref 5–40)

## 2024-03-01 LAB — TSH: TSH: 6.5 u[IU]/mL — ABNORMAL HIGH (ref 0.450–4.500)

## 2024-03-03 ENCOUNTER — Encounter: Payer: Self-pay | Admitting: Cardiology

## 2024-03-03 ENCOUNTER — Ambulatory Visit: Admitting: Cardiology

## 2024-03-03 VITALS — BP 121/77 | HR 67 | Ht 64.0 in | Wt 121.4 lb

## 2024-03-03 DIAGNOSIS — Z1329 Encounter for screening for other suspected endocrine disorder: Secondary | ICD-10-CM | POA: Diagnosis not present

## 2024-03-03 DIAGNOSIS — Z13820 Encounter for screening for osteoporosis: Secondary | ICD-10-CM | POA: Diagnosis not present

## 2024-03-03 DIAGNOSIS — N319 Neuromuscular dysfunction of bladder, unspecified: Secondary | ICD-10-CM | POA: Diagnosis not present

## 2024-03-03 DIAGNOSIS — E782 Mixed hyperlipidemia: Secondary | ICD-10-CM

## 2024-03-03 DIAGNOSIS — Z131 Encounter for screening for diabetes mellitus: Secondary | ICD-10-CM

## 2024-03-03 DIAGNOSIS — Z0130 Encounter for examination of blood pressure without abnormal findings: Secondary | ICD-10-CM

## 2024-03-03 NOTE — Progress Notes (Signed)
 Established Patient Office Visit  Subjective:  Patient ID: Melissa Pruitt, female    DOB: [birth year]  Age: 48 y.o. MRN: [id number]  Chief Complaint  Patient presents with   Follow-up    4 month follow up with lab results    Patient in office for 4 month follow up, discuss recent lab results. Patient doing well, no new complaints today.  Discussed recent lab work. TSH elevated, will repeat in 4 weeks. LDL elevated, decrease fatty food intake. Normal kidney function, A1c and electrolytes.  Patient requesting a referral to cardiology, referral sent.  Patient follows with urology for neurogenic bladder, frequent UTIs.  Due for mammogram, order in, patient to call to schedule. Due for dexa scan, order placed.  Continue same medications.     No other concerns at this time.   Past Medical History:  Diagnosis Date   Abnormal Pap smear of cervix    Balance disorder 07/16/2012   Cervical cancer (HCC)    removed in July 2009   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   R mid sole superior moderate atypia    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   R med sole inferior moderate atypia    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   R dorsum lat great toe   Dysplastic nevus 06/11/2020   left prox lat thigh sup, - severe   Dysplastic nevus 06/11/2020   left prox lat thigh inf, - moderate   Dysplastic nevus 06/11/2020   R mid to upper back 2.0cm lat to spine, Severe atypia   Dysplastic nevus 06/11/2020   R distal post thigh, moderate atypia   Dysplastic nevus 08/23/2020   Right labia - mild   Dysplastic nevus 08/12/2021   R lat neck post, moderate atypia   Dysplastic nevus 06/11/2012   L dorsal foot, mild atypia, bx from Dr. Glendale Many   Dysplastic nevus 01/26/2023   Right lateral anterior sole. Moderate atypia, close to margin.   Dysplastic nevus 01/26/2023   Right lateral sole posterior. Moderate to severe atypia, close to margin.   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    colostomy reversal     EXPLORATORY LAPAROTOMY     with colostomy placement   LEEP     removal of cervical cancer     TUBAL LIGATION      Social History   Socioeconomic History   Marital status: Divorced    Spouse name: Not on file   Number of children: 3   Years of education: Not on file   Highest education level: Not on file  Occupational History   Occupation: disability    Employer: OTHER  Tobacco Use   Smoking status: Former    Current packs/day: 0.00    Types: Cigarettes    Quit date: 07/14/2011    Years since quitting: 12.6   Smokeless tobacco: Never   Tobacco comments:    1-3  cigarettes  Vaping Use   Vaping status: Never Used  Substance and Sexual Activity   Alcohol use: Yes    Comment: twice a year   Drug use: No   Sexual activity: Yes    Birth control/protection: Pill, Condom  Other Topics Concern   Not on file  Social History Narrative   Not on file   Social Drivers of Health   Financial Resource Strain: Not on file  Food Insecurity: No Food Insecurity (06/12/2022)   Hunger Vital Sign    Worried About Running Out of Food in the Last Year: Never true    Ran Out of Food in the Last Year: Never true  Transportation Needs: No Transportation Needs (06/12/2022)   PRAPARE - Administrator, Civil Service (Medical): No    Lack of Transportation (Non-Medical): No  Physical Activity: Not on file  Stress: Not on file  Social Connections: Unknown (08/27/2021)    [facility] from [facility]   Social Network    Social Network: Not on file  Intimate Partner Violence: Unknown (07/19/2021)   Received from [facility]   HITS    Physically Hurt: Not on file    Insult or Talk Down To: Not on file    Threaten Physical Harm: Not on file    Scream or Curse: Not on file    Family History  Problem Relation Age of Onset   Hypertension Mother    Atrial fibrillation Mother    Cervical cancer Maternal Grandmother    Colon cancer Neg Hx    Breast cancer Neg Hx     Allergies  Allergen Reactions   Ciprofloxacin  Anaphylaxis    Heart and nerve issues   Nitrofurantoin  Shortness Of Breath   Sulfa Antibiotics Anaphylaxis    Patient reporting sulfa drugs cause anaphylaxis, difficulty breathing and hives, as well as nausea vomiting   Alpha-Gal    Azithromycin Nausea And Vomiting   Bovine (Beef) Protein    Lactose     Other reaction(s): Other (See Comments) Digestive issues/pain   Latex    Prednisone  Other (See Comments)   Rosuvastatin Other (See Comments)   Tramadol Hcl     REACTION: HEART PALPITATIONS /SWELLING /SOB   Vitamin D Analogs     Severe Diarhhea   Lidocaine  Palpitations   Porcine (Pork) Protein-Containing Drug Products Nausea And Vomiting    Outpatient Medications Prior to Visit  Medication Sig   Clindamycin -Benzoyl Per, Refr, gel Apply 1 Application topically 2 (two) times daily.   clobetasol  cream (TEMOVATE ) 0.05 % Apply twice daily to bites until resolved. Avoid applying to face, groin, and axilla.   conjugated estrogens  (PREMARIN ) vaginal cream Place 1 Applicatorful vaginally 2 (two) times a week. Place 0.5g nightly for two weeks then twice a week after   conjugated estrogens  (PREMARIN ) vaginal cream Place 0.5 Applicatorfuls vaginally 2 (two) times a week.   cromolyn (GASTROCROM) 100 MG/5ML solution Take 200 mg by mouth 4 (four) times daily -  before meals and at bedtime.   Cyanocobalamin  (VITAMIN B-12) 1000 MCG SUBL Place under the  tongue as needed.   EPINEPHrine 0.3 mg/0.3 mL IJ SOAJ injection Inject 0.3 mg as directed See admin instructions. (Patient taking differently: Inject 0.3 mg as directed as needed for anaphylaxis.)   estradiol  (ESTRACE ) 1 MG tablet Take 1 tablet (1 mg total) by mouth daily. Take for 2 weeks each month as needed for breakthrough bleeding only   fluticasone (FLONASE) 50 MCG/ACT nasal spray Place 2 sprays  into the nose daily.   hydrocortisone  2.5 % cream Apply topically at bedtime. Apply to under left arm on Monday, Wednesday and Friday. (Patient taking differently: Apply topically as needed. Apply to under left arm on Monday, Wednesday and Friday.)   loratadine (CLARITIN) 10 MG tablet Take 10 mg by mouth daily.   methocarbamol (ROBAXIN) 750 MG tablet Take 750 mg by mouth 3 (three) times daily. (Patient taking differently: Take 750 mg by mouth every 8 (eight) hours as needed for muscle spasms.)   mometasone (ELOCON) 0.1 % ointment 1 application Externally sparingly twice a day for 30 days (Patient taking differently: as needed.)   montelukast (SINGULAIR) 10 MG tablet Take 10 mg by mouth daily.   mupirocin  ointment (BACTROBAN ) 2 % Apply 1 Application topically 2 (two) times daily.   Naltrexone HCl, Pain, (NALTREX) 4.5 MG CAPS Take 4.5 mg by mouth as directed.   norethindrone  (MICRONOR ) 0.35 MG tablet Take 1 tablet (0.35 mg total) by mouth daily.   promethazine  (PHENERGAN ) 25 MG tablet Take 1 tablet (25 mg total) by mouth every 6 (six) hours as needed for nausea or vomiting.   famotidine  (PEPCID ) 10 MG tablet Take 10 mg by mouth daily as needed.   hydrOXYzine (ATARAX) 10 MG tablet Take 10 mg by mouth at bedtime.   Facility-Administered Medications Prior to Visit  Medication Dose Route Frequency Provider   lidocaine  (XYLOCAINE ) 2 % jelly 1 Application  1 Application Urethral Once     Review of Systems  Constitutional: Negative.   HENT: Negative.    Eyes: Negative.   Respiratory: Negative.  Negative  for shortness of breath.   Cardiovascular: Negative.  Negative for chest pain.  Gastrointestinal: Negative.  Negative for abdominal pain, constipation and diarrhea.  Genitourinary: Negative.   Musculoskeletal:  Negative for joint pain and myalgias.  Skin: Negative.   Neurological: Negative.  Negative for dizziness and headaches.  Endo/Heme/Allergies: Negative.   All other systems reviewed and are negative.      Objective:   BP 121/77   Pulse 67   Ht 5' 4 (1.626 m)   Wt 121 lb 6.4 oz (55.1 kg)   SpO2 99%   BMI 20.84 kg/m   Vitals:   03/03/24 1029  BP: 121/77  Pulse: 67  Height: 5' 4 (1.626 m)  Weight: 121 lb 6.4 oz (55.1 kg)  SpO2: 99%  BMI (Calculated): 20.83    Physical Exam Vitals and nursing note reviewed.  Constitutional:      Appearance: Normal appearance. She is normal weight.  HENT:     Head: Normocephalic and atraumatic.     Nose: Nose normal.     Mouth/Throat:     Mouth: Mucous membranes are moist.  Eyes:     Extraocular Movements: Extraocular movements intact.     Conjunctiva/sclera: Conjunctivae normal.     Pupils: Pupils are equal, round, and reactive to light.  Cardiovascular:     Rate and Rhythm: Normal rate and regular rhythm.     Pulses: Normal pulses.     Heart sounds: Normal heart sounds.  Pulmonary:     Effort: Pulmonary effort is normal.     Breath sounds: Normal breath sounds.  Abdominal:     General: Abdomen is flat. Bowel sounds are normal.     Palpations: Abdomen is soft.  Musculoskeletal:        General: Normal range of motion.     Cervical back: Normal range of motion.  Skin:    General: Skin  is warm and dry.  Neurological:     General: No focal deficit present.     Mental Status: She is alert and oriented to person, place, and time.  Psychiatric:        Mood and Affect: Mood normal.        Behavior: Behavior normal.        Thought Content: Thought content normal.        Judgment: Judgment normal.      No results  found for any visits on 03/03/24.  Recent Results (from the past 2160 hours)  CBC     Status: None   Collection Time: 02/15/24 12:55 PM  Result Value Ref Range   WBC 9.3 4.0 - 10.5 K/uL   RBC 4.44 3.87 - 5.11 MIL/uL   Hemoglobin 13.7 12.0 - 15.0 g/dL   HCT 56.7 63.9 - 53.9 %   MCV 97.3 80.0 - 100.0 fL   MCH 30.9 26.0 - 34.0 pg   MCHC 31.7 30.0 - 36.0 g/dL   RDW 86.9 88.4 - 84.4 %   Platelets 236 150 - 400 K/uL   nRBC 0.0 0.0 - 0.2 %    Comment: Performed at [facility], [address], [place], [place] [address]  Urinalysis, Routine w reflex microscopic -Urine, Catheterized     Status: Abnormal   Collection Time: 02/15/24  3:16 PM  Result Value Ref Range   Color, Urine YELLOW YELLOW   APPearance HAZY (A) CLEAR   Specific Gravity, Urine 1.021 1.005 - 1.030   pH 5.0 5.0 - 8.0   Glucose, UA NEGATIVE NEGATIVE mg/dL   Hgb urine dipstick NEGATIVE NEGATIVE   Bilirubin Urine NEGATIVE NEGATIVE   Ketones, ur 80 (A) NEGATIVE mg/dL   Protein, ur 899 (A) NEGATIVE mg/dL   Nitrite NEGATIVE NEGATIVE   Leukocytes,Ua TRACE (A) NEGATIVE   RBC / HPF 0-5 0 - 5 RBC/hpf   WBC, UA 21-50 0 - 5 WBC/hpf   Bacteria, UA RARE (A) NONE SEEN   Squamous Epithelial / HPF 0-5 0 - 5 /HPF   Mucus PRESENT    Hyaline Casts, UA PRESENT    Granular Casts, UA PRESENT     Comment: Performed at [facility], [address], [place], [place] [address]  Comprehensive metabolic panel with GFR     Status: Abnormal   Collection Time: 02/15/24  6:43 PM  Result Value Ref Range   Sodium 133 (L) 135 - 145 mmol/L   Potassium 4.4 3.5 - 5.1 mmol/L   Chloride 108 98 - 111 mmol/L   CO2 11 (L) 22 - 32 mmol/L   Glucose, Bld 128 (H) 70 - 99 mg/dL    Comment: Glucose reference range applies only to samples taken after fasting for at least 8 hours.   BUN 9 6 - 20 mg/dL   Creatinine, Ser 9.33 0.44 - 1.00 mg/dL   Calcium 8.7 (L) 8.9 - 10.3 mg/dL   Total Protein 6.7 6.5 - 8.1 g/dL   Albumin  4.6 3.5 - 5.0 g/dL   AST 15 15 - 41 U/L   ALT 8 0 - 44 U/L   Alkaline Phosphatase 36 (L) 38 - 126 U/L   Total Bilirubin 0.6 0.0 - 1.2 mg/dL   GFR, Estimated >39 >39 mL/min    Comment: (NOTE) Calculated using the CKD-EPI Creatinine Equation (2021)    Anion gap 14 5 - 15    Comment: Performed at [facility], [address]  [place], [place] [address]  Lipase, blood     Status: None   Collection Time: 02/15/24  6:43 PM  Result Value Ref Range   Lipase 24 11 - 51 U/L    Comment: Performed at [facility], [address], [place], [place] [address]  Lipid Profile     Status: Abnormal   Collection Time: 02/29/24  9:04 AM  Result Value Ref Range   Cholesterol, Total 205 (H) 100 - 199 mg/dL   Triglycerides 68 0 - 149 mg/dL   HDL 59 >60 mg/dL   VLDL Cholesterol Cal 12 5 - 40 mg/dL   LDL Chol Calc (NIH) 865 (H) 0 - 99 mg/dL   Chol/HDL Ratio 3.5 0.0 - 4.4 ratio    Comment:                                   T. Chol/HDL Ratio                                             Men  Women                               1/2 Avg.Risk  3.4    3.3                                   Avg.Risk  5.0    4.4                                2X Avg.Risk  9.6    7.1                                3X Avg.Risk 23.4   11.0   CMP14+EGFR     Status: Abnormal   Collection Time: 02/29/24  9:04 AM  Result Value Ref Range   Glucose 93 70 - 99 mg/dL   BUN 11 6 - 24 mg/dL   Creatinine, Ser 9.30 0.57 - 1.00 mg/dL   eGFR 893 >40 fO/fpw/8.26   BUN/Creatinine Ratio 16 9 - 23   Sodium 140 134 - 144 mmol/L   Potassium 4.1 3.5 - 5.2 mmol/L   Chloride 104 96 - 106 mmol/L   CO2 24 20 - 29 mmol/L   Calcium 9.4 8.7 - 10.2 mg/dL   Total Protein 6.1 6.0 - 8.5 g/dL   Albumin 4.2 3.9 - 4.9 g/dL   Globulin, Total 1.9 1.5 - 4.5 g/dL   Bilirubin Total <9.7 0.0 - 1.2 mg/dL   Alkaline Phosphatase 39 (L) 41 - 116 IU/L   AST 15 0 - 40 IU/L   ALT 11 0 - 32 IU/L  Hemoglobin A1c     Status: None    Collection Time: 02/29/24  9:04 AM  Result Value Ref Range   Hgb A1c MFr Bld 5.1 4.8 - 5.6 %    Comment:          Prediabetes: 5.7 - 6.4          Diabetes: >6.4  Glycemic control for adults with diabetes: <7.0    Est. average glucose Bld gHb Est-mCnc 100 mg/dL  TSH     Status: Abnormal   Collection Time: 02/29/24  9:04 AM  Result Value Ref Range   TSH 6.500 (H) 0.450 - 4.500 uIU/mL      Assessment & Plan:  Decrease fatty food intake Referral sent to cardiology Schedule mammogram Order for Dexa scan placed Continue current medications  Problem List Items Addressed This Visit       Other   Hyperlipidemia - Primary   Relevant Orders   Ambulatory referral to Cardiology   Neurogenic bladder   Other Visit Diagnoses       Screening for thyroid  disorder       Relevant Orders   TSH     Osteoporosis screening       Relevant Orders   DG Bone Density       Return in about 4 months (around 07/01/2024) for fasting labs prior, 30 minute appt.   Total time spent: 25 minutes. This time includes review of previous notes and results and patient face to face interaction during today's visit.    Jeoffrey Pollen, NP  03/03/2024   This document may have been prepared by Dragon Voice Recognition software and as such may include unintentional dictation errors.

## 2024-03-04 ENCOUNTER — Encounter: Payer: Self-pay | Admitting: Cardiology

## 2024-03-09 ENCOUNTER — Encounter

## 2024-03-09 ENCOUNTER — Ambulatory Visit
Admission: RE | Admit: 2024-03-09 | Discharge: 2024-03-09 | Disposition: A | Discharge status: Home / Self Care | Source: Ambulatory Visit

## 2024-03-09 DIAGNOSIS — Z1231 Encounter for screening mammogram for malignant neoplasm of breast: Secondary | ICD-10-CM

## 2024-03-15 ENCOUNTER — Encounter: Payer: Self-pay | Admitting: Obstetrics and Gynecology

## 2024-03-15 MED ORDER — FOSFOMYCIN TROMETHAMINE 3 G PO PACK: 3.0000 g | PACK | ORAL | 5 refills | Status: AC

## 2024-03-22 ENCOUNTER — Ambulatory Visit: Admitting: Dermatology

## 2024-04-01 ENCOUNTER — Other Ambulatory Visit

## 2024-04-02 LAB — TSH: TSH: 2.26 u[IU]/mL (ref 0.450–4.500)

## 2024-04-04 ENCOUNTER — Ambulatory Visit: Payer: Self-pay | Admitting: Cardiology

## 2024-04-04 ENCOUNTER — Encounter: Payer: Self-pay | Admitting: Physician Assistant

## 2024-04-04 ENCOUNTER — Ambulatory Visit: Admitting: Physician Assistant

## 2024-04-04 VITALS — BP 98/65

## 2024-04-04 DIAGNOSIS — Z1283 Encounter for screening for malignant neoplasm of skin: Secondary | ICD-10-CM

## 2024-04-04 DIAGNOSIS — L821 Other seborrheic keratosis: Secondary | ICD-10-CM

## 2024-04-04 DIAGNOSIS — L578 Other skin changes due to chronic exposure to nonionizing radiation: Secondary | ICD-10-CM

## 2024-04-04 DIAGNOSIS — D1801 Hemangioma of skin and subcutaneous tissue: Secondary | ICD-10-CM

## 2024-04-04 DIAGNOSIS — Z85820 Personal history of malignant melanoma of skin: Secondary | ICD-10-CM | POA: Diagnosis not present

## 2024-04-04 DIAGNOSIS — W908XXA Exposure to other nonionizing radiation, initial encounter: Secondary | ICD-10-CM

## 2024-04-04 DIAGNOSIS — Z86018 Personal history of other benign neoplasm: Secondary | ICD-10-CM | POA: Diagnosis not present

## 2024-04-04 DIAGNOSIS — L814 Other melanin hyperpigmentation: Secondary | ICD-10-CM | POA: Diagnosis not present

## 2024-04-04 DIAGNOSIS — D239 Other benign neoplasm of skin, unspecified: Secondary | ICD-10-CM | POA: Insufficient documentation

## 2024-04-04 DIAGNOSIS — D229 Melanocytic nevi, unspecified: Secondary | ICD-10-CM

## 2024-04-04 NOTE — Progress Notes (Signed)
 "  Total Body Skin Exam (TBSE) Visit   Subjective  Melissa Pruitt is a 48 y.o. female ESTABLISHED PATIENT who presents for the following: History of malignant melanoma (2021) and multiple dysplastic  nevi (last one 2025).   Total Body Skin Exam (TBSE)  Patient was last evaluated for TBSE by Hester 09/2023.   Patient does not have spots of concern to be evaluated. She does apply sunscreen and/or wears protective coverings.  Hx of Bx: DN mod-severe - R lateral sole - Ex done 11/05/23 by Dr. Corey DN mod - R lateral posterior neck GLENWOOD Hester - 2023 DN mild - R labia - Hester - 2022  DN severe - L proximal lateral thigh superior - Kowalski 2022 Melanoma - R mid plantar sole - Hester 2021  The patient has spots, moles and lesions to be evaluated, some may be new or changing and the patient has concerns that these could be cancer.  The following portions of the chart were reviewed this encounter and updated as appropriate: medications, allergies, medical history  Review of Systems:  No other skin or systemic complaints except as noted in HPI or Assessment and Plan.  Objective  Well appearing patient in no apparent distress; mood and affect are within normal limits.  A full examination was performed including scalp, head, eyes, ears, nose, lips, neck, chest, axillae, abdomen, back, buttocks, bilateral upper extremities, bilateral lower extremities, hands, feet, fingers, toes, fingernails, and toenails. All findings within normal limits unless otherwise noted below.   Relevant physical exam findings are noted in the Assessment and Plan.   Assessment & Plan   History of Dysplastic Nevi - No evidence of recurrence today - Recommend regular full body skin exams - Recommend daily broad spectrum sunscreen SPF 30+ to sun-exposed areas, reapply every 2 hours as needed.  - Call if any new or changing lesions are noted between office visits  HISTORY OF MALIGNANT MELANOMA - No evidence  of recurrence today - No lymphadenopathy - Recommend regular full body skin exams - Recommend daily broad spectrum sunscreen SPF 30+ to sun-exposed areas, reapply every 2 hours as needed.  - Call if any new or changing lesions are noted between office visits  LENTIGINES, SEBORRHEIC KERATOSES, HEMANGIOMAS - Benign normal skin lesions - Benign-appearing - Call for any changes  BENIGN MELANOCYTIC NEVI - Tan-brown and/or pink-flesh-colored symmetric macules and papules - Benign appearing on exam today - Observation - Call clinic for new or changing moles - Recommend daily use of broad spectrum spf 30+ sunscreen to sun-exposed areas.   MILD ACTINIC DAMAGE - Chronic condition, secondary to cumulative UV/sun exposure - diffuse scaly erythematous macules with underlying dyspigmentation - Recommend daily broad spectrum sunscreen SPF 30+ to sun-exposed areas, reapply every 2 hours as needed.  - Staying in the shade or wearing long sleeves, sun glasses (UVA+UVB protection) and wide brim hats (4-inch brim around the entire circumference of the hat) are also recommended for sun protection.  - Call for new or changing lesions.   SKIN CANCER SCREENING PERFORMED TODAY.   MULTIPLE DYSPLASTIC NEVI   ACTINIC SKIN DAMAGE   CHERRY ANGIOMA   HISTORY OF MELANOMA   LENTIGINES   MULTIPLE BENIGN NEVI   HISTORY OF DYSPLASTIC NEVUS   SCREENING EXAM FOR SKIN CANCER   SEBORRHEIC KERATOSIS   Return in about 9 months (around 01/03/2025) for TBSE.   Documentation: I have reviewed the above documentation for accuracy and completeness, and I agree with the above.  I,  Shirron Loma, CMA II, am acting as scribe for:  Zamyra Allensworth K, PA-C "

## 2024-04-04 NOTE — Progress Notes (Signed)
Pt informed

## 2024-04-04 NOTE — Patient Instructions (Signed)

## 2024-04-05 ENCOUNTER — Other Ambulatory Visit: Payer: Self-pay | Admitting: Cardiology

## 2024-04-25 ENCOUNTER — Encounter: Payer: Self-pay | Admitting: *Deleted

## 2024-04-26 ENCOUNTER — Ambulatory Visit: Payer: Self-pay | Admitting: Obstetrics

## 2024-04-26 NOTE — Addendum Note (Signed)
 Addended by: ELANA SOTERO RAMAN on: 04/26/2024 10:33 AM   Modules accepted: Orders

## 2024-04-30 IMAGING — CR DG CHEST 2V
2 series · 2 of 2 positions shown · non-contrast
Comparison: 07/19/2016

CLINICAL DATA: Palpitations

EXAM:
CHEST - 2 VIEW

[chest pa]
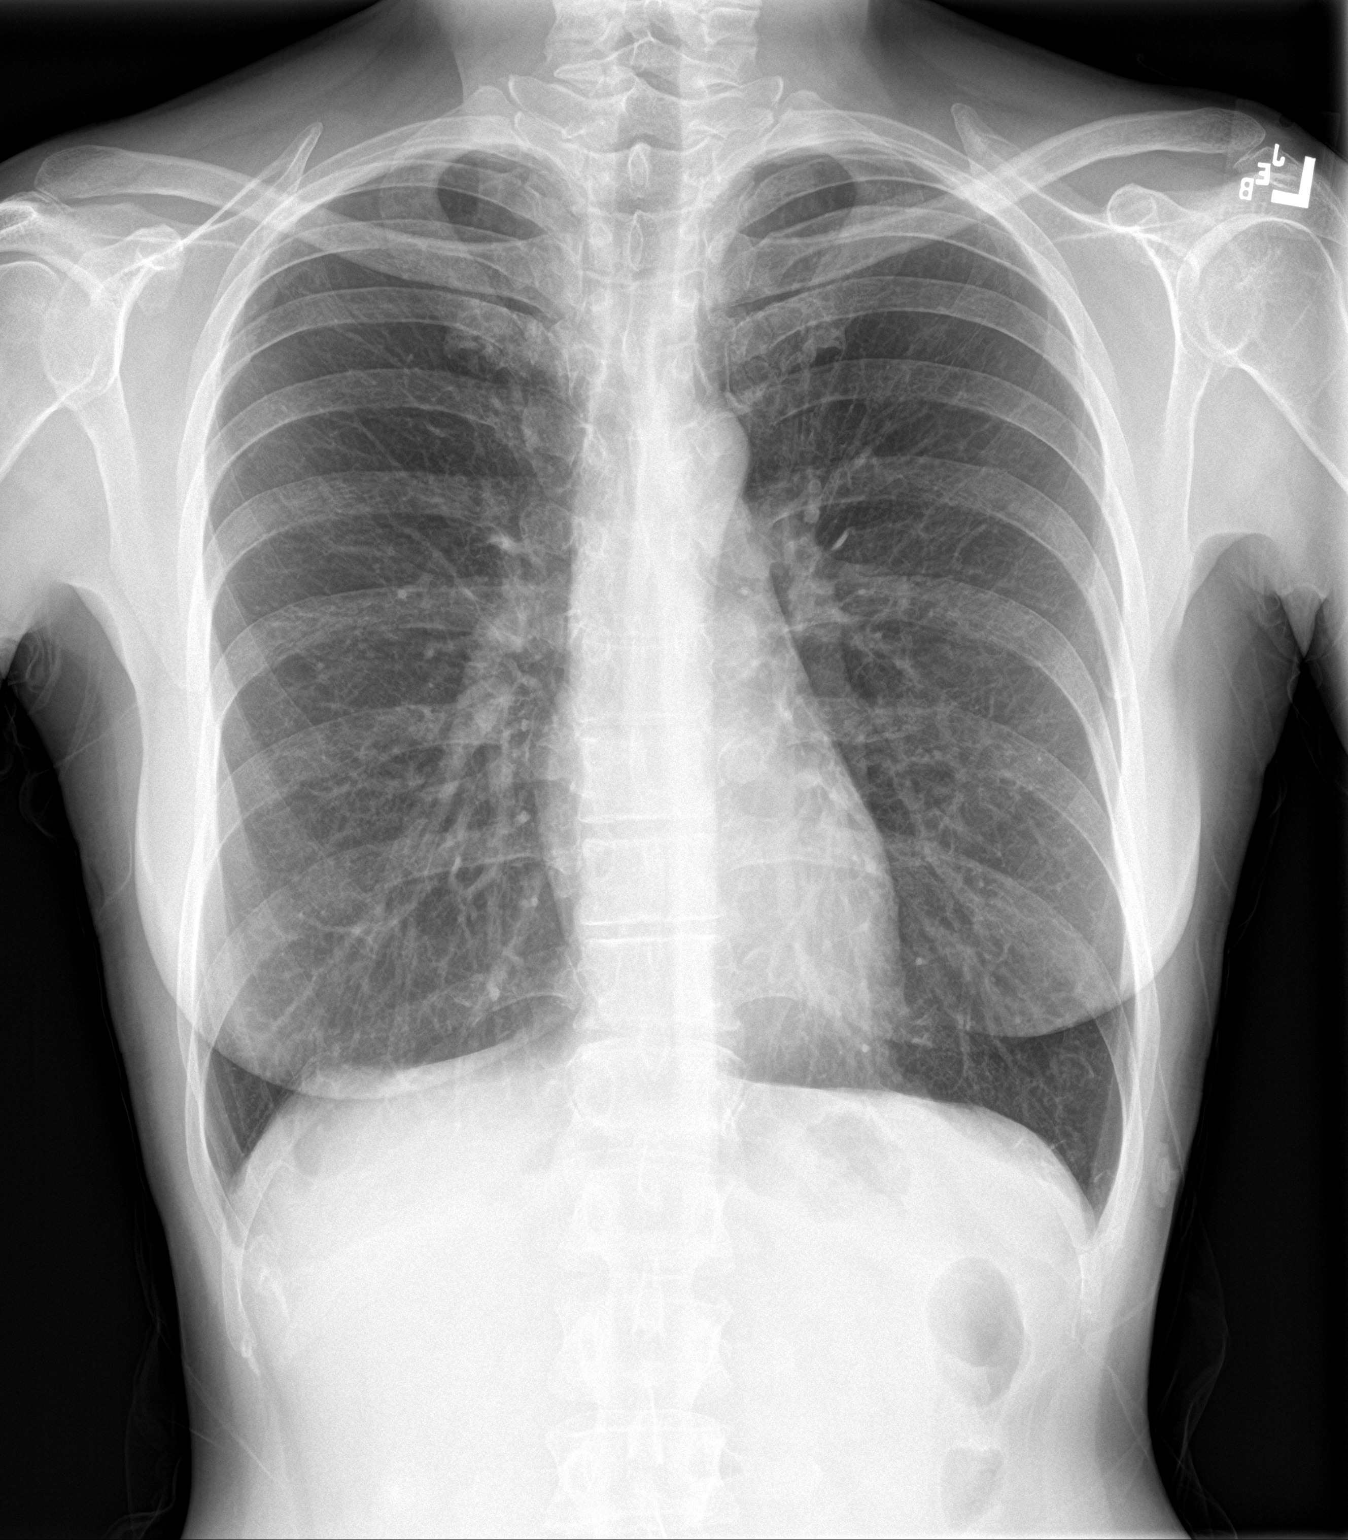

[chest lat]
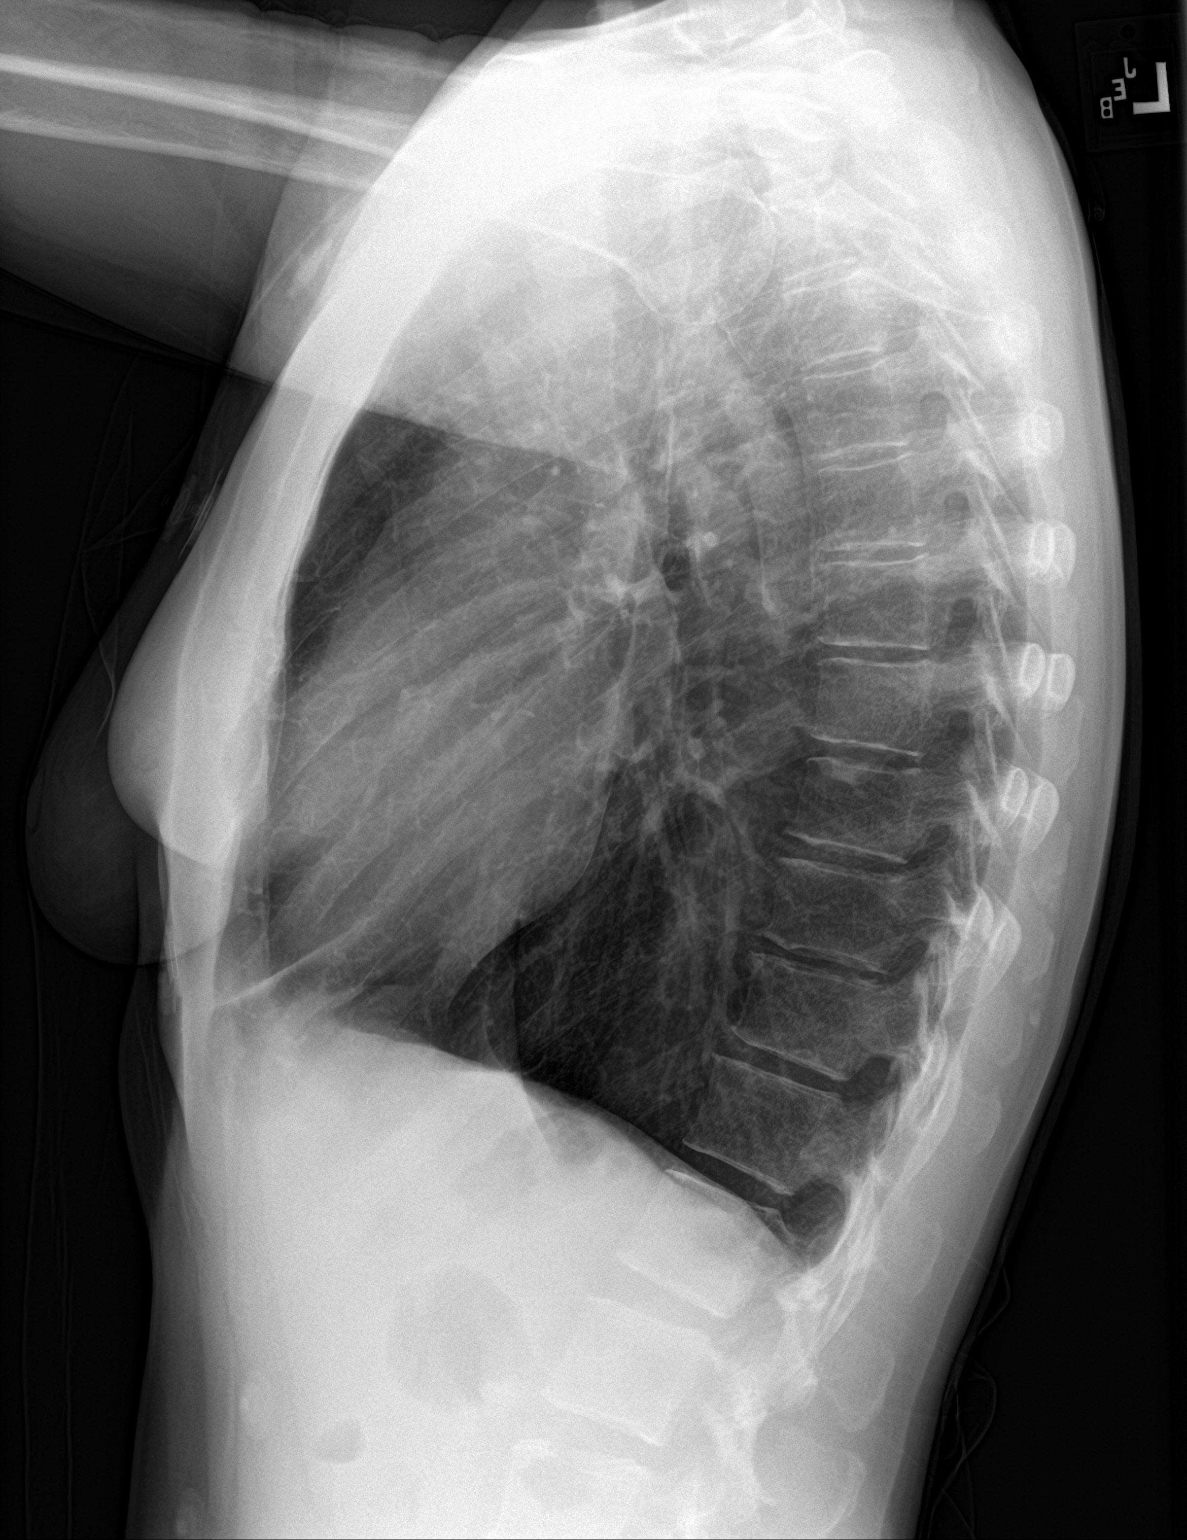

[2 of 2 positions shown; findings below may reference images not displayed]

FINDINGS: Cardiac and mediastinal contours are within normal limits. No focal
pulmonary opacity. No pleural effusion or pneumothorax. No acute
osseous abnormality.
IMPRESSION: No acute cardiopulmonary process.

## 2024-04-30 IMAGING — MR MR CERVICAL SPINE WO/W CM
5 of 8 series · 19 of 48 positions shown · IV contrast (gadavist)
Comparison: 11/20/2018

CLINICAL DATA: Myelopathy, acute, cervical spine

EXAM:
MRI CERVICAL SPINE WITHOUT AND WITH CONTRAST
TECHNIQUE: Multiplanar and multiecho pulse sequences of the cervical spine, to
include the craniocervical junction and cervicothoracic junction,
were obtained without and with intravenous contrast.
CONTRAST:  5.5mL GADAVIST GADOBUTROL 1 MMOL/ML IV SOLN

[Series 11: STIR · sagittal · 3.0mm · 0.35mm/px · 1 of 17 slices shown]
[im 1/17]
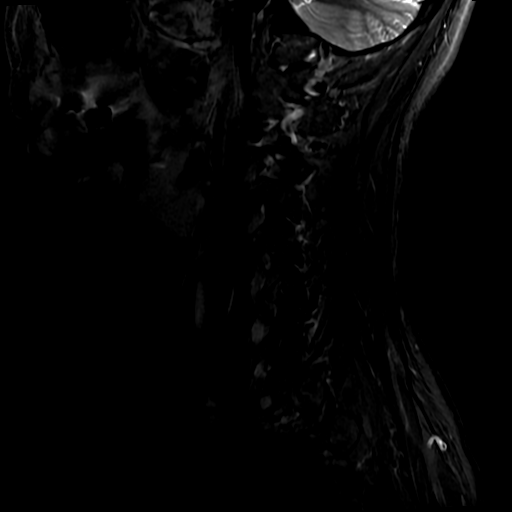

[Series 13: T2 · axial · 3.0mm · 0.35mm/px · z∈[-172,-55]mm · 6 of 35 slices shown]
[im 1/35]
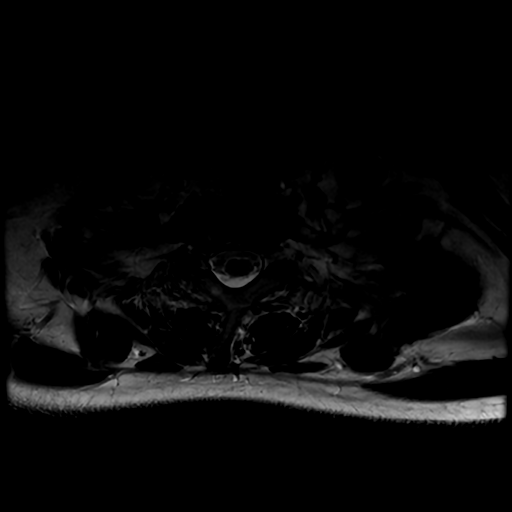
[im 7/35]
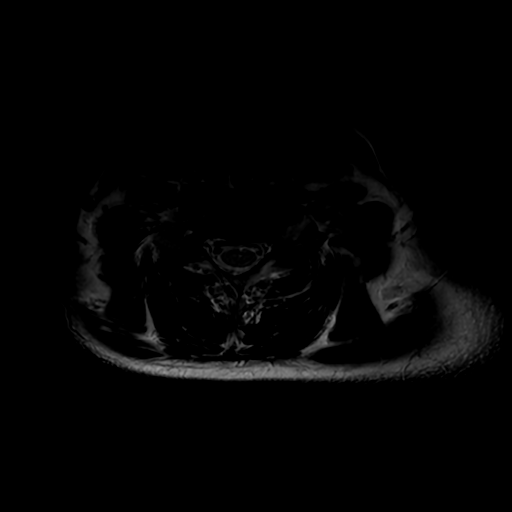
[im 14/35]
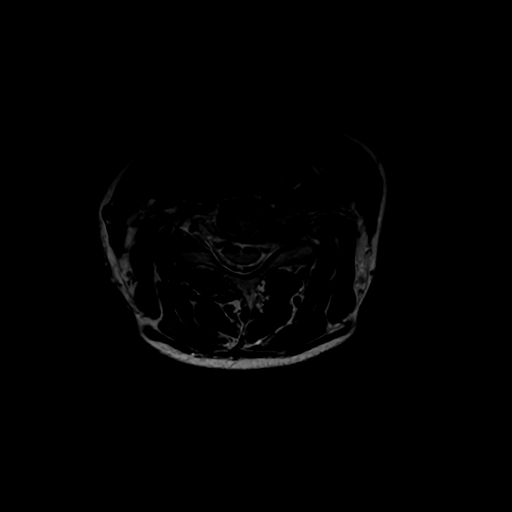
[im 21/35]
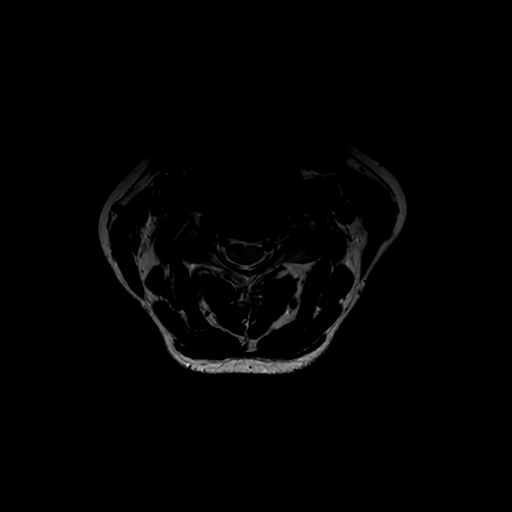
[im 28/35]
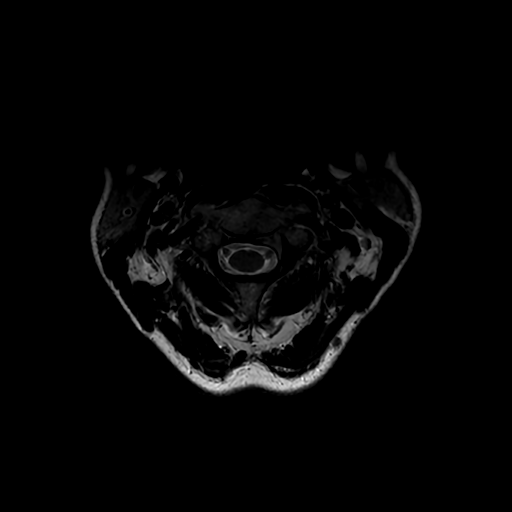
[im 35/35]
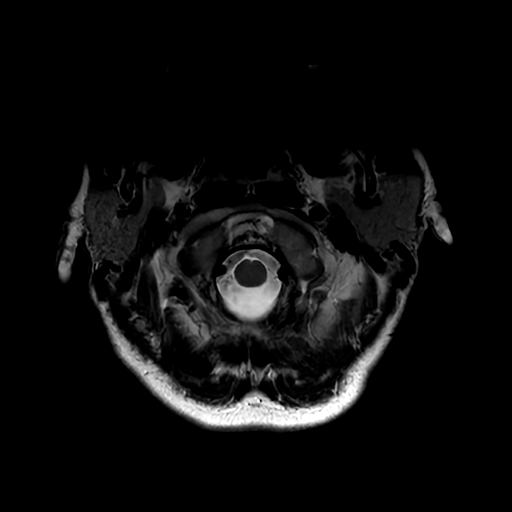

[Series 14: T1 · axial · non-contrast · 3.0mm · 0.35mm/px · z∈[-172,-55]mm · 6 of 36 slices shown]
[im 1/36]
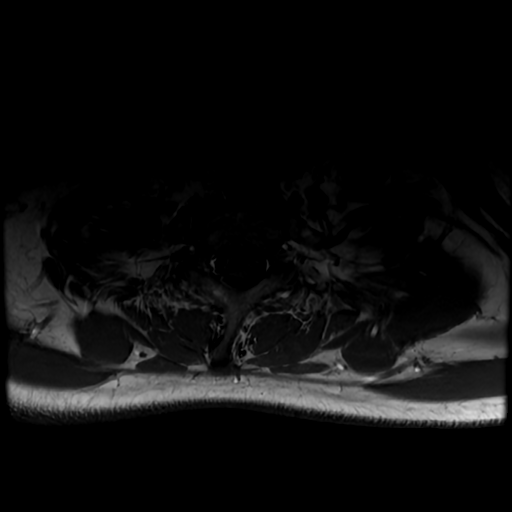
[im 8/36]
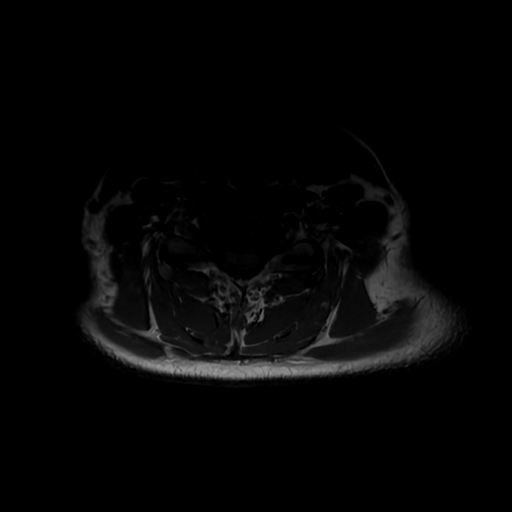
[im 15/36]
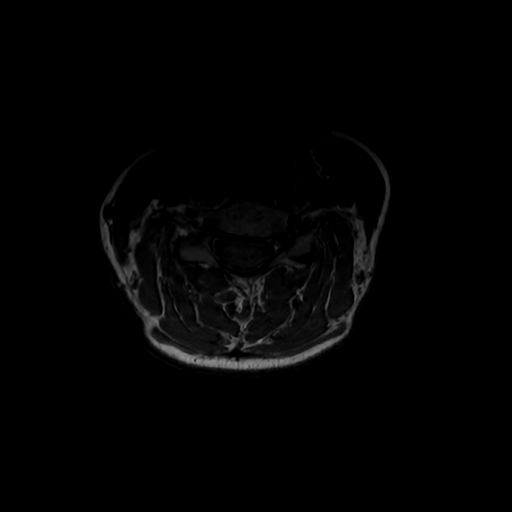
[im 22/36]
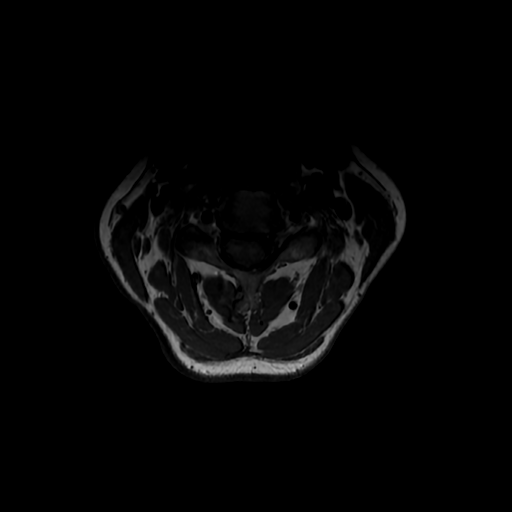
[im 29/36]
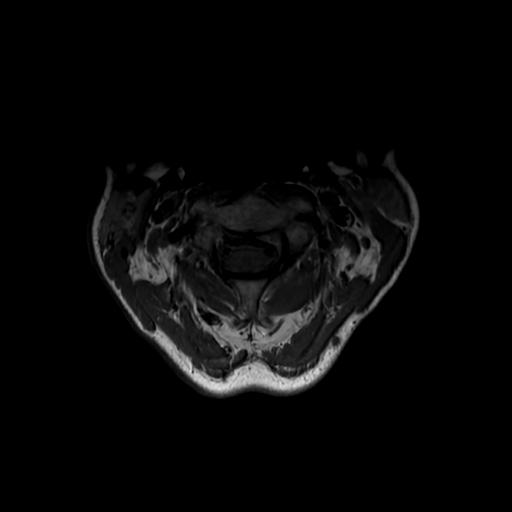
[im 36/36]
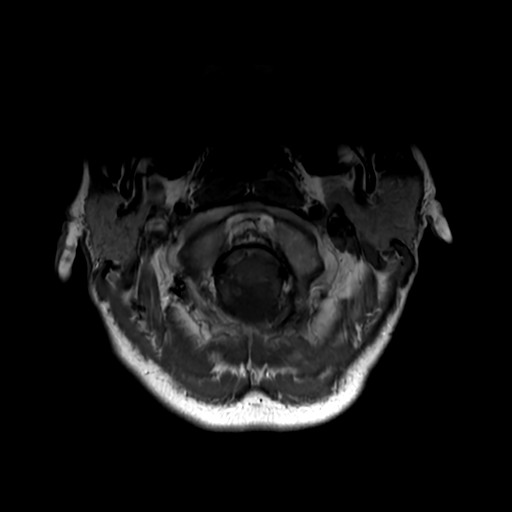

[Series 15: T2 post-contrast · sagittal · 3.0mm · 0.35mm/px · 3 of 17 slices shown]
[im 1/17]
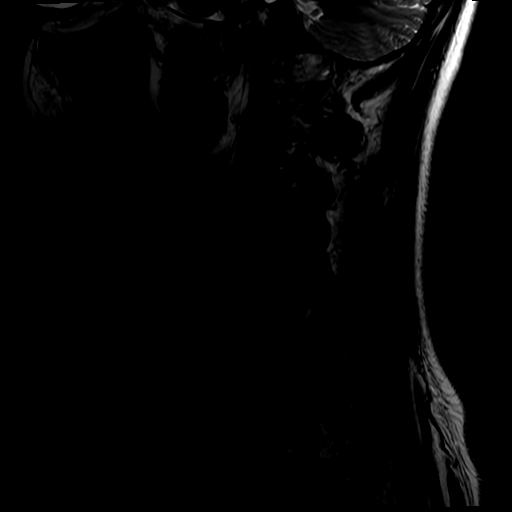
[im 9/17]
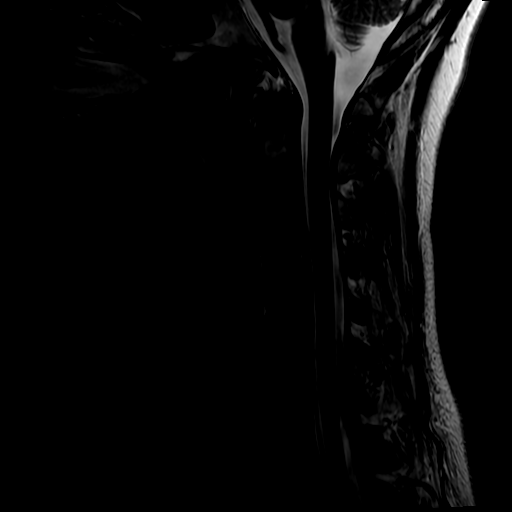
[im 17/17]
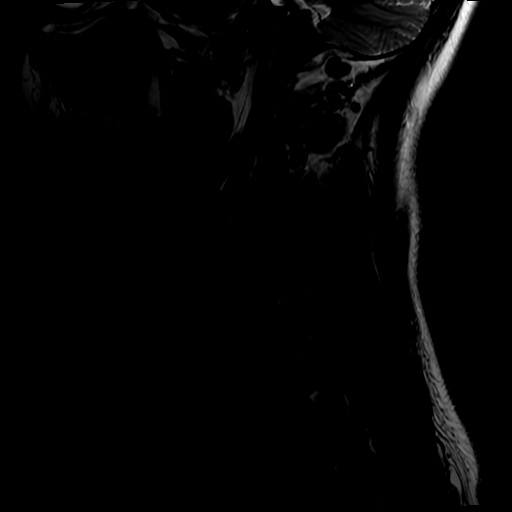

[Series 16: T1 fat-sat post-contrast · sagittal · 3.0mm · 0.35mm/px · 3 of 17 slices shown]
[im 1/17]
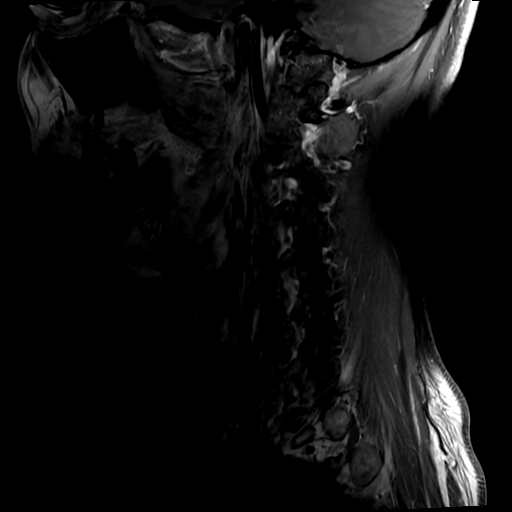
[im 9/17]
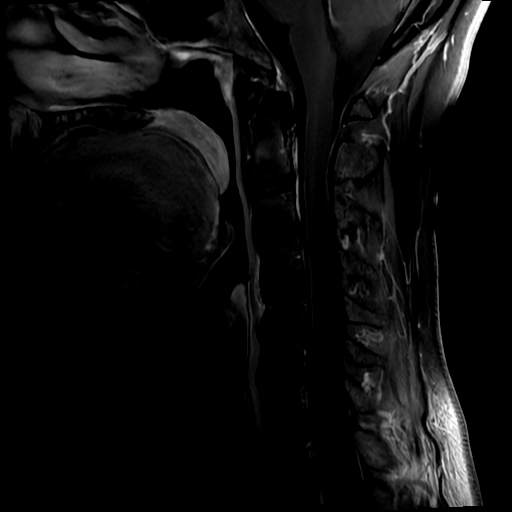
[im 17/17]
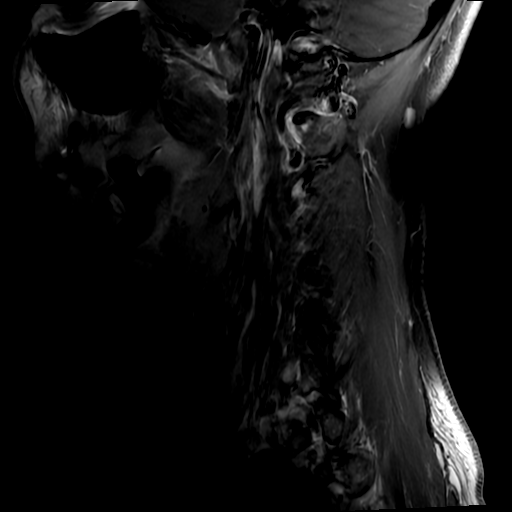

[19 of 48 positions shown; findings below may reference images not displayed]

FINDINGS: Alignment: Physiologic.

Vertebrae: No fracture, evidence of discitis, or bone lesion.

Cord: Normal signal and morphology. No abnormal postcontrast
enhancement.

Posterior Fossa, vertebral arteries, paraspinal tissues: Negative.

Disc levels:

Negative. Intervertebral disc heights of the cervical spine are
preserved. No significant disc protrusion. Facet joints within
normal limits. No foraminal or canal stenosis at any level.
IMPRESSION: Unremarkable pre and post-contrast MRI of the cervical spine.

## 2024-04-30 IMAGING — MR MR HEAD WO/W CM
6 of 12 series · 26 of 48 positions shown · IV contrast (gadavist)
Comparison: 06/18/2018 MRI, correlation is also made with CT head
08/19/2021

CLINICAL DATA: Loss of vision

EXAM:
MRI HEAD WITHOUT AND WITH CONTRAST
TECHNIQUE: Multiplanar, multiecho pulse sequences of the brain and surrounding
structures were obtained without and with intravenous contrast.
CONTRAST:  5.5mL GADAVIST GADOBUTROL 1 MMOL/ML IV SOLN

[Series 2: DWI · axial · 3.0mm · 0.94mm/px · z∈[-67,+73]mm · 8 of 98 slices shown (1 of 2)]
[im 1/98]
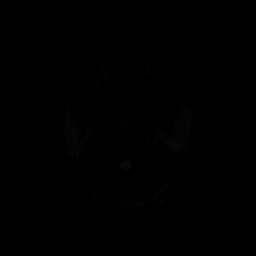
[im 14/98]
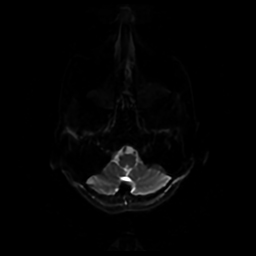
[im 28/98]
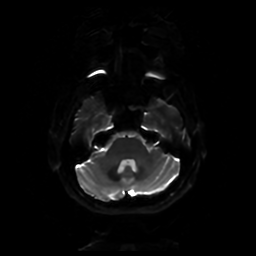
[im 42/98]
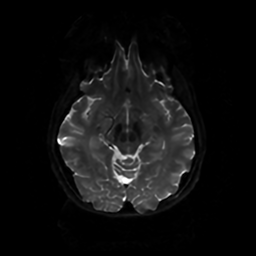
[im 56/98]
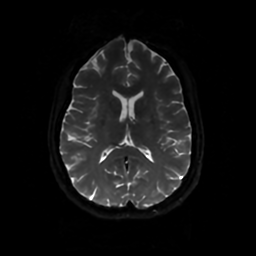
[im 70/98]
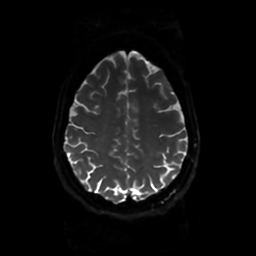
[im 84/98]
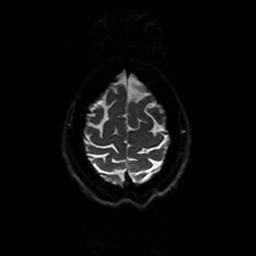
[im 98/98]
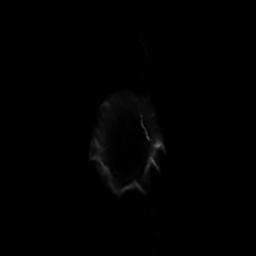

[Series 3: DWI · coronal · 4.0mm · 0.94mm/px · 6 of 72 slices shown (2 of 2)]
[im 1/72]
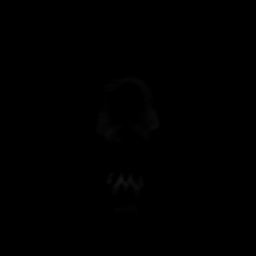
[im 15/72]
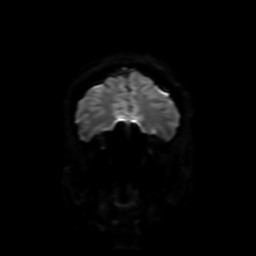
[im 29/72]
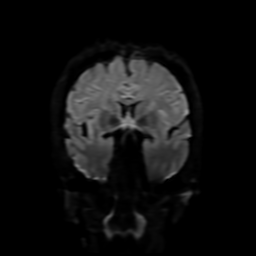
[im 43/72]
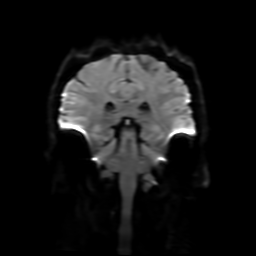
[im 57/72]
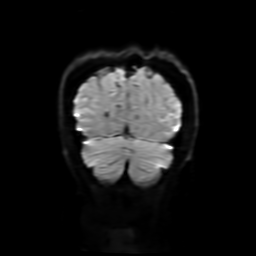
[im 72/72]
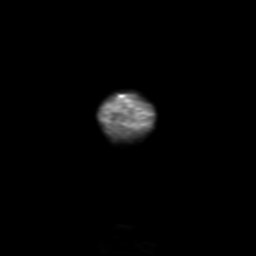

[Series 4: FLAIR · sagittal · 5.0mm · 0.23mm/px · 2 of 22 slices shown (1 of 2)]
[im 1/22]
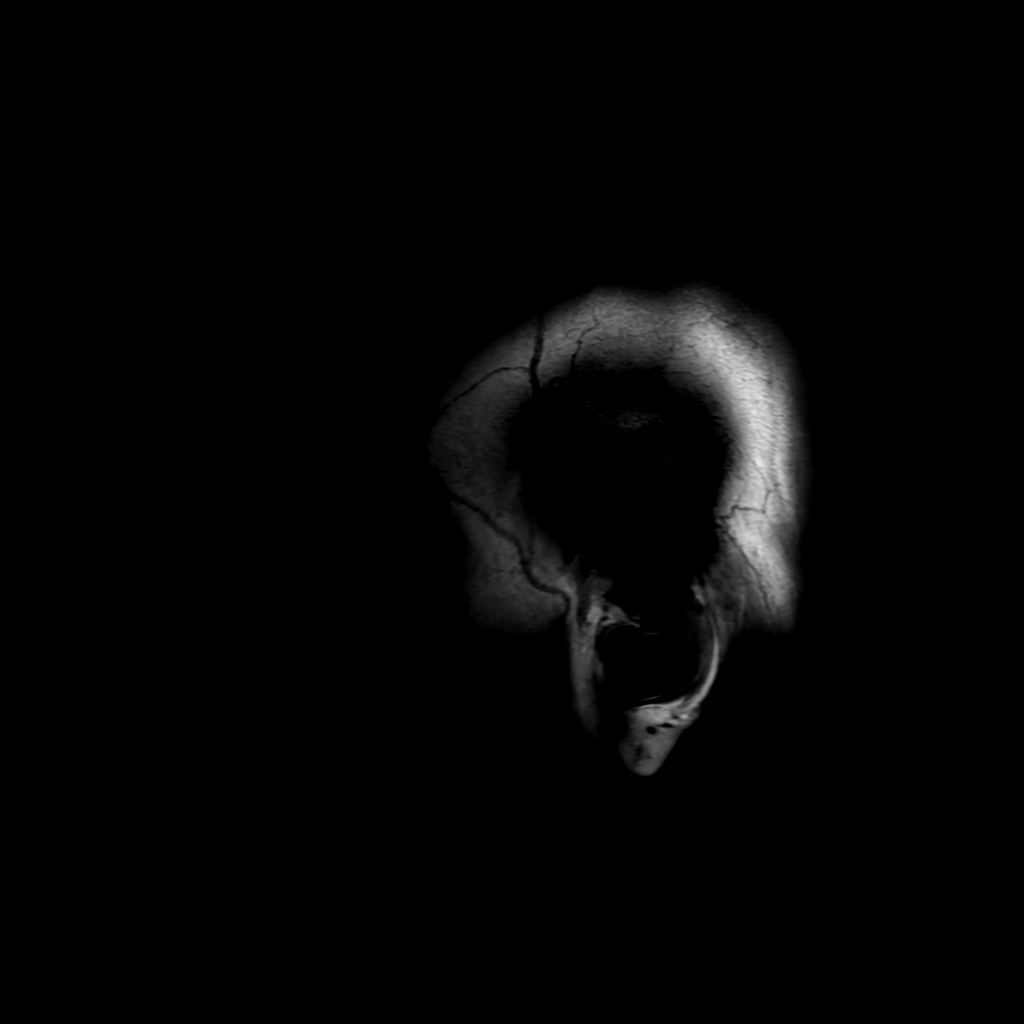
[im 22/22]
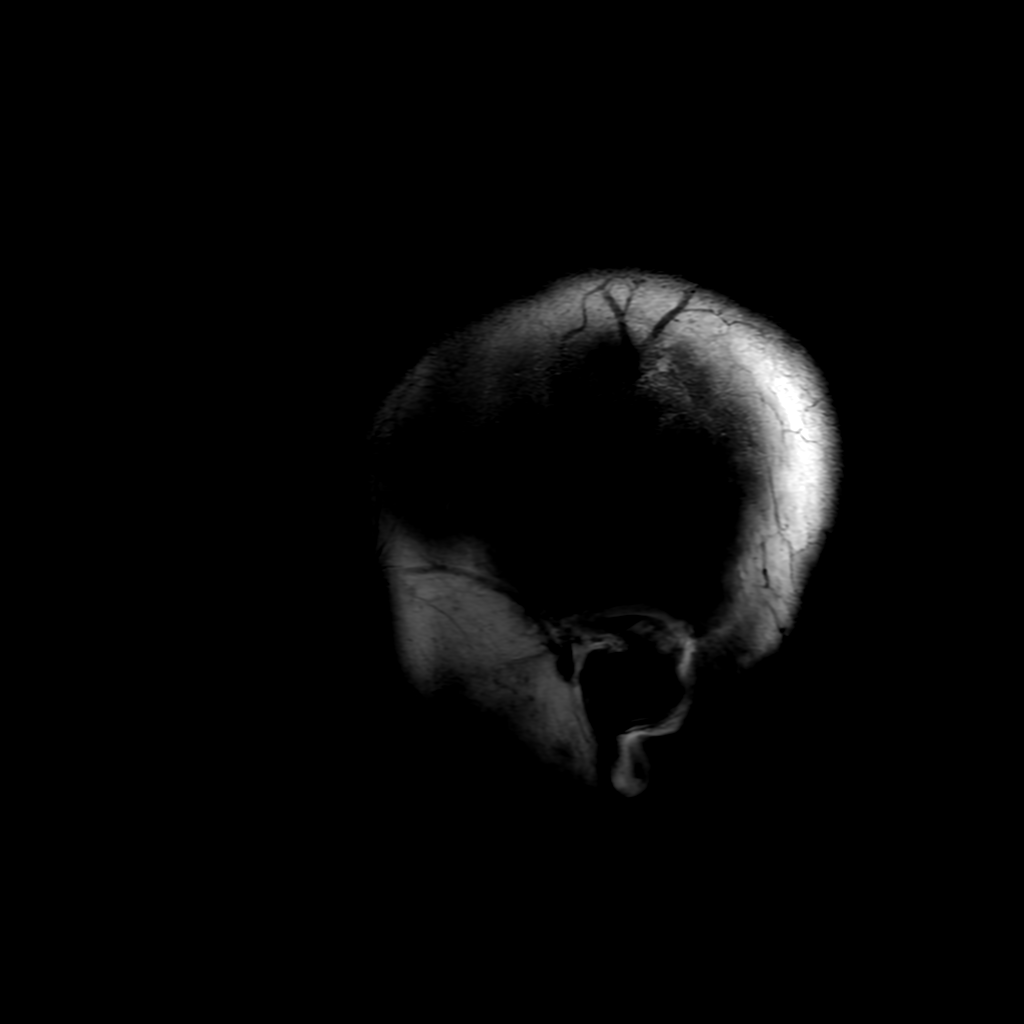

[Series 6: FLAIR · axial · 4.0mm · 0.45mm/px · z∈[-72,+68]mm · 3 of 34 slices shown (2 of 2)]
[im 1/34]
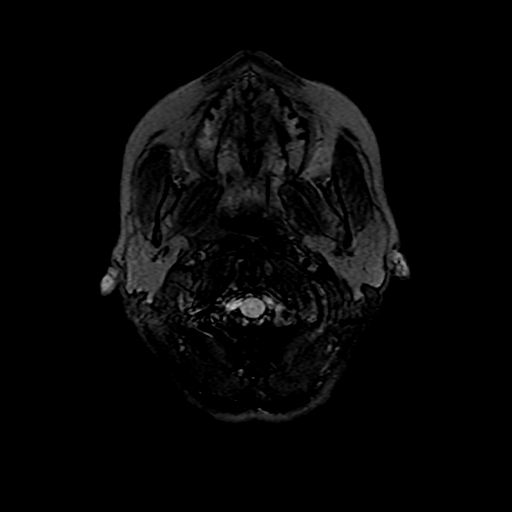
[im 17/34]
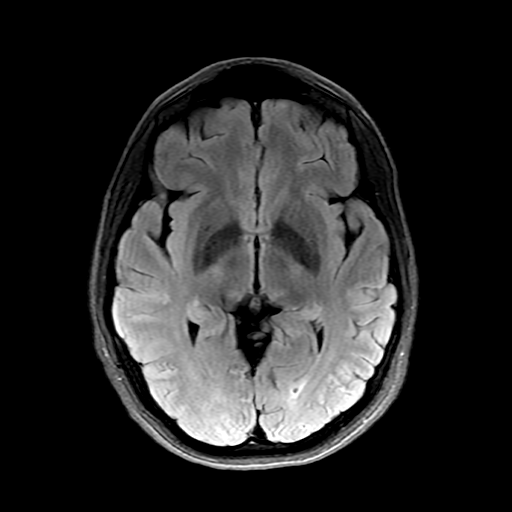
[im 34/34]
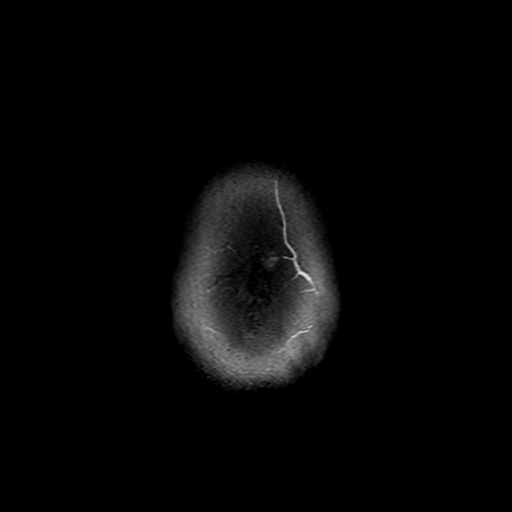

[Series 250: ADC · axial · 3.0mm · 0.94mm/px · z∈[-67,+73]mm · 4 of 49 slices shown (1 of 2)]
[im 1/49]
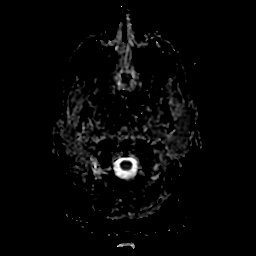
[im 17/49]
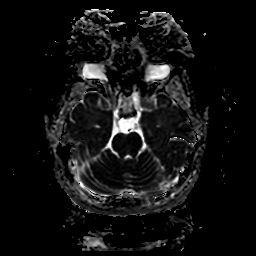
[im 33/49]
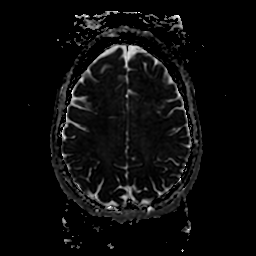
[im 49/49]
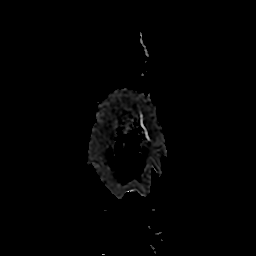

[Series 350: ADC · coronal · 4.0mm · 0.94mm/px · 3 of 36 slices shown (2 of 2)]
[im 1/36]
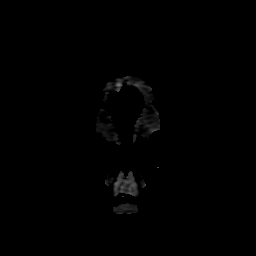
[im 18/36]
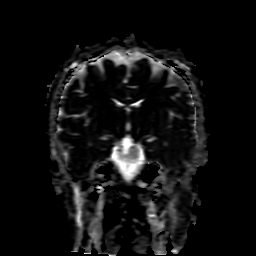
[im 36/36]
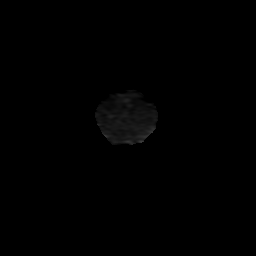

[26 of 48 positions shown; findings below may reference images not displayed]

FINDINGS: Brain: No restricted diffusion to suggest acute or subacute infarct.
No acute hemorrhage, mass, mass effect, or midline shift. No
abnormal parenchymal or meningeal enhancement. No hydrocephalus or
extra-axial collection. No significant abnormal T2 hyperintense
signal in the periventricular or juxtacortical white matter.
Pituitary is normal in size and signal. Normal craniocervical
junction.

Vascular: Normal arterial flow voids. Patent venous sinuses on
postcontrast imaging.

Skull and upper cervical spine: Normal marrow signal.

Sinuses/Orbits: Negative.

Other: The mastoids are well aerated.
IMPRESSION: No acute intracranial process. No evidence of acute or subacute
infarct or demyelinating disease. No etiology is seen for the
patient's vision loss.

## 2024-05-06 ENCOUNTER — Other Ambulatory Visit (HOSPITAL_COMMUNITY): Payer: Self-pay | Admitting: Cardiology

## 2024-05-06 DIAGNOSIS — R0789 Other chest pain: Secondary | ICD-10-CM

## 2024-05-11 ENCOUNTER — Encounter: Payer: Self-pay | Admitting: Obstetrics and Gynecology

## 2024-05-13 ENCOUNTER — Other Ambulatory Visit: Payer: Self-pay | Admitting: Obstetrics and Gynecology

## 2024-05-13 DIAGNOSIS — R10A1 Flank pain, right side: Secondary | ICD-10-CM

## 2024-05-16 ENCOUNTER — Ambulatory Visit (HOSPITAL_COMMUNITY)

## 2024-05-23 ENCOUNTER — Ambulatory Visit (HOSPITAL_COMMUNITY)

## 2024-06-02 ENCOUNTER — Ambulatory Visit (HOSPITAL_COMMUNITY)

## 2024-06-08 ENCOUNTER — Institutional Professional Consult (permissible substitution) (HOSPITAL_BASED_OUTPATIENT_CLINIC_OR_DEPARTMENT_OTHER): Admitting: Internal Medicine

## 2024-06-30 ENCOUNTER — Ambulatory Visit: Admitting: Cardiology

## 2025-01-03 ENCOUNTER — Ambulatory Visit: Admitting: Physician Assistant
